# Patient Record
Sex: Female | Born: 1955
Health system: Southern US, Community
[De-identification: ages and names within clinical notes are randomized; demographics above are authoritative.]

## PROBLEM LIST (undated history)

## (undated) DIAGNOSIS — I10 Essential (primary) hypertension: Secondary | ICD-10-CM

## (undated) DIAGNOSIS — H539 Unspecified visual disturbance: Secondary | ICD-10-CM

## (undated) DIAGNOSIS — K529 Noninfective gastroenteritis and colitis, unspecified: Secondary | ICD-10-CM

## (undated) DIAGNOSIS — D6851 Activated protein C resistance: Secondary | ICD-10-CM

## (undated) DIAGNOSIS — G629 Polyneuropathy, unspecified: Secondary | ICD-10-CM

## (undated) DIAGNOSIS — I890 Lymphedema, not elsewhere classified: Secondary | ICD-10-CM

## (undated) DIAGNOSIS — J45909 Unspecified asthma, uncomplicated: Secondary | ICD-10-CM

## (undated) DIAGNOSIS — N189 Chronic kidney disease, unspecified: Secondary | ICD-10-CM

## (undated) DIAGNOSIS — G4733 Obstructive sleep apnea (adult) (pediatric): Secondary | ICD-10-CM

## (undated) DIAGNOSIS — J449 Chronic obstructive pulmonary disease, unspecified: Secondary | ICD-10-CM

## (undated) DIAGNOSIS — I82A19 Acute embolism and thrombosis of unspecified axillary vein: Secondary | ICD-10-CM

## (undated) DIAGNOSIS — Z22322 Carrier or suspected carrier of Methicillin resistant Staphylococcus aureus: Secondary | ICD-10-CM

## (undated) DIAGNOSIS — I350 Nonrheumatic aortic (valve) stenosis: Secondary | ICD-10-CM

## (undated) DIAGNOSIS — Z9989 Dependence on other enabling machines and devices: Secondary | ICD-10-CM

## (undated) HISTORY — DX: Essential (primary) hypertension: I10

## (undated) HISTORY — DX: Carrier or suspected carrier of methicillin resistant Staphylococcus aureus: Z22.322

## (undated) HISTORY — DX: Noninfective gastroenteritis and colitis, unspecified: K52.9

## (undated) HISTORY — DX: Other disorders of calcium metabolism: E83.59

## (undated) HISTORY — DX: Unspecified asthma, uncomplicated: J45.909

## (undated) HISTORY — DX: Chronic obstructive pulmonary disease, unspecified: J44.9

## (undated) HISTORY — DX: Lymphedema, not elsewhere classified: I89.0

## (undated) HISTORY — DX: Obstructive sleep apnea (adult) (pediatric): G47.33

## (undated) HISTORY — DX: Acute embolism and thrombosis of unspecified axillary vein: I82.A19

## (undated) HISTORY — DX: Polyneuropathy, unspecified: G62.9

## (undated) HISTORY — DX: Unspecified visual disturbance: H53.9

## (undated) HISTORY — DX: Activated protein C resistance: D68.51

## (undated) HISTORY — DX: Chronic kidney disease, unspecified: N18.9

## (undated) HISTORY — PX: AV FISTULA PLACEMENT: SHX1204

## (undated) HISTORY — PX: TONSILLECTOMY: SUR1361

## (undated) HISTORY — DX: Dependence on other enabling machines and devices: Z99.89

## (undated) HISTORY — PX: APPLICATION OF WOUND VAC: SHX5189

## (undated) HISTORY — PX: OTHER SURGICAL HISTORY: SHX169

---

## 2010-01-04 DIAGNOSIS — N008 Acute nephritic syndrome with other morphologic changes: Secondary | ICD-10-CM | POA: Insufficient documentation

## 2010-01-04 DIAGNOSIS — N186 End stage renal disease: Secondary | ICD-10-CM | POA: Insufficient documentation

## 2013-08-18 DIAGNOSIS — Z992 Dependence on renal dialysis: Secondary | ICD-10-CM | POA: Insufficient documentation

## 2013-08-18 DIAGNOSIS — N186 End stage renal disease: Secondary | ICD-10-CM | POA: Insufficient documentation

## 2013-08-19 DIAGNOSIS — J449 Chronic obstructive pulmonary disease, unspecified: Secondary | ICD-10-CM | POA: Insufficient documentation

## 2013-08-19 DIAGNOSIS — J45909 Unspecified asthma, uncomplicated: Secondary | ICD-10-CM | POA: Insufficient documentation

## 2013-08-19 DIAGNOSIS — G4730 Sleep apnea, unspecified: Secondary | ICD-10-CM | POA: Insufficient documentation

## 2013-08-19 DIAGNOSIS — J4489 Other specified chronic obstructive pulmonary disease: Secondary | ICD-10-CM | POA: Insufficient documentation

## 2015-03-01 DIAGNOSIS — N186 End stage renal disease: Secondary | ICD-10-CM | POA: Insufficient documentation

## 2015-03-01 DIAGNOSIS — I120 Hypertensive chronic kidney disease with stage 5 chronic kidney disease or end stage renal disease: Secondary | ICD-10-CM | POA: Insufficient documentation

## 2015-03-27 ENCOUNTER — Other Ambulatory Visit: Payer: Self-pay | Admitting: Family Medicine

## 2015-03-27 ENCOUNTER — Other Ambulatory Visit (HOSPITAL_COMMUNITY)
Admission: RE | Admit: 2015-03-27 | Discharge: 2015-03-27 | Disposition: A | Discharge status: Home / Self Care | Payer: Medicare Other | Source: Ambulatory Visit | Attending: Family Medicine | Admitting: Family Medicine

## 2015-03-27 ENCOUNTER — Ambulatory Visit: Payer: Self-pay | Admitting: Neurology

## 2015-03-27 DIAGNOSIS — Z1151 Encounter for screening for human papillomavirus (HPV): Secondary | ICD-10-CM | POA: Insufficient documentation

## 2015-03-27 DIAGNOSIS — Z124 Encounter for screening for malignant neoplasm of cervix: Secondary | ICD-10-CM | POA: Diagnosis present

## 2015-03-29 ENCOUNTER — Ambulatory Visit (INDEPENDENT_AMBULATORY_CARE_PROVIDER_SITE_OTHER): Payer: Medicare Other | Admitting: Neurology

## 2015-03-29 ENCOUNTER — Encounter: Payer: Self-pay | Admitting: Neurology

## 2015-03-29 VITALS — BP 134/88 | HR 64 | Resp 16 | Ht 60.0 in | Wt 253.0 lb

## 2015-03-29 DIAGNOSIS — G2581 Restless legs syndrome: Secondary | ICD-10-CM | POA: Insufficient documentation

## 2015-03-29 DIAGNOSIS — G4700 Insomnia, unspecified: Secondary | ICD-10-CM | POA: Diagnosis not present

## 2015-03-29 DIAGNOSIS — G629 Polyneuropathy, unspecified: Secondary | ICD-10-CM | POA: Diagnosis not present

## 2015-03-29 DIAGNOSIS — G4733 Obstructive sleep apnea (adult) (pediatric): Secondary | ICD-10-CM | POA: Diagnosis not present

## 2015-03-29 DIAGNOSIS — R269 Unspecified abnormalities of gait and mobility: Secondary | ICD-10-CM | POA: Insufficient documentation

## 2015-03-29 DIAGNOSIS — D6851 Activated protein C resistance: Secondary | ICD-10-CM | POA: Insufficient documentation

## 2015-03-29 HISTORY — DX: Obstructive sleep apnea (adult) (pediatric): G47.33

## 2015-03-29 MED ORDER — GABAPENTIN 100 MG PO CAPS: 100.0000 mg | ORAL_CAPSULE | Freq: Four times a day (QID) | ORAL | Status: DC

## 2015-03-29 MED ORDER — ROPINIROLE HCL 2 MG PO TABS: 2.0000 mg | ORAL_TABLET | Freq: Every day | ORAL | Status: DC

## 2015-03-29 NOTE — Progress Notes (Signed)
[facility]  PATIENT: Whitney Chavez DOB: [birth year]  REFERRING DOCTOR OR PCP:  none SOURCE: patient and records from [facility]  _________________________________   HISTORICAL  CHIEF COMPLAINT:  Chief Complaint  Patient presents with  . Sleep Apnea    Sts. she is compliant with cpap.  She repeated her sleep study last yr. and got a new machine at that time.  She gets  her supplies thru [facility].  Sts. RLS and neuropathy are fairly well controlled with Requip and Gabapentin.  She remains on dialysis for renal failure, she sts. secondary to vit. d use./fim  . Restless Leg Syndrome  . Peripheral Neuropathy    HISTORY OF PRESENT ILLNESS:  Whitney Chavez is a 59 year old woman who I have previously seen at [facility] for  Obstructive sleep apnea , insomnia, restless leg syndrome and polyneuropathy.  OSA:  She is sleeping well with CPAP  +10 cm most nights. A download in 2015 showed AHI equals 0.8 with a compliance of 73%. A PSG study February 2015 showed an AHI equals 44.7 with very poor sleep efficiency. Due to the poor sleep efficiency she had AutoPap as an outpatient to avoid another night in the laboratory.   She does not note much sleepiness but may lay nod off for a few minutes around 3 pm and then does well the rest of the day.     Insomnia:  She has sleep maintenance more than sleep onset insomnia. She takes 100 mg gabapentin x 2-3 (occ 4) pills/day. Insomnia is worse night after hemodialysis.  Restless leg syndrome/polyneuropathy:     Restless leg syndrome is usually worse during dialysis days.  Restless leg syndrome has improved on ropinirole and gabapentin.  Ropinirole makes her sleepy so she takes right before bedtime.  She reports numbness up to her ankles and has been stable the past year. . A nerve conduction study/EMG February 2015 showed a length dependent sensory motor axonal polyneuropathy with active features in the lower  leg history she had a superimposed mild-to-moderate right carpal tunnel syndrome.   She has special orthotics that help her foot pain some.    Other: She is on hemodialysis for CRF due to vitamin D toxicity. She has had anemia associated with ESRD.  REVIEW OF SYSTEMS: Constitutional: No fevers, chills, sweats, or change in appetite Eyes: No visual changes, double vision, eye pain Ear, nose and throat: No hearing loss, ear pain, nasal congestion, sore throat Cardiovascular: No chest pain, palpitations Respiratory: No shortness of breath at rest or with exertion.   No wheezes GastrointestinaI: No nausea, vomiting, diarrhea, abdominal pain, fecal incontinence Genitourinary: No dysuria, urinary retention or frequency.  No nocturia. Musculoskeletal: No neck pain, back pain Integumentary: No rash, pruritus, skin lesions Neurological: as above Psychiatric: No depression at this time.  No anxiety Endocrine: No palpitations, diaphoresis, change in appetite, change in weigh or increased thirst Hematologic/Lymphatic: No anemia, purpura, petechiae. Allergic/Immunologic: No itchy/runny eyes, nasal congestion, recent allergic reactions, rashes  ALLERGIES: Allergies  Allergen Reactions  . Other     Unable to take birth control pills due to clotting disorder/fim    HOME MEDICATIONS:  Current outpatient prescriptions:  .  fluticasone (FLONASE) 50 MCG/ACT nasal spray, Place into both nostrils daily., Disp: , Rfl:  .  gabapentin (NEURONTIN) 100 MG capsule, , Disp: , Rfl:  .  metoprolol (LOPRESSOR) 50 MG tablet, , Disp: , Rfl:  .  RENVELA 800 MG tablet, , Disp: , Rfl:  .  rOPINIRole (REQUIP) 2 MG tablet, , Disp: , Rfl:  .  sulfaSALAzine (AZULFIDINE) 500 MG tablet, , Disp: , Rfl:  .  warfarin (COUMADIN) 2 MG tablet, , Disp: , Rfl:  .  warfarin (COUMADIN) 5 MG tablet, , Disp: , Rfl:   PAST MEDICAL HISTORY: Past Medical History  Diagnosis Date  . Obstructive sleep apnea 03/29/2015  . OSA on  CPAP   . Hypertension   . Chronic kidney disease   . Neuropathy   . Vision abnormalities   . MRSA (methicillin resistant staph aureus) culture positive     PAST SURGICAL HISTORY: Past Surgical History  Procedure Laterality Date  . Lumbar decompression fusion    . Av fistula placement Left   . Tonsillectomy      FAMILY HISTORY: Family History  Problem Relation Age of Onset  . Diabetes Mother   . Congestive Heart Failure Mother   . COPD Mother   . Stroke Mother   . Neuropathy Mother   . Heart disease Father   . Stroke Father   . Cancer Brother   . Diabetes Brother   . Diabetes Brother   . Alcohol abuse Brother     SOCIAL HISTORY:  History   Social History  . Marital Status: Single    Spouse Name: N/A  . Number of Children: N/A  . Years of Education: N/A   Occupational History  . Not on file.   Social History Main Topics  . Smoking status: Former Research scientist (life sciences)  . Smokeless tobacco: Not on file  . Alcohol Use: No  . Drug Use: No  . Sexual Activity: Not on file   Other Topics Concern  . Not on file   Social History Narrative  . No narrative on file     PHYSICAL EXAM  Filed Vitals:   03/29/15 1310  BP: 134/88  Pulse: 64  Resp: 16  Height: 5' (1.524 m)  Weight: 253 lb (114.76 kg)    Body mass index is 49.41 kg/(m^2).   General: The patient is well-developed and well-nourished and in no acute distress  Skin: Extremities are with mild edema.  Musculoskeletal:  Back is nontender  Neurologic Exam  Mental status: The patient is alert and oriented x 3 at the time of the examination. The patient has apparent normal recent and remote memory, with an apparently normal attention span and concentration ability.   Speech is normal.  Cranial nerves: Extraocular movements are full. Pupils are equal, round, and reactive to light and accomodation.  Facial symmetry is present. There is good facial sensation to soft touch bilaterally.Facial strength is normal.   Trapezius and sternocleidomastoid strength is normal. No dysarthria is noted.  The tongue is midline, and the patient has symmetric elevation of the soft palate. No obvious hearing deficits are noted.  Motor:  Muscle bulk is normal.   Tone is normal. Strength is  5 / 5 in all 4 extremities except 4/5 EHL and 4+/5 tib ant. .   Sensory: Sensory testing is intact to pinprick, soft touch and vibration sensation in the arms but she has reduced vibration sensation in her knees (absent in feet) and reduced touch/temp in hte lower legs and feet.     Coordination: Cerebellar testing reveals good finger-nose-finger   bilaterally.  Gait and station: Station is normal.   Gait is mildly wide and better if she looks down. Tandem gait is poor. Romberg is positive.   Reflexes: Deep tendon reflexes are 1+ in arms and absent  in knees.        DIAGNOSTIC DATA (LABS, IMAGING, TESTING) - I reviewed patient records, labs, notes, testing and imaging myself where available.     ASSESSMENT AND PLAN  Obstructive sleep apnea  Insomnia  Polyneuropathy  Restless leg  Gait disturbance   1.    Gabapentin 2 400 mg daily  for restless leg, polyneuropathy and insomnia.   Ropinirole for restless legs. 2.   Continue CPAP +10 cm. 3.    Orthotic for both feet for neuropathy..   Prescription provided.  return in 1 year or sooner if there are new or worsening neurologic symptoms.   Shyquan Stallbaumer A. Felecia Shelling, MD, PhD [id number], [id number] PM Certified in Neurology, Clinical Neurophysiology, Sleep Medicine, Pain Medicine and Neuroimaging  [facility] [address], [place], [place] [address] [phone]

## 2015-03-30 LAB — CYTOLOGY - PAP

## 2015-08-16 ENCOUNTER — Ambulatory Visit: Payer: Medicare Other

## 2015-08-16 ENCOUNTER — Ambulatory Visit (HOSPITAL_BASED_OUTPATIENT_CLINIC_OR_DEPARTMENT_OTHER): Payer: Medicare Other | Admitting: Hematology & Oncology

## 2015-08-16 ENCOUNTER — Encounter: Payer: Self-pay | Admitting: Hematology & Oncology

## 2015-08-16 ENCOUNTER — Other Ambulatory Visit: Payer: Medicare Other

## 2015-08-16 VITALS — BP 131/63 | HR 64 | Temp 97.9°F | Resp 16 | Ht 60.0 in | Wt 254.0 lb

## 2015-08-16 DIAGNOSIS — D6851 Activated protein C resistance: Secondary | ICD-10-CM

## 2015-08-16 DIAGNOSIS — Z7901 Long term (current) use of anticoagulants: Secondary | ICD-10-CM | POA: Diagnosis not present

## 2015-08-16 DIAGNOSIS — N186 End stage renal disease: Secondary | ICD-10-CM | POA: Diagnosis not present

## 2015-08-16 DIAGNOSIS — I82A19 Acute embolism and thrombosis of unspecified axillary vein: Secondary | ICD-10-CM | POA: Insufficient documentation

## 2015-08-16 DIAGNOSIS — Z86718 Personal history of other venous thrombosis and embolism: Secondary | ICD-10-CM | POA: Diagnosis not present

## 2015-08-16 DIAGNOSIS — Z992 Dependence on renal dialysis: Secondary | ICD-10-CM

## 2015-08-16 DIAGNOSIS — D6852 Prothrombin gene mutation: Secondary | ICD-10-CM

## 2015-08-16 HISTORY — DX: Acute embolism and thrombosis of unspecified axillary vein: I82.A19

## 2015-08-16 NOTE — Progress Notes (Signed)
Referral MD  Reason for Referral: Recurrent thromboembolic disease secondary to factor V Leiden mutation (heterozygous) and prothrombin II gene mutation.  Chief Complaint  Patient presents with  . OTHER    New Patient  : I need a colonoscopy and need to have my Coumadin adjusted.  HPI: Ms. Sherouse is a very charming 59 year old white female. She is originally from [place]. She is from [place], [place].   She moved down here about a month or so ago. She has end-stage renal disease. She is on hemodialysis. She says this was from taking vitamin D.   She has a history of ulcerative colitis. She is followed by Dr. Paulita Fujita. He wants to plan a surveillance colonoscopy for her. Back she is on lifelong anticoagulation because of her multiple thrombophilic abnormalities.  She was followed up in [place], very closely by Dr. Karlyne Greenspan at the [facility].  Her history dates back to age 25 years old when she was placed onto oral contraceptives. Interestingly enough, at age 37, she got pregnant and gave birth to a healthy baby girl. The birth was full-term area and she's never been pregnant since. She's never had any miscarriages.  When she is placed on oral contraceptives a year later, she developed pulmonary emboli. She was given Coumadin for 6 months.  Her second episode occurred in 2007 when she developed a spontaneous thrombus in the left lower leg.  Then I think in 2014, she developed a thrombus in the right axillary vein. She was then placed on lifelong anticoagulation.  She goes onto heparin whenever she needs a procedure performed. She goes on heparin for about 5 days and then after the procedure, continues the heparin is restarts her Coumadin.  She is doing well with dialysis. She gets hemodialysis. She has an AV fistula in place.  She's had no proximal with leg pain or swelling. She's had no nausea vomiting. She's had no bleeding. Her ulcerative colitis has not flared up. Per she's had no  chest wall pain. She's had no cough. There is no hemoptysis.  She has her Coumadin managed at [facility]. Because she lives close to our office, it was felt that she could have the peri-procedure Coumadin/heparin managed by [place].  She has had no problems with weight loss or weight gain. She has had no rashes. She has had no fever.  Overall, her performance status is ECOG 0.                 Past Medical History  Diagnosis Date  . Obstructive sleep apnea 03/29/2015  . OSA on CPAP   . Hypertension   . Chronic kidney disease   . Neuropathy ([place])   . Vision abnormalities   . MRSA (methicillin resistant staph aureus) culture positive   :  Past Surgical History  Procedure Laterality Date  . Lumbar decompression fusion    . Av fistula placement Left   . Tonsillectomy    :   Current outpatient prescriptions:  .  acetaminophen (TYLENOL) 325 MG tablet, Take 650 mg by mouth., Disp: , Rfl:  .  fluticasone (FLONASE) 50 MCG/ACT nasal spray, Place into both nostrils daily., Disp: , Rfl:  .  gabapentin (NEURONTIN) 100 MG capsule, Take 1 capsule (100 mg total) by mouth 4 (four) times daily., Disp: 360 capsule, Rfl: 3 .  metoprolol (LOPRESSOR) 50 MG tablet, , Disp: , Rfl:  .  omeprazole (PRILOSEC) 20 MG capsule, Take 20 mg by mouth., Disp: , Rfl:  .  RENVELA 800 MG tablet, , Disp: , Rfl:  .  rOPINIRole (REQUIP) 2 MG tablet, Take 1 tablet (2 mg total) by mouth at bedtime., Disp: 90 tablet, Rfl: 3 .  sulfaSALAzine (AZULFIDINE) 500 MG tablet, , Disp: , Rfl:  .  warfarin (COUMADIN) 2 MG tablet, , Disp: , Rfl:  .  warfarin (COUMADIN) 5 MG tablet, , Disp: , Rfl: :  :  Allergies  Allergen Reactions  . Other     Unable to take birth control pills due to clotting disorder/fim  :  Family History  Problem Relation Age of Onset  . Diabetes Mother   . Congestive Heart Failure Mother   . COPD Mother   . Stroke Mother   . Neuropathy Mother   . Heart disease Father   . Stroke  Father   . Cancer Brother   . Diabetes Brother   . Diabetes Brother   . Alcohol abuse Brother   :  Social History   Social History  . Marital Status: Single    Spouse Name: N/A  . Number of Children: N/A  . Years of Education: N/A   Occupational History  . Not on file.   Social History Main Topics  . Smoking status: Former Research scientist (life sciences)  . Smokeless tobacco: Not on file  . Alcohol Use: No  . Drug Use: No  . Sexual Activity: Not on file   Other Topics Concern  . Not on file   Social History Narrative  :  Pertinent items are noted in HPI.  Exam: @IPVITALS @  obese white female in no obvious distress. Her vital signs show a temperature of 97.9. Pulse 64. Blood pressure 131/63. Weight is 254 pounds. Head and neck exam shows no ocular or oral lesions. There are no palpable cervical or supraclavicular lymph nodes. Lungs are clear. Cardiac exam regular rate and rhythm with no murmurs, rubs or bruits. Abdomen is soft. She is obese. She has good bowel sounds. There is no fluid wave. There is no palpable liver or spleen tip. Back exam shows no tenderness over the spine, ribs or hips. Extremities shows no clubbing, cyanosis or edema. She has a AV fistula in the left arm. No venous cord is noted in the legs. She has good range motion of her joints. Skin exam shows no rashes, ecchymoses or petechia. Neurological exam shows no focal neurological deficits.   No results for input(s): WBC, HGB, HCT, PLT in the last 72 hours. No results for input(s): NA, K, CL, CO2, GLUCOSE, BUN, CREATININE, CALCIUM in the last 72 hours.  Blood smear review:  None  Pathology: None     Assessment and Plan:  Ms. Towsend is a very charming 59 year old white female. She has multiple thrombophilic conditions. She's had recurrent thromboembolisms. She is on lifelong anticoagulation.  She has done well with heparin in the past. She is limited to what she can take because of the renal failure and hemodialysis.  We will  go ahead and give her a prescription for heparin at 5000 units subcutaneous 3 times a day for 5 days before the procedure and then start the day after the procedure for 5 days. I told her to restart the Coumadin the day after her procedure.  This is what she has done in the past and it seems to have worked pretty well for her.  Otherwise, I do still think we have to see her back. Her Coumadin is being managed by a physician at [facility]. We will be  1 having to help out in any other way if she has problems in the future.  I spent about 45 minutes with her.  It was a lot of fun talking with her about [place].

## 2016-01-17 DIAGNOSIS — Z7901 Long term (current) use of anticoagulants: Secondary | ICD-10-CM | POA: Insufficient documentation

## 2016-01-17 DIAGNOSIS — D6851 Activated protein C resistance: Secondary | ICD-10-CM | POA: Insufficient documentation

## 2016-02-19 ENCOUNTER — Encounter: Payer: Self-pay | Admitting: *Deleted

## 2016-03-27 ENCOUNTER — Ambulatory Visit (INDEPENDENT_AMBULATORY_CARE_PROVIDER_SITE_OTHER): Payer: Medicare Other | Admitting: Neurology

## 2016-03-27 ENCOUNTER — Encounter: Payer: Self-pay | Admitting: Neurology

## 2016-03-27 VITALS — BP 118/60 | HR 78 | Resp 20 | Ht 61.0 in | Wt 256.0 lb

## 2016-03-27 DIAGNOSIS — G4733 Obstructive sleep apnea (adult) (pediatric): Secondary | ICD-10-CM | POA: Diagnosis not present

## 2016-03-27 DIAGNOSIS — G629 Polyneuropathy, unspecified: Secondary | ICD-10-CM | POA: Diagnosis not present

## 2016-03-27 DIAGNOSIS — G4700 Insomnia, unspecified: Secondary | ICD-10-CM | POA: Diagnosis not present

## 2016-03-27 DIAGNOSIS — G2581 Restless legs syndrome: Secondary | ICD-10-CM | POA: Diagnosis not present

## 2016-03-27 DIAGNOSIS — R269 Unspecified abnormalities of gait and mobility: Secondary | ICD-10-CM

## 2016-03-27 MED ORDER — ROPINIROLE HCL 2 MG PO TABS: 2.0000 mg | ORAL_TABLET | Freq: Every day | ORAL | Status: DC

## 2016-03-27 MED ORDER — GABAPENTIN 100 MG PO CAPS: 100.0000 mg | ORAL_CAPSULE | Freq: Four times a day (QID) | ORAL | Status: DC

## 2016-03-27 MED ORDER — ROPINIROLE HCL 0.5 MG PO TABS: ORAL_TABLET | ORAL | Status: DC

## 2016-03-27 NOTE — Progress Notes (Signed)
[facility]  PATIENT: Whitney Chavez DOB: 1956/08/31  REFERRING DOCTOR OR PCP:  none SOURCE: patient and records from [facility]  _________________________________   HISTORICAL  CHIEF COMPLAINT:  Chief Complaint  Patient presents with  . Sleep Apnea  . RLS  . Peripheral Neuropathy    HISTORY OF PRESENT ILLNESS:  Whitney Chavez is a 60 year old woman with Obstructive sleep apnea , insomnia, restless leg syndrome and polyneuropathy.    She is having more difficulty getting around her house to perform activities of daily living and would benefit from a power vehicle such as a scooter.  Mobility:   She has gait disturbance due to polyneuropathy and has occasional falls.  She is currently using a cane but is still having significant issues and occasional falls.    Due to foot weakness and fatigue, a cane or walker does not meet mobility needs to do ADL's in her home.    Due to obesity and fatigue, she can't use a self propelled wheelchair.   Therefore a power vehicle is necessary.    A scooter will be able to help her move inside of her home from room to room to do her ADLs such as laundry, meal preparation toileting and other self care which are now difficult to perform.    She will be able to operate a scooter within her home.  OSA:  She is sleeping well with CPAP  +10 cm every night.   A PSG study February 2015 showed an AHI equals 44.7 with very poor sleep efficiency. Due to the poor sleep efficiency she had AutoPap as an outpatient to avoid another night in the laboratory.   She does not note much sleepiness but may lay nod off for a few minutes around 3 pm and then does well the rest of the day.     Insomnia:  She has sleep maintenance more than sleep onset insomnia. She takes 400 mg gabapentin with benefit. Insomnia is worse night after hemodialysis.  Restless leg syndrome/polyneuropathy:     Restless leg syndrome is a little worse, especially on dialysis  days (has CRF).  In the past RLS was only after HD or in the evening and now it occurs during the day.   Restless leg syndrome has improved on ropinirole and gabapentin (400 mg at night).  Ropinirole makes her sleepy so she takes right before bedtime.  She has polyneuropathy with numbness up to her ankles and has been stable the past year. . A nerve conduction study/EMG February 2015 showed a length dependent sensory motor axonal polyneuropathy with active features in the lower leg history she had a superimposed mild-to-moderate right carpal tunnel syndrome.   She has special orthotics that help her foot pain some.    Other: She is on hemodialysis for CRF due to vitamin D toxicity. She has had anemia associated with ESRD.  REVIEW OF SYSTEMS: Constitutional: No fevers, chills, sweats, or change in appetite Eyes: No visual changes, double vision, eye pain Ear, nose and throat: No hearing loss, ear pain, nasal congestion, sore throat Cardiovascular: No chest pain, palpitations Respiratory: No shortness of breath at rest or with exertion.   No wheezes GastrointestinaI: No nausea, vomiting, diarrhea, abdominal pain, fecal incontinence Genitourinary: No dysuria, urinary retention or frequency.  No nocturia. Musculoskeletal: No neck pain, back pain Integumentary: No rash, pruritus, skin lesions Neurological: as above Psychiatric: No depression at this time.  No anxiety Endocrine: No palpitations, diaphoresis, change in appetite, change in weigh  or increased thirst Hematologic/Lymphatic: No anemia, purpura, petechiae. Allergic/Immunologic: No itchy/runny eyes, nasal congestion, recent allergic reactions, rashes  ALLERGIES: Allergies  Allergen Reactions  . Estrogens Other (See Comments)    Blood clots  . Other     Unable to take birth control pills due to clotting disorder/fim  . Prednisone Other (See Comments)    Facial edema     HOME MEDICATIONS:  Current outpatient prescriptions:  .   acetaminophen (TYLENOL) 325 MG tablet, Take 650 mg by mouth., Disp: , Rfl:  .  gabapentin (NEURONTIN) 100 MG capsule, Take 1 capsule (100 mg total) by mouth 4 (four) times daily., Disp: 360 capsule, Rfl: 3 .  omeprazole (PRILOSEC) 20 MG capsule, Take 20 mg by mouth., Disp: , Rfl:  .  OVER THE COUNTER MEDICATION, , Disp: , Rfl:  .  RENVELA 800 MG tablet, , Disp: , Rfl:  .  rOPINIRole (REQUIP) 2 MG tablet, Take 1 tablet (2 mg total) by mouth at bedtime., Disp: 90 tablet, Rfl: 3 .  sulfaSALAzine (AZULFIDINE) 500 MG tablet, , Disp: , Rfl:  .  warfarin (COUMADIN) 2 MG tablet, , Disp: , Rfl:  .  warfarin (COUMADIN) 5 MG tablet, , Disp: , Rfl:  .  metoprolol (LOPRESSOR) 50 MG tablet, Reported on 03/27/2016, Disp: , Rfl:   PAST MEDICAL HISTORY: Past Medical History  Diagnosis Date  . Obstructive sleep apnea 03/29/2015  . OSA on CPAP   . Hypertension   . Chronic kidney disease   . Neuropathy ([place])   . Vision abnormalities   . MRSA (methicillin resistant staph aureus) culture positive   . Axillary vein thrombosis 08/16/2015    PAST SURGICAL HISTORY: Past Surgical History  Procedure Laterality Date  . Lumbar decompression fusion    . Av fistula placement Left   . Tonsillectomy      FAMILY HISTORY: Family History  Problem Relation Age of Onset  . Diabetes Mother   . Congestive Heart Failure Mother   . COPD Mother   . Stroke Mother   . Neuropathy Mother   . Heart disease Father   . Stroke Father   . Cancer Brother   . Diabetes Brother   . Diabetes Brother   . Alcohol abuse Brother     SOCIAL HISTORY:  Social History   Social History  . Marital Status: Single    Spouse Name: N/A  . Number of Children: N/A  . Years of Education: N/A   Occupational History  . Not on file.   Social History Main Topics  . Smoking status: Former Research scientist (life sciences)  . Smokeless tobacco: Not on file  . Alcohol Use: No  . Drug Use: No  . Sexual Activity: Not on file   Other Topics Concern  . Not on  file   Social History Narrative     PHYSICAL EXAM  Filed Vitals:   03/27/16 1106  BP: 118/60  Pulse: 78  Resp: 20  Height: 5\' 1"  (1.549 m)  Weight: 256 lb (116.121 kg)    Body mass index is 48.4 kg/(m^2).   General: The patient is well-developed and well-nourished and in no acute distress  Skin: Extremities are with mild edema.  Musculoskeletal:  Back is nontender  Neurologic Exam  Mental status: The patient is alert and oriented x 3 at the time of the examination. The patient has apparent normal recent and remote memory, with an apparently normal attention span and concentration ability.   Speech is normal.  Cranial nerves: Extraocular  movements are full. Pupils are equal, round, and reactive to light and accomodation.  Facial symmetry is present. There is good facial sensation to soft touch bilaterally.Facial strength is normal.  Trapezius and sternocleidomastoid strength is normal. No dysarthria is noted.  The tongue is midline, and the patient has symmetric elevation of the soft palate. No obvious hearing deficits are noted.  Motor:  Muscle bulk is normal.   Tone is normal. Strength is  5 / 5 in all 4 extremities except 4/5 EHL and 4+/5 tib ant. .   Sensory: Sensory testing is intact to pinprick, soft touch and vibration sensation in the arms but she has reduced vibration sensation in her knees (absent in feet) and reduced touch/temp in hte lower legs and feet.     Coordination: Cerebellar testing reveals good finger-nose-finger   bilaterally.  Gait and station: Station is normal.   Gait is mildly wide and better if she looks down. Tandem gait is poor. Romberg is positive.   Reflexes: Deep tendon reflexes are 1+ in arms and absent in knees.        DIAGNOSTIC DATA (LABS, IMAGING, TESTING) - I reviewed patient records, labs, notes, testing and imaging myself where available.     ASSESSMENT AND PLAN  Polyneuropathy ([place])  Obstructive sleep apnea  Gait  disturbance  Insomnia  Restless leg   1.    Prescription for a power scooter was provided. A power scooter for allowing her to safely perform activities of daily living inside her home. 2.   Gabapentin  400 mg daily  for restless leg, polyneuropathy and insomnia.   Increase Ropinirole for restless legs by adding daytime dose (esp on HD days) 3.   Continue CPAP +10 cm. 4.   Orthotic for both feet for neuropathy.    Prescription provided. 5.   return in 1 year or sooner if there are new or worsening neurologic symptoms.  45 minute face to face with greater than 1/2 the time counseling and coordinating care for her mobility and other issues   Ameenah Prosser A. Felecia Shelling, MD, PhD [id number], [id number] AM Certified in Neurology, Clinical Neurophysiology, Sleep Medicine, Pain Medicine and Neuroimaging  [facility] [address], [place], [place] [address] [phone]

## 2016-04-16 ENCOUNTER — Ambulatory Visit (HOSPITAL_BASED_OUTPATIENT_CLINIC_OR_DEPARTMENT_OTHER): Payer: Medicare Other | Admitting: Hematology & Oncology

## 2016-04-16 ENCOUNTER — Encounter: Payer: Self-pay | Admitting: Hematology & Oncology

## 2016-04-16 ENCOUNTER — Other Ambulatory Visit: Payer: Medicare Other

## 2016-04-16 VITALS — BP 123/60 | HR 86 | Temp 97.9°F | Resp 18 | Ht 61.0 in | Wt 254.1 lb

## 2016-04-16 DIAGNOSIS — Z992 Dependence on renal dialysis: Secondary | ICD-10-CM

## 2016-04-16 DIAGNOSIS — D6851 Activated protein C resistance: Secondary | ICD-10-CM | POA: Diagnosis present

## 2016-04-16 DIAGNOSIS — I749 Embolism and thrombosis of unspecified artery: Secondary | ICD-10-CM

## 2016-04-16 NOTE — Progress Notes (Signed)
Hematology and Oncology Follow Up Visit  Aquita Quinnell [id number] 1956/02/21 60 y.o. 04/16/2016   Principle Diagnosis:   Recurrent thromboembolic disease  Heterozygous factor V Leiden mutation  Prothrombin II gene mutation  Current Therapy:    Lifelong Coumadin.     Interim History:  Ms. Krimmel is back for a quick visit. I first saw her back in December 2016. At that point time, she was have a colonoscopy and the causes, needed to get off Coumadin and onto heparin.  She never had this done. She now is going to have it done.  Since we last saw her, she's been doing okay. She gets her dialysis 3 times a week. She does have ulcerative colitis. This is why the colonoscopy is being done for surveillance purposes.  She's had no problems with the Coumadin. This is being managed by her local doctor.  She's had no issues with bleeding or bruising. She has no leg swelling. She has no increased cough.  As always, she denied talking about [place]. He was found talking to her about [place].  Her appetite is doing okay. She's had no nausea or vomiting. She's had no diarrhea. She's had no fever. There's been no issues with infections.  Overall, her performance status is ECOG 1.  Medications:  Current Outpatient Prescriptions:  .  acetaminophen (TYLENOL) 325 MG tablet, Take 650 mg by mouth., Disp: , Rfl:  .  ADVAIR DISKUS 100-50 MCG/DOSE AEPB, , Disp: , Rfl:  .  gabapentin (NEURONTIN) 100 MG capsule, Take 1 capsule (100 mg total) by mouth 4 (four) times daily., Disp: 360 capsule, Rfl: 3 .  omeprazole (PRILOSEC) 20 MG capsule, Take 20 mg by mouth., Disp: , Rfl:  .  RENVELA 800 MG tablet, , Disp: , Rfl:  .  rOPINIRole (REQUIP) 0.5 MG tablet, Take 1-2 tablets in the morning po., Disp: 180 tablet, Rfl: 3 .  rOPINIRole (REQUIP) 2 MG tablet, Take 1 tablet (2 mg total) by mouth at bedtime., Disp: 90 tablet, Rfl: 3 .  sulfaSALAzine (AZULFIDINE) 500 MG tablet, , Disp: , Rfl:  .  warfarin (COUMADIN) 1  MG tablet, , Disp: , Rfl:  .  warfarin (COUMADIN) 5 MG tablet, , Disp: , Rfl:   Allergies:  Allergies  Allergen Reactions  . Estrogens Other (See Comments)    Blood clots  . Other     Unable to take birth control pills due to clotting disorder/fim  . Prednisone Other (See Comments)    Facial edema     Past Medical History, Surgical history, Social history, and Family History were reviewed and updated.  Review of Systems:  As above  Physical Exam:  height is 5\' 1"  (1.549 m) and weight is 254 lb 1.3 oz (115.2 kg). Her oral temperature is 97.9 F (36.6 C). Her blood pressure is 123/60 and her pulse is 86. Her respiration is 18.   Wt Readings from Last 3 Encounters:  04/16/16 254 lb 1.3 oz (115.2 kg)  03/27/16 256 lb (116.1 kg)  08/16/15 254 lb (115.2 kg)     Head and neck exam shows no ocular or oral lesions. There are no palpable cervical or supraclavicular lymph nodes. Lungs are clear. Cardiac exam regular rate and rhythm with no murmurs, rubs or bruits. Abdomen is soft. She is obese. She has good bowel sounds. There is no fluid wave. There is no palpable liver or spleen tip. Back exam shows no tenderness over the spine, ribs or hips. Extremities shows no clubbing, cyanosis  or edema. She has a AV fistula in the left arm. No venous cord is noted in the legs. She has good range motion of her joints. Skin exam shows no rashes, ecchymoses or petechia. Neurological exam shows no focal neurological deficits.   No results found for: WBC, HGB, HCT, MCV, PLT   Chemistry   No results found for: NA, K, CL, CO2, BUN, CREATININE, GLU No results found for: CALCIUM, ALKPHOS, AST, ALT, BILITOT       Impression and Plan: Ms. Spruill is  A 60 year old white female. She has history of recurrent thrombosis. She has both factor V Leiden and prothrombin II G mutation. She is on lifelong Coumadin.  I gave her another prescription for the heparin that she'll take pre-procedure and post procedure. His  heparin for 5 days both before and after the colonoscopy. It's 3 times a day heparin.  For now, I do still think we have to get her back to see [place]. She can always come back if she has any issues with bleeding or any procedures.   Volanda Napoleon, MD 8/2/20175:35 PM

## 2016-04-28 ENCOUNTER — Other Ambulatory Visit: Payer: Self-pay | Admitting: Nurse Practitioner

## 2016-04-30 ENCOUNTER — Encounter: Payer: Self-pay | Admitting: *Deleted

## 2016-04-30 ENCOUNTER — Telehealth: Payer: Self-pay | Admitting: *Deleted

## 2016-04-30 NOTE — Telephone Encounter (Signed)
Clarification from Dr. Marin Olp about patient Whitney Chavez syringes.  Patient to get 5000u per cc 3x/day x 5 day pre procedure and then 3x /day post procedure.  Patien tto stop coumadin prior to procedure and then to get Coumadin check 7 days from when she started.

## 2016-05-08 ENCOUNTER — Other Ambulatory Visit: Payer: Self-pay | Admitting: Gastroenterology

## 2017-03-31 ENCOUNTER — Encounter: Payer: Self-pay | Admitting: Neurology

## 2017-03-31 ENCOUNTER — Ambulatory Visit (INDEPENDENT_AMBULATORY_CARE_PROVIDER_SITE_OTHER): Payer: Medicare Other | Admitting: Neurology

## 2017-03-31 VITALS — BP 104/53 | HR 80 | Resp 20 | Ht 61.0 in | Wt 260.0 lb

## 2017-03-31 DIAGNOSIS — G4733 Obstructive sleep apnea (adult) (pediatric): Secondary | ICD-10-CM

## 2017-03-31 DIAGNOSIS — G629 Polyneuropathy, unspecified: Secondary | ICD-10-CM

## 2017-03-31 DIAGNOSIS — R269 Unspecified abnormalities of gait and mobility: Secondary | ICD-10-CM | POA: Diagnosis not present

## 2017-03-31 DIAGNOSIS — G2581 Restless legs syndrome: Secondary | ICD-10-CM | POA: Diagnosis not present

## 2017-03-31 DIAGNOSIS — G4700 Insomnia, unspecified: Secondary | ICD-10-CM

## 2017-03-31 MED ORDER — ROPINIROLE HCL 2 MG PO TABS: 2.0000 mg | ORAL_TABLET | Freq: Every day | ORAL | 3 refills | Status: DC

## 2017-03-31 MED ORDER — GABAPENTIN 100 MG PO CAPS: 100.0000 mg | ORAL_CAPSULE | Freq: Four times a day (QID) | ORAL | 3 refills | Status: DC

## 2017-03-31 MED ORDER — ROPINIROLE HCL 0.5 MG PO TABS: ORAL_TABLET | ORAL | 3 refills | Status: DC

## 2017-03-31 NOTE — Progress Notes (Signed)
[facility]  PATIENT: Whitney Chavez DOB: Jun 19, 1956  REFERRING DOCTOR OR PCP:  none SOURCE: patient and records from [facility]  _________________________________   HISTORICAL  CHIEF COMPLAINT:  Chief Complaint  Patient presents with  . Polyneuropathy    Sts. numbness in her feet is some worse.  Sts. she is compliant with CPAP.  Sts. added daily dose of Requip helps RLS "immensely."  Believes sleep is about the same.  Good nights and bad/fim  . Sleep Apnea  . RLS  . Insomnia    HISTORY OF PRESENT ILLNESS:  Whitney Chavez is a 61 year old woman with Obstructive sleep apnea , insomnia, restless leg syndrome and polyneuropathy.    She is having more difficulty getting around her house to perform activities of daily living and would benefit from a power vehicle such as a scooter.  Restless leg syndrome/polyneuropathy:  She gets RLS, worse with HD.   A combination of gabapentin with ropinirole have greatly helped.   Her RLS was severe and occurred day and night, worse after each HD session.   She feels she sleeps better with the RLS med's.     She has polyneuropathy with numbness up to her ankles and has been stable the past year. . A nerve conduction study/EMG February 2015 showed a length dependent sensory motor axonal polyneuropathy with active features in the lower leg history she had a superimposed mild-to-moderate right carpal tunnel syndrome.   She has special orthotics that help her foot pain some.    Gait:   Due to the polyneuropathy, she has a gait disturbance and has occasional falls. She uses a cane but has difficulty walking longer distances   OSA:  She is sleeping well with CPAP  +10 cm every night.   A PSG study February 2015 showed an AHI equals 44.7 with very poor sleep efficiency. Due to the poor sleep efficiency she had AutoPap as an outpatient to avoid another night in the laboratory.   She does not note much sleepiness but may lay nod off for  a few minutes around 3 pm and then does well the rest of the day.     Insomnia:  She is sleeping much better on the combination of CPAP, gabapentin and ropinirole.  She has sleep maintenance more than sleep onset insomnia. She takes 400 mg gabapentin with benefit. Insomnia is worse night after hemodialysis.  Other: She is on hemodialysis for CRF due to vitamin D toxicity. She has had anemia associated with ESRD.    She has painful lumps in her skin and subcutaneous tissue felt due to the Aranesp/Epogen.     REVIEW OF SYSTEMS: Constitutional: No fevers, chills, sweats, or change in appetite Eyes: No visual changes, double vision, eye pain Ear, nose and throat: No hearing loss, ear pain, nasal congestion, sore throat Cardiovascular: No chest pain, palpitations Respiratory: No shortness of breath at rest or with exertion.   No wheezes GastrointestinaI: No nausea, vomiting, diarrhea, abdominal pain, fecal incontinence Genitourinary: She is on hemodialysis. Musculoskeletal: No neck pain, back pain Integumentary: No rash, pruritus, skin lesions Neurological: as above Psychiatric: No depression at this time.  No anxiety Endocrine: No palpitations, diaphoresis, change in appetite, change in weigh or increased thirst Hematologic/Lymphatic: No anemia, purpura, petechiae. Allergic/Immunologic: No itchy/runny eyes, nasal congestion, recent allergic reactions, rashes  ALLERGIES: Allergies  Allergen Reactions  . Estrogens Other (See Comments)    Blood clots  . Other     Unable to take birth control  pills due to clotting disorder/fim  . Prednisone Other (See Comments)    Facial edema     HOME MEDICATIONS:  Current Outpatient Prescriptions:  .  acetaminophen (TYLENOL) 325 MG tablet, Take 650 mg by mouth., Disp: , Rfl:  .  ADVAIR DISKUS 100-50 MCG/DOSE AEPB, , Disp: , Rfl:  .  gabapentin (NEURONTIN) 100 MG capsule, Take 1 capsule (100 mg total) by mouth 4 (four) times daily., Disp: 360  capsule, Rfl: 3 .  lanthanum (FOSRENOL) 1000 MG chewable tablet, Chew 2,000 mg by mouth., Disp: , Rfl:  .  omeprazole (PRILOSEC) 20 MG capsule, Take 20 mg by mouth daily., Disp: , Rfl:  .  rOPINIRole (REQUIP) 0.5 MG tablet, Take 1-2 tablets in the morning po., Disp: 180 tablet, Rfl: 3 .  rOPINIRole (REQUIP) 2 MG tablet, Take 1 tablet (2 mg total) by mouth at bedtime., Disp: 90 tablet, Rfl: 3 .  sulfaSALAzine (AZULFIDINE) 500 MG tablet, , Disp: , Rfl:  .  warfarin (COUMADIN) 5 MG tablet, , Disp: , Rfl:   PAST MEDICAL HISTORY: Past Medical History:  Diagnosis Date  . 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 Chronic kidney disease   . Hypertension   . MRSA (methicillin resistant staph aureus) culture positive   . Neuropathy   . Obstructive sleep apnea 03/29/2015  . OSA on CPAP   . Vision abnormalities     PAST SURGICAL HISTORY: Past Surgical History:  Procedure Laterality Date  . AV FISTULA PLACEMENT Left   . lumbar decompression fusion    . TONSILLECTOMY      FAMILY HISTORY: Family History  Problem Relation Age of Onset  . Diabetes Mother   . Congestive Heart Failure Mother   . COPD Mother   . Stroke Mother   . Neuropathy Mother   . Heart disease Father   . Stroke Father   . Cancer Brother   . Diabetes Brother   . Diabetes Brother   . Alcohol abuse Brother     SOCIAL HISTORY:  Social History   Social History  . Marital status: Single    Spouse name: N/A  . Number of children: N/A  . Years of education: N/A   Occupational History  . Not on file.   Social History Main Topics  . Smoking status: Former Research scientist (life sciences)  . Smokeless tobacco: Never Used  . Alcohol use No  . Drug use: No  . Sexual activity: Not on file   Other Topics Concern  . Not on file   Social History Narrative  . No narrative on file     PHYSICAL EXAM  Vitals:   03/31/17 1102  BP: (!) 104/53  Pulse: 80  Resp: 20  Weight: 260 lb (117.9 kg)  Height: 5\' 1"  (1.549 m)    Body mass  index is 49.13 kg/m.   General: The patient is well-developed and well-nourished and in no acute distress   Neurologic Exam  Mental status: The patient is alert and oriented x 3 at the time of the examination. The patient has apparent normal recent and remote memory, with an apparently normal attention span and concentration ability.   Speech is normal.  Cranial nerves: Extraocular muscles are intact. Facial strength and sensation is normal.  The tongue is midline, and the patient has symmetric elevation of the soft palate. No obvious hearing deficits are noted.  Motor:  Muscle bulk is normal.   Tone is normal. Strength is normal in the arms. Strength is 4/5 in the  extensor hallucis longus muscles and 4+/5 in the tibialis anterior bilaterally. .   Sensory: On sensory exam she has intact sensation to touch and vibration in the arms. She has reduced sensation to vibration at the knees and absent sensation at the ankles. She has reduced sensation to touch in the lower legs and worse sensation in the feet.   Coordination: Cerebellar testing reveals good finger-nose-finger bilaterally.  Heel-to-shin is poor.  Gait and station: Station is normal.   Gait is mildly wide and better if she looks down. She cannot tandem walk. Romberg is positive.   Reflexes: Deep tendon reflexes are 1+ in arms and absent in knees and ankles.        DIAGNOSTIC DATA (LABS, IMAGING, TESTING) - I reviewed patient records, labs, notes, testing and imaging myself where available.     ASSESSMENT AND PLAN  Polyneuropathy  Obstructive sleep apnea  Restless leg  Insomnia, unspecified type  Gait disturbance   1.    For her polyneuropathy, restless leg insomnia we will continue gabapentin 400 mg daily. The dose is intentionally low as she has end-stage renal disease. We will also continue ropinirole for restless leg syndrome and she will add an additional dose on hemodialysis days. 2.   She will continue CPAP  +10 cm 3.   return in 1 year or sooner if there are new or worsening neurologic symptoms.     Briarrose Shor A. Felecia Shelling, MD, PhD, Charlynn Grimes  5/88/3254, 9:82 PM Certified in Neurology, Clinical Neurophysiology, Sleep Medicine, Pain Medicine and Neuroimaging  [facility] [address] [place], [place] [address] [phone]

## 2017-04-28 DIAGNOSIS — M793 Panniculitis, unspecified: Secondary | ICD-10-CM | POA: Insufficient documentation

## 2017-05-14 ENCOUNTER — Telehealth: Payer: Self-pay | Admitting: Neurology

## 2017-05-14 NOTE — Telephone Encounter (Signed)
LMOM (identified vm) that it is ok to take Amitriptyline.  She can call back if she has any other questions/fim

## 2017-05-14 NOTE — Telephone Encounter (Signed)
Pt called she has been dx with dercums disease and has been prescribed amitriptylene. She wants to know if there will be any problems with that medication and the ones she is already taking. Please call

## 2017-06-18 DIAGNOSIS — Z4902 Encounter for fitting and adjustment of peritoneal dialysis catheter: Secondary | ICD-10-CM | POA: Insufficient documentation

## 2017-06-25 ENCOUNTER — Ambulatory Visit (HOSPITAL_BASED_OUTPATIENT_CLINIC_OR_DEPARTMENT_OTHER): Payer: Medicare Other | Admitting: Hematology & Oncology

## 2017-06-25 ENCOUNTER — Ambulatory Visit (HOSPITAL_BASED_OUTPATIENT_CLINIC_OR_DEPARTMENT_OTHER): Payer: Medicare Other

## 2017-06-25 ENCOUNTER — Telehealth: Payer: Self-pay | Admitting: *Deleted

## 2017-06-25 VITALS — BP 137/62 | HR 80 | Temp 98.2°F | Resp 20 | Wt 254.5 lb

## 2017-06-25 DIAGNOSIS — E882 Lipomatosis, not elsewhere classified: Secondary | ICD-10-CM

## 2017-06-25 DIAGNOSIS — I82A19 Acute embolism and thrombosis of unspecified axillary vein: Secondary | ICD-10-CM

## 2017-06-25 DIAGNOSIS — Z7901 Long term (current) use of anticoagulants: Secondary | ICD-10-CM | POA: Diagnosis not present

## 2017-06-25 DIAGNOSIS — D6851 Activated protein C resistance: Secondary | ICD-10-CM

## 2017-06-25 LAB — CBC WITH DIFFERENTIAL (CANCER CENTER ONLY)
BASO#: 0 10*3/uL (ref 0.0–0.2)
BASO%: 0.9 % (ref 0.0–2.0)
EOS%: 5.4 % (ref 0.0–7.0)
Eosinophils Absolute: 0.2 10*3/uL (ref 0.0–0.5)
HCT: 32.3 % — ABNORMAL LOW (ref 34.8–46.6)
HGB: 9.9 g/dL — ABNORMAL LOW (ref 11.6–15.9)
LYMPH#: 1.2 10*3/uL (ref 0.9–3.3)
LYMPH%: 26.8 % (ref 14.0–48.0)
MCH: 28.3 pg (ref 26.0–34.0)
MCHC: 30.7 g/dL — ABNORMAL LOW (ref 32.0–36.0)
MCV: 92 fL (ref 81–101)
MONO#: 0.4 10*3/uL (ref 0.1–0.9)
MONO%: 8.7 % (ref 0.0–13.0)
NEUT#: 2.6 10*3/uL (ref 1.5–6.5)
NEUT%: 58.2 % (ref 39.6–80.0)
Platelets: 202 10*3/uL (ref 145–400)
RBC: 3.5 10*6/uL — ABNORMAL LOW (ref 3.70–5.32)
RDW: 16.4 % — ABNORMAL HIGH (ref 11.1–15.7)
WBC: 4.5 10*3/uL (ref 3.9–10.0)

## 2017-06-25 LAB — CMP (CANCER CENTER ONLY)
ALT(SGPT): 14 U/L (ref 10–47)
AST: 28 U/L (ref 11–38)
Albumin: 3.6 g/dL (ref 3.3–5.5)
Alkaline Phosphatase: 60 U/L (ref 26–84)
BUN, Bld: 27 mg/dL — ABNORMAL HIGH (ref 7–22)
CO2: 31 mEq/L (ref 18–33)
Calcium: 9.7 mg/dL (ref 8.0–10.3)
Chloride: 103 mEq/L (ref 98–108)
Creat: 4.7 mg/dl (ref 0.6–1.2)
Glucose, Bld: 119 mg/dL — ABNORMAL HIGH (ref 73–118)
Potassium: 4.1 mEq/L (ref 3.3–4.7)
Sodium: 144 mEq/L (ref 128–145)
Total Bilirubin: 0.6 mg/dl (ref 0.20–1.60)
Total Protein: 7 g/dL (ref 6.4–8.1)

## 2017-06-25 NOTE — Telephone Encounter (Signed)
Critical Value Creatinine 4.7 Dr Ennever notified. No orders at this time 

## 2017-06-25 NOTE — Progress Notes (Signed)
Hematology and Oncology Follow Up Visit  Whitney Chavez [id number] [birth year] 61 y.o. 06/25/2017   Principle Diagnosis:   Recurrent thromboembolic disease  Heterozygous factor V Leiden mutation  Prothrombin II gene mutation  Current Therapy:    Lifelong Coumadin.     Interim History:  Whitney Chavez is back for follow-up. She was supposed to have a peritoneal dialysis catheter placed at [facility]. I got called by a internist at [facility] who does preop testing. He wanted to talk to me about her anticoagulation.  Whitney Chavez says that she is not going back to [place] because it is filthy and non-sanitary. She has had MRSA in the past. She does not want MRSA again.  She has been off the transplant list for her kidneys. Hopefully she will be able to get back onto the transplant list. She is to have peritoneal dialysis. As such, she was to have placement of a Tenckhoff catheter.  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 This is a condition and which there is the development of painful subcutaneous growths of adipose tissue. This adipose tissue is incredibly hard. She has had this biopsied.  She otherwise, seems to be doing okay.  Her appetite is good. She is trying to lose a little bit of weight. Per graphic she's had no bleeding. She is on Coumadin. Her last INR was 2.38.  It was really not to see her again. Overall her performance status is ECOG 1.  Medications:  Current Outpatient Prescriptions:  .  acetaminophen (TYLENOL) 325 MG tablet, Take 650 mg by mouth., Disp: , Rfl:  .  ADVAIR DISKUS 100-50 MCG/DOSE AEPB, , Disp: , Rfl:  .  gabapentin (NEURONTIN) 100 MG capsule, Take 1 capsule (100 mg total) by mouth 4 (four) times daily. (Patient taking differently: Take 300 mg by mouth daily. ), Disp: 360 capsule, Rfl: 3 .  lanthanum (FOSRENOL) 1000 MG chewable tablet, Chew 2,000 mg by mouth., Disp: , Rfl:  .  nortriptyline (PAMELOR) 10 MG capsule, Take  10 mg by mouth at bedtime., Disp: , Rfl:  .  omeprazole (PRILOSEC) 20 MG capsule, Take 20 mg by mouth daily., Disp: , Rfl:  .  rOPINIRole (REQUIP) 0.5 MG tablet, Take 1-2 tablets in the morning po., Disp: 180 tablet, Rfl: 3 .  rOPINIRole (REQUIP) 2 MG tablet, Take 1 tablet (2 mg total) by mouth at bedtime., Disp: 90 tablet, Rfl: 3 .  sulfaSALAzine (AZULFIDINE) 500 MG tablet, 4 (four) times daily. , Disp: , Rfl:  .  traMADol (ULTRAM) 50 MG tablet, Take 50 mg by mouth every 6 (six) hours as needed., Disp: , Rfl:  .  warfarin (COUMADIN) 5 MG tablet, Take by mouth one time only at 6 PM. , Disp: , Rfl:   Allergies:  Allergies  Allergen Reactions  . Estrogens Other (See Comments)    Blood clots  . Other     Unable to take birth control pills due to clotting disorder/fim  . Prednisone Other (See Comments)    Facial edema     Past Medical History, Surgical history, Social history, and Family History were reviewed and updated.  Review of Systems: As in the interim history  Physical Exam:  weight is 254 lb 8 oz (115.4 kg). Her oral temperature is 98.2 F (36.8 C). Her blood pressure is 137/62 and her pulse is 80. Her respiration is 20 and oxygen saturation is 100%.   Wt Readings from Last 3 Encounters:  06/25/17 254 lb 8 oz (115.4 kg)  03/31/17 260 lb (117.9 kg)  04/16/16 254 lb 1.3 oz (115.2 kg)     Well-developed and well-nourished white female in no obvious distress. Head and neck exam shows no ocular or oral lesions. There are no palpable cervical or supraclavicular lymph nodes. Lungs are clear bilaterally. Cardiac exam regular rate and rhythm with a normal S1 and S2. She does have a 3/6 systolic murmur which probably is her fistula. Abdomen is soft. She is somewhat obese. He can feel these firm masses subcutaneously. This is part of her Dercum's disease. Extremities shows no clubbing, cyanosis or edema. Neurological exam is nonfocal. Lab Results  Component Value Date   WBC 4.5  06/25/2017   HGB 9.9 (L) 06/25/2017   HCT 32.3 (L) 06/25/2017   MCV 92 06/25/2017   PLT 202 06/25/2017     Chemistry      Component Value Date/Time   NA 144 06/25/2017 1146   K 4.1 06/25/2017 1146   CL 103 06/25/2017 1146   CO2 31 06/25/2017 1146   BUN 27 (H) 06/25/2017 1146   CREATININE 4.7 (HH) 06/25/2017 1146      Component Value Date/Time   CALCIUM 9.7 06/25/2017 1146   ALKPHOS 60 06/25/2017 1146   AST 28 06/25/2017 1146   ALT 14 06/25/2017 1146   BILITOT 0.60 06/25/2017 1146         Impression and Plan: Ms. Chavez is  a 61 year old white female. She has history of recurrent thrombosis. She has both factor V Leiden and prothrombin II G mutation. She is on lifelong Coumadin.  Since she will not have the procedure done at [facility], she does not have to worry about bridging with heparin.  She is going to look into having the catheter placed at [facility]. She is not sure when this would be.  Sure he has the heparin at home. Again I think that since this is a minor surgical procedure, that she should be okay to do heparin 5 days before and then 5 days after and get onto Coumadin the day after her procedure.  She will let me know when she wants to have the procedure done and then come back to see [place].  Volanda Napoleon, MD 10/11/20181:27 PM

## 2017-06-26 LAB — D-DIMER, QUANTITATIVE: D-DIMER: 0.55 mg/L FEU — ABNORMAL HIGH (ref 0.00–0.49)

## 2017-07-16 DIAGNOSIS — F321 Major depressive disorder, single episode, moderate: Secondary | ICD-10-CM | POA: Insufficient documentation

## 2017-10-23 ENCOUNTER — Emergency Department (HOSPITAL_BASED_OUTPATIENT_CLINIC_OR_DEPARTMENT_OTHER): Payer: Medicare Other

## 2017-10-23 ENCOUNTER — Encounter (HOSPITAL_BASED_OUTPATIENT_CLINIC_OR_DEPARTMENT_OTHER): Payer: Self-pay | Admitting: *Deleted

## 2017-10-23 ENCOUNTER — Inpatient Hospital Stay (HOSPITAL_BASED_OUTPATIENT_CLINIC_OR_DEPARTMENT_OTHER)
Admission: EM | Admit: 2017-10-23 | Discharge: 2017-10-26 | DRG: 193 | Disposition: A | Discharge status: Home Health | Payer: Medicare Other | Attending: Family Medicine | Admitting: Family Medicine

## 2017-10-23 ENCOUNTER — Other Ambulatory Visit: Payer: Self-pay

## 2017-10-23 DIAGNOSIS — D631 Anemia in chronic kidney disease: Secondary | ICD-10-CM | POA: Diagnosis present

## 2017-10-23 DIAGNOSIS — G2581 Restless legs syndrome: Secondary | ICD-10-CM | POA: Diagnosis present

## 2017-10-23 DIAGNOSIS — R0902 Hypoxemia: Secondary | ICD-10-CM | POA: Diagnosis present

## 2017-10-23 DIAGNOSIS — I120 Hypertensive chronic kidney disease with stage 5 chronic kidney disease or end stage renal disease: Secondary | ICD-10-CM | POA: Diagnosis present

## 2017-10-23 DIAGNOSIS — G629 Polyneuropathy, unspecified: Secondary | ICD-10-CM

## 2017-10-23 DIAGNOSIS — Z7901 Long term (current) use of anticoagulants: Secondary | ICD-10-CM | POA: Diagnosis not present

## 2017-10-23 DIAGNOSIS — Z823 Family history of stroke: Secondary | ICD-10-CM

## 2017-10-23 DIAGNOSIS — E882 Lipomatosis, not elsewhere classified: Secondary | ICD-10-CM

## 2017-10-23 DIAGNOSIS — Z992 Dependence on renal dialysis: Secondary | ICD-10-CM

## 2017-10-23 DIAGNOSIS — K529 Noninfective gastroenteritis and colitis, unspecified: Secondary | ICD-10-CM

## 2017-10-23 DIAGNOSIS — Z87891 Personal history of nicotine dependence: Secondary | ICD-10-CM | POA: Diagnosis not present

## 2017-10-23 DIAGNOSIS — Z86711 Personal history of pulmonary embolism: Secondary | ICD-10-CM

## 2017-10-23 DIAGNOSIS — Z809 Family history of malignant neoplasm, unspecified: Secondary | ICD-10-CM

## 2017-10-23 DIAGNOSIS — D649 Anemia, unspecified: Secondary | ICD-10-CM

## 2017-10-23 DIAGNOSIS — Z833 Family history of diabetes mellitus: Secondary | ICD-10-CM

## 2017-10-23 DIAGNOSIS — G4733 Obstructive sleep apnea (adult) (pediatric): Secondary | ICD-10-CM | POA: Diagnosis present

## 2017-10-23 DIAGNOSIS — Z9989 Dependence on other enabling machines and devices: Secondary | ICD-10-CM | POA: Diagnosis present

## 2017-10-23 DIAGNOSIS — Z825 Family history of asthma and other chronic lower respiratory diseases: Secondary | ICD-10-CM | POA: Diagnosis not present

## 2017-10-23 DIAGNOSIS — G8929 Other chronic pain: Secondary | ICD-10-CM | POA: Diagnosis present

## 2017-10-23 DIAGNOSIS — J209 Acute bronchitis, unspecified: Secondary | ICD-10-CM

## 2017-10-23 DIAGNOSIS — Z981 Arthrodesis status: Secondary | ICD-10-CM | POA: Diagnosis not present

## 2017-10-23 DIAGNOSIS — T8189XA Other complications of procedures, not elsewhere classified, initial encounter: Secondary | ICD-10-CM | POA: Diagnosis present

## 2017-10-23 DIAGNOSIS — G4730 Sleep apnea, unspecified: Secondary | ICD-10-CM | POA: Diagnosis present

## 2017-10-23 DIAGNOSIS — N186 End stage renal disease: Secondary | ICD-10-CM | POA: Diagnosis present

## 2017-10-23 DIAGNOSIS — Z888 Allergy status to other drugs, medicaments and biological substances status: Secondary | ICD-10-CM

## 2017-10-23 DIAGNOSIS — J441 Chronic obstructive pulmonary disease with (acute) exacerbation: Secondary | ICD-10-CM | POA: Diagnosis present

## 2017-10-23 DIAGNOSIS — D6851 Activated protein C resistance: Secondary | ICD-10-CM | POA: Diagnosis present

## 2017-10-23 DIAGNOSIS — Z811 Family history of alcohol abuse and dependence: Secondary | ICD-10-CM | POA: Diagnosis not present

## 2017-10-23 DIAGNOSIS — Z8249 Family history of ischemic heart disease and other diseases of the circulatory system: Secondary | ICD-10-CM | POA: Diagnosis not present

## 2017-10-23 DIAGNOSIS — G4700 Insomnia, unspecified: Secondary | ICD-10-CM | POA: Diagnosis present

## 2017-10-23 DIAGNOSIS — Z86718 Personal history of other venous thrombosis and embolism: Secondary | ICD-10-CM | POA: Diagnosis not present

## 2017-10-23 DIAGNOSIS — Z6841 Body Mass Index (BMI) 40.0 and over, adult: Secondary | ICD-10-CM

## 2017-10-23 DIAGNOSIS — T148XXA Other injury of unspecified body region, initial encounter: Secondary | ICD-10-CM

## 2017-10-23 DIAGNOSIS — S31109A Unspecified open wound of abdominal wall, unspecified quadrant without penetration into peritoneal cavity, initial encounter: Secondary | ICD-10-CM

## 2017-10-23 DIAGNOSIS — J449 Chronic obstructive pulmonary disease, unspecified: Secondary | ICD-10-CM | POA: Diagnosis present

## 2017-10-23 DIAGNOSIS — J101 Influenza due to other identified influenza virus with other respiratory manifestations: Principal | ICD-10-CM | POA: Diagnosis present

## 2017-10-23 LAB — COMPREHENSIVE METABOLIC PANEL
ALT: 12 U/L — ABNORMAL LOW (ref 14–54)
AST: 29 U/L (ref 15–41)
Albumin: 4 g/dL (ref 3.5–5.0)
Alkaline Phosphatase: 59 U/L (ref 38–126)
Anion gap: 13 (ref 5–15)
BUN: 43 mg/dL — ABNORMAL HIGH (ref 6–20)
CO2: 25 mmol/L (ref 22–32)
Calcium: 9.1 mg/dL (ref 8.9–10.3)
Chloride: 97 mmol/L — ABNORMAL LOW (ref 101–111)
Creatinine, Ser: 6.43 mg/dL — ABNORMAL HIGH (ref 0.44–1.00)
GFR calc Af Amer: 7 mL/min — ABNORMAL LOW (ref >60)
GFR calc non Af Amer: 6 mL/min — ABNORMAL LOW (ref >60)
Glucose, Bld: 102 mg/dL — ABNORMAL HIGH (ref 65–99)
Potassium: 4.7 mmol/L (ref 3.5–5.1)
Sodium: 135 mmol/L (ref 135–145)
Total Bilirubin: 0.8 mg/dL (ref 0.3–1.2)
Total Protein: 7.2 g/dL (ref 6.5–8.1)

## 2017-10-23 LAB — CBC WITH DIFFERENTIAL/PLATELET
Basophils Absolute: 0 10*3/uL (ref 0.0–0.1)
Basophils Relative: 0 %
Eosinophils Absolute: 0 10*3/uL (ref 0.0–0.7)
Eosinophils Relative: 1 %
HCT: 28.3 % — ABNORMAL LOW (ref 36.0–46.0)
Hemoglobin: 9 g/dL — ABNORMAL LOW (ref 12.0–15.0)
Lymphocytes Relative: 10 %
Lymphs Abs: 0.5 10*3/uL — ABNORMAL LOW (ref 0.7–4.0)
MCH: 29.8 pg (ref 26.0–34.0)
MCHC: 31.8 g/dL (ref 30.0–36.0)
MCV: 93.7 fL (ref 78.0–100.0)
Monocytes Absolute: 0.4 10*3/uL (ref 0.1–1.0)
Monocytes Relative: 9 %
Neutro Abs: 3.9 10*3/uL (ref 1.7–7.7)
Neutrophils Relative %: 80 %
Platelets: 178 10*3/uL (ref 150–400)
RBC: 3.02 MIL/uL — ABNORMAL LOW (ref 3.87–5.11)
RDW: 16.2 % — ABNORMAL HIGH (ref 11.5–15.5)
WBC: 4.9 10*3/uL (ref 4.0–10.5)

## 2017-10-23 LAB — PROTIME-INR
INR: 2.21
Prothrombin Time: 24.3 seconds — ABNORMAL HIGH (ref 11.4–15.2)

## 2017-10-23 LAB — TROPONIN I: Troponin I: <0.03 ng/mL (ref <0.03)

## 2017-10-23 LAB — BRAIN NATRIURETIC PEPTIDE: B Natriuretic Peptide: 434.6 pg/mL — ABNORMAL HIGH (ref 0.0–100.0)

## 2017-10-23 MED ORDER — SULFASALAZINE 500 MG PO TABS
2000.0000 mg | ORAL_TABLET | Freq: Two times a day (BID) | ORAL | Status: DC
  Administered 2017-10-23 – 2017-10-24 (×2): 2000 mg via ORAL
  Administered 2017-10-24: 1000 mg via ORAL
  Administered 2017-10-25 – 2017-10-26 (×4): 2000 mg via ORAL
  Filled 2017-10-23 (×7): qty 4

## 2017-10-23 MED ORDER — LEVOFLOXACIN IN D5W 500 MG/100ML IV SOLN
500.0000 mg | Freq: Once | INTRAVENOUS | Status: AC
  Administered 2017-10-23: 500 mg via INTRAVENOUS
  Filled 2017-10-23: qty 100

## 2017-10-23 MED ORDER — METHYLPREDNISOLONE SODIUM SUCC 125 MG IJ SOLR
125.0000 mg | Freq: Once | INTRAMUSCULAR | Status: AC
  Administered 2017-10-23: 125 mg via INTRAVENOUS
  Filled 2017-10-23: qty 2

## 2017-10-23 MED ORDER — LEVOFLOXACIN 500 MG PO TABS
500.0000 mg | ORAL_TABLET | ORAL | Status: DC
  Filled 2017-10-23: qty 1

## 2017-10-23 MED ORDER — NORTRIPTYLINE HCL 10 MG PO CAPS
10.0000 mg | ORAL_CAPSULE | Freq: Every day | ORAL | Status: DC
  Administered 2017-10-23: 10 mg via ORAL
  Filled 2017-10-23: qty 1

## 2017-10-23 MED ORDER — WARFARIN SODIUM 4 MG PO TABS
4.0000 mg | ORAL_TABLET | Freq: Once | ORAL | Status: AC
  Administered 2017-10-23: 4 mg via ORAL
  Filled 2017-10-23: qty 1

## 2017-10-23 MED ORDER — TRAMADOL HCL 50 MG PO TABS
50.0000 mg | ORAL_TABLET | Freq: Every day | ORAL | Status: DC | PRN
  Administered 2017-10-24 – 2017-10-25 (×2): 50 mg via ORAL
  Filled 2017-10-23 (×2): qty 1

## 2017-10-23 MED ORDER — ALBUTEROL SULFATE (2.5 MG/3ML) 0.083% IN NEBU
5.0000 mg | INHALATION_SOLUTION | Freq: Once | RESPIRATORY_TRACT | Status: AC
  Administered 2017-10-23: 5 mg via RESPIRATORY_TRACT
  Filled 2017-10-23: qty 6

## 2017-10-23 MED ORDER — METHYLPREDNISOLONE 32 MG PO TABS
32.0000 mg | ORAL_TABLET | Freq: Every day | ORAL | Status: DC
  Administered 2017-10-24 – 2017-10-26 (×4): 32 mg via ORAL
  Filled 2017-10-23 (×3): qty 1

## 2017-10-23 MED ORDER — ROPINIROLE HCL 1 MG PO TABS
2.5000 mg | ORAL_TABLET | Freq: Every day | ORAL | Status: DC
  Administered 2017-10-23: 2.5 mg via ORAL
  Filled 2017-10-23: qty 1

## 2017-10-23 MED ORDER — LANTHANUM CARBONATE 500 MG PO CHEW
2000.0000 mg | CHEWABLE_TABLET | Freq: Two times a day (BID) | ORAL | Status: DC
  Administered 2017-10-24: 2000 mg via ORAL
  Administered 2017-10-24: 1000 mg via ORAL
  Administered 2017-10-25 – 2017-10-26 (×2): 2000 mg via ORAL
  Filled 2017-10-23 (×4): qty 4

## 2017-10-23 MED ORDER — ALBUTEROL SULFATE (2.5 MG/3ML) 0.083% IN NEBU
2.5000 mg | INHALATION_SOLUTION | Freq: Four times a day (QID) | RESPIRATORY_TRACT | Status: DC
  Administered 2017-10-24 – 2017-10-26 (×9): 2.5 mg via RESPIRATORY_TRACT
  Filled 2017-10-23 (×10): qty 3

## 2017-10-23 MED ORDER — GABAPENTIN 300 MG PO CAPS
300.0000 mg | ORAL_CAPSULE | Freq: Every day | ORAL | Status: DC
  Administered 2017-10-23 – 2017-10-25 (×3): 300 mg via ORAL
  Filled 2017-10-23 (×3): qty 1

## 2017-10-23 MED ORDER — ALBUTEROL SULFATE (2.5 MG/3ML) 0.083% IN NEBU
2.5000 mg | INHALATION_SOLUTION | RESPIRATORY_TRACT | Status: DC
  Administered 2017-10-23: 2.5 mg via RESPIRATORY_TRACT
  Filled 2017-10-23: qty 3

## 2017-10-23 MED ORDER — MOMETASONE FURO-FORMOTEROL FUM 100-5 MCG/ACT IN AERO
2.0000 | INHALATION_SPRAY | Freq: Two times a day (BID) | RESPIRATORY_TRACT | Status: DC
  Administered 2017-10-24 – 2017-10-26 (×6): 2 via RESPIRATORY_TRACT
  Filled 2017-10-23: qty 8.8

## 2017-10-23 MED ORDER — ALBUTEROL (5 MG/ML) CONTINUOUS INHALATION SOLN
10.0000 mg/h | INHALATION_SOLUTION | RESPIRATORY_TRACT | Status: DC
  Administered 2017-10-23: 10 mg/h via RESPIRATORY_TRACT
  Filled 2017-10-23: qty 20

## 2017-10-23 MED ORDER — TRAMADOL HCL 50 MG PO TABS
50.0000 mg | ORAL_TABLET | Freq: Once | ORAL | Status: AC
  Administered 2017-10-23: 50 mg via ORAL
  Filled 2017-10-23: qty 1

## 2017-10-23 MED ORDER — WARFARIN - PHARMACIST DOSING INPATIENT: Freq: Every day | Status: DC

## 2017-10-23 NOTE — H&P (Signed)
History and Physical    Whitney Chavez DOB: 09-Apr-1956 DOA: 10/23/2017  PCP: Kathyrn Lass, MD  Patient coming from: home   Chief Complaint: cough and shortness of breath  HPI: Whitney Chavez is a 62 y.o. female with medical history significant for esrd on dialysis, hx of pe and f5 leiden mutation on coumadin, morbid obesity, osa on cpap, former smoker, reactive airway disease, who presents with shortness of breath and cough.  In usual state of health until about 2 days ago when developed significant nasal congestion (now improved), and later cough that became productive, as well as shortness of breath with exertion. No chest pain or palpitations. No leg swelling but normal 2 pillow orthopnea now is 3-4 last few nights. Dry weight is 256 pounds. Denies history of heart failure or MI. Does have history of reactive airway disesase, says has not had a formal diagnosis but does take a daily inhaler. No history hospitalization or ed visit for breathing problems. No recent sick contacts. Vomited once after fit of coughing, otherwise no vomiting or diarrhea. No fevers. No body aches. No blood in cough. No leg swelling.   ESRD on MWF dialysis, last dialized on Wednesday.  Notes had recent derm diagnosis of dercum's disease, has had constant drainage rlq of abdomen from biopsy site, no pain or surrounding redness.  ED Course: [facility], given solu-medrol, albuterol, levaquin. Also cxr, labs.  Review of Systems: As per HPI otherwise 10 point review of systems negative.    Past Medical History:  Diagnosis Date  . Axillary vein thrombosis ([place]) 08/16/2015  . Chronic kidney disease   . Hypertension   . MRSA (methicillin resistant staph aureus) culture positive   . Neuropathy   . Obstructive sleep apnea 03/29/2015  . OSA on CPAP   . Vision abnormalities     Past Surgical History:  Procedure Laterality Date  . AV FISTULA PLACEMENT Left   . lumbar decompression fusion    .  TONSILLECTOMY       reports that she has quit smoking. she has never used smokeless tobacco. She reports that she does not drink alcohol or use drugs.  Allergies  Allergen Reactions  . Estrogens Other (See Comments)    Blood clots  . Other     Unable to take birth control pills due to clotting disorder/fim  . Prednisone Other (See Comments)    Facial edema     Family History  Problem Relation Age of Onset  . Diabetes Mother   . Congestive Heart Failure Mother   . COPD Mother   . Stroke Mother   . Neuropathy Mother   . Heart disease Father   . Stroke Father   . Cancer Brother   . Diabetes Brother   . Diabetes Brother   . Alcohol abuse Brother     Prior to Admission medications   Medication Sig Start Date End Date Taking? Authorizing Provider  acetaminophen (TYLENOL) 325 MG tablet Take 650 mg by mouth.   Yes [provider]  gabapentin (NEURONTIN) 100 MG capsule Take 1 capsule (100 mg total) by mouth 4 (four) times daily. Patient taking differently: Take 300 mg by mouth daily.  03/31/17  Yes Sater, Nanine Means, MD  lanthanum (FOSRENOL) 1000 MG chewable tablet Chew 2,000 mg by mouth. 12/04/16  Yes [provider]  omeprazole (PRILOSEC) 20 MG capsule Take 20 mg by mouth daily.   Yes [provider]  rOPINIRole (REQUIP) 0.5 MG tablet Take 1-2  tablets in the morning po. 03/31/17  Yes Sater, Nanine Means, MD  rOPINIRole (REQUIP) 2 MG tablet Take 1 tablet (2 mg total) by mouth at bedtime. 03/31/17  Yes Sater, Nanine Means, MD  sulfaSALAzine (AZULFIDINE) 500 MG tablet 4 (four) times daily.  02/15/15  Yes [provider]  traMADol (ULTRAM) 50 MG tablet Take 50 mg by mouth every 6 (six) hours as needed.   Yes [provider]  warfarin (COUMADIN) 5 MG tablet Take by mouth one time only at 6 PM.  02/15/15  Yes [provider]  ADVAIR DISKUS 100-50 MCG/DOSE AEPB  04/04/16   [provider]  nortriptyline (PAMELOR) 10 MG capsule Take 10 mg by  mouth at bedtime.    [provider]    Physical Exam: Vitals:   10/23/17 1439 10/23/17 1543 10/23/17 1730 10/23/17 1849  BP:  (!) 152/74 (!) 130/58 (!) 144/69  Pulse:  94 93 90  Resp:  18 20 18   Temp:    99.7 F (37.6 C)  TempSrc:    Oral  SpO2: 95% 96% 95% 92%    Constitutional: No acute distress. Obese Head: Atraumatic Eyes: Conjunctiva clear ENM: Moist mucous membranes. Normal dentition.  Neck: Supple Respiratory: Clear to auscultation bilaterally, no wheezing/rales/rhonchi. Normal respiratory effort. No accessory muscle use. . Cardiovascular: Regular rate and rhythm. No murmurs/rubs/gallops. Abdomen: obese. Multiple subcutaneous firm nodules. rlq there is a small open fistula draining small amount of white fluid no surrounding redness, no induration. Musculoskeletal: No joint deformity upper and lower extremities. Normal ROM, no contractures. Normal muscle tone.  Skin: see abdomen Extremities: No peripheral edema. Palpable peripheral pulses. Neurologic: Alert, moving all 4 extremities. Psychiatric: Normal insight and judgement.   Labs on Admission: I have personally reviewed following labs and imaging studies  CBC: Recent Labs  Lab 10/23/17 1058  WBC 4.9  NEUTROABS 3.9  HGB 9.0*  HCT 28.3*  MCV 93.7  PLT 619   Basic Metabolic Panel: Recent Labs  Lab 10/23/17 1058  NA 135  K 4.7  CL 97*  CO2 25  GLUCOSE 102*  BUN 43*  CREATININE 6.43*  CALCIUM 9.1   GFR: CrCl cannot be calculated (Unknown ideal weight.). Liver Function Tests: Recent Labs  Lab 10/23/17 1058  AST 29  ALT 12*  ALKPHOS 59  BILITOT 0.8  PROT 7.2  ALBUMIN 4.0   No results for input(s): LIPASE, AMYLASE in the last 168 hours. No results for input(s): AMMONIA in the last 168 hours. Coagulation Profile: Recent Labs  Lab 10/23/17 1058  INR 2.21   Cardiac Enzymes: Recent Labs  Lab 10/23/17 1058  TROPONINI <0.03   BNP (last 3 results) No results for input(s): PROBNP  in the last 8760 hours. HbA1C: No results for input(s): HGBA1C in the last 72 hours. CBG: No results for input(s): GLUCAP in the last 168 hours. Lipid Profile: No results for input(s): CHOL, HDL, LDLCALC, TRIG, CHOLHDL, LDLDIRECT in the last 72 hours. Thyroid Function Tests: No results for input(s): TSH, T4TOTAL, FREET4, T3FREE, THYROIDAB in the last 72 hours. Anemia Panel: No results for input(s): VITAMINB12, FOLATE, FERRITIN, TIBC, IRON, RETICCTPCT in the last 72 hours. Urine analysis: No results found for: COLORURINE, APPEARANCEUR, LABSPEC, PHURINE, GLUCOSEU, HGBUR, BILIRUBINUR, KETONESUR, PROTEINUR, UROBILINOGEN, NITRITE, LEUKOCYTESUR  Radiological Exams on Admission: Dg Chest 2 View  Result Date: 10/23/2017 CLINICAL DATA:  Cough, congestion and shortness of breath since yesterday. EXAM: CHEST  2 VIEW COMPARISON:  None. FINDINGS: Cardiac silhouette is mildly enlarged. No mediastinal  or hilar masses. No convincing adenopathy. Lungs are hyperexpanded. Prominent cardiophrenic angle fat pad at the medial right lung base. No evidence of pneumonia. No pulmonary edema. No convincing pleural effusion and no pneumothorax. Skeletal structures are intact. IMPRESSION: 1. No acute cardiopulmonary disease. 2. Mild cardiomegaly. Electronically Signed   By: Lajean Manes M.D.   On: 10/23/2017 11:21    EKG: Independently reviewed. No ischemic changes, normal axis, qtc 451  Assessment/Plan Active Problems:   Obstructive sleep apnea   Insomnia   Polyneuropathy   Restless leg   Benign hypertension   Long term current use of anticoagulant   End-stage renal disease on hemodialysis (HCC)   Factor V Leiden mutation (HCC)   Morbid (severe) obesity due to excess calories (HCC)   Apnea, sleep   COPD with acute exacerbation (HCC)   Hypoxia   Dercum disease   Open abdominal wall wound   Colitis   Normocytic anemia   COPD exacerbation (HCC)   # COPD exacerbation - has not been formally diagnosed, but  given extensive smoking history dx is probable. Asthma may also be component but pt says does symptoms started later in life, which favors copd. bnp is elevated but pt is esrd; trop is negative and no pulmonary edema on CXR and no LE edema, and pt denies hx chf, so think chf lower on diagnosis. PE also possible but therapeutic on coumadin, no abnormalities seen on CXR, no signs dvt on exam. Was reportedly hypoxic to mid-80s with ambulation at outside ED. Feels much better after treatment there, here sitting comfortably, lungs clear, no hypoxic on resting pulse ox. - continue methylprednisone 32 mg daily (reported allergy to prednisone), levqauin, and scheduled albuterol - substitute for home fluticasone/salmeterol - f/u flu pcr - monitor o2, supplement as needed  # ESRD on dialysis - no signs volume overload today, bp mildly elevated, no sig edema, K wnl. Dry weight 256 pounds, current weight pending. Missed dialysis today. - nephro consult in am for dialysis - continue home lanthanum - renally dose medications - daily weights  # Right lower abdominal wound - has had persistent drainage after biopsy 2 months ago after which received diagnosis of dercum's disease. Appears to have formed a fistula, is draining small amount of white fluid but no surrounding induration, no fluctuance. Has outpt derm f/u to address - daily dressing changes, monitor for infection  # OSA  - continue nightly cpap  # HTN - controlled with hemodialysis, is off oral antihypertensives - mild elevation here, continue to monitor  # restless leg syndrome - continue home requip  # insomnia - continue home nortryptyline  # Colitis - pt unable to give more information about specific diagnosis but says has been well controlled with bid sufasalazine - continue home sulfasalazine  # history PE, factory 5 leiden mutation - inr 2.21 - warfarin dosing per pharmacy given new medications and esrd  # chronic pain - continue  home tramadol prn    DVT prophylaxis: therapeutic on coumadin; scds Code Status: full  Family Communication: friend gail yoder [phone]  Disposition Plan: tbd  Consults called: none  Admission status: med/surg    Desma Maxim MD [facility] Pager [phone]  If 7PM-7AM, please contact night-coverage [email or web address] Password [facility]  10/23/2017, 7:34 PM

## 2017-10-23 NOTE — Progress Notes (Signed)
Patient admitted to floor at 6:30pm, received report from [facility] at 6:20pm. Will continue to monitor patient.

## 2017-10-23 NOTE — ED Notes (Signed)
Pt given water and oatmeal per RN

## 2017-10-23 NOTE — ED Notes (Signed)
Patient transported to X-ray 

## 2017-10-23 NOTE — ED Provider Notes (Signed)
[facility] EMERGENCY DEPARTMENT Provider Note   CSN: [id number] Arrival date & time: 10/23/17  0941     History   Chief Complaint Chief Complaint  Patient presents with  . Shortness of Breath    HPI Whitney Chavez is a 62 y.o. female.  HPI Patient presents with 2 days of cough productive of yellow sputum, wheezing and shortness of breath.  States her shortness of breath is worse with any exertion.  She denies any fever or chills.  No new lower extremity swelling or pain.  She does have some chest tightness but denies chest pain.  Was last dialyzed on Wednesday. Past Medical History:  Diagnosis Date  . Axillary vein thrombosis ([place]) 08/16/2015  . Chronic kidney disease   . Hypertension   . MRSA (methicillin resistant staph aureus) culture positive   . Neuropathy   . Obstructive sleep apnea 03/29/2015  . OSA on CPAP   . Vision abnormalities     Patient Active Problem List   Diagnosis Date Noted  . Long term current use of anticoagulant 01/17/2016  . Factor V Leiden mutation ([place]) 01/17/2016  . Axillary vein thrombosis ([place]) 08/16/2015  . Obstructive sleep apnea 03/29/2015  . Factor 5 Leiden mutation, heterozygous ([place]) 03/29/2015  . Insomnia 03/29/2015  . Polyneuropathy 03/29/2015  . Restless leg 03/29/2015  . Gait disturbance 03/29/2015  . Benign hypertension 03/01/2015  . Airway hyperreactivity 08/19/2013  . Morbid (severe) obesity due to excess calories ([place]) 08/19/2013  . Apnea, sleep 08/19/2013  . End-stage renal disease on hemodialysis ([place]) 08/18/2013    Past Surgical History:  Procedure Laterality Date  . AV FISTULA PLACEMENT Left   . lumbar decompression fusion    . TONSILLECTOMY      OB History    No data available       Home Medications    Prior to Admission medications   Medication Sig Start Date End Date Taking? Authorizing Provider  sulfaSALAzine (AZULFIDINE) 500 MG tablet 4 (four) times daily.  02/15/15  Yes [provider]    acetaminophen (TYLENOL) 325 MG tablet Take 650 mg by mouth.    [provider]  ADVAIR DISKUS 100-50 MCG/DOSE AEPB  04/04/16   [provider]  gabapentin (NEURONTIN) 100 MG capsule Take 1 capsule (100 mg total) by mouth 4 (four) times daily. Patient taking differently: Take 300 mg by mouth daily.  03/31/17   Sater, Nanine Means, MD  lanthanum (FOSRENOL) 1000 MG chewable tablet Chew 2,000 mg by mouth. 12/04/16   [provider]  nortriptyline (PAMELOR) 10 MG capsule Take 10 mg by mouth at bedtime.    [provider]  omeprazole (PRILOSEC) 20 MG capsule Take 20 mg by mouth daily.    [provider]  rOPINIRole (REQUIP) 0.5 MG tablet Take 1-2 tablets in the morning po. 03/31/17   Sater, Nanine Means, MD  rOPINIRole (REQUIP) 2 MG tablet Take 1 tablet (2 mg total) by mouth at bedtime. 03/31/17   Sater, Nanine Means, MD  traMADol (ULTRAM) 50 MG tablet Take 50 mg by mouth every 6 (six) hours as needed.    [provider]  warfarin (COUMADIN) 5 MG tablet Take by mouth one time only at 6 PM.  02/15/15   [provider]    Family History Family History  Problem Relation Age of Onset  . Diabetes Mother   . Congestive Heart Failure Mother   . COPD Mother   . Stroke Mother   . Neuropathy Mother   .  Heart disease Father   . Stroke Father   . Cancer Brother   . Diabetes Brother   . Diabetes Brother   . Alcohol abuse Brother     Social History Social History   Tobacco Use  . Smoking status: Former Research scientist (life sciences)  . Smokeless tobacco: Never Used  Substance Use Topics  . Alcohol use: No    Alcohol/week: 0.0 oz  . Drug use: No     Allergies   Estrogens; Other; and Prednisone   Review of Systems Review of Systems  Constitutional: Negative for chills and fever.  HENT: Positive for congestion, postnasal drip, sinus pressure and sinus pain. Negative for trouble swallowing.   Eyes: Negative for visual disturbance.  Respiratory: Positive for cough,  chest tightness, shortness of breath and wheezing.   Cardiovascular: Negative for chest pain, palpitations and leg swelling.  Gastrointestinal: Negative for abdominal pain, diarrhea, nausea and vomiting.  Musculoskeletal: Negative for back pain, myalgias, neck pain and neck stiffness.  Skin: Negative for rash and wound.  Neurological: Negative for dizziness, weakness, light-headedness, numbness and headaches.  All other systems reviewed and are negative.    Physical Exam Updated Vital Signs BP (!) 146/79 (BP Location: Right Wrist)   Pulse (!) 102   Temp 99.2 F (37.3 C) (Oral)   Resp 15   SpO2 95%   Physical Exam  Constitutional: She is oriented to person, place, and time. She appears well-developed and well-nourished. No distress.  HENT:  Head: Normocephalic and atraumatic.  Mouth/Throat: Oropharynx is clear and moist. No oropharyngeal exudate.  Eyes: EOM are normal. Pupils are equal, round, and reactive to light.  Neck: Normal range of motion. Neck supple. No JVD present.  Cardiovascular: Normal rate and regular rhythm. Exam reveals no gallop and no friction rub.  No murmur heard. Pulmonary/Chest: Effort normal. She has wheezes.  Increased work of breathing.  Wheezing throughout.  Abdominal: Soft. Bowel sounds are normal. She exhibits mass. There is no tenderness. There is no rebound and no guarding.  Multiple soft tissue masses on the patient's abdominal wall.  No tenderness.  Musculoskeletal: Normal range of motion. She exhibits no edema or tenderness.  AV fistula in left upper extremity with palpable thrill.  Lower extremities without tenderness, asymmetry or swelling.  Distal pulses intact.  Neurological: She is alert and oriented to person, place, and time.  Moves all extremities without deficit.  Sensation intact.  Skin: Skin is warm and dry. Capillary refill takes less than 2 seconds. No rash noted. She is not diaphoretic. No erythema.  Psychiatric: She has a normal mood  and affect. Her behavior is normal.  Nursing note and vitals reviewed.    ED Treatments / Results  Labs (all labs ordered are listed, but only abnormal results are displayed) Labs Reviewed  CBC WITH DIFFERENTIAL/PLATELET - Abnormal; Notable for the following components:      Result Value   RBC 3.02 (*)    Hemoglobin 9.0 (*)    HCT 28.3 (*)    RDW 16.2 (*)    Lymphs Abs 0.5 (*)    All other components within normal limits  COMPREHENSIVE METABOLIC PANEL - Abnormal; Notable for the following components:   Chloride 97 (*)    Glucose, Bld 102 (*)    BUN 43 (*)    Creatinine, Ser 6.43 (*)    ALT 12 (*)    GFR calc non Af Amer 6 (*)    GFR calc Af Amer 7 (*)    All  other components within normal limits  BRAIN NATRIURETIC PEPTIDE - Abnormal; Notable for the following components:   B Natriuretic Peptide 434.6 (*)    All other components within normal limits  PROTIME-INR - Abnormal; Notable for the following components:   Prothrombin Time 24.3 (*)    All other components within normal limits  TROPONIN I    EKG  EKG Interpretation None       Radiology Dg Chest 2 View  Result Date: 10/23/2017 CLINICAL DATA:  Cough, congestion and shortness of breath since yesterday. EXAM: CHEST  2 VIEW COMPARISON:  None. FINDINGS: Cardiac silhouette is mildly enlarged. No mediastinal or hilar masses. No convincing adenopathy. Lungs are hyperexpanded. Prominent cardiophrenic angle fat pad at the medial right lung base. No evidence of pneumonia. No pulmonary edema. No convincing pleural effusion and no pneumothorax. Skeletal structures are intact. IMPRESSION: 1. No acute cardiopulmonary disease. 2. Mild cardiomegaly. Electronically Signed   By: Lajean Manes M.D.   On: 10/23/2017 11:21    Procedures Procedures (including critical care time)  Medications Ordered in ED Medications  albuterol (PROVENTIL,VENTOLIN) solution continuous neb (0 mg/hr Nebulization Stopped 10/23/17 1310)    methylPREDNISolone sodium succinate (SOLU-MEDROL) 125 mg/2 mL injection 125 mg (125 mg Intravenous Given 10/23/17 1403)  albuterol (PROVENTIL) (2.5 MG/3ML) 0.083% nebulizer solution 5 mg (5 mg Nebulization Given 10/23/17 1439)  traMADol (ULTRAM) tablet 50 mg (50 mg Oral Given 10/23/17 1437)     Initial Impression / Assessment and Plan / ED Course  I have reviewed the triage vital signs and the nursing notes.  Pertinent labs & imaging results that were available during my care of the patient were reviewed by me and considered in my medical decision making (see chart for details).    Despite Solu-Medrol and an hour-long breathing treatment patient still has desaturations into the mid 80s with limited ambulation.  Does not appear to be fluid overloaded.  Discussed with hospitalist, Dr. Herbert Moors.  Has accepted patient to transfer to [facility] for admission.   Final Clinical Impressions(s) / ED Diagnoses   Final diagnoses:  Bronchitis with bronchospasm  Hypoxia    ED Discharge Orders    None       Julianne Rice, MD 10/23/17 [phone]

## 2017-10-23 NOTE — Progress Notes (Signed)
ANTICOAGULATION CONSULT NOTE - Initial Consult  Pharmacy Consult for warfarin Indication: DVT  Allergies  Allergen Reactions  . Estrogens Other (See Comments)    Blood clots  . Prednisone Other (See Comments)    Facial edema   . Other Other (See Comments)    Unable to take birth control pills due to clotting disorder/fim Fungal powder caused rash when on wound vac  . Tape Rash    Please use paper tape    Patient Measurements:    Vital Signs: Temp: 99.7 F (37.6 C) (02/08 1849) Temp Source: Oral (02/08 1849) BP: 144/69 (02/08 1849) Pulse Rate: 85 (02/08 2054)  Labs: Recent Labs    10/23/17 1058  HGB 9.0*  HCT 28.3*  PLT 178  LABPROT 24.3*  INR 2.21  CREATININE 6.43*  TROPONINI <0.03    CrCl cannot be calculated (Unknown ideal weight.).  Assessment: 62 yo with hx of dvt on warfarin PTA - 4 mg daily - last dose 2/7 (was told to take 6 mg per MD that day)  Admit INR 2.21  Goal of Therapy:  INR 2-3 Monitor platelets by anticoagulation protocol: Yes   Plan:  Warfarin 4 mg x 1  Daily INR  Levester Fresh, PharmD, BCPS, BCCCP Clinical Pharmacist Clinical phone for 10/23/2017 from 1430 - 2300: [phone] If after 2300, please call main pharmacy at: [phone] 10/23/2017 9:45 PM

## 2017-10-23 NOTE — ED Triage Notes (Signed)
Pt reports cough and congestion x yesterday, pt states she is due for dialysis today and called off due to her not feeling well. Dyspnea with exertion noted at triage, rt at chairside for resp assessment, pt taken directly to room 7 for ekg and aerosol treatment.

## 2017-10-23 NOTE — Progress Notes (Addendum)
Upon skin assessment, pt has an open wound on abdomen, RLQ due to an old biopsy that was taken in August that has never healed. MD aware. Cleansed and covered with foam dressing with very little drainage. Left hip/buttock area has a scabbed area due to patients skin disorder. Assessed. No need to cover, open to air.  Patient states she has a skin disorder, Dercum's. Trying to find a dermatologist to help with condition.

## 2017-10-23 NOTE — ED Notes (Signed)
Ambulated in the ED only about 40 steps. Increase WOB noted. SpO2 dropped to 87% from 93% resting. Pt states "I feel like can take a deeper breath but im just so tired." Also a increase in heart. 88 to 112.

## 2017-10-23 NOTE — ED Notes (Signed)
Pt. Is in no distress at this time.  She is speaking with no difficulty and breathing unlabored.

## 2017-10-24 ENCOUNTER — Encounter (HOSPITAL_COMMUNITY): Payer: Self-pay | Admitting: Nephrology

## 2017-10-24 DIAGNOSIS — K529 Noninfective gastroenteritis and colitis, unspecified: Secondary | ICD-10-CM

## 2017-10-24 DIAGNOSIS — I10 Essential (primary) hypertension: Secondary | ICD-10-CM

## 2017-10-24 DIAGNOSIS — J101 Influenza due to other identified influenza virus with other respiratory manifestations: Secondary | ICD-10-CM | POA: Diagnosis present

## 2017-10-24 DIAGNOSIS — J441 Chronic obstructive pulmonary disease with (acute) exacerbation: Secondary | ICD-10-CM | POA: Diagnosis not present

## 2017-10-24 LAB — BASIC METABOLIC PANEL
Anion gap: 17 — ABNORMAL HIGH (ref 5–15)
BUN: 49 mg/dL — ABNORMAL HIGH (ref 6–20)
CO2: 20 mmol/L — ABNORMAL LOW (ref 22–32)
Calcium: 9 mg/dL (ref 8.9–10.3)
Chloride: 95 mmol/L — ABNORMAL LOW (ref 101–111)
Creatinine, Ser: 7.32 mg/dL — ABNORMAL HIGH (ref 0.44–1.00)
GFR calc Af Amer: 6 mL/min — ABNORMAL LOW (ref >60)
GFR calc non Af Amer: 5 mL/min — ABNORMAL LOW (ref >60)
Glucose, Bld: 114 mg/dL — ABNORMAL HIGH (ref 65–99)
Potassium: 4.1 mmol/L (ref 3.5–5.1)
Sodium: 132 mmol/L — ABNORMAL LOW (ref 135–145)

## 2017-10-24 LAB — PROTIME-INR
INR: 3.33
Prothrombin Time: 33.5 seconds — ABNORMAL HIGH (ref 11.4–15.2)

## 2017-10-24 LAB — MRSA PCR SCREENING: MRSA by PCR: NEGATIVE

## 2017-10-24 LAB — INFLUENZA PANEL BY PCR (TYPE A & B)
Influenza A By PCR: POSITIVE — AB
Influenza B By PCR: NEGATIVE

## 2017-10-24 LAB — HIV ANTIBODY (ROUTINE TESTING W REFLEX): HIV Screen 4th Generation wRfx: NONREACTIVE

## 2017-10-24 MED ORDER — ROPINIROLE HCL 1 MG PO TABS
2.0000 mg | ORAL_TABLET | Freq: Every day | ORAL | Status: DC
  Administered 2017-10-25 (×2): 2 mg via ORAL
  Filled 2017-10-24 (×2): qty 2

## 2017-10-24 MED ORDER — HYDRALAZINE HCL 20 MG/ML IJ SOLN: 10.0000 mg | Freq: Three times a day (TID) | INTRAMUSCULAR | Status: DC | PRN

## 2017-10-24 MED ORDER — OSELTAMIVIR PHOSPHATE 30 MG PO CAPS: 30.0000 mg | ORAL_CAPSULE | ORAL | Status: DC

## 2017-10-24 MED ORDER — OSELTAMIVIR PHOSPHATE 30 MG PO CAPS
30.0000 mg | ORAL_CAPSULE | Freq: Once | ORAL | Status: AC
  Administered 2017-10-24: 30 mg via ORAL
  Filled 2017-10-24: qty 1

## 2017-10-24 MED ORDER — NORTRIPTYLINE HCL 10 MG PO CAPS
20.0000 mg | ORAL_CAPSULE | Freq: Every day | ORAL | Status: DC
  Administered 2017-10-25 (×2): 20 mg via ORAL
  Filled 2017-10-24 (×3): qty 2

## 2017-10-24 MED ORDER — ALBUTEROL SULFATE (2.5 MG/3ML) 0.083% IN NEBU
2.5000 mg | INHALATION_SOLUTION | RESPIRATORY_TRACT | Status: DC | PRN
  Administered 2017-10-24: 2.5 mg via RESPIRATORY_TRACT

## 2017-10-24 MED ORDER — ROPINIROLE HCL 0.5 MG PO TABS
0.5000 mg | ORAL_TABLET | Freq: Every day | ORAL | Status: DC
  Administered 2017-10-25: 0.5 mg via ORAL
  Filled 2017-10-24 (×2): qty 1

## 2017-10-24 NOTE — Consult Note (Signed)
Renal Service Consult Note [facility]  Whitney Chavez 10/24/2017 Sol Blazing Requesting Physician:  Dr Aggie Moats   Reason for Consult:  ESRD pt with COPD exac/ flu A HPI: The patient is a 62 y.o. year-old with hx of ESRD on HD in [place] on TTS schedule, admitted yesterday with SOB, cough, hypoxemia, wheezing.  In ED pt wheezing, CXR negative, flu A +, felt to have ashtma vs COPD flare.  Admitted and started on IV steroids, nebs, O2.  Asked to see for HD.    Pt on HD x 8 yrs, drives to dialysis.  Wants to switch to [facility] dialysis if possible.  Doesn't miss Rx's.  Presenting with head cold for a few days then SOB and unable to catch her breath.  Has LUA AVF.  No orthopnea.  No ankle swelling, has not missed any sig HD.     ROS  denies CP  no joint pain   no HA  no blurry vision  no rash  no diarrhea  no nausea/ vomiting   Past Medical History  Past Medical History:  Diagnosis Date  . 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 Chronic kidney disease   . Hypertension   . MRSA (methicillin resistant staph aureus) culture positive   . Neuropathy   . Obstructive sleep apnea 03/29/2015  . OSA on CPAP   . Vision abnormalities    Past Surgical History  Past Surgical History:  Procedure Laterality Date  . AV FISTULA PLACEMENT Left   . lumbar decompression fusion    . TONSILLECTOMY     Family History  Family History  Problem Relation Age of Onset  . Diabetes Mother   . Congestive Heart Failure Mother   . COPD Mother   . Stroke Mother   . Neuropathy Mother   . Heart disease Father   . Stroke Father   . Cancer Brother   . Diabetes Brother   . Diabetes Brother   . Alcohol abuse Brother    Social History  reports that she has quit smoking. she has never used smokeless tobacco. She reports that she does not drink alcohol or use drugs. Allergies  Allergies  Allergen Reactions  . Estrogens Other (See Comments)    Blood clots  . Prednisone Other (See  Comments)    Facial edema   . Other Other (See Comments)    Unable to take birth control pills due to clotting disorder/fim Fungal powder caused rash when on wound vac  . Tape Rash    Please use paper tape   Home medications Prior to Admission medications   Medication Sig Start Date End Date Taking? Authorizing Provider  acetaminophen (TYLENOL) 325 MG tablet Take 325 mg by mouth daily as needed for headache (pain).    Yes [provider]  B Complex-C-Folic Acid (RENAL VITAMIN PO) Take 1 tablet by mouth every evening.   Yes [provider]  cetirizine (ZYRTEC) 10 MG tablet Take 10 mg by mouth daily.   Yes [provider]  Fluticasone-Salmeterol 113-14 MCG/ACT AEPB Inhale 1 puff into the lungs 2 (two) times daily. 10/05/17  Yes [provider]  gabapentin (NEURONTIN) 100 MG capsule Take 1 capsule (100 mg total) by mouth 4 (four) times daily. Patient taking differently: Take 100-300 mg by mouth See admin instructions. Take one capsule (100 mg) by mouth mid afternoon and two capsules (200 mg) at bedtime OR take three capsules (300 mg) at bedtime 03/31/17  Yes Sater, Richard  A, MD  lanthanum (FOSRENOL) 1000 MG chewable tablet Chew 250-500 mg by mouth See admin instructions. Chew 1/2 tablet (500 mg) by mouth three times daily with meals, chew 1/4 tablet (250 mg) with snacks 12/04/16  Yes [provider]  Melatonin 10 MG TABS Take 10 mg by mouth at bedtime as needed (sleep).   Yes [provider]  nortriptyline (PAMELOR) 10 MG capsule Take 20 mg by mouth at bedtime.    Yes [provider]  omeprazole (PRILOSEC) 20 MG capsule Take 20 mg by mouth daily as needed (acid reflux).    Yes [provider]  PRESCRIPTION MEDICATION Inhale into the lungs at bedtime. CPAP with nasal pillow   Yes [provider]  rOPINIRole (REQUIP) 0.5 MG tablet Take 1-2 tablets in the morning po. Patient taking differently: Take 0.5 mg by mouth daily  at 3 pm.  03/31/17  Yes Sater, Nanine Means, MD  rOPINIRole (REQUIP) 2 MG tablet Take 1 tablet (2 mg total) by mouth at bedtime. 03/31/17  Yes Sater, Nanine Means, MD  sulfaSALAzine (AZULFIDINE) 500 MG tablet Take 1,000 mg by mouth 2 (two) times daily.  02/15/15  Yes [provider]  traMADol (ULTRAM) 50 MG tablet Take 50 mg by mouth 2 (two) times daily as needed (pain).    Yes [provider]  warfarin (COUMADIN) 4 MG tablet Take 4 mg by mouth daily at 3 pm.    Yes [provider]   Liver Function Tests Recent Labs  Lab 10/23/17 1058  AST 29  ALT 12*  ALKPHOS 59  BILITOT 0.8  PROT 7.2  ALBUMIN 4.0   No results for input(s): LIPASE, AMYLASE in the last 168 hours. CBC Recent Labs  Lab 10/23/17 1058  WBC 4.9  NEUTROABS 3.9  HGB 9.0*  HCT 28.3*  MCV 93.7  PLT 829   Basic Metabolic Panel Recent Labs  Lab 10/23/17 1058 10/24/17 0544  NA 135 132*  K 4.7 4.1  CL 97* 95*  CO2 25 20*  GLUCOSE 102* 114*  BUN 43* 49*  CREATININE 6.43* 7.32*  CALCIUM 9.1 9.0   Iron/TIBC/Ferritin/ %Sat No results found for: IRON, TIBC, FERRITIN, IRONPCTSAT  Vitals:   10/23/17 1849 10/23/17 2054 10/24/17 0630 10/24/17 1309  BP: (!) 144/69  (!) 143/76   Pulse: 90 85 84   Resp: 18 18 18    Temp: 99.7 F (37.6 C)  98.3 F (36.8 C)   TempSrc: Oral  Oral   SpO2: 92% 91% 94% 93%  Weight:  115.4 kg (254 lb 6.6 oz)    Height:  5\' 1"  (1.549 m)     Exam Gen obese WF , no distress Runny nose, congested, coughing, raspy cough Sclera anicteric, throat clear No jvd or bruits Chest diffusely poor air movement, +bilat wheezing, occ rhonchi RRR no MRG Abd soft ntnd no mass or ascites +bs obese GU defer MS no joint effusions or deformity Ext no LE edema / no wounds or ulcers Neuro is alert, Ox 3 , nf  CXR - clear  Home meds: -fluticasone/ salmetrol hfa/ coumadin 4 hs/ neurontin/ fosrenol ac/ pamelor/ prilosec/ requip/ sulfasalazine/ ultram prn  Dialysis: [facility] on  [facility] 3h 80min  Hep 2000   LUA AVF   256 lb edw    Impression: 1  SOB/ cough/ COPD exacerbation - in setting of influenza A+ 2  ESRD HD TTS 3  Past smoker 4  OSA 5  MBD cont fosrenol 6  Hx PE/  f5 leiden mutation, on coumadin 7  R lower abd wound 8  HTN off of all BP meds  9  Chronic pain   Plan - HD today, min UF  Kelly Splinter MD [employer] pager [phone]   10/24/2017, 1:33 PM

## 2017-10-24 NOTE — Progress Notes (Signed)
[facility] PROGRESS NOTE  Whitney Chavez OBS:[id number] DOB: Apr 19, 1956 DOA: 10/23/2017 PCP: Kathyrn Lass, MD  Assessment/Plan:  # COPD exacerbation - has not been formally diagnosed, but given extensive smoking history dx is probable. Asthma may also be component but pt says does symptoms started later in life, which favors copd. bnp is elevated but pt is esrd; trop is negative and no pulmonary edema on CXR and no LE edema, and pt denies hx chf, so think chf lower on diagnosis. PE also possible but therapeutic on coumadin, no abnormalities seen on CXR, no signs dvt on exam. Was reportedly hypoxic to mid-80s with ambulation at outside ED. Feels much better after treatment there, here sitting comfortably, lungs clear, no hypoxic on resting pulse ox. - continue methylprednisone 32 mg daily (reported allergy to prednisone), levqauin, and scheduled albuterol - substitute for home fluticasone/salmeterol - f/u flu pcr - monitor o2, supplement as needed  # ESRD on dialysis - no signs volume overload today, bp mildly elevated, no sig edema, K wnl. Dry weight 256 pounds, current weight pending. Missed dialysis today. - nephro consulted - continue home lanthanum - renally dose medications - daily weights  # Right lower abdominal wound - has had persistent drainage after biopsy 2 months ago after which received diagnosis of dercum's disease. Appears to have formed a fistula, is draining small amount of white fluid but no surrounding induration, no fluctuance. Has outpt derm f/u to address - daily dressing changes, monitor for infection  # OSA  - continue nightly cpap  # HTN - controlled with hemodialysis, is off oral antihypertensives - mild elevation here, continue to monitor  # restless leg syndrome - continue home requip  # insomnia - continue home nortryptyline  # U Colitis - pt unable to give more information about specific diagnosis but says has been well controlled with bid  sufasalazine - continue home sulfasalazine  # history PE, factory 5 leiden mutation - inr 2.21 - warfarin dosing per pharmacy given new medications and esrd  # chronic pain - continue home tramadol prn    DVT prophylaxis: therapeutic on coumadin; scds Code Status: full  Family Communication: friend gail yoder [phone]  Disposition Plan: tbd  Consults called: none  Admission status: med/surg     HPI/Subjective: No complaints.  Objective: Vitals:   10/24/17 0630 10/24/17 1309  BP: (!) 143/76   Pulse: 84   Resp: 18   Temp: 98.3 F (36.8 C)   SpO2: 94% 93%    Intake/Output Summary (Last 24 hours) at 10/24/2017 1341 Last data filed at 10/24/2017 0810 Gross per 24 hour  Intake 360 ml  Output 1 ml  Net 359 ml   Filed Weights   10/23/17 2054  Weight: 115.4 kg (254 lb 6.6 oz)    Exam:   General:  NAD, NCAT  Cardiovascular: RRR, no MRG  Respiratory: diffuse wheeze, nl wob  Abdomen: BS+, NTTP  Musculoskeletal: moving all extr   Data Reviewed: Basic Metabolic Panel: Recent Labs  Lab 10/23/17 1058 10/24/17 0544  NA 135 132*  K 4.7 4.1  CL 97* 95*  CO2 25 20*  GLUCOSE 102* 114*  BUN 43* 49*  CREATININE 6.43* 7.32*  CALCIUM 9.1 9.0   Liver Function Tests: Recent Labs  Lab 10/23/17 1058  AST 29  ALT 12*  ALKPHOS 59  BILITOT 0.8  PROT 7.2  ALBUMIN 4.0   No results for input(s): LIPASE, AMYLASE in the last 168 hours. No results for input(s): AMMONIA in  the last 168 hours. CBC: Recent Labs  Lab 10/23/17 1058  WBC 4.9  NEUTROABS 3.9  HGB 9.0*  HCT 28.3*  MCV 93.7  PLT 178   Cardiac Enzymes: Recent Labs  Lab 10/23/17 1058  TROPONINI <0.03   BNP (last 3 results) Recent Labs    10/23/17 1058  BNP 434.6*    ProBNP (last 3 results) No results for input(s): PROBNP in the last 8760 hours.  CBG: No results for input(s): GLUCAP in the last 168 hours.  No results found for this or any previous visit (from the past 240  hour(s)).   Studies: Dg Chest 2 View  Result Date: 10/23/2017 CLINICAL DATA:  Cough, congestion and shortness of breath since yesterday. EXAM: CHEST  2 VIEW COMPARISON:  None. FINDINGS: Cardiac silhouette is mildly enlarged. No mediastinal or hilar masses. No convincing adenopathy. Lungs are hyperexpanded. Prominent cardiophrenic angle fat pad at the medial right lung base. No evidence of pneumonia. No pulmonary edema. No convincing pleural effusion and no pneumothorax. Skeletal structures are intact. IMPRESSION: 1. No acute cardiopulmonary disease. 2. Mild cardiomegaly. Electronically Signed   By: Lajean Manes M.D.   On: 10/23/2017 11:21    Scheduled Meds: . albuterol  2.5 mg Nebulization QID  . gabapentin  300 mg Oral QHS  . lanthanum  2,000 mg Oral BID PC  . [START ON 10/25/2017] levofloxacin  500 mg Oral Q48H  . methylPREDNISolone  32 mg Oral Daily  . mometasone-formoterol  2 puff Inhalation BID  . nortriptyline  20 mg Oral QHS  . [START ON 10/26/2017] oseltamivir  30 mg Oral Q M,W,F-2000  . rOPINIRole  0.5 mg Oral Daily  . rOPINIRole  2 mg Oral QHS  . sulfaSALAzine  2,000 mg Oral BID  . Warfarin - Pharmacist Dosing Inpatient   Does not apply q1800   Continuous Infusions:  Active Problems:   Obstructive sleep apnea   Insomnia   Polyneuropathy   Restless leg   Benign hypertension   Long term current use of anticoagulant   End-stage renal disease on hemodialysis (HCC)   Factor V Leiden mutation (HCC)   Morbid (severe) obesity due to excess calories ([place])   Apnea, sleep   COPD with acute exacerbation (HCC)   Hypoxia   Dercum disease   Open abdominal wall wound   Colitis   Normocytic anemia   COPD exacerbation ([place])   Influenza A    Time spent: [place] [place] [facility] Pager AMION. If 7PM-7AM, please contact night-coverage at [email or web address], password [facility] 10/24/2017, 1:41 PM  LOS: 1 day

## 2017-10-24 NOTE — Progress Notes (Signed)
ANTICOAGULATION CONSULT NOTE - Follow Up Consult  Pharmacy Consult for Coumadin Indication: hx of PEand f5 leiden mutation   Allergies  Allergen Reactions  . Estrogens Other (See Comments)    Blood clots  . Prednisone Other (See Comments)    Facial edema   . Other Other (See Comments)    Unable to take birth control pills due to clotting disorder/fim Fungal powder caused rash when on wound vac  . Tape Rash    Please use paper tape    Patient Measurements: Height: 5\' 1"  (154.9 cm) Weight: 254 lb 6.6 oz (115.4 kg) IBW/kg (Calculated) : 47.8  Vital Signs: Temp: 98.3 F (36.8 C) (02/09 0630) Temp Source: Oral (02/09 0630) BP: 143/76 (02/09 0630) Pulse Rate: 84 (02/09 0630)  Labs: Recent Labs    10/23/17 1058 10/24/17 0544  HGB 9.0*  --   HCT 28.3*  --   PLT 178  --   LABPROT 24.3* 33.5*  INR 2.21 3.33  CREATININE 6.43* 7.32*  TROPONINI <0.03  --     Estimated Creatinine Clearance: 9.5 mL/min (A) (by C-G formula based on SCr of 7.32 mg/dL (H)).  Assessment:  Anticoag: hx of PEand f5 leiden mutation on warfarin PTA. Admit INR 2.21 now up to 3.33 today. Hgb 9. Plts 178 - PTA dose 4mg  daily (except 6mg  x 1 on 2/7). Admit INR 2.21  Goal of Therapy:  INR 2-3 Monitor platelets by anticoagulation protocol: Yes   Plan:  Coumadin hold today Daily INR  Alaycia Eardley S. Alford Highland, PharmD, [place] Clinical Staff Pharmacist Pager [phone]  Eilene Ghazi Stillinger 10/24/2017,9:11 AM

## 2017-10-25 DIAGNOSIS — G4730 Sleep apnea, unspecified: Secondary | ICD-10-CM | POA: Diagnosis not present

## 2017-10-25 DIAGNOSIS — J101 Influenza due to other identified influenza virus with other respiratory manifestations: Secondary | ICD-10-CM | POA: Diagnosis not present

## 2017-10-25 DIAGNOSIS — J441 Chronic obstructive pulmonary disease with (acute) exacerbation: Secondary | ICD-10-CM | POA: Diagnosis not present

## 2017-10-25 DIAGNOSIS — R0902 Hypoxemia: Secondary | ICD-10-CM | POA: Diagnosis not present

## 2017-10-25 LAB — RENAL FUNCTION PANEL
Albumin: 3.8 g/dL (ref 3.5–5.0)
Anion gap: 19 — ABNORMAL HIGH (ref 5–15)
BUN: 68 mg/dL — ABNORMAL HIGH (ref 6–20)
CO2: 18 mmol/L — ABNORMAL LOW (ref 22–32)
Calcium: 9.3 mg/dL (ref 8.9–10.3)
Chloride: 97 mmol/L — ABNORMAL LOW (ref 101–111)
Creatinine, Ser: 8.43 mg/dL — ABNORMAL HIGH (ref 0.44–1.00)
GFR calc Af Amer: 5 mL/min — ABNORMAL LOW (ref >60)
GFR calc non Af Amer: 5 mL/min — ABNORMAL LOW (ref >60)
Glucose, Bld: 95 mg/dL (ref 65–99)
Phosphorus: 6.1 mg/dL — ABNORMAL HIGH (ref 2.5–4.6)
Potassium: 4.2 mmol/L (ref 3.5–5.1)
Sodium: 134 mmol/L — ABNORMAL LOW (ref 135–145)

## 2017-10-25 LAB — CBC WITH DIFFERENTIAL/PLATELET
Basophils Absolute: 0 10*3/uL (ref 0.0–0.1)
Basophils Relative: 0 %
Eosinophils Absolute: 0 10*3/uL (ref 0.0–0.7)
Eosinophils Relative: 0 %
HCT: 29.9 % — ABNORMAL LOW (ref 36.0–46.0)
Hemoglobin: 9.5 g/dL — ABNORMAL LOW (ref 12.0–15.0)
Lymphocytes Relative: 18 %
Lymphs Abs: 1 10*3/uL (ref 0.7–4.0)
MCH: 28.9 pg (ref 26.0–34.0)
MCHC: 31.8 g/dL (ref 30.0–36.0)
MCV: 90.9 fL (ref 78.0–100.0)
Monocytes Absolute: 0.5 10*3/uL (ref 0.1–1.0)
Monocytes Relative: 10 %
Neutro Abs: 3.8 10*3/uL (ref 1.7–7.7)
Neutrophils Relative %: 72 %
Platelets: 203 10*3/uL (ref 150–400)
RBC: 3.29 MIL/uL — ABNORMAL LOW (ref 3.87–5.11)
RDW: 16.3 % — ABNORMAL HIGH (ref 11.5–15.5)
WBC: 5.2 10*3/uL (ref 4.0–10.5)

## 2017-10-25 LAB — PROTIME-INR
INR: 3.59
Prothrombin Time: 35.6 seconds — ABNORMAL HIGH (ref 11.4–15.2)

## 2017-10-25 MED ORDER — OSELTAMIVIR PHOSPHATE 30 MG PO CAPS
30.0000 mg | ORAL_CAPSULE | ORAL | Status: AC
  Administered 2017-10-25: 30 mg via ORAL
  Filled 2017-10-25: qty 1

## 2017-10-25 MED ORDER — OSELTAMIVIR PHOSPHATE 30 MG PO CAPS: 30.0000 mg | ORAL_CAPSULE | ORAL | Status: DC

## 2017-10-25 MED ORDER — OSELTAMIVIR PHOSPHATE 30 MG PO CAPS
30.0000 mg | ORAL_CAPSULE | ORAL | Status: DC
  Filled 2017-10-25: qty 1

## 2017-10-25 MED ORDER — ALTEPLASE 2 MG IJ SOLR: 2.0000 mg | Freq: Once | INTRAMUSCULAR | Status: DC | PRN

## 2017-10-25 MED ORDER — LIDOCAINE HCL (PF) 1 % IJ SOLN: 5.0000 mL | INTRAMUSCULAR | Status: DC | PRN

## 2017-10-25 MED ORDER — HEPARIN SODIUM (PORCINE) 1000 UNIT/ML DIALYSIS: 1000.0000 [IU] | INTRAMUSCULAR | Status: DC | PRN

## 2017-10-25 MED ORDER — LIDOCAINE-PRILOCAINE 2.5-2.5 % EX CREA: 1.0000 "application " | TOPICAL_CREAM | CUTANEOUS | Status: DC | PRN

## 2017-10-25 MED ORDER — OSELTAMIVIR PHOSPHATE 30 MG PO CAPS: 30.0000 mg | ORAL_CAPSULE | ORAL | 0 refills | Status: AC

## 2017-10-25 MED ORDER — SODIUM CHLORIDE 0.9 % IV SOLN: 100.0000 mL | INTRAVENOUS | Status: DC | PRN

## 2017-10-25 MED ORDER — PENTAFLUOROPROP-TETRAFLUOROETH EX AERO: 1.0000 "application " | INHALATION_SPRAY | CUTANEOUS | Status: DC | PRN

## 2017-10-25 MED ORDER — HEPARIN SODIUM (PORCINE) 1000 UNIT/ML DIALYSIS
2000.0000 [IU] | Freq: Once | INTRAMUSCULAR | Status: AC
  Administered 2017-10-25: 2000 [IU] via INTRAVENOUS_CENTRAL

## 2017-10-25 MED ORDER — METHYLPREDNISOLONE 32 MG PO TABS: 32.0000 mg | ORAL_TABLET | Freq: Every day | ORAL | 0 refills | Status: AC

## 2017-10-25 NOTE — Progress Notes (Signed)
[facility] Progress Note  Subjective: feeling better, under dry wt, breathing better  Vitals:   10/25/17 0530 10/25/17 0600 10/25/17 0627 10/25/17 0706  BP: (!) 172/82 (!) 154/81 (!) 170/61 (!) 160/82  Pulse: 85 75 77 86  Resp: (!) 23 (!) 25 (!) 27 19  Temp:   98 F (36.7 C) 98.6 F (37 C)  TempSrc:   Oral Oral  SpO2: 95% 98% 95% 92%  Weight:   111.7 kg (246 lb 4.1 oz)   Height:        Inpatient medications: . albuterol  2.5 mg Nebulization QID  . gabapentin  300 mg Oral QHS  . lanthanum  2,000 mg Oral BID PC  . levofloxacin  500 mg Oral Q48H  . methylPREDNISolone  32 mg Oral Daily  . mometasone-formoterol  2 puff Inhalation BID  . nortriptyline  20 mg Oral QHS  . [START ON 10/26/2017] oseltamivir  30 mg Oral Q M,W,F-2000  . rOPINIRole  0.5 mg Oral Daily  . rOPINIRole  2 mg Oral QHS  . sulfaSALAzine  2,000 mg Oral BID  . Warfarin - Pharmacist Dosing Inpatient   Does not apply q1800   . sodium chloride    . sodium chloride     sodium chloride, sodium chloride, albuterol, alteplase, heparin, hydrALAZINE, lidocaine (PF), lidocaine-prilocaine, pentafluoroprop-tetrafluoroeth, traMADol  Exam: Gen obese WF , no distress Runny nose, congested, coughing, raspy cough Sclera anicteric, throat clear No jvd or bruits Chest decreased wheezing, better air movement, occ cough RRR no MRG Abd soft ntnd no mass or ascites +bs obese GU defer MS no joint effusions or deformity Ext no LE edema  Neuro is alert, Ox 3 , nf  CXR - clear  Home meds: -fluticasone/ salmetrol hfa/ coumadin 4 hs/ neurontin/ fosrenol ac/ pamelor/ prilosec/ requip/ sulfasalazine/ ultram prn  Dialysis: [facility] on [address] 3h 62min  Hep 2000   LUA AVF   256 lb edw    Impression: 1  SOB/ cough/ COPD exac/ flu A+ - improving 2  ESRD HD TTS, had HD overnight. Under dry wt, lower at dc 3  Past smoker 4  OSA 5  MBD cont fosrenol 6  Hx PE/ f5 leiden mutation, on coumadin 7  R lower  abd wound 8  HTN - not taking any BP meds at home 9  Chronic pain   Plan - next HD 2/12   Kelly Splinter MD [facility] pager [phone]   10/25/2017, 8:07 AM   Recent Labs  Lab 10/23/17 1058 10/24/17 0544 10/25/17 0134  NA 135 132* 134*  K 4.7 4.1 4.2  CL 97* 95* 97*  CO2 25 20* 18*  GLUCOSE 102* 114* 95  BUN 43* 49* 68*  CREATININE 6.43* 7.32* 8.43*  CALCIUM 9.1 9.0 9.3  PHOS  --   --  6.1*   Recent Labs  Lab 10/23/17 1058 10/25/17 0134  AST 29  --   ALT 12*  --   ALKPHOS 59  --   BILITOT 0.8  --   PROT 7.2  --   ALBUMIN 4.0 3.8   Recent Labs  Lab 10/23/17 1058 10/25/17 0134  WBC 4.9 5.2  NEUTROABS 3.9 3.8  HGB 9.0* 9.5*  HCT 28.3* 29.9*  MCV 93.7 90.9  PLT 178 203   Iron/TIBC/Ferritin/ %Sat No results found for: IRON, TIBC, FERRITIN, IRONPCTSAT

## 2017-10-25 NOTE — Progress Notes (Signed)
Patient has home CPAP at bedside. Filled chamber with distilled H20.  Patient states she is able to place herself on/off as needed.

## 2017-10-25 NOTE — Progress Notes (Signed)
ANTICOAGULATION CONSULT NOTE - Follow Up Consult  Pharmacy Consult for Coumadin Indication: hx of PEand f5 leiden mutation   Allergies  Allergen Reactions  . Estrogens Other (See Comments)    Blood clots  . Prednisone Other (See Comments)    Facial edema   . Other Other (See Comments)    Unable to take birth control pills due to clotting disorder/fim Fungal powder caused rash when on wound vac  . Tape Rash    Please use paper tape    Patient Measurements: Height: 5\' 1"  (154.9 cm) Weight: 246 lb 4.1 oz (111.7 kg) IBW/kg (Calculated) : 47.8  Vital Signs: Temp: 98.6 F (37 C) (02/10 0706) Temp Source: Oral (02/10 0706) BP: 160/82 (02/10 0706) Pulse Rate: 86 (02/10 0706)  Labs: Recent Labs    10/23/17 1058 10/24/17 0544 10/25/17 0134  HGB 9.0*  --  9.5*  HCT 28.3*  --  29.9*  PLT 178  --  203  LABPROT 24.3* 33.5* 35.6*  INR 2.21 3.33 3.59  CREATININE 6.43* 7.32* 8.43*  TROPONINI <0.03  --   --     Estimated Creatinine Clearance: 8.1 mL/min (A) (by C-G formula based on SCr of 8.43 mg/dL (H)).  Assessment:  Anticoag: hx of PEand f5 leiden mutation on warfarin PTA. Admit INR 2.21 now up to 3.33>3.59. Hgb 9.5. Plts 203 stable - PTA dose 4mg  daily (except 6mg  x 1 on 2/7). Admit INR 2.21  Goal of Therapy:  INR 2-3 Monitor platelets by anticoagulation protocol: Yes   Plan:  Coumadin hold today Daily INR Dose of Tamiflu today 2/10 (had HD overnight). Needs 1 more dose 2/12  Tim Corriher S. Alford Highland, PharmD, [facility] Clinical Staff Pharmacist Pager [phone]  Eilene Ghazi Stillinger 10/25/2017,9:19 AM

## 2017-10-25 NOTE — Progress Notes (Signed)
Spoke with Mariann Laster in Care Management at approx 18:21 with regards to pt needing O2 to go home. Was told that [place] is closed for the night so the O2 for home will happen tomorrow. Will notify MD

## 2017-10-25 NOTE — Discharge Summary (Addendum)
Physician Discharge Summary  Whitney Chavez WPY:[id number] DOB: [birth year] DOA: 10/23/2017  PCP: Kathyrn Lass, MD  Admit date: 10/23/2017 Discharge date: 10/25/2017  Time spent: 30 minutes  Recommendations for Outpatient Follow-up:  1. F/u with PCP in next 10 days   Discharge Diagnoses:  Active Problems:   Obstructive sleep apnea   Insomnia   Polyneuropathy   Restless leg   Benign hypertension   Long term current use of anticoagulant   End-stage renal disease on hemodialysis (HCC)   Factor V Leiden mutation (HCC)   Morbid (severe) obesity due to excess calories (HCC)   Apnea, sleep   COPD with acute exacerbation (HCC)   Hypoxia   Dercum disease   Open abdominal wall wound   Colitis   Normocytic anemia   COPD exacerbation ([place])   Influenza A   Discharge Condition: stable  Diet recommendation: Heart Healthy  [facility] Weights   10/23/17 2054 10/25/17 0208 10/25/17 0627  Weight: 115.4 kg (254 lb 6.6 oz) 113.2 kg (249 lb 9 oz) 111.7 kg (246 lb 4.1 oz)    History of present illness:  Whitney Chavez is a 62 y.o. female with medical history significant for esrd on dialysis, hx of pe and f5 leiden mutation on coumadin, morbid obesity, osa on cpap, former smoker, reactive airway disease, who presents with shortness of breath and cough.  In usual state of health until about 2 days ago when developed significant nasal congestion (now improved), and later cough that became productive, as well as shortness of breath with exertion. No chest pain or palpitations. No leg swelling but normal 2 pillow orthopnea now is 3-4 last few nights. Dry weight is 256 pounds. Denies history of heart failure or MI. Does have history of reactive airway disesase, says has not had a formal diagnosis but does take a daily inhaler. No history hospitalization or ed visit for breathing problems. No recent sick contacts. Vomited once after fit of coughing, otherwise no vomiting or diarrhea. No fevers. No body aches. No  blood in cough. No leg swelling.   ESRD on MWF dialysis, last dialized on Wednesday.  Notes had recent derm diagnosis of dercum's disease, has had constant drainage rlq of abdomen from biopsy site, no pain or surrounding redness.    Hospital Course:  Treated for COPD exacerbation with nebulized treatments and steroids.  Was checked which was negative.  Patient had scheduled regular dialysis.  Patient was qualified for home oxygen.  Discharged home.  Procedures: n/a Consultations:  Nephro  Discharge Exam: Vitals:   10/25/17 1250 10/25/17 1300  BP:  (!) 165/78  Pulse:  77  Resp:  20  Temp:  98.5 F (36.9 C)  SpO2: 91% 91%    General: NAD, NCAT Cardiovascular: RRR, no MRG, fistual bruit heard Respiratory: Nl wob, speaking in full sentences, [place]  Discharge Instructions   Discharge Instructions    Call MD for:  difficulty breathing, headache or visual disturbances   Complete by:  As directed    Call MD for:  persistant dizziness or light-headedness   Complete by:  As directed    Call MD for:  persistant nausea and vomiting   Complete by:  As directed    Call MD for:  temperature >100.4   Complete by:  As directed    Diet - low sodium heart healthy   Complete by:  As directed    Discharge instructions   Complete by:  As directed    Follow-up with your physician on Friday  Driving Restrictions   Complete by:  As directed    Must be cleared by your doctor   Increase activity slowly   Complete by:  As directed      Allergies as of 10/26/2017      Reactions   Estrogens Other (See Comments)   Blood clots   Prednisone Other (See Comments)   Facial edema    Other Other (See Comments)   Unable to take birth control pills due to clotting disorder/fim Fungal powder caused rash when on wound vac   Tape Rash   Please use paper tape      Medication List    STOP taking these medications   Fluticasone-Salmeterol 113-14 MCG/ACT Aepb     TAKE these medications    acetaminophen 325 MG tablet Commonly known as:  TYLENOL Take 325 mg by mouth daily as needed for headache (pain).   albuterol 108 (90 Base) MCG/ACT inhaler Commonly known as:  PROVENTIL HFA;VENTOLIN HFA Inhale 2 puffs into the lungs every 6 (six) hours as needed for wheezing or shortness of breath.   budesonide-formoterol 80-4.5 MCG/ACT inhaler Commonly known as:  SYMBICORT Inhale 2 puffs into the lungs 2 (two) times daily.   cetirizine 10 MG tablet Commonly known as:  ZYRTEC Take 10 mg by mouth daily.   gabapentin 100 MG capsule Commonly known as:  NEURONTIN Take 1 capsule (100 mg total) by mouth 4 (four) times daily. What changed:    how much to take  when to take this  additional instructions   lanthanum 1000 MG chewable tablet Commonly known as:  FOSRENOL Chew 250-500 mg by mouth See admin instructions. Chew 1/2 tablet (500 mg) by mouth three times daily with meals, chew 1/4 tablet (250 mg) with snacks   Melatonin 10 MG Tabs Take 10 mg by mouth at bedtime as needed (sleep).   nortriptyline 10 MG capsule Commonly known as:  PAMELOR Take 20 mg by mouth at bedtime.   omeprazole 20 MG capsule Commonly known as:  PRILOSEC Take 20 mg by mouth daily as needed (acid reflux).   PRESCRIPTION MEDICATION Inhale into the lungs at bedtime. CPAP with nasal pillow   RENAL VITAMIN PO Take 1 tablet by mouth every evening.   rOPINIRole 0.5 MG tablet Commonly known as:  REQUIP Take 1-2 tablets in the morning po. What changed:    how much to take  how to take this  when to take this  additional instructions   rOPINIRole 2 MG tablet Commonly known as:  REQUIP Take 1 tablet (2 mg total) by mouth at bedtime. What changed:  Another medication with the same name was changed. Make sure you understand how and when to take each.   sulfaSALAzine 500 MG tablet Commonly known as:  AZULFIDINE Take 1,000 mg by mouth 2 (two) times daily.   traMADol 50 MG tablet Commonly  known as:  ULTRAM Take 50 mg by mouth 2 (two) times daily as needed (pain).   warfarin 4 MG tablet Commonly known as:  COUMADIN Take 4 mg by mouth daily at 3 pm.     ASK your doctor about these medications   methylPREDNISolone 32 MG tablet Commonly known as:  MEDROL Take 1 tablet (32 mg total) by mouth daily for 3 days. Ask about: Should I take this medication?   oseltamivir 30 MG capsule Commonly known as:  TAMIFLU Take 1 capsule (30 mg total) by mouth every Tuesday, Thursday, Saturday, and Sunday for 3 doses. Ask about: Should  I take this medication?      Allergies  Allergen Reactions  . Estrogens Other (See Comments)    Blood clots  . Prednisone Other (See Comments)    Facial edema   . Other Other (See Comments)    Unable to take birth control pills due to clotting disorder/fim Fungal powder caused rash when on wound vac  . Tape Rash    Please use paper tape      The results of significant diagnostics from this hospitalization (including imaging, microbiology, ancillary and laboratory) are listed below for reference.    Significant Diagnostic Studies: Dg Chest 2 View  Result Date: 10/23/2017 CLINICAL DATA:  Cough, congestion and shortness of breath since yesterday. EXAM: CHEST  2 VIEW COMPARISON:  None. FINDINGS: Cardiac silhouette is mildly enlarged. No mediastinal or hilar masses. No convincing adenopathy. Lungs are hyperexpanded. Prominent cardiophrenic angle fat pad at the medial right lung base. No evidence of pneumonia. No pulmonary edema. No convincing pleural effusion and no pneumothorax. Skeletal structures are intact. IMPRESSION: 1. No acute cardiopulmonary disease. 2. Mild cardiomegaly. Electronically Signed   By: Lajean Manes M.D.   On: 10/23/2017 11:21    Microbiology: Recent Results (from the past 240 hour(s))  MRSA PCR Screening     Status: None   Collection Time: 10/24/17 12:20 PM  Result Value Ref Range Status   MRSA by PCR NEGATIVE NEGATIVE Final     Comment:        The GeneXpert MRSA Assay (FDA approved for NASAL specimens only), is one component of a comprehensive MRSA colonization surveillance program. It is not intended to diagnose MRSA infection nor to guide or monitor treatment for MRSA infections. Performed at [facility], [place] [address] [place], [place] [address]      Labs: Basic Metabolic Panel: Recent Labs  Lab 10/23/17 1058 10/24/17 0544 10/25/17 0134  NA 135 132* 134*  K 4.7 4.1 4.2  CL 97* 95* 97*  CO2 25 20* 18*  GLUCOSE 102* 114* 95  BUN 43* 49* 68*  CREATININE 6.43* 7.32* 8.43*  CALCIUM 9.1 9.0 9.3  PHOS  --   --  6.1*   Liver Function Tests: Recent Labs  Lab 10/23/17 1058 10/25/17 0134  AST 29  --   ALT 12*  --   ALKPHOS 59  --   BILITOT 0.8  --   PROT 7.2  --   ALBUMIN 4.0 3.8   No results for input(s): LIPASE, AMYLASE in the last 168 hours. No results for input(s): AMMONIA in the last 168 hours. CBC: Recent Labs  Lab 10/23/17 1058 10/25/17 0134  WBC 4.9 5.2  NEUTROABS 3.9 3.8  HGB 9.0* 9.5*  HCT 28.3* 29.9*  MCV 93.7 90.9  PLT 178 203   Cardiac Enzymes: Recent Labs  Lab 10/23/17 1058  TROPONINI <0.03   BNP: BNP (last 3 results) Recent Labs    10/23/17 1058  BNP 434.6*    ProBNP (last 3 results) No results for input(s): PROBNP in the last 8760 hours.  CBG: No results for input(s): GLUCAP in the last 168 hours.     Signed:  Elwin Mocha MD  FACP  [facility] 10/25/2017, 3:24 PM

## 2017-10-25 NOTE — Progress Notes (Signed)
HD tx completed @ 0600 w/o problem, UF goal met, blood rinsed back, VSS, report called to Eber Hong, RN

## 2017-10-25 NOTE — Progress Notes (Signed)
Patient refused her 2000 and 2200 medicine during my shift ,pt said she will take the medicines after hemodialysis, was upset and irritated because she has not had her HD done as at 2100, Pt. Daughter called earlier, requested for charge nurse, charge nurse intervene, nurse gave the new assigned nurse report and update. Will continue to monitor.

## 2017-10-25 NOTE — Progress Notes (Signed)
SATURATION QUALIFICATIONS: (This note is used to comply with regulatory documentation for home oxygen)  Patient Saturations on Room Air at Rest = 93%  Patient Saturations on Room Air while Ambulating = 87%   Please briefly explain why patient needs home oxygen:  Pt ambulated on RA for approximately 6 minutes and was dyspneic, pt stopped once during the walk d/t her breathing. Pt denied lightheadedness, dizziness or chest pain during the walk. Will notify MD

## 2017-10-25 NOTE — Progress Notes (Signed)
HD tx initiated via 15Gx2 w/o problem, pull/push/flush equally w/o problem, VSS w/ increased bp, will cont to monitor while on HD tx

## 2017-10-25 NOTE — Progress Notes (Signed)
RN received a call from HD. They will try to see the pt early am. Pt was very frustrated and tearful to hear it. Pt agreed to have her bedtime meds. Emotional support was given to the pt. RN will call HD early in the am.

## 2017-10-25 NOTE — Progress Notes (Signed)
RN received a call from HD. Report was given. The pt will be transferred there shortly.

## 2017-10-25 NOTE — Progress Notes (Signed)
[facility] PROGRESS NOTE  Whitney Chavez [id number]:[id number] DOB: [birth year] DOA: 10/23/2017 PCP: Kathyrn Lass, MD  Assessment/Plan:  # COPD exacerbation  - flu A pos - continue methylprednisone 32 mg daily (reported allergy to prednisone), levqauin, and scheduled albuterol - substitute for home fluticasone/salmeterol - f/u flu pcr - monitor o2, supplement as needed  - pt's home health company unable to deliver oxygen tonight so will be discharged in the morning  # ESRD on dialysis - no signs volume overload today, bp mildly elevated, no sig edema, K wnl. Dry weight 256 pounds, current weight pending. Missed dialysis today. - nephro consulted - continue home lanthanum - renally dose medications - daily weights  # Right lower abdominal wound - has had persistent drainage after biopsy 2 months ago after which received diagnosis of dercum's disease. Appears to have formed a fistula, is draining small amount of white fluid but no surrounding induration, no fluctuance. Has outpt derm f/u to address - daily dressing changes, monitor for infection  # OSA  - continue nightly cpap  # HTN - controlled with hemodialysis, is off oral antihypertensives - mild elevation here, continue to monitor  # restless leg syndrome - continue home requip  # insomnia - continue home nortryptyline  # U Colitis - pt unable to give more information about specific diagnosis but says has been well controlled with bid sufasalazine - continue home sulfasalazine  # history PE, factory 5 leiden mutation - inr 2.21 - warfarin dosing per pharmacy given new medications and esrd  # chronic pain - continue home tramadol prn    DVT prophylaxis: therapeutic on coumadin; scds Code Status: full  Family Communication: friend gail yoder [phone]  Disposition Plan: tbd  Consults called: none  Admission status: med/surg     HPI/Subjective: No complaints. Breathing  better.  Objective: Vitals:   10/25/17 1300 10/25/17 1613  BP: (!) 165/78   Pulse: 77   Resp: 20   Temp: 98.5 F (36.9 C)   SpO2: 91% 94%    Intake/Output Summary (Last 24 hours) at 10/25/2017 1826 Last data filed at 10/25/2017 1400 Gross per 24 hour  Intake 480 ml  Output 1509 ml  Net -1029 ml   Filed Weights   10/23/17 2054 10/25/17 0208 10/25/17 0627  Weight: 115.4 kg (254 lb 6.6 oz) 113.2 kg (249 lb 9 oz) 111.7 kg (246 lb 4.1 oz)    Exam:   General:  NAD, NCAT  Cardiovascular: RRR, no MRG  Respiratory: diffuse wheeze, nl wob  Abdomen: BS+, NTTP  Musculoskeletal: moving all extr   Data Reviewed: Basic Metabolic Panel: Recent Labs  Lab 10/23/17 1058 10/24/17 0544 10/25/17 0134  NA 135 132* 134*  K 4.7 4.1 4.2  CL 97* 95* 97*  CO2 25 20* 18*  GLUCOSE 102* 114* 95  BUN 43* 49* 68*  CREATININE 6.43* 7.32* 8.43*  CALCIUM 9.1 9.0 9.3  PHOS  --   --  6.1*   Liver Function Tests: Recent Labs  Lab 10/23/17 1058 10/25/17 0134  AST 29  --   ALT 12*  --   ALKPHOS 59  --   BILITOT 0.8  --   PROT 7.2  --   ALBUMIN 4.0 3.8   No results for input(s): LIPASE, AMYLASE in the last 168 hours. No results for input(s): AMMONIA in the last 168 hours. CBC: Recent Labs  Lab 10/23/17 1058 10/25/17 0134  WBC 4.9 5.2  NEUTROABS 3.9 3.8  HGB 9.0* 9.5*  HCT 28.3*  29.9*  MCV 93.7 90.9  PLT 178 203   Cardiac Enzymes: Recent Labs  Lab 10/23/17 1058  TROPONINI <0.03   BNP (last 3 results) Recent Labs    10/23/17 1058  BNP 434.6*    ProBNP (last 3 results) No results for input(s): PROBNP in the last 8760 hours.  CBG: No results for input(s): GLUCAP in the last 168 hours.  Recent Results (from the past 240 hour(s))  MRSA PCR Screening     Status: None   Collection Time: 10/24/17 12:20 PM  Result Value Ref Range Status   MRSA by PCR NEGATIVE NEGATIVE Final    Comment:        The GeneXpert MRSA Assay (FDA approved for NASAL specimens only), is  one component of a comprehensive MRSA colonization surveillance program. It is not intended to diagnose MRSA infection nor to guide or monitor treatment for MRSA infections. Performed at [facility], [place] [address], [place], [place] [address]      Studies: No results found.  Scheduled Meds: . albuterol  2.5 mg Nebulization QID  . gabapentin  300 mg Oral QHS  . lanthanum  2,000 mg Oral BID PC  . levofloxacin  500 mg Oral Q48H  . methylPREDNISolone  32 mg Oral Daily  . mometasone-formoterol  2 puff Inhalation BID  . nortriptyline  20 mg Oral QHS  . [START ON 10/27/2017] oseltamivir  30 mg Oral Q T,Th,S,Su  . rOPINIRole  0.5 mg Oral Daily  . rOPINIRole  2 mg Oral QHS  . sulfaSALAzine  2,000 mg Oral BID  . Warfarin - Pharmacist Dosing Inpatient   Does not apply q1800   Continuous Infusions: . sodium chloride    . sodium chloride      Active Problems:   Obstructive sleep apnea   Insomnia   Polyneuropathy   Restless leg   Benign hypertension   Long term current use of anticoagulant   End-stage renal disease on hemodialysis (HCC)   Factor V Leiden mutation (HCC)   Morbid (severe) obesity due to excess calories ([place])   Apnea, sleep   COPD with acute exacerbation (HCC)   Hypoxia   Dercum disease   Open abdominal wall wound   Colitis   Normocytic anemia   COPD exacerbation ([place])   Influenza A    Time spent: [facility] Pager AMION. If 7PM-7AM, please contact night-coverage at [email or web address], password [facility] 10/25/2017, 6:26 PM  LOS: 2 days

## 2017-10-26 LAB — RENAL FUNCTION PANEL
Albumin: 3.8 g/dL (ref 3.5–5.0)
Anion gap: 17 — ABNORMAL HIGH (ref 5–15)
BUN: 42 mg/dL — ABNORMAL HIGH (ref 6–20)
CO2: 24 mmol/L (ref 22–32)
Calcium: 9.3 mg/dL (ref 8.9–10.3)
Chloride: 95 mmol/L — ABNORMAL LOW (ref 101–111)
Creatinine, Ser: 6.05 mg/dL — ABNORMAL HIGH (ref 0.44–1.00)
GFR calc Af Amer: 8 mL/min — ABNORMAL LOW (ref >60)
GFR calc non Af Amer: 7 mL/min — ABNORMAL LOW (ref >60)
Glucose, Bld: 78 mg/dL (ref 65–99)
Phosphorus: 4.4 mg/dL (ref 2.5–4.6)
Potassium: 3.6 mmol/L (ref 3.5–5.1)
Sodium: 136 mmol/L (ref 135–145)

## 2017-10-26 LAB — CBC WITH DIFFERENTIAL/PLATELET
Basophils Absolute: 0 10*3/uL (ref 0.0–0.1)
Basophils Relative: 0 %
Eosinophils Absolute: 0 10*3/uL (ref 0.0–0.7)
Eosinophils Relative: 0 %
HCT: 31.5 % — ABNORMAL LOW (ref 36.0–46.0)
Hemoglobin: 9.8 g/dL — ABNORMAL LOW (ref 12.0–15.0)
Lymphocytes Relative: 32 %
Lymphs Abs: 1.5 10*3/uL (ref 0.7–4.0)
MCH: 28.8 pg (ref 26.0–34.0)
MCHC: 31.1 g/dL (ref 30.0–36.0)
MCV: 92.6 fL (ref 78.0–100.0)
Monocytes Absolute: 0.5 10*3/uL (ref 0.1–1.0)
Monocytes Relative: 12 %
Neutro Abs: 2.6 10*3/uL (ref 1.7–7.7)
Neutrophils Relative %: 56 %
Platelets: 215 10*3/uL (ref 150–400)
RBC: 3.4 MIL/uL — ABNORMAL LOW (ref 3.87–5.11)
RDW: 16.4 % — ABNORMAL HIGH (ref 11.5–15.5)
WBC: 4.6 10*3/uL (ref 4.0–10.5)

## 2017-10-26 LAB — PROTIME-INR
INR: 2.78
Prothrombin Time: 29.1 seconds — ABNORMAL HIGH (ref 11.4–15.2)

## 2017-10-26 MED ORDER — BUDESONIDE-FORMOTEROL FUMARATE 80-4.5 MCG/ACT IN AERO: 2.0000 | INHALATION_SPRAY | Freq: Two times a day (BID) | RESPIRATORY_TRACT | 12 refills | Status: DC

## 2017-10-26 MED ORDER — ALBUTEROL SULFATE HFA 108 (90 BASE) MCG/ACT IN AERS: 2.0000 | INHALATION_SPRAY | Freq: Four times a day (QID) | RESPIRATORY_TRACT | 2 refills | Status: DC | PRN

## 2017-10-26 NOTE — Care Management Note (Signed)
Case Management Note  Patient Details  Name: Whitney Chavez MRN: [id number] Date of Birth: 02/07/1956  Subjective/Objective:  62 yr old female admitted with COPD Exacerbation.              Action/Plan: Patient will need oxygen for Home use. Case manager has contacted [place] to provide.    Expected Discharge Date:  10/26/17               Expected Discharge Plan:  Home/Self Care  In-House Referral:  NA  Discharge planning Services  CM Consult  Post Acute Care Choice:  Durable Medical Equipment Choice offered to:  Patient  DME Arranged:  Oxygen DME Agency:  [place]:  NA [place] Agency:  NA  Status of Service:  Completed, signed off  If discussed at [place] of Stay Meetings, dates discussed:    Additional Comments:  Ninfa Meeker, RN 10/26/2017, 2:21 PM

## 2017-10-26 NOTE — Progress Notes (Signed)
SATURATION QUALIFICATIONS: (This note is used to comply with regulatory documentation for home oxygen)  Patient Saturations on Room Air at Rest = 89%  Patient Saturations on Room Air while Ambulating = 85%  Patient Saturations on 2 Liters of oxygen while Ambulating = 99%  Please briefly explain why patient needs home oxygen:

## 2017-10-26 NOTE — Consult Note (Addendum)
[place] Nurse wound consult note Reason for Consult: Consult requested for abd wound.  Pt states it is a chronic, nonhealing wound which has been present since a biopsy was performed several months ago. Wound type: Chronic full thickness wound to right lower abd; .2X.2 cm  Skin is hard to palpation surrounding the wound, below skin level Wound bed: Narrow tunnel; unable to visualize Drainage (amount, consistency, odor) Mod amt yellow drainage, no odor Periwound: intact skin surrounding Dressing procedure/placement/frequency: Continue present plan of care as previously ordered with dry gauze and tape. Pt has been followed by outpatient dermatology at [facility] prior to admission for Dercums disease, but would like to begin being followed by the outpatient wound care center at [facility].  This must be by physician referral; please order if desired prior to discharge.  Attempted to contact primary team by phone. Please re-consult if further assistance is needed.  Thank-you,  Julien Girt MSN, [place], [place], [place], [place]

## 2017-11-03 ENCOUNTER — Other Ambulatory Visit (HOSPITAL_COMMUNITY): Payer: Self-pay | Admitting: Respiratory Therapy

## 2017-11-03 DIAGNOSIS — Z87891 Personal history of nicotine dependence: Secondary | ICD-10-CM

## 2017-11-03 DIAGNOSIS — Z8709 Personal history of other diseases of the respiratory system: Secondary | ICD-10-CM

## 2017-11-03 DIAGNOSIS — J40 Bronchitis, not specified as acute or chronic: Secondary | ICD-10-CM

## 2017-11-12 ENCOUNTER — Other Ambulatory Visit (HOSPITAL_COMMUNITY)
Admission: RE | Admit: 2017-11-12 | Discharge: 2017-11-12 | Disposition: A | Discharge status: Home / Self Care | Payer: Medicare Other | Source: Other Acute Inpatient Hospital | Attending: Internal Medicine | Admitting: Internal Medicine

## 2017-11-12 ENCOUNTER — Encounter (HOSPITAL_BASED_OUTPATIENT_CLINIC_OR_DEPARTMENT_OTHER): Payer: Self-pay

## 2017-11-12 ENCOUNTER — Ambulatory Visit (HOSPITAL_COMMUNITY)
Admission: RE | Admit: 2017-11-12 | Discharge: 2017-11-12 | Disposition: A | Discharge status: Home / Self Care | Payer: Medicare Other | Source: Ambulatory Visit | Attending: Family Medicine | Admitting: Family Medicine

## 2017-11-12 ENCOUNTER — Encounter (HOSPITAL_BASED_OUTPATIENT_CLINIC_OR_DEPARTMENT_OTHER): Payer: Medicare Other | Attending: Internal Medicine

## 2017-11-12 DIAGNOSIS — J984 Other disorders of lung: Secondary | ICD-10-CM | POA: Diagnosis not present

## 2017-11-12 DIAGNOSIS — D6851 Activated protein C resistance: Secondary | ICD-10-CM | POA: Diagnosis not present

## 2017-11-12 DIAGNOSIS — Z8614 Personal history of Methicillin resistant Staphylococcus aureus infection: Secondary | ICD-10-CM | POA: Diagnosis not present

## 2017-11-12 DIAGNOSIS — Z8709 Personal history of other diseases of the respiratory system: Secondary | ICD-10-CM | POA: Insufficient documentation

## 2017-11-12 DIAGNOSIS — M792 Neuralgia and neuritis, unspecified: Secondary | ICD-10-CM | POA: Insufficient documentation

## 2017-11-12 DIAGNOSIS — J40 Bronchitis, not specified as acute or chronic: Secondary | ICD-10-CM | POA: Diagnosis present

## 2017-11-12 DIAGNOSIS — Z992 Dependence on renal dialysis: Secondary | ICD-10-CM | POA: Insufficient documentation

## 2017-11-12 DIAGNOSIS — D6859 Other primary thrombophilia: Secondary | ICD-10-CM | POA: Insufficient documentation

## 2017-11-12 DIAGNOSIS — Z86718 Personal history of other venous thrombosis and embolism: Secondary | ICD-10-CM | POA: Insufficient documentation

## 2017-11-12 DIAGNOSIS — Z87891 Personal history of nicotine dependence: Secondary | ICD-10-CM | POA: Diagnosis present

## 2017-11-12 DIAGNOSIS — N186 End stage renal disease: Secondary | ICD-10-CM | POA: Insufficient documentation

## 2017-11-12 DIAGNOSIS — Y838 Other surgical procedures as the cause of abnormal reaction of the patient, or of later complication, without mention of misadventure at the time of the procedure: Secondary | ICD-10-CM | POA: Diagnosis not present

## 2017-11-12 DIAGNOSIS — D631 Anemia in chronic kidney disease: Secondary | ICD-10-CM | POA: Insufficient documentation

## 2017-11-12 DIAGNOSIS — G4733 Obstructive sleep apnea (adult) (pediatric): Secondary | ICD-10-CM | POA: Diagnosis not present

## 2017-11-12 DIAGNOSIS — G894 Chronic pain syndrome: Secondary | ICD-10-CM | POA: Insufficient documentation

## 2017-11-12 DIAGNOSIS — T8189XA Other complications of procedures, not elsewhere classified, initial encounter: Secondary | ICD-10-CM | POA: Diagnosis present

## 2017-11-12 DIAGNOSIS — J449 Chronic obstructive pulmonary disease, unspecified: Secondary | ICD-10-CM | POA: Insufficient documentation

## 2017-11-12 DIAGNOSIS — I120 Hypertensive chronic kidney disease with stage 5 chronic kidney disease or end stage renal disease: Secondary | ICD-10-CM | POA: Diagnosis not present

## 2017-11-12 DIAGNOSIS — L98499 Non-pressure chronic ulcer of skin of other sites with unspecified severity: Secondary | ICD-10-CM | POA: Diagnosis present

## 2017-11-12 LAB — PULMONARY FUNCTION TEST
DL/VA % pred: 101 %
DL/VA: 4.48 ml/min/mmHg/L
DLCO unc % pred: 75 %
DLCO unc: 15.17 ml/min/mmHg
FEF 25-75 Pre: 0.93 L/sec
FEF2575-%Pred-Pre: 43 %
FEV1-%Pred-Pre: 58 %
FEV1-Pre: 1.31 L
FEV1FVC-%Pred-Pre: 95 %
FEV6-%Pred-Pre: 63 %
FEV6-Pre: 1.77 L
FEV6FVC-%Pred-Pre: 104 %
FVC-%Pred-Pre: 60 %
FVC-Pre: 1.77 L
Pre FEV1/FVC ratio: 74 %
Pre FEV6/FVC Ratio: 100 %
RV % pred: 106 %
RV: 1.99 L
TLC % pred: 84 %
TLC: 3.89 L

## 2017-11-15 LAB — AEROBIC CULTURE W GRAM STAIN (SUPERFICIAL SPECIMEN)

## 2017-11-15 LAB — AEROBIC CULTURE  (SUPERFICIAL SPECIMEN): Culture: NO GROWTH

## 2017-11-19 ENCOUNTER — Other Ambulatory Visit (HOSPITAL_COMMUNITY)
Admission: RE | Admit: 2017-11-19 | Discharge: 2017-11-19 | Disposition: A | Discharge status: Home / Self Care | Payer: Medicare Other | Source: Other Acute Inpatient Hospital | Attending: Internal Medicine | Admitting: Internal Medicine

## 2017-11-19 ENCOUNTER — Encounter (HOSPITAL_BASED_OUTPATIENT_CLINIC_OR_DEPARTMENT_OTHER): Payer: Medicare Other | Attending: Internal Medicine

## 2017-11-19 DIAGNOSIS — I10 Essential (primary) hypertension: Secondary | ICD-10-CM | POA: Diagnosis not present

## 2017-11-19 DIAGNOSIS — X58XXXD Exposure to other specified factors, subsequent encounter: Secondary | ICD-10-CM | POA: Diagnosis not present

## 2017-11-19 DIAGNOSIS — Y838 Other surgical procedures as the cause of abnormal reaction of the patient, or of later complication, without mention of misadventure at the time of the procedure: Secondary | ICD-10-CM | POA: Insufficient documentation

## 2017-11-19 DIAGNOSIS — Z86718 Personal history of other venous thrombosis and embolism: Secondary | ICD-10-CM | POA: Diagnosis not present

## 2017-11-19 DIAGNOSIS — T8189XA Other complications of procedures, not elsewhere classified, initial encounter: Secondary | ICD-10-CM | POA: Diagnosis present

## 2017-11-19 DIAGNOSIS — Z9989 Dependence on other enabling machines and devices: Secondary | ICD-10-CM | POA: Diagnosis not present

## 2017-11-19 DIAGNOSIS — D171 Benign lipomatous neoplasm of skin and subcutaneous tissue of trunk: Secondary | ICD-10-CM | POA: Diagnosis not present

## 2017-11-19 DIAGNOSIS — G4730 Sleep apnea, unspecified: Secondary | ICD-10-CM | POA: Diagnosis not present

## 2017-11-19 DIAGNOSIS — Z992 Dependence on renal dialysis: Secondary | ICD-10-CM | POA: Diagnosis not present

## 2017-11-19 DIAGNOSIS — S31103D Unspecified open wound of abdominal wall, right lower quadrant without penetration into peritoneal cavity, subsequent encounter: Secondary | ICD-10-CM | POA: Insufficient documentation

## 2017-11-19 DIAGNOSIS — L98418 Non-pressure chronic ulcer of buttock with other specified severity: Secondary | ICD-10-CM | POA: Diagnosis not present

## 2017-11-19 DIAGNOSIS — Z8614 Personal history of Methicillin resistant Staphylococcus aureus infection: Secondary | ICD-10-CM | POA: Diagnosis not present

## 2017-11-19 DIAGNOSIS — Z87891 Personal history of nicotine dependence: Secondary | ICD-10-CM | POA: Diagnosis not present

## 2017-11-24 LAB — AEROBIC/ANAEROBIC CULTURE W GRAM STAIN (SURGICAL/DEEP WOUND): Culture: NEGATIVE

## 2017-11-26 DIAGNOSIS — T8189XA Other complications of procedures, not elsewhere classified, initial encounter: Secondary | ICD-10-CM | POA: Diagnosis not present

## 2017-12-03 DIAGNOSIS — T8189XA Other complications of procedures, not elsewhere classified, initial encounter: Secondary | ICD-10-CM | POA: Diagnosis not present

## 2017-12-04 ENCOUNTER — Other Ambulatory Visit (HOSPITAL_COMMUNITY): Payer: Self-pay | Admitting: Respiratory Therapy

## 2017-12-04 DIAGNOSIS — J45909 Unspecified asthma, uncomplicated: Secondary | ICD-10-CM

## 2017-12-07 ENCOUNTER — Other Ambulatory Visit (HOSPITAL_COMMUNITY): Payer: Self-pay | Admitting: Respiratory Therapy

## 2017-12-10 DIAGNOSIS — T8189XA Other complications of procedures, not elsewhere classified, initial encounter: Secondary | ICD-10-CM | POA: Diagnosis not present

## 2017-12-15 ENCOUNTER — Encounter (HOSPITAL_COMMUNITY): Payer: Medicare Other

## 2017-12-24 ENCOUNTER — Encounter (HOSPITAL_COMMUNITY): Payer: Medicare Other

## 2017-12-24 ENCOUNTER — Encounter (HOSPITAL_BASED_OUTPATIENT_CLINIC_OR_DEPARTMENT_OTHER): Payer: Medicare Other | Attending: Internal Medicine

## 2017-12-24 ENCOUNTER — Other Ambulatory Visit (HOSPITAL_COMMUNITY)
Admission: RE | Admit: 2017-12-24 | Discharge: 2017-12-24 | Disposition: A | Discharge status: Home / Self Care | Payer: Medicare Other | Source: Other Acute Inpatient Hospital | Attending: Internal Medicine | Admitting: Internal Medicine

## 2017-12-24 DIAGNOSIS — I10 Essential (primary) hypertension: Secondary | ICD-10-CM | POA: Diagnosis not present

## 2017-12-24 DIAGNOSIS — L98418 Non-pressure chronic ulcer of buttock with other specified severity: Secondary | ICD-10-CM | POA: Diagnosis not present

## 2017-12-24 DIAGNOSIS — D171 Benign lipomatous neoplasm of skin and subcutaneous tissue of trunk: Secondary | ICD-10-CM | POA: Insufficient documentation

## 2017-12-24 DIAGNOSIS — X58XXXD Exposure to other specified factors, subsequent encounter: Secondary | ICD-10-CM | POA: Insufficient documentation

## 2017-12-24 DIAGNOSIS — E882 Lipomatosis, not elsewhere classified: Secondary | ICD-10-CM | POA: Diagnosis not present

## 2017-12-24 DIAGNOSIS — T8189XA Other complications of procedures, not elsewhere classified, initial encounter: Secondary | ICD-10-CM | POA: Diagnosis present

## 2017-12-24 DIAGNOSIS — L97128 Non-pressure chronic ulcer of left thigh with other specified severity: Secondary | ICD-10-CM | POA: Diagnosis present

## 2017-12-24 DIAGNOSIS — S31103D Unspecified open wound of abdominal wall, right lower quadrant without penetration into peritoneal cavity, subsequent encounter: Secondary | ICD-10-CM | POA: Diagnosis present

## 2017-12-24 DIAGNOSIS — Y838 Other surgical procedures as the cause of abnormal reaction of the patient, or of later complication, without mention of misadventure at the time of the procedure: Secondary | ICD-10-CM | POA: Insufficient documentation

## 2017-12-24 DIAGNOSIS — G4730 Sleep apnea, unspecified: Secondary | ICD-10-CM | POA: Diagnosis not present

## 2017-12-24 DIAGNOSIS — Z86718 Personal history of other venous thrombosis and embolism: Secondary | ICD-10-CM | POA: Diagnosis not present

## 2017-12-27 LAB — AEROBIC CULTURE W GRAM STAIN (SUPERFICIAL SPECIMEN): Culture: NO GROWTH

## 2017-12-29 ENCOUNTER — Ambulatory Visit (HOSPITAL_COMMUNITY)
Admission: RE | Admit: 2017-12-29 | Discharge: 2017-12-29 | Disposition: A | Discharge status: Home / Self Care | Payer: Medicare Other | Source: Ambulatory Visit | Attending: Family Medicine | Admitting: Family Medicine

## 2017-12-29 DIAGNOSIS — J45909 Unspecified asthma, uncomplicated: Secondary | ICD-10-CM | POA: Diagnosis not present

## 2017-12-29 LAB — PULMONARY FUNCTION TEST
FEF 25-75 Post: 0.76 L/sec
FEF 25-75 Pre: 0.96 L/sec
FEF2575-%Change-Post: -20 %
FEF2575-%Pred-Post: 35 %
FEF2575-%Pred-Pre: 45 %
FEV1-%Change-Post: -6 %
FEV1-%Pred-Post: 53 %
FEV1-%Pred-Pre: 58 %
FEV1-Post: 1.21 L
FEV1-Pre: 1.3 L
FEV1FVC-%Change-Post: 0 %
FEV1FVC-%Pred-Pre: 96 %
FEV6-%Change-Post: -7 %
FEV6-%Pred-Post: 57 %
FEV6-%Pred-Pre: 61 %
FEV6-Post: 1.6 L
FEV6-Pre: 1.73 L
FEV6FVC-%Pred-Post: 104 %
FEV6FVC-%Pred-Pre: 104 %
FVC-%Change-Post: -6 %
FVC-%Pred-Post: 55 %
FVC-%Pred-Pre: 59 %
FVC-Post: 1.63 L
FVC-Pre: 1.73 L
Post FEV1/FVC ratio: 74 %
Post FEV6/FVC ratio: 100 %
Pre FEV1/FVC ratio: 75 %
Pre FEV6/FVC Ratio: 100 %

## 2017-12-29 MED ORDER — METHACHOLINE 1 MG/ML NEB SOLN
2.0000 mL | Freq: Once | RESPIRATORY_TRACT | Status: AC
Start: 2017-12-29 — End: 2017-12-29
  Administered 2017-12-29: 2 mg via RESPIRATORY_TRACT
  Filled 2017-12-29: qty 2

## 2017-12-29 MED ORDER — ALBUTEROL SULFATE (2.5 MG/3ML) 0.083% IN NEBU
2.5000 mg | INHALATION_SOLUTION | Freq: Once | RESPIRATORY_TRACT | Status: AC
  Administered 2017-12-29: 2.5 mg via RESPIRATORY_TRACT

## 2017-12-29 MED ORDER — METHACHOLINE 16 MG/ML NEB SOLN
2.0000 mL | Freq: Once | RESPIRATORY_TRACT | Status: DC
  Filled 2017-12-29: qty 2

## 2017-12-29 MED ORDER — METHACHOLINE 0.0625 MG/ML NEB SOLN
2.0000 mL | Freq: Once | RESPIRATORY_TRACT | Status: AC
Start: 2017-12-29 — End: 2017-12-29
  Administered 2017-12-29: 0.125 mg via RESPIRATORY_TRACT
  Filled 2017-12-29: qty 2

## 2017-12-29 MED ORDER — SODIUM CHLORIDE 0.9 % IN NEBU
3.0000 mL | INHALATION_SOLUTION | Freq: Once | RESPIRATORY_TRACT | Status: AC
  Administered 2017-12-29: 3 mL via RESPIRATORY_TRACT
  Filled 2017-12-29: qty 3

## 2017-12-29 MED ORDER — METHACHOLINE 0.25 MG/ML NEB SOLN
2.0000 mL | Freq: Once | RESPIRATORY_TRACT | Status: AC
  Administered 2017-12-29: 0.5 mg via RESPIRATORY_TRACT
  Filled 2017-12-29: qty 2

## 2017-12-29 MED ORDER — METHACHOLINE 4 MG/ML NEB SOLN
2.0000 mL | Freq: Once | RESPIRATORY_TRACT | Status: AC
  Administered 2017-12-29: 8 mg via RESPIRATORY_TRACT
  Filled 2017-12-29: qty 2

## 2017-12-31 DIAGNOSIS — T8189XA Other complications of procedures, not elsewhere classified, initial encounter: Secondary | ICD-10-CM | POA: Diagnosis not present

## 2018-01-11 ENCOUNTER — Other Ambulatory Visit: Payer: Self-pay | Admitting: *Deleted

## 2018-01-11 DIAGNOSIS — D6851 Activated protein C resistance: Secondary | ICD-10-CM

## 2018-01-12 ENCOUNTER — Inpatient Hospital Stay: Payer: Medicare Other | Attending: Hematology & Oncology | Admitting: Hematology & Oncology

## 2018-01-12 ENCOUNTER — Inpatient Hospital Stay: Payer: Medicare Other

## 2018-01-12 ENCOUNTER — Encounter: Payer: Self-pay | Admitting: Hematology & Oncology

## 2018-01-12 ENCOUNTER — Telehealth: Payer: Self-pay | Admitting: *Deleted

## 2018-01-12 ENCOUNTER — Other Ambulatory Visit: Payer: Self-pay

## 2018-01-12 VITALS — BP 132/56 | HR 73 | Temp 98.1°F | Resp 16 | Ht 61.0 in | Wt 250.0 lb

## 2018-01-12 DIAGNOSIS — I82A19 Acute embolism and thrombosis of unspecified axillary vein: Secondary | ICD-10-CM

## 2018-01-12 DIAGNOSIS — D6851 Activated protein C resistance: Secondary | ICD-10-CM

## 2018-01-12 DIAGNOSIS — Z7901 Long term (current) use of anticoagulants: Secondary | ICD-10-CM | POA: Diagnosis not present

## 2018-01-12 DIAGNOSIS — Z79899 Other long term (current) drug therapy: Secondary | ICD-10-CM

## 2018-01-12 DIAGNOSIS — E882 Lipomatosis, not elsewhere classified: Secondary | ICD-10-CM

## 2018-01-12 DIAGNOSIS — Z992 Dependence on renal dialysis: Secondary | ICD-10-CM | POA: Diagnosis not present

## 2018-01-12 LAB — CBC WITH DIFFERENTIAL (CANCER CENTER ONLY)
Basophils Absolute: 0.1 10*3/uL (ref 0.0–0.1)
Basophils Relative: 2 %
Eosinophils Absolute: 0.6 10*3/uL — ABNORMAL HIGH (ref 0.0–0.5)
Eosinophils Relative: 9 %
HCT: 34.9 % (ref 34.8–46.6)
Hemoglobin: 10.6 g/dL — ABNORMAL LOW (ref 11.6–15.9)
Lymphocytes Relative: 21 %
Lymphs Abs: 1.3 10*3/uL (ref 0.9–3.3)
MCH: 29.4 pg (ref 26.0–34.0)
MCHC: 30.4 g/dL — ABNORMAL LOW (ref 32.0–36.0)
MCV: 96.9 fL (ref 81.0–101.0)
Monocytes Absolute: 0.4 10*3/uL (ref 0.1–0.9)
Monocytes Relative: 7 %
Neutro Abs: 3.7 10*3/uL (ref 1.5–6.5)
Neutrophils Relative %: 61 %
Platelet Count: 225 10*3/uL (ref 145–400)
RBC: 3.6 MIL/uL — ABNORMAL LOW (ref 3.70–5.32)
RDW: 17 % — ABNORMAL HIGH (ref 11.1–15.7)
WBC Count: 6.1 10*3/uL (ref 3.9–10.0)

## 2018-01-12 LAB — CMP (CANCER CENTER ONLY)
ALT: 8 U/L (ref 0–55)
AST: 23 U/L (ref 5–34)
Albumin: 3.9 g/dL (ref 3.5–5.0)
Alkaline Phosphatase: 72 U/L (ref 40–150)
Anion gap: 13 — ABNORMAL HIGH (ref 3–11)
BUN: 28 mg/dL — ABNORMAL HIGH (ref 7–26)
CO2: 27 mmol/L (ref 22–29)
Calcium: 9.5 mg/dL (ref 8.4–10.4)
Chloride: 99 mmol/L (ref 98–109)
Creatinine: 5.09 mg/dL (ref 0.60–1.10)
GFR, Est AFR Am: 10 mL/min — ABNORMAL LOW (ref >60)
GFR, Estimated: 8 mL/min — ABNORMAL LOW (ref >60)
Glucose, Bld: 90 mg/dL (ref 70–140)
Potassium: 4.4 mmol/L (ref 3.5–5.1)
Sodium: 139 mmol/L (ref 136–145)
Total Bilirubin: 0.4 mg/dL (ref 0.2–1.2)
Total Protein: 7.2 g/dL (ref 6.4–8.3)

## 2018-01-12 LAB — D-DIMER, QUANTITATIVE: D-Dimer, Quant: 0.65 ug/mL-FEU — ABNORMAL HIGH (ref 0.00–0.50)

## 2018-01-12 MED ORDER — APIXABAN 5 MG PO TABS: 5.0000 mg | ORAL_TABLET | Freq: Two times a day (BID) | ORAL | 12 refills | Status: DC

## 2018-01-12 NOTE — Telephone Encounter (Signed)
Critical Value Creatinine 5.1 Sarah [place] NP notified. No orders at this time 

## 2018-01-14 ENCOUNTER — Encounter (HOSPITAL_BASED_OUTPATIENT_CLINIC_OR_DEPARTMENT_OTHER): Payer: Medicare Other | Attending: Internal Medicine

## 2018-01-14 DIAGNOSIS — L97119 Non-pressure chronic ulcer of right thigh with unspecified severity: Secondary | ICD-10-CM | POA: Insufficient documentation

## 2018-01-14 DIAGNOSIS — G629 Polyneuropathy, unspecified: Secondary | ICD-10-CM | POA: Insufficient documentation

## 2018-01-14 DIAGNOSIS — X58XXXA Exposure to other specified factors, initial encounter: Secondary | ICD-10-CM | POA: Diagnosis not present

## 2018-01-14 DIAGNOSIS — I10 Essential (primary) hypertension: Secondary | ICD-10-CM | POA: Diagnosis not present

## 2018-01-14 DIAGNOSIS — E882 Lipomatosis, not elsewhere classified: Secondary | ICD-10-CM | POA: Diagnosis not present

## 2018-01-14 DIAGNOSIS — G4730 Sleep apnea, unspecified: Secondary | ICD-10-CM | POA: Insufficient documentation

## 2018-01-14 DIAGNOSIS — S31103A Unspecified open wound of abdominal wall, right lower quadrant without penetration into peritoneal cavity, initial encounter: Secondary | ICD-10-CM | POA: Insufficient documentation

## 2018-01-14 DIAGNOSIS — Z86718 Personal history of other venous thrombosis and embolism: Secondary | ICD-10-CM | POA: Insufficient documentation

## 2018-01-18 NOTE — Progress Notes (Signed)
Hematology and Oncology Follow Up Visit  Whitney Chavez [id number] October 25, 1955 62 y.o. 01/18/2018   Principle Diagnosis:   Recurrent thromboembolic disease  Heterozygous factor V Leiden mutation  Prothrombin II gene mutation  Current Therapy:    Eliquis 5 mg po BID     Interim History:  Whitney Chavez is back for follow-up.  She is still having issues.  She is being seen at [facility].  She has this rare skin condition.  She has Dercum's Disease.  she also has an issue with calcium.  She has Calciphylaxis.   she show me where this has happened.   I do not think she has had problems with blood clots.  She is on Eliquis.  This is been much more convenient for her.  She rated does not mind taking this.  She has had no obvious bleeding.  She has been hospitalized.  She is on hemodialysis.  She seems to be doing fairly well with this.  Her appetite is okay.  She has had no nausea or vomiting.  She is had no diarrhea.  Medications:  Current Outpatient Medications:  .  pentoxifylline (TRENTAL) 400 MG CR tablet, Take 400 mg by mouth., Disp: , Rfl:  .  acetaminophen (TYLENOL) 325 MG tablet, Take 325 mg by mouth daily as needed for headache (pain). , Disp: , Rfl:  .  albuterol (PROVENTIL HFA;VENTOLIN HFA) 108 (90 Base) MCG/ACT inhaler, Inhale 2 puffs into the lungs every 6 (six) hours as needed for wheezing or shortness of breath., Disp: 1 Inhaler, Rfl: 2 .  apixaban (ELIQUIS) 5 MG TABS tablet, Take 1 tablet (5 mg total) by mouth 2 (two) times daily., Disp: 60 tablet, Rfl: 12 .  B Complex-C-Folic Acid (RENAL VITAMIN PO), Take 1 tablet by mouth every evening., Disp: , Rfl:  .  budesonide-formoterol (SYMBICORT) 80-4.5 MCG/ACT inhaler, Inhale 2 puffs into the lungs 2 (two) times daily., Disp: 1 Inhaler, Rfl: 12 .  cetirizine (ZYRTEC) 10 MG tablet, Take 10 mg by mouth daily., Disp: , Rfl:  .  gabapentin (NEURONTIN) 100 MG capsule, Take 1 capsule (100 mg total) by mouth 4 (four) times daily. (Patient  taking differently: Take 100-300 mg by mouth 3 (three) times daily. Take one capsule (100 mg) by mouth mid afternoon and two capsules (200 mg) at bedtime OR take three capsules (300 mg) at bedtime), Disp: 360 capsule, Rfl: 3 .  lanthanum (FOSRENOL) 1000 MG chewable tablet, Chew 250-500 mg by mouth See admin instructions. Chew 1 tablet (500 mg) by mouth three times daily with meals, chew 1/4 tablet (250 mg) with snacks, Disp: , Rfl:  .  Melatonin 10 MG TABS, Take 10-20 mg by mouth at bedtime as needed (sleep). , Disp: , Rfl:  .  nortriptyline (PAMELOR) 10 MG capsule, Take 20 mg by mouth at bedtime. , Disp: , Rfl:  .  omeprazole (PRILOSEC) 20 MG capsule, Take 20 mg by mouth daily as needed (acid reflux). , Disp: , Rfl:  .  PRESCRIPTION MEDICATION, Inhale into the lungs at bedtime. CPAP with nasal pillow, Disp: , Rfl:  .  rOPINIRole (REQUIP) 0.5 MG tablet, Take 1-2 tablets in the morning po. (Patient taking differently: Take 0.5 mg by mouth daily at 3 pm. ), Disp: 180 tablet, Rfl: 3 .  rOPINIRole (REQUIP) 2 MG tablet, Take 1 tablet (2 mg total) by mouth at bedtime., Disp: 90 tablet, Rfl: 3 .  sulfaSALAzine (AZULFIDINE) 500 MG tablet, Take 1,000 mg by mouth 2 (two) times daily. ,  Disp: , Rfl:  .  traMADol (ULTRAM) 50 MG tablet, Take 50 mg by mouth 2 (two) times daily as needed (pain). , Disp: , Rfl:  .  warfarin (COUMADIN) 4 MG tablet, Take 4-5 mg by mouth daily at 3 pm. , Disp: , Rfl:   Allergies:  Allergies  Allergen Reactions  . Estrogens Other (See Comments)    Blood clots  . Prednisone Other (See Comments)    Facial edema   . Other Other (See Comments)    Unable to take birth control pills due to clotting disorder/fim Fungal powder caused rash when on wound vac  . Tape Rash    Please use paper tape    Past Medical History, Surgical history, Social history, and Family History were reviewed and updated.  Review of Systems: As in the interim history  Physical Exam:  height is 5\' 1"   (1.549 m) and weight is 250 lb (113.4 kg). Her oral temperature is 98.1 F (36.7 C). Her blood pressure is 132/56 (abnormal) and her pulse is 73. Her respiration is 16 and oxygen saturation is 98%.   Wt Readings from Last 3 Encounters:  01/12/18 250 lb (113.4 kg)  10/25/17 246 lb 4.1 oz (111.7 kg)  06/25/17 254 lb 8 oz (115.4 kg)     Well-developed and well-nourished white female in no obvious distress. Head and neck exam shows no ocular or oral lesions. There are no palpable cervical or supraclavicular lymph nodes. Lungs are clear bilaterally. Cardiac exam regular rate and rhythm with a normal S1 and S2. She does have a 3/6 systolic murmur which probably is her fistula. Abdomen is soft. She is somewhat obese. He can feel these firm masses subcutaneously. This is part of her Dercum's disease. Extremities shows no clubbing, cyanosis or edema. Neurological exam is nonfocal. Lab Results  Component Value Date   WBC 6.1 01/12/2018   HGB 10.6 (L) 01/12/2018   HCT 34.9 01/12/2018   MCV 96.9 01/12/2018   PLT 225 01/12/2018     Chemistry      Component Value Date/Time   NA 139 01/12/2018 1151   NA 144 06/25/2017 1146   K 4.4 01/12/2018 1151   K 4.1 06/25/2017 1146   CL 99 01/12/2018 1151   CL 103 06/25/2017 1146   CO2 27 01/12/2018 1151   CO2 31 06/25/2017 1146   BUN 28 (H) 01/12/2018 1151   BUN 27 (H) 06/25/2017 1146   CREATININE 5.09 (HH) 01/12/2018 1151   CREATININE 4.7 (HH) 06/25/2017 1146      Component Value Date/Time   CALCIUM 9.5 01/12/2018 1151   CALCIUM 9.7 06/25/2017 1146   ALKPHOS 72 01/12/2018 1151   ALKPHOS 60 06/25/2017 1146   AST 23 01/12/2018 1151   ALT 8 01/12/2018 1151   ALT 14 06/25/2017 1146   BILITOT 0.4 01/12/2018 1151         Impression and Plan: Whitney Chavez is  a 62 year old white female. She has history of recurrent thrombosis. She has both factor V Leiden and prothrombin II G mutation.  She is on Eliquis.  We will plan to get her back in 3 weeks.   I just  want to see how she is doing.  Volanda Napoleon, MD 5/6/20195:23 PM

## 2018-01-28 DIAGNOSIS — S31103A Unspecified open wound of abdominal wall, right lower quadrant without penetration into peritoneal cavity, initial encounter: Secondary | ICD-10-CM | POA: Diagnosis not present

## 2018-02-04 ENCOUNTER — Other Ambulatory Visit: Payer: Self-pay

## 2018-02-04 ENCOUNTER — Inpatient Hospital Stay: Payer: Medicare Other

## 2018-02-04 ENCOUNTER — Inpatient Hospital Stay: Payer: Medicare Other | Attending: Hematology & Oncology | Admitting: Hematology & Oncology

## 2018-02-04 ENCOUNTER — Telehealth: Payer: Self-pay | Admitting: *Deleted

## 2018-02-04 VITALS — BP 141/73 | HR 79 | Temp 97.9°F | Resp 16 | Wt 255.0 lb

## 2018-02-04 DIAGNOSIS — Z7901 Long term (current) use of anticoagulants: Secondary | ICD-10-CM

## 2018-02-04 DIAGNOSIS — Z79899 Other long term (current) drug therapy: Secondary | ICD-10-CM | POA: Diagnosis not present

## 2018-02-04 DIAGNOSIS — Z992 Dependence on renal dialysis: Secondary | ICD-10-CM | POA: Diagnosis not present

## 2018-02-04 DIAGNOSIS — D6851 Activated protein C resistance: Secondary | ICD-10-CM

## 2018-02-04 DIAGNOSIS — I82A19 Acute embolism and thrombosis of unspecified axillary vein: Secondary | ICD-10-CM

## 2018-02-04 LAB — CMP (CANCER CENTER ONLY)
ALT: 18 U/L (ref 10–47)
AST: 24 U/L (ref 11–38)
Albumin: 3.6 g/dL (ref 3.5–5.0)
Alkaline Phosphatase: 69 U/L (ref 26–84)
Anion gap: 24 — ABNORMAL HIGH (ref 5–15)
BUN: 36 mg/dL — ABNORMAL HIGH (ref 7–22)
CO2: 27 mmol/L (ref 18–33)
Calcium: 9.3 mg/dL (ref 8.0–10.3)
Chloride: 98 mmol/L (ref 98–108)
Creatinine: 4.9 mg/dL (ref 0.60–1.20)
Glucose, Bld: 101 mg/dL (ref 73–118)
Potassium: 4.3 mmol/L (ref 3.3–4.7)
Sodium: 149 mmol/L — ABNORMAL HIGH (ref 128–145)
Total Bilirubin: 0.6 mg/dL (ref 0.2–1.6)
Total Protein: 7.1 g/dL (ref 6.4–8.1)

## 2018-02-04 LAB — CBC WITH DIFFERENTIAL (CANCER CENTER ONLY)
Basophils Absolute: 0 10*3/uL (ref 0.0–0.1)
Basophils Relative: 1 %
Eosinophils Absolute: 0.2 10*3/uL (ref 0.0–0.5)
Eosinophils Relative: 4 %
HCT: 34.5 % — ABNORMAL LOW (ref 34.8–46.6)
Hemoglobin: 10.9 g/dL — ABNORMAL LOW (ref 11.6–15.9)
Lymphocytes Relative: 25 %
Lymphs Abs: 1.4 10*3/uL (ref 0.9–3.3)
MCH: 29.5 pg (ref 26.0–34.0)
MCHC: 31.6 g/dL — ABNORMAL LOW (ref 32.0–36.0)
MCV: 93.2 fL (ref 81.0–101.0)
Monocytes Absolute: 0.5 10*3/uL (ref 0.1–0.9)
Monocytes Relative: 9 %
Neutro Abs: 3.4 10*3/uL (ref 1.5–6.5)
Neutrophils Relative %: 61 %
Platelet Count: 192 10*3/uL (ref 145–400)
RBC: 3.7 MIL/uL (ref 3.70–5.32)
RDW: 17.3 % — ABNORMAL HIGH (ref 11.1–15.7)
WBC Count: 5.5 10*3/uL (ref 3.9–10.0)

## 2018-02-04 NOTE — Telephone Encounter (Signed)
Critical Value Creatinine 4.9 Dr Ennever notified. No orders at this time.  

## 2018-02-04 NOTE — Progress Notes (Signed)
Hematology and Oncology Follow Up Visit  Whitney Chavez [id number] September 04, 1956 62 y.o. 02/04/2018   Principle Diagnosis:   Recurrent thromboembolic disease  Heterozygous factor V Leiden mutation  Prothrombin II gene mutation  Current Therapy:    Eliquis 5 mg po BID     Interim History:  Whitney Chavez is back for follow-up.  I saw her 3 weeks ago.  I just want of make sure we followed up with her now that she is on Eliquis.  So far, the Eliquis is doing well.  She enjoys taking Eliquis.  She can now have vegetables and salads..  She gets her dialysis 3 times a week.  She gets Aranesp at the dialysis center once a week on Wednesdays.  Apparently, she got sick with pentoxifylline.  As such, she is now off  Pentoxifylline.  She does feel better.  The calciphylaxis seems to be doing pretty well right now.  Hopefully now that she is off warfarin, this will improve.  She has had no fever.  There is been no leg swelling.  She has had no cough or shortness of breath.  Overall, her performance status is ECOG 1.   Medications:  Current Outpatient Medications:  .  acetaminophen (TYLENOL) 325 MG tablet, Take 325 mg by mouth daily as needed for headache (pain). , Disp: , Rfl:  .  albuterol (PROVENTIL HFA;VENTOLIN HFA) 108 (90 Base) MCG/ACT inhaler, Inhale 2 puffs into the lungs every 6 (six) hours as needed for wheezing or shortness of breath., Disp: 1 Inhaler, Rfl: 2 .  apixaban (ELIQUIS) 5 MG TABS tablet, Take 1 tablet (5 mg total) by mouth 2 (two) times daily., Disp: 60 tablet, Rfl: 12 .  B Complex-C-Folic Acid (RENAL VITAMIN PO), Take 1 tablet by mouth every evening., Disp: , Rfl:  .  budesonide-formoterol (SYMBICORT) 80-4.5 MCG/ACT inhaler, Inhale 2 puffs into the lungs 2 (two) times daily., Disp: 1 Inhaler, Rfl: 12 .  cetirizine (ZYRTEC) 10 MG tablet, Take 10 mg by mouth daily., Disp: , Rfl:  .  gabapentin (NEURONTIN) 100 MG capsule, Take 1 capsule (100 mg total) by mouth 4 (four) times  daily. (Patient taking differently: Take 100-300 mg by mouth 3 (three) times daily. Take one capsule (100 mg) by mouth mid afternoon and two capsules (200 mg) at bedtime OR take three capsules (300 mg) at bedtime), Disp: 360 capsule, Rfl: 3 .  lanthanum (FOSRENOL) 1000 MG chewable tablet, Chew 250-500 mg by mouth See admin instructions. Chew 1 tablet (500 mg) by mouth three times daily with meals, chew 1/4 tablet (250 mg) with snacks, Disp: , Rfl:  .  Melatonin 10 MG TABS, Take 10-20 mg by mouth at bedtime as needed (sleep). , Disp: , Rfl:  .  nortriptyline (PAMELOR) 10 MG capsule, Take 20 mg by mouth at bedtime. , Disp: , Rfl:  .  omeprazole (PRILOSEC) 20 MG capsule, Take 20 mg by mouth daily as needed (acid reflux). , Disp: , Rfl:  .  PRESCRIPTION MEDICATION, Inhale into the lungs at bedtime. CPAP with nasal pillow, Disp: , Rfl:  .  rOPINIRole (REQUIP) 0.5 MG tablet, Take 1-2 tablets in the morning po. (Patient taking differently: Take 0.5 mg by mouth daily at 3 pm. ), Disp: 180 tablet, Rfl: 3 .  rOPINIRole (REQUIP) 2 MG tablet, Take 1 tablet (2 mg total) by mouth at bedtime., Disp: 90 tablet, Rfl: 3 .  sulfaSALAzine (AZULFIDINE) 500 MG tablet, Take 1,000 mg by mouth 2 (two) times daily. ,  Disp: , Rfl:  .  traMADol (ULTRAM) 50 MG tablet, Take 50 mg by mouth 2 (two) times daily as needed (pain). , Disp: , Rfl:   Allergies:  Allergies  Allergen Reactions  . Estrogens Other (See Comments)    Blood clots  . Prednisone Other (See Comments)    Facial edema   . Other Other (See Comments)    Unable to take birth control pills due to clotting disorder/fim Fungal powder caused rash when on wound vac  . Tape Rash    Please use paper tape    Past Medical History, Surgical history, Social history, and Family History were reviewed and updated.  Review of Systems: Review of Systems  Constitutional: Negative.   HENT: Negative.   Eyes: Negative.   Respiratory: Negative.   Cardiovascular: Negative.    Gastrointestinal: Negative.   Genitourinary: Negative.   Musculoskeletal: Negative.   Skin: Positive for rash.  Neurological: Negative.   Endo/Heme/Allergies: Negative.   Psychiatric/Behavioral: Negative.      Physical Exam:  weight is 255 lb (115.7 kg). Her oral temperature is 97.9 F (36.6 C). Her blood pressure is 141/73 (abnormal) and her pulse is 79. Her respiration is 16 and oxygen saturation is 98%.   Wt Readings from Last 3 Encounters:  02/04/18 255 lb (115.7 kg)  01/12/18 250 lb (113.4 kg)  10/25/17 246 lb 4.1 oz (111.7 kg)     Physical Exam  Constitutional: She is oriented to person, place, and time.  HENT:  Head: Normocephalic and atraumatic.  Mouth/Throat: Oropharynx is clear and moist.  Eyes: Pupils are equal, round, and reactive to light. EOM are normal.  Neck: Normal range of motion.  Cardiovascular: Normal rate, regular rhythm and normal heart sounds.  Pulmonary/Chest: Effort normal and breath sounds normal.  Abdominal: Soft. Bowel sounds are normal.  Musculoskeletal: Normal range of motion. She exhibits no edema, tenderness or deformity.  Lymphadenopathy:    She has no cervical adenopathy.  Neurological: She is alert and oriented to person, place, and time.  Skin: Skin is warm and dry. No rash noted. No erythema.  Psychiatric: She has a normal mood and affect. Her behavior is normal. Judgment and thought content normal.  Vitals reviewed.   Lab Results  Component Value Date   WBC 5.5 02/04/2018   HGB 10.9 (L) 02/04/2018   HCT 34.5 (L) 02/04/2018   MCV 93.2 02/04/2018   PLT 192 02/04/2018     Chemistry      Component Value Date/Time   NA 149 (H) 02/04/2018 1506   NA 144 06/25/2017 1146   K 4.3 02/04/2018 1506   K 4.1 06/25/2017 1146   CL 98 02/04/2018 1506   CL 103 06/25/2017 1146   CO2 27 02/04/2018 1506   CO2 31 06/25/2017 1146   BUN 36 (H) 02/04/2018 1506   BUN 27 (H) 06/25/2017 1146   CREATININE 4.90 (HH) 02/04/2018 1506   CREATININE  4.7 (HH) 06/25/2017 1146      Component Value Date/Time   CALCIUM 9.3 02/04/2018 1506   CALCIUM 9.7 06/25/2017 1146   ALKPHOS 69 02/04/2018 1506   ALKPHOS 60 06/25/2017 1146   AST 24 02/04/2018 1506   ALT 18 02/04/2018 1506   ALT 14 06/25/2017 1146   BILITOT 0.6 02/04/2018 1506         Impression and Plan: Ms. Moldovan is a 62 year old white female. She has history of recurrent thrombosis. She has both factor V Leiden and prothrombin II gene mutation.  She  is on Eliquis.  I think that Eliquis will be a good idea for her.  At this point, I think we can probably get her back now in 3 months.  We can hopefully get her through most of the summer.   Volanda Napoleon, MD 5/23/20194:14 PM

## 2018-02-18 ENCOUNTER — Encounter (HOSPITAL_BASED_OUTPATIENT_CLINIC_OR_DEPARTMENT_OTHER): Payer: Medicare Other | Attending: Internal Medicine

## 2018-02-18 DIAGNOSIS — G629 Polyneuropathy, unspecified: Secondary | ICD-10-CM | POA: Diagnosis not present

## 2018-02-18 DIAGNOSIS — D171 Benign lipomatous neoplasm of skin and subcutaneous tissue of trunk: Secondary | ICD-10-CM | POA: Diagnosis present

## 2018-02-18 DIAGNOSIS — E882 Lipomatosis, not elsewhere classified: Secondary | ICD-10-CM | POA: Diagnosis not present

## 2018-02-18 DIAGNOSIS — G4730 Sleep apnea, unspecified: Secondary | ICD-10-CM | POA: Diagnosis not present

## 2018-02-18 DIAGNOSIS — Z86718 Personal history of other venous thrombosis and embolism: Secondary | ICD-10-CM | POA: Diagnosis not present

## 2018-02-18 DIAGNOSIS — L989 Disorder of the skin and subcutaneous tissue, unspecified: Secondary | ICD-10-CM | POA: Insufficient documentation

## 2018-02-18 DIAGNOSIS — L03311 Cellulitis of abdominal wall: Secondary | ICD-10-CM | POA: Diagnosis not present

## 2018-02-18 DIAGNOSIS — L97128 Non-pressure chronic ulcer of left thigh with other specified severity: Secondary | ICD-10-CM | POA: Diagnosis not present

## 2018-02-18 DIAGNOSIS — I10 Essential (primary) hypertension: Secondary | ICD-10-CM | POA: Diagnosis not present

## 2018-03-04 ENCOUNTER — Other Ambulatory Visit (HOSPITAL_COMMUNITY)
Admission: RE | Admit: 2018-03-04 | Discharge: 2018-03-04 | Disposition: A | Discharge status: Home / Self Care | Payer: Medicare Other | Source: Ambulatory Visit | Attending: Internal Medicine | Admitting: Internal Medicine

## 2018-03-04 DIAGNOSIS — S31103D Unspecified open wound of abdominal wall, right lower quadrant without penetration into peritoneal cavity, subsequent encounter: Secondary | ICD-10-CM | POA: Insufficient documentation

## 2018-03-04 DIAGNOSIS — D171 Benign lipomatous neoplasm of skin and subcutaneous tissue of trunk: Secondary | ICD-10-CM | POA: Diagnosis not present

## 2018-03-04 DIAGNOSIS — X58XXXD Exposure to other specified factors, subsequent encounter: Secondary | ICD-10-CM | POA: Insufficient documentation

## 2018-03-07 LAB — AEROBIC CULTURE W GRAM STAIN (SUPERFICIAL SPECIMEN): Culture: NORMAL

## 2018-03-07 LAB — AEROBIC CULTURE  (SUPERFICIAL SPECIMEN)

## 2018-03-09 DIAGNOSIS — D171 Benign lipomatous neoplasm of skin and subcutaneous tissue of trunk: Secondary | ICD-10-CM | POA: Diagnosis not present

## 2018-04-01 ENCOUNTER — Ambulatory Visit (INDEPENDENT_AMBULATORY_CARE_PROVIDER_SITE_OTHER): Payer: Medicare Other | Admitting: Neurology

## 2018-04-01 ENCOUNTER — Encounter: Payer: Self-pay | Admitting: Neurology

## 2018-04-01 ENCOUNTER — Other Ambulatory Visit: Payer: Self-pay

## 2018-04-01 VITALS — BP 136/68 | HR 78 | Resp 22 | Ht 61.0 in | Wt 251.0 lb

## 2018-04-01 DIAGNOSIS — G4733 Obstructive sleep apnea (adult) (pediatric): Secondary | ICD-10-CM | POA: Diagnosis not present

## 2018-04-01 DIAGNOSIS — M2141 Flat foot [pes planus] (acquired), right foot: Secondary | ICD-10-CM

## 2018-04-01 DIAGNOSIS — N186 End stage renal disease: Secondary | ICD-10-CM | POA: Diagnosis not present

## 2018-04-01 DIAGNOSIS — G2581 Restless legs syndrome: Secondary | ICD-10-CM | POA: Diagnosis not present

## 2018-04-01 DIAGNOSIS — R269 Unspecified abnormalities of gait and mobility: Secondary | ICD-10-CM

## 2018-04-01 DIAGNOSIS — M2142 Flat foot [pes planus] (acquired), left foot: Secondary | ICD-10-CM | POA: Diagnosis not present

## 2018-04-01 DIAGNOSIS — M2140 Flat foot [pes planus] (acquired), unspecified foot: Secondary | ICD-10-CM | POA: Insufficient documentation

## 2018-04-01 DIAGNOSIS — M792 Neuralgia and neuritis, unspecified: Secondary | ICD-10-CM | POA: Diagnosis not present

## 2018-04-01 DIAGNOSIS — Z992 Dependence on renal dialysis: Secondary | ICD-10-CM

## 2018-04-01 DIAGNOSIS — G629 Polyneuropathy, unspecified: Secondary | ICD-10-CM | POA: Diagnosis not present

## 2018-04-01 MED ORDER — GABAPENTIN 100 MG PO CAPS: 100.0000 mg | ORAL_CAPSULE | Freq: Four times a day (QID) | ORAL | 3 refills | Status: DC

## 2018-04-01 MED ORDER — ROPINIROLE HCL 2 MG PO TABS: 2.0000 mg | ORAL_TABLET | Freq: Every day | ORAL | 3 refills | Status: DC

## 2018-04-01 MED ORDER — ROPINIROLE HCL 0.5 MG PO TABS: ORAL_TABLET | ORAL | 3 refills | Status: DC

## 2018-04-01 NOTE — Progress Notes (Signed)
[facility]  PATIENT: Whitney Chavez DOB: 06-05-1956  REFERRING DOCTOR OR PCP:  none SOURCE: patient and records from [facility]  _________________________________   HISTORICAL  CHIEF COMPLAINT:  Chief Complaint  Patient presents with  . Polyneuropathy    Reports continued compliance with CPAP.  Continues dialysis. Sts. neuropathy and RLS sx.  have been worse, and she is now seeing pain mx. at [facility]. Has been dx. with Dercum's Dz.  Has a nonhealing wound on her abd. from bx. to dx. Dercum's dz so is also seeing wound care at [facility]  . Sleep Apnea  . RLS    HISTORY OF PRESENT ILLNESS:  Whitney Chavez is a 62 year old woman with Obstructive sleep apnea , insomnia, restless leg syndrome and polyneuropathy.      Update 04/01/2017: She has had more RLS and neuropathy/.dysesthesia symptoms.    RLS is much worse with dialysis.       She is currently taking gabapentin 100 mg x 3 daily (one at dialysis)   She is on 1/2 mg ropinirole during the day and 2 mg at night.     She has calciphylaxis and has a lot of calcification issues with subcutaneous large nodules.   Also has Dercum's disease.   These issues have caused the lesions 'eaten' throug the skin and she has abdominal and hip lesions.   She is being treated for these issues.   She does wound care there.     She also sees pain management and is on tramadol.     Her severe OSA does well and she uses CPAP daily and can't sleep well without it.     RLS bothers her just mildly at .night.   Insomnia is variable, bad last night  She needs new orthotics for her shoes  From 03/31/2017: She is having more difficulty getting around her house to perform activities of daily living and would benefit from a power vehicle such as a scooter.  Restless leg syndrome/polyneuropathy:  She gets RLS, worse with HD.   A combination of gabapentin with ropinirole have greatly helped.   Her RLS was severe and occurred day and  night, worse after each HD session.   She feels she sleeps better with the RLS med's.     She has polyneuropathy with numbness up to her ankles and has been stable the past year. . A nerve conduction study/EMG February 2015 showed a length dependent sensory motor axonal polyneuropathy with active features in the lower leg history she had a superimposed mild-to-moderate right carpal tunnel syndrome.   She has special orthotics that help her foot pain some.    Gait:   Due to the polyneuropathy, she has a gait disturbance and has occasional falls. She uses a cane but has difficulty walking longer distances   OSA:  She is sleeping well with CPAP  +10 cm every night.   A PSG study February 2015 showed an AHI equals 44.7 with very poor sleep efficiency. Due to the poor sleep efficiency she had AutoPap as an outpatient to avoid another night in the laboratory.   She does not note much sleepiness but may lay nod off for a few minutes around 3 pm and then does well the rest of the day.     Insomnia:  She is sleeping much better on the combination of CPAP, gabapentin and ropinirole.  She has sleep maintenance more than sleep onset insomnia. She takes 400 mg gabapentin with benefit. Insomnia is  worse night after hemodialysis.  Other: She is on hemodialysis for CRF due to vitamin D toxicity. She has had anemia associated with ESRD.    She has painful lumps in her skin and subcutaneous tissue felt due to the Aranesp/Epogen.     REVIEW OF SYSTEMS: Constitutional: No fevers, chills, sweats, or change in appetite Eyes: No visual changes, double vision, eye pain Ear, nose and throat: No hearing loss, ear pain, nasal congestion, sore throat Cardiovascular: No chest pain, palpitations Respiratory: No shortness of breath at rest or with exertion.   No wheezes GastrointestinaI: No nausea, vomiting, diarrhea, abdominal pain, fecal incontinence Genitourinary: She is on hemodialysis. Musculoskeletal: No neck pain, back  pain Integumentary: No rash, pruritus, skin lesions Neurological: as above Psychiatric: No depression at this time.  No anxiety Endocrine: No palpitations, diaphoresis, change in appetite, change in weigh or increased thirst Hematologic/Lymphatic: No anemia, purpura, petechiae. Allergic/Immunologic: No itchy/runny eyes, nasal congestion, recent allergic reactions, rashes  ALLERGIES: Allergies  Allergen Reactions  . Estrogens Other (See Comments)    Blood clots  . Prednisone Other (See Comments)    Facial edema   . Other Other (See Comments)    Unable to take birth control pills due to clotting disorder/fim Fungal powder caused rash when on wound vac  . Tape Rash    Please use paper tape    HOME MEDICATIONS:  Current Outpatient Medications:  .  acetaminophen (TYLENOL) 325 MG tablet, Take 325 mg by mouth daily as needed for headache (pain). , Disp: , Rfl:  .  albuterol (PROVENTIL HFA;VENTOLIN HFA) 108 (90 Base) MCG/ACT inhaler, Inhale 2 puffs into the lungs every 6 (six) hours as needed for wheezing or shortness of breath., Disp: 1 Inhaler, Rfl: 2 .  apixaban (ELIQUIS) 5 MG TABS tablet, Take 1 tablet (5 mg total) by mouth 2 (two) times daily., Disp: 60 tablet, Rfl: 12 .  B Complex-C-Folic Acid (RENAL VITAMIN PO), Take 1 tablet by mouth every evening., Disp: , Rfl:  .  budesonide-formoterol (SYMBICORT) 160-4.5 MCG/ACT inhaler, Inhale into the lungs., Disp: , Rfl:  .  cetirizine (ZYRTEC) 10 MG tablet, Take 10 mg by mouth daily., Disp: , Rfl:  .  gabapentin (NEURONTIN) 100 MG capsule, Take 1 capsule (100 mg total) by mouth 4 (four) times daily. (Patient taking differently: Take 100-300 mg by mouth 3 (three) times daily. Take one capsule (100 mg) by mouth mid afternoon and two capsules (200 mg) at bedtime OR take three capsules (300 mg) at bedtime), Disp: 360 capsule, Rfl: 3 .  lanthanum (FOSRENOL) 1000 MG chewable tablet, Chew 250-500 mg by mouth See admin instructions. Chew 1 tablet (500  mg) by mouth three times daily with meals, chew 1/4 tablet (250 mg) with snacks, Disp: , Rfl:  .  Melatonin 10 MG TABS, Take 10-20 mg by mouth at bedtime as needed (sleep). , Disp: , Rfl:  .  nortriptyline (PAMELOR) 10 MG capsule, Take 20 mg by mouth at bedtime. , Disp: , Rfl:  .  omeprazole (PRILOSEC) 20 MG capsule, Take 20 mg by mouth daily as needed (acid reflux). , Disp: , Rfl:  .  PRESCRIPTION MEDICATION, Inhale into the lungs at bedtime. CPAP with nasal pillow, Disp: , Rfl:  .  rOPINIRole (REQUIP) 0.5 MG tablet, Take 1-2 tablets in the morning po. (Patient taking differently: Take 0.5 mg by mouth daily at 3 pm. ), Disp: 180 tablet, Rfl: 3 .  rOPINIRole (REQUIP) 2 MG tablet, Take 1 tablet (2 mg total)  by mouth at bedtime., Disp: 90 tablet, Rfl: 3 .  sulfaSALAzine (AZULFIDINE) 500 MG tablet, Take 1,000 mg by mouth 2 (two) times daily. , Disp: , Rfl:  .  traMADol (ULTRAM) 50 MG tablet, Take 50 mg by mouth 2 (two) times daily as needed (pain). , Disp: , Rfl:   PAST MEDICAL HISTORY: Past Medical History:  Diagnosis Date  . 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 Chronic kidney disease   . Hypertension   . MRSA (methicillin resistant staph aureus) culture positive   . Neuropathy   . Obstructive sleep apnea 03/29/2015  . OSA on CPAP   . Vision abnormalities     PAST SURGICAL HISTORY: Past Surgical History:  Procedure Laterality Date  . AV FISTULA PLACEMENT Left   . lumbar decompression fusion    . TONSILLECTOMY      FAMILY HISTORY: Family History  Problem Relation Age of Onset  . Diabetes Mother   . Congestive Heart Failure Mother   . COPD Mother   . Stroke Mother   . Neuropathy Mother   . Heart disease Father   . Stroke Father   . Cancer Brother   . Diabetes Brother   . Diabetes Brother   . Alcohol abuse Brother     SOCIAL HISTORY:  Social History   Socioeconomic History  . Marital status: Single    Spouse name: Not on file  . Number of children: Not on file    . Years of education: Not on file  . Highest education level: Not on file  Occupational History  . Not on file  Social Needs  . Financial resource strain: Not on file  . Food insecurity:    Worry: Not on file    Inability: Not on file  . Transportation needs:    Medical: Not on file    Non-medical: Not on file  Tobacco Use  . Smoking status: Former Research scientist (life sciences)  . Smokeless tobacco: Never Used  Substance and Sexual Activity  . Alcohol use: No    Alcohol/week: 0.0 oz  . Drug use: No  . Sexual activity: Not on file  Lifestyle  . Physical activity:    Days per week: Not on file    Minutes per session: Not on file  . Stress: Not on file  Relationships  . Social connections:    Talks on phone: Not on file    Gets together: Not on file    Attends religious service: Not on file    Active member of club or organization: Not on file    Attends meetings of clubs or organizations: Not on file    Relationship status: Not on file  . Intimate partner violence:    Fear of current or ex partner: Not on file    Emotionally abused: Not on file    Physically abused: Not on file    Forced sexual activity: Not on file  Other Topics Concern  . Not on file  Social History Narrative  . Not on file     PHYSICAL EXAM  Vitals:   04/01/18 1057  BP: 136/68  Pulse: 78  Resp: (!) 22  Weight: 251 lb (113.9 kg)  Height: 5\' 1"  (1.549 m)    Body mass index is 47.43 kg/m.   General: The patient is well-developed and well-nourished and in no acute distress   Neurologic Exam  Mental status: The patient is alert and oriented x 3 at the time of the examination. The patient has  apparent normal recent and remote memory, with an apparently normal attention span and concentration ability.   Speech is normal.  Cranial nerves: Extraocular muscles are intact. Facial strength and sensation is normal.  The tongue is midline, and the patient has symmetric elevation of the soft palate. No obvious hearing  deficits are noted.  Motor:  Muscle bulk is normal.   Tone is normal. Strength is normal in the arms. Strength is 4/5 in the extensor hallucis longus muscles and 4+/5 in the tibialis anterior bilaterally. .   Sensory: She has reduced sensation to vibration at the knees and absent sensation at the ankles.  There is reduced sensation to touch from the ankles down.  Coordination: Cerebellar testing reveals good finger-nose-finger bilaterally.  Heel-to-shin is poor.  Gait and station: Station is normal.   Gait is mildly wide.  She is unable to tandem walk.  The Romberg is positive. Reflexes: Deep tendon reflexes are 1+ in arms and absent in knees and ankles.        DIAGNOSTIC DATA (LABS, IMAGING, TESTING) - I reviewed patient records, labs, notes, testing and imaging myself where available.     ASSESSMENT AND PLAN  Polyneuropathy  Obstructive sleep apnea  Restless leg  Gait disturbance  Neuropathic pain  End-stage renal disease on hemodialysis (HCC)  Pes planus of both feet   1.    I will renew the gabapentin and ropinirole for her neuropathic pain restless leg syndrome.  She can take up to 400 mg gabapentin daily.   2.   She will continue CPAP +10 cm 3.   Foot orthotics.  4.    Return in 1 year or sooner if there are new or worsening neurologic symptoms.     Tacoya Altizer A. Felecia Shelling, MD, PhD, [facility]  3/97/6734, 19:37 AM Certified in Neurology, Clinical Neurophysiology, Sleep Medicine, Pain Medicine and Neuroimaging  [facility] [address] [place], [place] [address] [phone]

## 2018-05-04 ENCOUNTER — Inpatient Hospital Stay (HOSPITAL_BASED_OUTPATIENT_CLINIC_OR_DEPARTMENT_OTHER): Payer: Medicare Other | Admitting: Hematology & Oncology

## 2018-05-04 ENCOUNTER — Inpatient Hospital Stay: Payer: Medicare Other | Attending: Hematology & Oncology

## 2018-05-04 ENCOUNTER — Telehealth: Payer: Self-pay | Admitting: *Deleted

## 2018-05-04 ENCOUNTER — Other Ambulatory Visit: Payer: Self-pay

## 2018-05-04 VITALS — BP 145/57 | HR 70 | Temp 97.8°F | Resp 16 | Wt 250.5 lb

## 2018-05-04 DIAGNOSIS — Z7901 Long term (current) use of anticoagulants: Secondary | ICD-10-CM | POA: Insufficient documentation

## 2018-05-04 DIAGNOSIS — Z992 Dependence on renal dialysis: Secondary | ICD-10-CM

## 2018-05-04 DIAGNOSIS — E882 Lipomatosis, not elsewhere classified: Secondary | ICD-10-CM | POA: Insufficient documentation

## 2018-05-04 DIAGNOSIS — Z79899 Other long term (current) drug therapy: Secondary | ICD-10-CM | POA: Diagnosis not present

## 2018-05-04 DIAGNOSIS — I82A19 Acute embolism and thrombosis of unspecified axillary vein: Secondary | ICD-10-CM

## 2018-05-04 DIAGNOSIS — D6851 Activated protein C resistance: Secondary | ICD-10-CM | POA: Diagnosis present

## 2018-05-04 LAB — CBC WITH DIFFERENTIAL (CANCER CENTER ONLY)
Basophils Absolute: 0 10*3/uL (ref 0.0–0.1)
Basophils Relative: 1 %
Eosinophils Absolute: 0.2 10*3/uL (ref 0.0–0.5)
Eosinophils Relative: 4 %
HCT: 35.8 % (ref 34.8–46.6)
Hemoglobin: 11.2 g/dL — ABNORMAL LOW (ref 11.6–15.9)
Lymphocytes Relative: 13 %
Lymphs Abs: 0.7 10*3/uL — ABNORMAL LOW (ref 0.9–3.3)
MCH: 28.4 pg (ref 26.0–34.0)
MCHC: 31.3 g/dL — ABNORMAL LOW (ref 32.0–36.0)
MCV: 90.6 fL (ref 81.0–101.0)
Monocytes Absolute: 0.4 10*3/uL (ref 0.1–0.9)
Monocytes Relative: 7 %
Neutro Abs: 3.9 10*3/uL (ref 1.5–6.5)
Neutrophils Relative %: 75 %
Platelet Count: 185 10*3/uL (ref 145–400)
RBC: 3.95 MIL/uL (ref 3.70–5.32)
RDW: 18 % — ABNORMAL HIGH (ref 11.1–15.7)
WBC Count: 5.2 10*3/uL (ref 3.9–10.0)

## 2018-05-04 LAB — CMP (CANCER CENTER ONLY)
ALT: 20 U/L (ref 10–47)
AST: 27 U/L (ref 11–38)
Albumin: 3.8 g/dL (ref 3.5–5.0)
Alkaline Phosphatase: 85 U/L — ABNORMAL HIGH (ref 26–84)
Anion gap: 21 — ABNORMAL HIGH (ref 5–15)
BUN: 25 mg/dL — ABNORMAL HIGH (ref 7–22)
CO2: 26 mmol/L (ref 18–33)
Calcium: 9.4 mg/dL (ref 8.0–10.3)
Chloride: 96 mmol/L — ABNORMAL LOW (ref 98–108)
Creatinine: 4.7 mg/dL (ref 0.60–1.20)
Glucose, Bld: 86 mg/dL (ref 73–118)
Potassium: 4.7 mmol/L (ref 3.3–4.7)
Sodium: 143 mmol/L (ref 128–145)
Total Bilirubin: 0.6 mg/dL (ref 0.2–1.6)
Total Protein: 7.1 g/dL (ref 6.4–8.1)

## 2018-05-04 NOTE — Telephone Encounter (Signed)
Critical Value Creatinine 4.7 Dr Marin Olp notified. No orders at this time

## 2018-05-04 NOTE — Progress Notes (Signed)
Hematology and Oncology Follow Up Visit  Whitney Chavez [id number] Jan 07, 1956 62 y.o. 05/04/2018   Principle Diagnosis:   Recurrent thromboembolic disease  Heterozygous factor V Leiden mutation  Prothrombin II gene mutation  Dercum's disease  Calciphylaxis  Current Therapy:    Eliquis 5 mg po BID     Interim History:  Whitney Chavez is back for follow-up.  She is doing okay.  She is getting dialysis.  She gets this 3 times a week.  She is dealing with her Dercum's disease.  She has multiple areas of involvement.  She has had surgical wounds that just have not closed.  She goes to see a Psychiatric nurse tomorrow.  She is had no problems with bleeding.  She is doing well on the Eliquis.  She is not sure when she will have surgery.  I told her that the great thing about Eliquis is that she does not have to be "bridged" with Lovenox or heparin.  She just stops the Eliquis a couple days before surgery.  There is been no fever.  She has had no bleeding.  She has had no fever.  She is had no leg swelling.  Overall, I said her performance status is ECOG 1.   Medications:  Current Outpatient Medications:  .  acetaminophen (TYLENOL) 325 MG tablet, Take 325 mg by mouth daily as needed for headache (pain). , Disp: , Rfl:  .  albuterol (PROVENTIL HFA;VENTOLIN HFA) 108 (90 Base) MCG/ACT inhaler, Inhale 2 puffs into the lungs every 6 (six) hours as needed for wheezing or shortness of breath., Disp: 1 Inhaler, Rfl: 2 .  apixaban (ELIQUIS) 5 MG TABS tablet, Take 1 tablet (5 mg total) by mouth 2 (two) times daily., Disp: 60 tablet, Rfl: 12 .  B Complex-C-Folic Acid (RENAL VITAMIN PO), Take 1 tablet by mouth every evening., Disp: , Rfl:  .  budesonide-formoterol (SYMBICORT) 160-4.5 MCG/ACT inhaler, Inhale into the lungs., Disp: , Rfl:  .  cetirizine (ZYRTEC) 10 MG tablet, Take 10 mg by mouth daily., Disp: , Rfl:  .  gabapentin (NEURONTIN) 100 MG capsule, Take 1 capsule (100 mg total) by mouth 4  (four) times daily., Disp: 360 capsule, Rfl: 3 .  lanthanum (FOSRENOL) 1000 MG chewable tablet, Chew 250-500 mg by mouth See admin instructions. Chew 1 tablet (500 mg) by mouth three times daily with meals, chew 1/4 tablet (250 mg) with snacks, Disp: , Rfl:  .  Melatonin 10 MG TABS, Take 10-20 mg by mouth at bedtime as needed (sleep). , Disp: , Rfl:  .  nortriptyline (PAMELOR) 10 MG capsule, Take 20 mg by mouth at bedtime. , Disp: , Rfl:  .  omeprazole (PRILOSEC) 20 MG capsule, Take 20 mg by mouth daily as needed (acid reflux). , Disp: , Rfl:  .  PRESCRIPTION MEDICATION, Inhale into the lungs at bedtime. CPAP with nasal pillow, Disp: , Rfl:  .  rOPINIRole (REQUIP) 0.5 MG tablet, Take 1-2 tablets in the morning po., Disp: 180 tablet, Rfl: 3 .  rOPINIRole (REQUIP) 2 MG tablet, Take 1 tablet (2 mg total) by mouth at bedtime., Disp: 90 tablet, Rfl: 3 .  sulfaSALAzine (AZULFIDINE) 500 MG tablet, Take 1,000 mg by mouth 2 (two) times daily. , Disp: , Rfl:  .  traMADol (ULTRAM) 50 MG tablet, Take 50 mg by mouth 2 (two) times daily as needed (pain). , Disp: , Rfl:   Allergies:  Allergies  Allergen Reactions  . Estrogens Other (See Comments)    Blood  clots  . Prednisone Other (See Comments)    Facial edema   . Other Other (See Comments)    Unable to take birth control pills due to clotting disorder/fim Fungal powder caused rash when on wound vac  . Tape Rash    Please use paper tape    Past Medical History, Surgical history, Social history, and Family History were reviewed and updated.  Review of Systems: Review of Systems  Constitutional: Negative.   HENT: Negative.   Eyes: Negative.   Respiratory: Negative.   Cardiovascular: Negative.   Gastrointestinal: Negative.   Genitourinary: Negative.   Musculoskeletal: Negative.   Skin: Positive for rash.  Neurological: Negative.   Endo/Heme/Allergies: Negative.   Psychiatric/Behavioral: Negative.      Physical Exam:  weight is 250 lb 8 oz  (113.6 kg). Her oral temperature is 97.8 F (36.6 C). Her blood pressure is 145/57 (abnormal) and her pulse is 70. Her respiration is 16 and oxygen saturation is 98%.   Wt Readings from Last 3 Encounters:  05/04/18 250 lb 8 oz (113.6 kg)  04/01/18 251 lb (113.9 kg)  02/04/18 255 lb (115.7 kg)     Physical Exam  Constitutional: She is oriented to person, place, and time.  HENT:  Head: Normocephalic and atraumatic.  Mouth/Throat: Oropharynx is clear and moist.  Eyes: Pupils are equal, round, and reactive to light. EOM are normal.  Neck: Normal range of motion.  Cardiovascular: Normal rate, regular rhythm and normal heart sounds.  Pulmonary/Chest: Effort normal and breath sounds normal.  Abdominal: Soft. Bowel sounds are normal.  Musculoskeletal: Normal range of motion. She exhibits no edema, tenderness or deformity.  Lymphadenopathy:    She has no cervical adenopathy.  Neurological: She is alert and oriented to person, place, and time.  Skin: Skin is warm and dry. No rash noted. No erythema.  Psychiatric: She has a normal mood and affect. Her behavior is normal. Judgment and thought content normal.  Vitals reviewed.   Lab Results  Component Value Date   WBC 5.2 05/04/2018   HGB 11.2 (L) 05/04/2018   HCT 35.8 05/04/2018   MCV 90.6 05/04/2018   PLT 185 05/04/2018     Chemistry      Component Value Date/Time   NA 143 05/04/2018 0930   NA 144 06/25/2017 1146   K 4.7 05/04/2018 0930   K 4.1 06/25/2017 1146   CL 96 (L) 05/04/2018 0930   CL 103 06/25/2017 1146   CO2 26 05/04/2018 0930   CO2 31 06/25/2017 1146   BUN 25 (H) 05/04/2018 0930   BUN 27 (H) 06/25/2017 1146   CREATININE 4.70 (HH) 05/04/2018 0930   CREATININE 4.7 (HH) 06/25/2017 1146      Component Value Date/Time   CALCIUM 9.4 05/04/2018 0930   CALCIUM 9.7 06/25/2017 1146   ALKPHOS 85 (H) 05/04/2018 0930   ALKPHOS 60 06/25/2017 1146   AST 27 05/04/2018 0930   ALT 20 05/04/2018 0930   ALT 14 06/25/2017 1146     BILITOT 0.6 05/04/2018 0930         Impression and Plan: Whitney Chavez is a 62 year old white female. She has history of recurrent thrombosis. She has both factor V Leiden and prothrombin II gene mutation.  She is on Eliquis.  Eliquis is doing very well for her.  She needs lifelong Eliquis.  Again, whenever she needs surgery, she will let us know.  I will plan to see her back in 3 more months.  I would  like to see her back before the holidays to make sure that everything is doing okay.Marland Kitchen   Volanda Napoleon, MD 8/20/201910:14 AM

## 2018-05-06 ENCOUNTER — Other Ambulatory Visit: Payer: Medicare Other

## 2018-05-06 ENCOUNTER — Ambulatory Visit: Payer: Medicare Other | Admitting: Hematology & Oncology

## 2018-05-24 ENCOUNTER — Encounter (HOSPITAL_BASED_OUTPATIENT_CLINIC_OR_DEPARTMENT_OTHER): Payer: Self-pay

## 2018-05-24 ENCOUNTER — Emergency Department (HOSPITAL_BASED_OUTPATIENT_CLINIC_OR_DEPARTMENT_OTHER): Payer: Medicare Other

## 2018-05-24 ENCOUNTER — Other Ambulatory Visit: Payer: Self-pay

## 2018-05-24 ENCOUNTER — Emergency Department (HOSPITAL_BASED_OUTPATIENT_CLINIC_OR_DEPARTMENT_OTHER)
Admission: EM | Admit: 2018-05-24 | Discharge: 2018-05-25 | Disposition: A | Discharge status: Home / Self Care | Payer: Medicare Other | Attending: Emergency Medicine | Admitting: Emergency Medicine

## 2018-05-24 DIAGNOSIS — Z79899 Other long term (current) drug therapy: Secondary | ICD-10-CM | POA: Diagnosis not present

## 2018-05-24 DIAGNOSIS — T148XXA Other injury of unspecified body region, initial encounter: Secondary | ICD-10-CM

## 2018-05-24 DIAGNOSIS — X58XXXD Exposure to other specified factors, subsequent encounter: Secondary | ICD-10-CM | POA: Diagnosis not present

## 2018-05-24 DIAGNOSIS — Z87891 Personal history of nicotine dependence: Secondary | ICD-10-CM | POA: Insufficient documentation

## 2018-05-24 DIAGNOSIS — I120 Hypertensive chronic kidney disease with stage 5 chronic kidney disease or end stage renal disease: Secondary | ICD-10-CM | POA: Diagnosis not present

## 2018-05-24 DIAGNOSIS — L089 Local infection of the skin and subcutaneous tissue, unspecified: Secondary | ICD-10-CM | POA: Diagnosis not present

## 2018-05-24 DIAGNOSIS — S31103D Unspecified open wound of abdominal wall, right lower quadrant without penetration into peritoneal cavity, subsequent encounter: Secondary | ICD-10-CM | POA: Insufficient documentation

## 2018-05-24 DIAGNOSIS — Z992 Dependence on renal dialysis: Secondary | ICD-10-CM | POA: Insufficient documentation

## 2018-05-24 DIAGNOSIS — Z7901 Long term (current) use of anticoagulants: Secondary | ICD-10-CM | POA: Diagnosis not present

## 2018-05-24 DIAGNOSIS — J449 Chronic obstructive pulmonary disease, unspecified: Secondary | ICD-10-CM | POA: Insufficient documentation

## 2018-05-24 DIAGNOSIS — N186 End stage renal disease: Secondary | ICD-10-CM | POA: Insufficient documentation

## 2018-05-24 LAB — CBC WITH DIFFERENTIAL/PLATELET
Basophils Absolute: 0 10*3/uL (ref 0.0–0.1)
Basophils Relative: 0 %
Eosinophils Absolute: 0 10*3/uL (ref 0.0–0.7)
Eosinophils Relative: 0 %
HCT: 32.1 % — ABNORMAL LOW (ref 36.0–46.0)
Hemoglobin: 10.4 g/dL — ABNORMAL LOW (ref 12.0–15.0)
Lymphocytes Relative: 9 %
Lymphs Abs: 0.6 10*3/uL — ABNORMAL LOW (ref 0.7–4.0)
MCH: 28.9 pg (ref 26.0–34.0)
MCHC: 32.4 g/dL (ref 30.0–36.0)
MCV: 89.2 fL (ref 78.0–100.0)
Monocytes Absolute: 0.6 10*3/uL (ref 0.1–1.0)
Monocytes Relative: 9 %
Neutro Abs: 5.6 10*3/uL (ref 1.7–7.7)
Neutrophils Relative %: 82 %
Platelets: 162 10*3/uL (ref 150–400)
RBC: 3.6 MIL/uL — ABNORMAL LOW (ref 3.87–5.11)
RDW: 17.3 % — ABNORMAL HIGH (ref 11.5–15.5)
WBC: 6.8 10*3/uL (ref 4.0–10.5)

## 2018-05-24 LAB — COMPREHENSIVE METABOLIC PANEL
ALT: 8 U/L (ref 0–44)
AST: 16 U/L (ref 15–41)
Albumin: 3.7 g/dL (ref 3.5–5.0)
Alkaline Phosphatase: 76 U/L (ref 38–126)
Anion gap: 22 — ABNORMAL HIGH (ref 5–15)
BUN: 26 mg/dL — ABNORMAL HIGH (ref 8–23)
CO2: 24 mmol/L (ref 22–32)
Calcium: 8.6 mg/dL — ABNORMAL LOW (ref 8.9–10.3)
Chloride: 95 mmol/L — ABNORMAL LOW (ref 98–111)
Creatinine, Ser: 3.92 mg/dL — ABNORMAL HIGH (ref 0.44–1.00)
GFR calc Af Amer: 13 mL/min — ABNORMAL LOW (ref >60)
GFR calc non Af Amer: 11 mL/min — ABNORMAL LOW (ref >60)
Glucose, Bld: 100 mg/dL — ABNORMAL HIGH (ref 70–99)
Potassium: 3.9 mmol/L (ref 3.5–5.1)
Sodium: 141 mmol/L (ref 135–145)
Total Bilirubin: 0.8 mg/dL (ref 0.3–1.2)
Total Protein: 6.4 g/dL — ABNORMAL LOW (ref 6.5–8.1)

## 2018-05-24 MED ORDER — VANCOMYCIN HCL IN DEXTROSE 1-5 GM/200ML-% IV SOLN
1000.0000 mg | Freq: Once | INTRAVENOUS | Status: AC
  Administered 2018-05-24: 1000 mg via INTRAVENOUS
  Filled 2018-05-24: qty 200

## 2018-05-24 MED ORDER — SODIUM CHLORIDE 0.9 % IV SOLN
INTRAVENOUS | Status: DC | PRN
  Administered 2018-05-24: 500 mL via INTRAVENOUS

## 2018-05-24 NOTE — Discharge Instructions (Addendum)
Continue doxycycline and follow-up with your wound care specialist on Thursday.  If you have worsening redness, fever, chills or any concerns return immediately to the emergency department.

## 2018-05-24 NOTE — ED Notes (Signed)
Pt c/o pain at IV site (while saline locked). IV flushed without difficulty, pt denies pain with saline flush.

## 2018-05-24 NOTE — ED Notes (Signed)
Wound vac placed Thursday for RLQ abd wound, pt noticed increasing redness on Friday and Saturday. Was seen at walk-in clinic and started on Doxycycline on Sunday (yesterday). Pt states redness and pain have increased over last few days. Wound has been present since 04/19/17.

## 2018-05-24 NOTE — ED Triage Notes (Signed)
Pt c/o pain to the lower abdomen where she has a wound vac, called doctor who advised her to take the wound vac out and repack it, pt states she was scared to do that by her self at home

## 2018-05-24 NOTE — ED Provider Notes (Signed)
[facility] EMERGENCY DEPARTMENT Provider Note   CSN: [id number] Arrival date & time: 05/24/18  1817     History   Chief Complaint Chief Complaint  Patient presents with  . Wound Check    HPI Whitney Chavez is a 62 y.o. female.  HPI Patient has small nonhealing abdominal wound being treated with wound VAC.  Noticed swelling, erythema and tenderness around the site of the wound 3 days ago.  Was seen in urgent care yesterday and started on doxycycline.  States she believes the redness has decreased some.  Called into her doctor's office who suggested she change out the wound VAC.  States she was concerned to do this by herself because she thought she saw blood in the wound VAC.  She denies any fever or chills.  Has a history of MRSA skin infections.  Past Medical History:  Diagnosis Date  . 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 Chronic kidney disease   . Hypertension   . MRSA (methicillin resistant staph aureus) culture positive   . Neuropathy   . Obstructive sleep apnea 03/29/2015  . OSA on CPAP   . Vision abnormalities     Patient Active Problem List   Diagnosis Date Noted  . [place] 04/01/2018  . Chronic pain syndrome 11/12/2017  . Neuropathic pain 11/12/2017  . Influenza A 10/24/2017  . COPD with acute exacerbation ([place]) 10/23/2017  . Hypoxia 10/23/2017  . 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  . Open abdominal wall wound 10/23/2017  . Colitis 10/23/2017  . Normocytic anemia 10/23/2017  . COPD exacerbation ([place]) 10/23/2017  . Moderate major depression, single episode ([place]) 07/16/2017  . Encounter for peritoneal dialysis catheter insertion ([place]) 06/18/2017  . Panniculitis 04/28/2017  . Long term current use of anticoagulant 01/17/2016  . Factor V Leiden mutation ([place]) 01/17/2016  . Axillary vein thrombosis ([place]) 08/16/2015  . Obstructive sleep apnea 03/29/2015  . Factor 5 Leiden mutation, heterozygous ([place]) 03/29/2015  . Insomnia 03/29/2015  . Polyneuropathy  03/29/2015  . Restless leg 03/29/2015  . Gait disturbance 03/29/2015  . Benign hypertension with end-stage renal disease ([place]) 03/01/2015  . Airway hyperreactivity 08/19/2013  . Morbid (severe) obesity due to excess calories ([place]) 08/19/2013  . Apnea, sleep 08/19/2013  . Morbid obesity with BMI of 45.0-49.9, adult ([place]) 08/19/2013  . End-stage renal disease on hemodialysis ([place]) 08/18/2013    Past Surgical History:  Procedure Laterality Date  . APPLICATION OF WOUND VAC    . AV FISTULA PLACEMENT Left   . lumbar decompression fusion    . TONSILLECTOMY       OB History   None      Home Medications    Prior to Admission medications   Medication Sig Start Date End Date Taking? Authorizing Provider  acetaminophen (TYLENOL) 325 MG tablet Take 325 mg by mouth daily as needed for headache (pain).     [provider]  albuterol (PROVENTIL HFA;VENTOLIN HFA) 108 (90 Base) MCG/ACT inhaler Inhale 2 puffs into the lungs every 6 (six) hours as needed for wheezing or shortness of breath. 10/26/17   Elwin Mocha, MD  apixaban (ELIQUIS) 5 MG TABS tablet Take 1 tablet (5 mg total) by mouth 2 (two) times daily. 01/12/18   Volanda Napoleon, MD  B Complex-C-Folic Acid (RENAL VITAMIN PO) Take 1 tablet by mouth every evening.    [provider]  budesonide-formoterol (SYMBICORT) 160-4.5 MCG/ACT inhaler Inhale into the lungs. 10/26/17   [provider]  cetirizine (ZYRTEC) 10  MG tablet Take 10 mg by mouth daily.    [provider]  gabapentin (NEURONTIN) 100 MG capsule Take 1 capsule (100 mg total) by mouth 4 (four) times daily. 04/01/18   Sater, Nanine Means, MD  lanthanum (FOSRENOL) 1000 MG chewable tablet Chew 250-500 mg by mouth See admin instructions. Chew 1 tablet (500 mg) by mouth three times daily with meals, chew 1/4 tablet (250 mg) with snacks 12/04/16   [provider]  Melatonin 10 MG TABS Take 10-20 mg by mouth at bedtime as needed (sleep).      [provider]  nortriptyline (PAMELOR) 10 MG capsule Take 20 mg by mouth at bedtime.     [provider]  omeprazole (PRILOSEC) 20 MG capsule Take 20 mg by mouth daily as needed (acid reflux).     [provider]  PRESCRIPTION MEDICATION Inhale into the lungs at bedtime. CPAP with nasal pillow    [provider]  rOPINIRole (REQUIP) 0.5 MG tablet Take 1-2 tablets in the morning po. 04/01/18   Sater, Nanine Means, MD  rOPINIRole (REQUIP) 2 MG tablet Take 1 tablet (2 mg total) by mouth at bedtime. 04/01/18   Sater, Nanine Means, MD  sulfaSALAzine (AZULFIDINE) 500 MG tablet Take 1,000 mg by mouth 2 (two) times daily.  02/15/15   [provider]  traMADol (ULTRAM) 50 MG tablet Take 50 mg by mouth 2 (two) times daily as needed (pain).     [provider]    Family History Family History  Problem Relation Age of Onset  . Diabetes Mother   . Congestive Heart Failure Mother   . COPD Mother   . Stroke Mother   . Neuropathy Mother   . Heart disease Father   . Stroke Father   . Cancer Brother   . Diabetes Brother   . Diabetes Brother   . Alcohol abuse Brother     Social History Social History   Tobacco Use  . Smoking status: Former Research scientist (life sciences)  . Smokeless tobacco: Never Used  Substance Use Topics  . Alcohol use: No    Alcohol/week: 0.0 standard drinks  . Drug use: No     Allergies   Estrogens; Prednisone; Other; and Tape   Review of Systems Review of Systems  Constitutional: Negative for chills and fever.  Gastrointestinal: Positive for abdominal pain. Negative for diarrhea, nausea and vomiting.  Skin: Positive for color change, rash and wound.  All other systems reviewed and are negative.    Physical Exam Updated Vital Signs BP (!) 150/67   Pulse 67   Temp 98.3 F (36.8 C) (Oral)   Resp 20   Ht 5' (1.524 m)   Wt 113.4 kg   SpO2 94%   BMI 48.82 kg/m   Physical Exam  Constitutional: She is oriented to person, place, and  time. She appears well-developed and well-nourished. No distress.  HENT:  Head: Normocephalic and atraumatic.  Mouth/Throat: Oropharynx is clear and moist.  Eyes: Pupils are equal, round, and reactive to light. EOM are normal.  Neck: Normal range of motion. Neck supple.  Cardiovascular: Normal rate and regular rhythm.  Pulmonary/Chest: Effort normal and breath sounds normal.  Abdominal: Soft. Bowel sounds are normal. There is no tenderness. There is no rebound and no guarding.  Patient with small wound VAC in the right lower quadrant.  No definite appreciable blood around the wound.  Patient does have surrounding erythema, warmth and induration extending beyond the boundaries of the wound VAC in  the right lower abdominal wall.  No definite areas of fluctuance.  Musculoskeletal: Normal range of motion. She exhibits no edema or tenderness.  Neurological: She is alert and oriented to person, place, and time.  Skin: Skin is warm and dry. Capillary refill takes less than 2 seconds. No rash noted. She is not diaphoretic. There is erythema.  Psychiatric: She has a normal mood and affect. Her behavior is normal.  Nursing note and vitals reviewed.    ED Treatments / Results  Labs (all labs ordered are listed, but only abnormal results are displayed) Labs Reviewed  CBC WITH DIFFERENTIAL/PLATELET - Abnormal; Notable for the following components:      Result Value   RBC 3.60 (*)    Hemoglobin 10.4 (*)    HCT 32.1 (*)    RDW 17.3 (*)    Lymphs Abs 0.6 (*)    All other components within normal limits  COMPREHENSIVE METABOLIC PANEL - Abnormal; Notable for the following components:   Chloride 95 (*)    Glucose, Bld 100 (*)    BUN 26 (*)    Creatinine, Ser 3.92 (*)    Calcium 8.6 (*)    Total Protein 6.4 (*)    GFR calc non Af Amer 11 (*)    GFR calc Af Amer 13 (*)    Anion gap 22 (*)    All other components within normal limits  AEROBIC CULTURE (SUPERFICIAL SPECIMEN)     EKG None  Radiology No results found.  Procedures Procedures (including critical care time)  Medications Ordered in ED Medications  vancomycin (VANCOCIN) IVPB 1000 mg/200 mL premix (0 mg Intravenous Stopped 05/25/18 0012)     Initial Impression / Assessment and Plan / ED Course  I have reviewed the triage vital signs and the nursing notes.  Pertinent labs & imaging results that were available during my care of the patient were reviewed by me and considered in my medical decision making (see chart for details).     Wound VAC removed.  Small amount of purulent material coming from the wound.  Wound culture taken.  Wound was then packed with iodoform gauze and dressing placed.  CT with evidence of cellulitis but no abscess.  Border of cellulitis marked with skin marker.  Will give dose of IV vancomycin but anticipate discharge home to follow-up with her physician.  Final Clinical Impressions(s) / ED Diagnoses   Final diagnoses:  Wound infection    ED Discharge Orders    None       Julianne Rice, MD 05/28/18 1515

## 2018-05-24 NOTE — ED Notes (Signed)
Pt returned from CT °

## 2018-05-27 LAB — AEROBIC CULTURE W GRAM STAIN (SUPERFICIAL SPECIMEN)

## 2018-05-28 ENCOUNTER — Telehealth: Payer: Self-pay | Admitting: *Deleted

## 2018-05-28 NOTE — Telephone Encounter (Signed)
Post ED Visit - Positive Culture Follow-up  Culture report reviewed by antimicrobial stewardship pharmacist:  []  Elenor Quinones, Pharm.D. []  Heide Guile, Pharm.D., BCPS AQ-ID []  Parks Neptune, Pharm.D., BCPS []  Alycia Rossetti, Pharm.D., BCPS [x]  [place], Pharm.D., BCPS, AAHIVP []  Legrand Como, Pharm.D., BCPS, AAHIVP []  Salome Arnt, PharmD, BCPS []  Johnnette Gourd, PharmD, BCPS []  Hughes Better, PharmD, BCPS []  Leeroy Cha, PharmD  Positive wound culture Treated with Doxycycline, organism sensitive to the same and no further patient follow-up is required at this time.  Harlon Flor [facility] 05/28/2018, 2:14 PM

## 2018-06-02 ENCOUNTER — Encounter: Payer: Self-pay | Admitting: *Deleted

## 2018-06-21 ENCOUNTER — Telehealth: Payer: Self-pay | Admitting: *Deleted

## 2018-06-21 NOTE — Telephone Encounter (Signed)
Patient is c/o nausea and increased bruising. She believes both to be related to the Eliquis 5mg  she is taking. She wants to know if she can be decreased to 5mg  daily dosing.  Spoke to Dr Marin Olp and he states patient can decrease her dosing to 2.5mg  BID. Dosing cannot be daily.   Informed patient of Dr Antonieta Pert recommendation. She wants to check with her kidney MD before making any changes.

## 2018-08-03 ENCOUNTER — Telehealth: Payer: Self-pay | Admitting: *Deleted

## 2018-08-03 ENCOUNTER — Inpatient Hospital Stay: Payer: Medicare Other | Attending: Hematology & Oncology | Admitting: Hematology & Oncology

## 2018-08-03 ENCOUNTER — Inpatient Hospital Stay: Payer: Medicare Other

## 2018-08-03 VITALS — BP 152/66 | HR 70 | Temp 99.2°F | Resp 18 | Wt 250.2 lb

## 2018-08-03 DIAGNOSIS — D6851 Activated protein C resistance: Secondary | ICD-10-CM

## 2018-08-03 DIAGNOSIS — R600 Localized edema: Secondary | ICD-10-CM | POA: Insufficient documentation

## 2018-08-03 DIAGNOSIS — Z79899 Other long term (current) drug therapy: Secondary | ICD-10-CM | POA: Diagnosis not present

## 2018-08-03 DIAGNOSIS — N186 End stage renal disease: Secondary | ICD-10-CM | POA: Insufficient documentation

## 2018-08-03 DIAGNOSIS — Z992 Dependence on renal dialysis: Secondary | ICD-10-CM | POA: Diagnosis not present

## 2018-08-03 DIAGNOSIS — Z7901 Long term (current) use of anticoagulants: Secondary | ICD-10-CM | POA: Insufficient documentation

## 2018-08-03 DIAGNOSIS — Z86718 Personal history of other venous thrombosis and embolism: Secondary | ICD-10-CM | POA: Insufficient documentation

## 2018-08-03 LAB — CBC WITH DIFFERENTIAL (CANCER CENTER ONLY)
Abs Immature Granulocytes: 0.04 10*3/uL (ref 0.00–0.07)
Basophils Absolute: 0 10*3/uL (ref 0.0–0.1)
Basophils Relative: 1 %
Eosinophils Absolute: 0.3 10*3/uL (ref 0.0–0.5)
Eosinophils Relative: 5 %
HCT: 34.6 % — ABNORMAL LOW (ref 36.0–46.0)
Hemoglobin: 10.5 g/dL — ABNORMAL LOW (ref 12.0–15.0)
Immature Granulocytes: 1 %
Lymphocytes Relative: 17 %
Lymphs Abs: 0.9 10*3/uL (ref 0.7–4.0)
MCH: 27.9 pg (ref 26.0–34.0)
MCHC: 30.3 g/dL (ref 30.0–36.0)
MCV: 91.8 fL (ref 80.0–100.0)
Monocytes Absolute: 0.5 10*3/uL (ref 0.1–1.0)
Monocytes Relative: 9 %
Neutro Abs: 3.6 10*3/uL (ref 1.7–7.7)
Neutrophils Relative %: 67 %
Platelet Count: 219 10*3/uL (ref 150–400)
RBC: 3.77 MIL/uL — ABNORMAL LOW (ref 3.87–5.11)
RDW: 17.1 % — ABNORMAL HIGH (ref 11.5–15.5)
WBC Count: 5.3 10*3/uL (ref 4.0–10.5)
nRBC: 0 % (ref 0.0–0.2)

## 2018-08-03 LAB — CMP (CANCER CENTER ONLY)
ALT: 7 U/L (ref 0–44)
AST: 21 U/L (ref 15–41)
Albumin: 3.8 g/dL (ref 3.5–5.0)
Alkaline Phosphatase: 126 U/L (ref 38–126)
Anion gap: 21 — ABNORMAL HIGH (ref 5–15)
BUN: 27 mg/dL — ABNORMAL HIGH (ref 8–23)
CO2: 25 mmol/L (ref 22–32)
Calcium: 9.2 mg/dL (ref 8.9–10.3)
Chloride: 97 mmol/L — ABNORMAL LOW (ref 98–111)
Creatinine: 4.47 mg/dL (ref 0.44–1.00)
GFR, Est AFR Am: 11 mL/min — ABNORMAL LOW (ref >60)
GFR, Estimated: 10 mL/min — ABNORMAL LOW (ref >60)
Glucose, Bld: 90 mg/dL (ref 70–99)
Potassium: 4.7 mmol/L (ref 3.5–5.1)
Sodium: 143 mmol/L (ref 135–145)
Total Bilirubin: 0.6 mg/dL (ref 0.3–1.2)
Total Protein: 7.1 g/dL (ref 6.5–8.1)

## 2018-08-03 NOTE — Telephone Encounter (Signed)
Critical Value Creatinine 4.47 Dr Marin Olp notified. No orders received

## 2018-08-03 NOTE — Progress Notes (Signed)
Hematology and Oncology Follow Up Visit  Whitney Chavez [id number] October 31, 1955 62 y.o. 08/03/2018   Principle Diagnosis:   Recurrent thromboembolic disease  Heterozygous factor V Leiden mutation  Prothrombin II gene mutation  Calciphylaxis  Current Therapy:    Eliquis 5 mg po BID     Interim History:  [name] is back for follow-up.  She actually had surgery for this calciphylaxis.  She had a laparotomy in the right lower quadrant of her abdomen.  I think she had tumors removed.  It was a very lengthy surgical scar but looks like she healed up very nicely.  She had the surgery on September 30.  She is doing well with her dialysis.  She is on Eliquis.  She is having no problems with the Eliquis.  There is been no bleeding.  She is had no fever.  She has had no cough.  There is been no shortness of breath.  She has had marked swelling in the right leg.  This happened before she had her abdominal surgery.  She had Dopplers which she reports as negative for any blood clot.  I am not sure as to why the right leg is so swollen.  I just wonder if there is some type of lymphedema that she is developed.  Overall, I said her performance status is ECOG 1.   Medications:  Current Outpatient Medications:  .  acetaminophen (TYLENOL) 325 MG tablet, Take 325 mg by mouth daily as needed for headache (pain). , Disp: , Rfl:  .  albuterol (PROVENTIL HFA;VENTOLIN HFA) 108 (90 Base) MCG/ACT inhaler, Inhale 2 puffs into the lungs every 6 (six) hours as needed for wheezing or shortness of breath., Disp: 1 Inhaler, Rfl: 2 .  apixaban (ELIQUIS) 5 MG TABS tablet, Take 1 tablet (5 mg total) by mouth 2 (two) times daily., Disp: 60 tablet, Rfl: 12 .  B Complex-C-Folic Acid (RENAL VITAMIN PO), Take 1 tablet by mouth every evening., Disp: , Rfl:  .  budesonide-formoterol (SYMBICORT) 160-4.5 MCG/ACT inhaler, Inhale into the lungs., Disp: , Rfl:  .  cetirizine (ZYRTEC) 10 MG tablet, Take 10 mg by mouth daily.,  Disp: , Rfl:  .  gabapentin (NEURONTIN) 100 MG capsule, Take 1 capsule (100 mg total) by mouth 4 (four) times daily., Disp: 360 capsule, Rfl: 3 .  lanthanum (FOSRENOL) 1000 MG chewable tablet, Chew 250-500 mg by mouth See admin instructions. Chew 1 tablet (500 mg) by mouth three times daily with meals, chew 1/4 tablet (250 mg) with snacks, Disp: , Rfl:  .  Melatonin 10 MG TABS, Take 10-20 mg by mouth at bedtime as needed (sleep). , Disp: , Rfl:  .  nortriptyline (PAMELOR) 10 MG capsule, Take 20 mg by mouth at bedtime. , Disp: , Rfl:  .  omeprazole (PRILOSEC) 20 MG capsule, Take 20 mg by mouth daily as needed (acid reflux). , Disp: , Rfl:  .  PRESCRIPTION MEDICATION, Inhale into the lungs at bedtime. CPAP with nasal pillow, Disp: , Rfl:  .  rOPINIRole (REQUIP) 0.5 MG tablet, Take 1-2 tablets in the morning po., Disp: 180 tablet, Rfl: 3 .  rOPINIRole (REQUIP) 2 MG tablet, Take 1 tablet (2 mg total) by mouth at bedtime., Disp: 90 tablet, Rfl: 3 .  sulfaSALAzine (AZULFIDINE) 500 MG tablet, Take 1,000 mg by mouth 2 (two) times daily. , Disp: , Rfl:  .  traMADol (ULTRAM) 50 MG tablet, Take 50 mg by mouth 2 (two) times daily as needed (pain). , Disp: ,  Rfl:   Allergies:  Allergies  Allergen Reactions  . Estrogens Other (See Comments)    Blood clots  . Prednisone Other (See Comments)    Facial edema   . Other Other (See Comments)    Unable to take birth control pills due to clotting disorder/fim Fungal powder caused rash when on wound vac  . Tape Rash    Please use paper tape    Past Medical History, Surgical history, Social history, and Family History were reviewed and updated.  Review of Systems: Review of Systems  Constitutional: Negative.   HENT: Negative.   Eyes: Negative.   Respiratory: Negative.   Cardiovascular: Negative.   Gastrointestinal: Negative.   Genitourinary: Negative.   Musculoskeletal: Negative.   Skin: Positive for rash.  Neurological: Negative.     Endo/Heme/Allergies: Negative.   Psychiatric/Behavioral: Negative.      Physical Exam:  weight is 250 lb 4 oz (113.5 kg). Her oral temperature is 99.2 F (37.3 C). Her blood pressure is 152/66 (abnormal) and her pulse is 70. Her respiration is 18 and oxygen saturation is 98%.   Wt Readings from Last 3 Encounters:  08/03/18 250 lb 4 oz (113.5 kg)  05/24/18 250 lb (113.4 kg)  05/04/18 250 lb 8 oz (113.6 kg)     Physical Exam  Constitutional: She is oriented to person, place, and time.  HENT:  Head: Normocephalic and atraumatic.  Mouth/Throat: Oropharynx is clear and moist.  Eyes: Pupils are equal, round, and reactive to light. EOM are normal.  Neck: Normal range of motion.  Cardiovascular: Normal rate, regular rhythm and normal heart sounds.  Pulmonary/Chest: Effort normal and breath sounds normal.  Abdominal: Soft. Bowel sounds are normal.  Abdominal exam shows an obese abdomen.  She has an extended laparotomy scar in the right lower quadrant.  This is healing nicely.  She does have some subcutaneous nodules which are consistent with the calciphylaxis.  Musculoskeletal: Normal range of motion. She exhibits no edema, tenderness or deformity.  She has marked swelling of the right lower leg.  There is no erythema.  There is no tenderness to palpation.  She is a negative Homans sign.  Lymphadenopathy:    She has no cervical adenopathy.  Neurological: She is alert and oriented to person, place, and time.  Skin: Skin is warm and dry. No rash noted. No erythema.  Psychiatric: She has a normal mood and affect. Her behavior is normal. Judgment and thought content normal.  Vitals reviewed.   Lab Results  Component Value Date   WBC 5.3 08/03/2018   HGB 10.5 (L) 08/03/2018   HCT 34.6 (L) 08/03/2018   MCV 91.8 08/03/2018   PLT 219 08/03/2018     Chemistry      Component Value Date/Time   NA 141 05/24/2018 2123   NA 144 06/25/2017 1146   K 3.9 05/24/2018 2123   K 4.1 06/25/2017  1146   CL 95 (L) 05/24/2018 2123   CL 103 06/25/2017 1146   CO2 24 05/24/2018 2123   CO2 31 06/25/2017 1146   BUN 26 (H) 05/24/2018 2123   BUN 27 (H) 06/25/2017 1146   CREATININE 3.92 (H) 05/24/2018 2123   CREATININE 4.70 (HH) 05/04/2018 0930   CREATININE 4.7 (HH) 06/25/2017 1146      Component Value Date/Time   CALCIUM 8.6 (L) 05/24/2018 2123   CALCIUM 9.7 06/25/2017 1146   ALKPHOS 76 05/24/2018 2123   ALKPHOS 60 06/25/2017 1146   AST 16 05/24/2018 2123  AST 27 05/04/2018 0930   ALT 8 05/24/2018 2123   ALT 20 05/04/2018 0930   ALT 14 06/25/2017 1146   BILITOT 0.8 05/24/2018 2123   BILITOT 0.6 05/04/2018 0930         Impression and Plan: Ms. Musick is a 62 year old white female. She has history of recurrent thrombosis. She has both factor V Leiden and prothrombin II gene mutation.  She is on Eliquis.  Eliquis is doing very well for her.  She needs lifelong Eliquis.  We will now plan to get her back in about 3-4 months.  Everything from my point of view is doing okay.  Hopefully, the right leg swelling will improve.   Volanda Napoleon, MD 11/19/201911:26 AM

## 2018-11-25 ENCOUNTER — Ambulatory Visit (INDEPENDENT_AMBULATORY_CARE_PROVIDER_SITE_OTHER): Payer: Medicare Other | Admitting: Pulmonary Disease

## 2018-11-25 ENCOUNTER — Other Ambulatory Visit: Payer: Self-pay

## 2018-11-25 ENCOUNTER — Encounter: Payer: Self-pay | Admitting: Pulmonary Disease

## 2018-11-25 VITALS — BP 138/80 | HR 90 | Ht 61.0 in | Wt 250.2 lb

## 2018-11-25 DIAGNOSIS — J449 Chronic obstructive pulmonary disease, unspecified: Secondary | ICD-10-CM | POA: Diagnosis not present

## 2018-11-25 DIAGNOSIS — R0602 Shortness of breath: Secondary | ICD-10-CM

## 2018-11-25 MED ORDER — TIOTROPIUM BROMIDE MONOHYDRATE 2.5 MCG/ACT IN AERS: 2.0000 | INHALATION_SPRAY | Freq: Every day | RESPIRATORY_TRACT | 0 refills | Status: DC

## 2018-11-25 MED ORDER — TIOTROPIUM BROMIDE MONOHYDRATE 2.5 MCG/ACT IN AERS: 2.0000 | INHALATION_SPRAY | Freq: Every day | RESPIRATORY_TRACT | 5 refills | Status: DC

## 2018-11-25 NOTE — Progress Notes (Signed)
   Subjective:    Patient ID: Whitney Chavez, female    DOB: 11-26-1955, 63 y.o.   MRN: [id number]  HPI    Review of Systems  Constitutional: Negative for fever and unexpected weight change.  HENT: Positive for postnasal drip and rhinorrhea. Negative for congestion, dental problem, ear pain, nosebleeds, sinus pressure, sneezing, sore throat and trouble swallowing.   Eyes: Negative for redness and itching.  Respiratory: Positive for cough, chest tightness, shortness of breath and wheezing.   Cardiovascular: Positive for leg swelling. Negative for palpitations.  Gastrointestinal: Negative for nausea and vomiting.  Genitourinary: Negative for dysuria.  Musculoskeletal: Negative for joint swelling.  Skin: Negative for rash.  Allergic/Immunologic: Positive for environmental allergies and immunocompromised state.  Neurological: Positive for headaches.  Hematological: Bruises/bleeds easily.  Psychiatric/Behavioral: Positive for dysphoric mood. The patient is not nervous/anxious.        Objective:   Physical Exam        Assessment & Plan:

## 2018-11-25 NOTE — Progress Notes (Signed)
Subjective:   PATIENT ID: Whitney Chavez GENDER: female DOB: 1956-06-26, MRN: [id number]   HPI  Chief Complaint  Patient presents with  . Consult    hx asthma/COPD - worried that calciphylaxis is causing increased lung issues due to increasing SOB    Reason for Visit: New consult for SOB  [name] is a 63 year old female former smoker with recurrent thromboembolic disease secondary to Factor V Leiden mutation on anticoagulation, ESRD on hemodialysis and calciphylaxis on sodium thiosulfate.  Reviewed PCP records on 11/02/2018: Presented to PCP for malaise, dry cough and headache.  Compliant with her Symbicort and albuterol she was treated for bronchitis with prednisone and Z-Pak  In the last year, she reports worsening shortness of breath with exertion. Associated with occassional wheezing and productive cough. Compliant with Symbicort. She was increased to 160 last month. Albuterol as needed which seems to help her symptoms. She had a recent cold earlier this year and responded well to nebulizers and prednisone burst.  She has lymphedema which limits her activity. She uses a Rollator at home but the walker is too heavy for regular use. She lives alone on one floor. She is not able to do stairs.   Goals of care She is concerned about her declining health status. She has a DNR in place and would not want intubation or chest compressions.  Social History: 60 pack year history. Quit 20 years ago.  Environmental exposures:  None  I have personally reviewed patient's past medical/family/social history, allergies, current medications.  Past Medical History:  Diagnosis Date  . Asthma   . Axillary vein thrombosis ([place]) 08/16/2015  . Calciphylaxis   . Chronic kidney disease   . Colitis   . COPD (chronic obstructive pulmonary disease) ([place])   . Hypertension   . Lymphedema    legs  . MRSA (methicillin resistant staph aureus) culture positive   . Neuropathy   . Obstructive  sleep apnea 03/29/2015  . OSA on CPAP   . Vision abnormalities      Family History  Problem Relation Age of Onset  . Diabetes Mother   . Congestive Heart Failure Mother   . COPD Mother   . Stroke Mother   . Neuropathy Mother   . Heart disease Father   . Stroke Father   . Cancer Brother   . Diabetes Brother   . Diabetes Brother   . Alcohol abuse Brother      Social History   Occupational History  . Not on file  Tobacco Use  . Smoking status: Former Smoker    Packs/day: 2.00    Years: 30.00    Pack years: 60.00    Last attempt to quit: 11/25/1998    Years since quitting: 20.0  . Smokeless tobacco: Never Used  Substance and Sexual Activity  . Alcohol use: No    Alcohol/week: 0.0 standard drinks  . Drug use: No  . Sexual activity: Not on file    Allergies  Allergen Reactions  . Estrogens Other (See Comments)    Blood clots  . Prednisone Other (See Comments)    Facial edema - has had this 2020 and had no difficulty Facial edema  "can take in small doses"  . Progesterone Other (See Comments)    Blood clots  . Other Other (See Comments)    Unable to take birth control pills due to clotting disorder/fim Fungal powder caused rash when on wound vac  . Tape Rash  Please use paper tape     Outpatient Medications Prior to Visit  Medication Sig Dispense Refill  . acetaminophen (TYLENOL) 325 MG tablet Take 325 mg by mouth daily as needed for headache (pain).     Marland Kitchen albuterol (PROVENTIL HFA;VENTOLIN HFA) 108 (90 Base) MCG/ACT inhaler Inhale 2 puffs into the lungs every 6 (six) hours as needed for wheezing or shortness of breath. 1 Inhaler 2  . apixaban (ELIQUIS) 5 MG TABS tablet Take 1 tablet (5 mg total) by mouth 2 (two) times daily. 60 tablet 12  . budesonide-formoterol (SYMBICORT) 160-4.5 MCG/ACT inhaler Inhale into the lungs.    . cetirizine (ZYRTEC) 10 MG tablet Take 10 mg by mouth daily.    . Etelcalcetide HCl (PARSABIV IV) Inject into the vein.    Marland Kitchen gabapentin  (NEURONTIN) 100 MG capsule Take 1 capsule (100 mg total) by mouth 4 (four) times daily. 360 capsule 3  . lanthanum (FOSRENOL) 1000 MG chewable tablet Chew 250-500 mg by mouth See admin instructions. Chew 1 tablet (500 mg) by mouth three times daily with meals, chew 1/4 tablet (250 mg) with snacks    . Melatonin 10 MG TABS Take 10-20 mg by mouth at bedtime as needed (sleep).     . nortriptyline (PAMELOR) 10 MG capsule Take 20 mg by mouth at bedtime.     Marland Kitchen omeprazole (PRILOSEC) 20 MG capsule Take 20 mg by mouth daily as needed (acid reflux). Rarely takes    . PRESCRIPTION MEDICATION Inhale into the lungs at bedtime. CPAP with nasal pillow    . rOPINIRole (REQUIP) 0.5 MG tablet Take 1-2 tablets in the morning po. 180 tablet 3  . rOPINIRole (REQUIP) 2 MG tablet Take 1 tablet (2 mg total) by mouth at bedtime. 90 tablet 3  . sulfaSALAzine (AZULFIDINE) 500 MG tablet Take 1,000 mg by mouth 2 (two) times daily.     . traMADol (ULTRAM) 50 MG tablet Take 50 mg by mouth 2 (two) times daily as needed (pain).     . B Complex-C-Folic Acid (RENAL VITAMIN PO) Take 1 tablet by mouth every evening.    Marland Kitchen SODIUM THIOSULFATE IV Inject into the vein.     No facility-administered medications prior to visit.     Review of Systems  Constitutional: Negative for chills, diaphoresis, fever, malaise/fatigue and weight loss.  HENT: Negative for congestion, ear pain and sore throat.   Respiratory: Positive for cough, sputum production, shortness of breath and wheezing. Negative for hemoptysis.   Cardiovascular: Negative for chest pain, palpitations and leg swelling.  Gastrointestinal: Negative for abdominal pain, heartburn and nausea.  Genitourinary: Negative for frequency.  Musculoskeletal: Negative for joint pain and myalgias.  Skin: Negative for itching and rash.  Neurological: Negative for dizziness, weakness and headaches.  Endo/Heme/Allergies: Does not bruise/bleed easily.  Psychiatric/Behavioral: Negative for  depression. The patient is not nervous/anxious.      Objective:   Vitals:   11/25/18 1502  BP: 138/80  Pulse: 90  SpO2: 94%  Weight: 250 lb 3.2 oz (113.5 kg)  Height: 5\' 1"  (1.549 m)   SpO2: 94 %  Physical Exam: General: Well-appearing, no acute distress HENT: [place], AT, OP clear, MMM Eyes: EOMI, no scleral icterus Respiratory: Clear to auscultation bilaterally.  No crackles, wheezing or rales Cardiovascular: RRR, -M/R/G, no JVD GI: BS+, soft, nontender Extremities: Chronic lymphedema,-tenderness Neuro: AAO x4, CNII-XII grossly intact Skin: Intact, no rashes or bruising Psych: Depressed mood, normal affect  Data Reviewed:  Imaging: CXR 10/23/17 - mild cardiomegaly.  No infiltrate, effusion or edema  PFT: 12/29/17 FVC 1.73 (59%) FEV1 1.30 (58%) Ratio 75  Interpretation: Moderately severe obstructive defect. Positive for hyperresponsiveness  Labs: CMP and CBC 08/03/18. Mildly elevated BUN. Cr elevation and anemia in known ESRD  Imaging, labs and tests noted above have been reviewed independently by me.    Assessment & Plan:   Discussion: 63 year old female with probable COPD/asthma overlap. PFTs also demonstrate reduced FVC with possible co-comitant restrictive lung defect. Will need full PFTs to evaluate. Will optimize bronchodilator therapy and obtain echocardiogram for dyspnea.  Shortness of breath -Continue Symbicort 180-4.5 mcg 2 puffs twice a day -START Spiriva Respimat 2.5 mcg 2 puffs once a day -Full Pulmonary function test at next visit -TTE -Keep follow-up at [facility] in lymphedema clinic  Goals of care She is concerned about her declining health status. She has a DNR in place and would not want intubation or chest compressions.  Health Maintenance Pneumonia - Prevnar 11/27/14, Pneumovax 07/11/16 Influenza 06/25/16 CT Lung Screen - Discuss referral at next visit  Orders Placed This Encounter  Procedures  . ECHOCARDIOGRAM COMPLETE    Standing Status:    Future    Number of Occurrences:   1    Standing Expiration Date:   02/25/2020    Scheduling Instructions:     As soon as available    Order Specific Question:   Where should this test be performed    Answer:   [facility]    Order Specific Question:   Perflutren DEFINITY (image enhancing agent) should be administered unless hypersensitivity or allergy exist    Answer:   Administer Perflutren    Order Specific Question:   Reason for exam-Echo    Answer:   Other-Full Diagnosis List    Order Specific Question:   Full ICD-10/Reason for Exam    Answer:   Shortness of breath [786.05.ICD-9-CM]    Order Specific Question:   Other Comments    Answer:   shortness of breath  . Pulmonary Function Test    Standing Status:   Future    Standing Expiration Date:   11/25/2019    Scheduling Instructions:     3 months with OV    Order Specific Question:   Where should this test be performed?    Answer:   [facility]    Order Specific Question:   Full PFT: includes the following: basic spirometry, spirometry pre & post bronchodilator, diffusion capacity (DLCO), lung volumes    Answer:   Full PFT    Order Specific Question:   MIP/MEP    Answer:   No    Order Specific Question:   6 minute walk    Answer:   No    Order Specific Question:   ABG    Answer:   No    Order Specific Question:   Diffusion capacity (DLCO)    Answer:   Yes    Order Specific Question:   Lung volumes    Answer:   Yes    Order Specific Question:   Methacholine challenge    Answer:   No   Meds ordered this encounter  Medications  . DISCONTD: Tiotropium Bromide Monohydrate (SPIRIVA RESPIMAT) 2.5 MCG/ACT AERS    Sig: Inhale 2 puffs into the lungs daily for 1 day.    Dispense:  1 Inhaler    Refill:  5  . DISCONTD: Tiotropium Bromide Monohydrate (SPIRIVA RESPIMAT) 2.5 MCG/ACT AERS    Sig: Inhale 2 puffs  into the lungs daily for 1 day.    Dispense:  1 Inhaler    Refill:  0    Order Specific Question:   Lot Number?     Answer:   [id number]    Order Specific Question:   Expiration Date?    Answer:   11/24/2020    Order Specific Question:   Quantity    Answer:   3    No follow-ups on file.  [place], MD [facility] 12/08/2018 1:12 PM  Office Number [phone]

## 2018-11-25 NOTE — Patient Instructions (Addendum)
Shortness of breath Likely multifactorial including COPD/asthma overlap -Continue Symbicort 180-4.5 mcg 2 puffs twice a day -START Spiriva Respimat 2.5 mcg 2 puffs once a day -Full Pulmonary function test at next visit -TTE -Keep follow-up at [facility] in [place] clinic

## 2018-11-30 ENCOUNTER — Encounter: Payer: Self-pay | Admitting: Hematology & Oncology

## 2018-11-30 ENCOUNTER — Other Ambulatory Visit: Payer: Self-pay

## 2018-11-30 ENCOUNTER — Inpatient Hospital Stay: Payer: Medicare Other | Attending: Hematology & Oncology | Admitting: Hematology & Oncology

## 2018-11-30 ENCOUNTER — Telehealth: Payer: Self-pay | Admitting: *Deleted

## 2018-11-30 ENCOUNTER — Inpatient Hospital Stay: Payer: Medicare Other

## 2018-11-30 VITALS — BP 169/83 | HR 92 | Temp 98.7°F | Resp 19 | Wt 247.0 lb

## 2018-11-30 DIAGNOSIS — Z992 Dependence on renal dialysis: Secondary | ICD-10-CM

## 2018-11-30 DIAGNOSIS — D6851 Activated protein C resistance: Secondary | ICD-10-CM

## 2018-11-30 DIAGNOSIS — Z7901 Long term (current) use of anticoagulants: Secondary | ICD-10-CM

## 2018-11-30 DIAGNOSIS — Z79899 Other long term (current) drug therapy: Secondary | ICD-10-CM | POA: Diagnosis not present

## 2018-11-30 DIAGNOSIS — N186 End stage renal disease: Secondary | ICD-10-CM | POA: Diagnosis not present

## 2018-11-30 DIAGNOSIS — I82A19 Acute embolism and thrombosis of unspecified axillary vein: Secondary | ICD-10-CM

## 2018-11-30 DIAGNOSIS — Z86718 Personal history of other venous thrombosis and embolism: Secondary | ICD-10-CM | POA: Diagnosis not present

## 2018-11-30 LAB — CBC WITH DIFFERENTIAL (CANCER CENTER ONLY)
Abs Immature Granulocytes: 0.02 10*3/uL (ref 0.00–0.07)
Basophils Absolute: 0.1 10*3/uL (ref 0.0–0.1)
Basophils Relative: 1 %
Eosinophils Absolute: 0.2 10*3/uL (ref 0.0–0.5)
Eosinophils Relative: 4 %
HCT: 31.1 % — ABNORMAL LOW (ref 36.0–46.0)
Hemoglobin: 9.6 g/dL — ABNORMAL LOW (ref 12.0–15.0)
Immature Granulocytes: 0 %
Lymphocytes Relative: 16 %
Lymphs Abs: 0.8 10*3/uL (ref 0.7–4.0)
MCH: 30.6 pg (ref 26.0–34.0)
MCHC: 30.9 g/dL (ref 30.0–36.0)
MCV: 99 fL (ref 80.0–100.0)
Monocytes Absolute: 0.4 10*3/uL (ref 0.1–1.0)
Monocytes Relative: 9 %
Neutro Abs: 3.3 10*3/uL (ref 1.7–7.7)
Neutrophils Relative %: 70 %
Platelet Count: 166 10*3/uL (ref 150–400)
RBC: 3.14 MIL/uL — ABNORMAL LOW (ref 3.87–5.11)
RDW: 18 % — ABNORMAL HIGH (ref 11.5–15.5)
WBC Count: 4.7 10*3/uL (ref 4.0–10.5)
nRBC: 0 % (ref 0.0–0.2)

## 2018-11-30 LAB — CMP (CANCER CENTER ONLY)
ALT: 9 U/L (ref 0–44)
AST: 23 U/L (ref 15–41)
Albumin: 4.7 g/dL (ref 3.5–5.0)
Alkaline Phosphatase: 101 U/L (ref 38–126)
Anion gap: 14 (ref 5–15)
BUN: 22 mg/dL (ref 8–23)
CO2: 30 mmol/L (ref 22–32)
Calcium: 8.5 mg/dL — ABNORMAL LOW (ref 8.9–10.3)
Chloride: 96 mmol/L — ABNORMAL LOW (ref 98–111)
Creatinine: 4.03 mg/dL (ref 0.44–1.00)
GFR, Est AFR Am: 13 mL/min — ABNORMAL LOW (ref >60)
GFR, Estimated: 11 mL/min — ABNORMAL LOW (ref >60)
Glucose, Bld: 144 mg/dL — ABNORMAL HIGH (ref 70–99)
Potassium: 3.9 mmol/L (ref 3.5–5.1)
Sodium: 140 mmol/L (ref 135–145)
Total Bilirubin: 0.8 mg/dL (ref 0.3–1.2)
Total Protein: 6.9 g/dL (ref 6.5–8.1)

## 2018-11-30 LAB — FERRITIN: Ferritin: 1006 ng/mL — ABNORMAL HIGH (ref 11–307)

## 2018-11-30 LAB — IRON AND TIBC
Iron: 50 ug/dL (ref 41–142)
Saturation Ratios: 25 % (ref 21–57)
TIBC: 201 ug/dL — ABNORMAL LOW (ref 236–444)
UIBC: 152 ug/dL (ref 120–384)

## 2018-11-30 NOTE — Telephone Encounter (Signed)
Richardson Landry from lab gave me a critical lab value of Creatinine 4.03. Results given to MD.

## 2018-11-30 NOTE — Progress Notes (Signed)
Hematology and Oncology Follow Up Visit  Whitney Chavez [id number] Aug 25, 1956 63 y.o. 11/30/2018   Principle Diagnosis:   Recurrent thromboembolic disease  Heterozygous factor V Leiden mutation  Prothrombin II gene mutation  Calciphylaxis  Current Therapy:    Eliquis 5 mg po BID     Interim History:  [name] is back for follow-up.  She is developed lymphedema of the right leg.  Even with dialysis, this is not helping all that much.  I think that she is actually going an extra day to dialysis to try to get the extra fluid taken off that right leg.  I am sure that the lymphedema is because of the surgeries that she has had in the right inguinal area.  Otherwise, she is doing okay.  She does have the calciphylaxis.  She is on medications for this.  She has had no problems with the Eliquis.  Her skin is becoming quite thin and that whenever she hits something, the skin does tend to bruise.  She has had no issues with fever.  She has had no cough.  She has seen a pulmonologist for some shortness of breath.  She also has seen a cardiologist I think had an echocardiogram done.  Overall, I said her performance status is ECOG 1.   Medications:  Current Outpatient Medications:    Darbepoetin Alfa-Albumin (ARANESP IJ), Inject as directed once a week. At dialysis center, dose varies., Disp: , Rfl:    docusate sodium (COLACE) 100 MG capsule, Take 100 mg by mouth 2 (two) times daily., Disp: , Rfl:    Tiotropium Bromide Monohydrate (SPIRIVA RESPIMAT) 2.5 MCG/ACT AERS, Inhale 2.5 mcg into the lungs daily. 2 puffs once daily, Disp: , Rfl:    acetaminophen (TYLENOL) 325 MG tablet, Take 325 mg by mouth daily as needed for headache (pain). , Disp: , Rfl:    albuterol (PROVENTIL HFA;VENTOLIN HFA) 108 (90 Base) MCG/ACT inhaler, Inhale 2 puffs into the lungs every 6 (six) hours as needed for wheezing or shortness of breath., Disp: 1 Inhaler, Rfl: 2   apixaban (ELIQUIS) 5 MG TABS tablet,  Take 1 tablet (5 mg total) by mouth 2 (two) times daily., Disp: 60 tablet, Rfl: 12   B Complex-C-Folic Acid (RENAL VITAMIN PO), Take 1 tablet by mouth daily., Disp: , Rfl:    budesonide-formoterol (SYMBICORT) 160-4.5 MCG/ACT inhaler, Inhale into the lungs., Disp: , Rfl:    cetirizine (ZYRTEC) 10 MG tablet, Take 10 mg by mouth daily., Disp: , Rfl:    Etelcalcetide HCl (PARSABIV IV), Inject into the vein., Disp: , Rfl:    gabapentin (NEURONTIN) 100 MG capsule, Take 1 capsule (100 mg total) by mouth 4 (four) times daily., Disp: 360 capsule, Rfl: 3   lanthanum (FOSRENOL) 1000 MG chewable tablet, Chew 250-500 mg by mouth See admin instructions. Chew 1 tablet (500 mg) by mouth three times daily with meals, chew 1/4 tablet (250 mg) with snacks, Disp: , Rfl:    Melatonin 10 MG TABS, Take 10-20 mg by mouth at bedtime as needed (sleep). , Disp: , Rfl:    nortriptyline (PAMELOR) 10 MG capsule, Take 20 mg by mouth at bedtime. , Disp: , Rfl:    omeprazole (PRILOSEC) 20 MG capsule, Take 20 mg by mouth daily as needed (acid reflux). Rarely takes, Disp: , Rfl:    ondansetron (ZOFRAN-ODT) 4 MG disintegrating tablet, Take 4 mg by mouth every 3 (three) days., Disp: , Rfl:    PRESCRIPTION MEDICATION, Inhale into the lungs at  bedtime. CPAP with nasal pillow, Disp: , Rfl:    rOPINIRole (REQUIP) 0.5 MG tablet, Take 1-2 tablets in the morning po., Disp: 180 tablet, Rfl: 3   rOPINIRole (REQUIP) 2 MG tablet, Take 1 tablet (2 mg total) by mouth at bedtime., Disp: 90 tablet, Rfl: 3   sulfaSALAzine (AZULFIDINE) 500 MG tablet, Take 1,000 mg by mouth 2 (two) times daily. , Disp: , Rfl:    traMADol (ULTRAM) 50 MG tablet, Take 50 mg by mouth 2 (two) times daily as needed (pain). , Disp: , Rfl:    triamcinolone cream (KENALOG) 0.1 %, APPLY CREAM EXTERNALLY ONCE DAILY AS NEEDED FOR 30 DAYS, Disp: , Rfl:   Allergies:  Allergies  Allergen Reactions   Estrogens Other (See Comments)    Blood clots   Prednisone  Other (See Comments)    Facial edema - has had this 2020 and had no difficulty Facial edema  "can take in small doses"   Progesterone Other (See Comments)    Blood clots   Other Other (See Comments)    Unable to take birth control pills due to clotting disorder/fim Fungal powder caused rash when on wound vac   Tape Rash    Please use paper tape    Past Medical History, Surgical history, Social history, and Family History were reviewed and updated.  Review of Systems: Review of Systems  Constitutional: Negative.   HENT: Negative.   Eyes: Negative.   Respiratory: Negative.   Cardiovascular: Negative.   Gastrointestinal: Negative.   Genitourinary: Negative.   Musculoskeletal: Negative.   Skin: Positive for rash.  Neurological: Negative.   Endo/Heme/Allergies: Negative.   Psychiatric/Behavioral: Negative.      Physical Exam:  weight is 247 lb (112 kg). Her oral temperature is 98.7 F (37.1 C). Her blood pressure is 169/83 (abnormal) and her pulse is 92. Her respiration is 19 and oxygen saturation is 97%.   Wt Readings from Last 3 Encounters:  11/30/18 247 lb (112 kg)  11/25/18 250 lb 3.2 oz (113.5 kg)  08/03/18 250 lb 4 oz (113.5 kg)     Physical Exam Vitals signs reviewed.  HENT:     Head: Normocephalic and atraumatic.  Eyes:     Pupils: Pupils are equal, round, and reactive to light.  Neck:     Musculoskeletal: Normal range of motion.  Cardiovascular:     Rate and Rhythm: Normal rate and regular rhythm.     Heart sounds: Normal heart sounds.  Pulmonary:     Effort: Pulmonary effort is normal.     Breath sounds: Normal breath sounds.  Abdominal:     General: Bowel sounds are normal.     Palpations: Abdomen is soft.     Comments: Abdominal exam shows an obese abdomen.  She has an extended laparotomy scar in the right lower quadrant.  This is healing nicely.  She does have some subcutaneous nodules which are consistent with the calciphylaxis.    Musculoskeletal: Normal range of motion.        General: No tenderness or deformity.     Comments: She has marked swelling of the right lower leg.  There is no erythema.  There is no tenderness to palpation.  She is a negative Homans sign.  Lymphadenopathy:     Cervical: No cervical adenopathy.  Skin:    General: Skin is warm and dry.     Findings: No erythema or rash.  Neurological:     Mental Status: She is alert and oriented  to person, place, and time.  Psychiatric:        Behavior: Behavior normal.        Thought Content: Thought content normal.        Judgment: Judgment normal.     Lab Results  Component Value Date   WBC 4.7 11/30/2018   HGB 9.6 (L) 11/30/2018   HCT 31.1 (L) 11/30/2018   MCV 99.0 11/30/2018   PLT 166 11/30/2018     Chemistry      Component Value Date/Time   NA 140 11/30/2018 0921   NA 144 06/25/2017 1146   K 3.9 11/30/2018 0921   K 4.1 06/25/2017 1146   CL 96 (L) 11/30/2018 0921   CL 103 06/25/2017 1146   CO2 30 11/30/2018 0921   CO2 31 06/25/2017 1146   BUN 22 11/30/2018 0921   BUN 27 (H) 06/25/2017 1146   CREATININE 4.03 (HH) 11/30/2018 0921   CREATININE 4.7 (HH) 06/25/2017 1146      Component Value Date/Time   CALCIUM 8.5 (L) 11/30/2018 0921   CALCIUM 9.7 06/25/2017 1146   ALKPHOS 101 11/30/2018 0921   ALKPHOS 60 06/25/2017 1146   AST 23 11/30/2018 0921   ALT 9 11/30/2018 0921   ALT 14 06/25/2017 1146   BILITOT 0.8 11/30/2018 0921         Impression and Plan: Ms. Christon is a 63 year old white female. She has history of recurrent thrombosis. She has both factor V Leiden and prothrombin II gene mutation.  She is on Eliquis.  Eliquis is doing very well for her.  She needs lifelong Eliquis.  We will now plan to get her back in about 3-4 months.  Everything from my point of view is doing okay.  I am not sure if the lymphedema will ever improve.  I know that they are trying hard for her.   Volanda Napoleon, MD 3/17/202010:40 AM

## 2018-12-02 ENCOUNTER — Other Ambulatory Visit: Payer: Self-pay

## 2018-12-02 ENCOUNTER — Ambulatory Visit (HOSPITAL_COMMUNITY): Payer: Medicare Other | Attending: Cardiology

## 2018-12-02 DIAGNOSIS — R0602 Shortness of breath: Secondary | ICD-10-CM | POA: Insufficient documentation

## 2018-12-17 ENCOUNTER — Other Ambulatory Visit: Payer: Self-pay

## 2018-12-17 ENCOUNTER — Telehealth: Payer: Self-pay

## 2018-12-17 MED ORDER — TIOTROPIUM BROMIDE MONOHYDRATE 2.5 MCG/ACT IN AERS: 2.0000 | INHALATION_SPRAY | Freq: Every day | RESPIRATORY_TRACT | 3 refills | Status: DC

## 2018-12-17 NOTE — Telephone Encounter (Signed)
Patient faxed a patient assistance application form and a personal letter asking that we complete the provider portion to help her get med assistance for Spiriva.  This is a patient of Dr. Cordelia Pen but she is currently working covid10 relief and is not in the office.  Will submit request under Lazaro Arms, NP in order to get application faxed.  Application faxed today to [facility] [phone]. Spoke with patient by phone to reschedule an appointment and agreed to follow up with med assistance.  Order printed for spiriva.  Nothing further needed.

## 2018-12-21 NOTE — Telephone Encounter (Signed)
Fax received from [employer] confirming they received the patient application for assistance with Spiriva Respimat. But they cannot process it because the application was missing proof of income and it is missing completed application pages 2 and 3.  The fax also stated the missing information will need to be sent to:  [facility] Patient Assistance [place] [address] [address] Fax # [phone]  If any questions they can be reached at [phone] Monday - Friday 8:30 am to 6:00 p.m.  Information above routed to [facility] as [place].

## 2018-12-21 NOTE — Telephone Encounter (Signed)
Attempted to call patient re:BI application needs.  Someone picked up the phone but then hung up.  Redialed her but line busy.  Need to let patient know the patient portion has not been sent in and needs to be completed.  BI application was received by fax from dialysis, as they assisted patient with the application while she was there.  We did not receive full patient application but only the provider portion which was completed and faxed. Was unsure if other portions had been sent to [facility] from the dialysis unit.  Need to have patient complete her portion. Need to confirm she has not completed it and if she needs it sent to her to complete application.

## 2018-12-22 NOTE — Telephone Encounter (Signed)
Contacted patient by phone.  She states she did complete the patient portion of the BI medication assistance form for Spiriva and again confirmed she had dialysis fax the provider portion to our office but she couldn't remember if sheapplication did not arrive with the faxed info to our office. Patient states she believes she still has the patient portion of the application and plans to get dialysis to fax them to [facility] when she returns to dialysis on Friday. She thinks she only asked them to send our provider forms to [place] and forgot to submit her portion of the application.  Asked patient to let us know if she has difficulty locating these or getting them faxed in, as this will delay her application being processed.  Patient states she will try to remember to update [place] when she sends the forms in.  Patient acknowledged understanding and plans to complete and send this by Friday 12/24/18.  Nothing further needed at this time.

## 2018-12-28 NOTE — Telephone Encounter (Signed)
Received two faxes from [employer] dated 12/24/18. One letter stating the patient has been denied assistance due to being over income. And the second letter saying she was approved from  4/10/20until 12/24/2019.  [employer] # [id number]. Will call to get confirmation of assistance status.

## 2018-12-28 NOTE — Telephone Encounter (Signed)
[place] and confirmed the patient did obtain approval from 12/24/19 until 12/24/19 and a shipment of the Spiriva was being sent out today.  Nothing further needed at this time.

## 2019-03-01 ENCOUNTER — Ambulatory Visit: Payer: Medicare Other | Admitting: Pulmonary Disease

## 2019-03-03 ENCOUNTER — Ambulatory Visit: Payer: Medicare Other | Admitting: Primary Care

## 2019-03-03 ENCOUNTER — Ambulatory Visit: Payer: Medicare Other | Admitting: Pulmonary Disease

## 2019-03-16 ENCOUNTER — Other Ambulatory Visit: Payer: Self-pay | Admitting: Pulmonary Disease

## 2019-03-17 ENCOUNTER — Other Ambulatory Visit (HOSPITAL_COMMUNITY): Payer: Medicare Other

## 2019-03-22 ENCOUNTER — Other Ambulatory Visit (HOSPITAL_COMMUNITY)
Admission: RE | Admit: 2019-03-22 | Discharge: 2019-03-22 | Disposition: A | Discharge status: Home / Self Care | Payer: Medicare Other | Source: Ambulatory Visit | Attending: Pulmonary Disease | Admitting: Pulmonary Disease

## 2019-03-22 DIAGNOSIS — Z01812 Encounter for preprocedural laboratory examination: Secondary | ICD-10-CM | POA: Diagnosis present

## 2019-03-22 DIAGNOSIS — Z1159 Encounter for screening for other viral diseases: Secondary | ICD-10-CM | POA: Diagnosis not present

## 2019-03-22 LAB — SARS CORONAVIRUS 2 (TAT 6-24 HRS): SARS Coronavirus 2: NEGATIVE

## 2019-03-24 ENCOUNTER — Ambulatory Visit: Payer: Medicare Other | Admitting: Primary Care

## 2019-03-24 ENCOUNTER — Other Ambulatory Visit: Payer: Self-pay

## 2019-03-28 ENCOUNTER — Telehealth: Payer: Self-pay

## 2019-03-28 NOTE — Telephone Encounter (Signed)
LMTCB

## 2019-03-28 NOTE — Telephone Encounter (Signed)
-----   Message from [place], MD sent at 03/28/2019 11:36 AM EDT ----- Please contact patient and let her know result is negative for COVID

## 2019-03-29 ENCOUNTER — Other Ambulatory Visit: Payer: Self-pay

## 2019-03-29 ENCOUNTER — Inpatient Hospital Stay: Payer: Medicare Other

## 2019-03-29 ENCOUNTER — Encounter: Payer: Self-pay | Admitting: Family

## 2019-03-29 ENCOUNTER — Inpatient Hospital Stay: Payer: Medicare Other | Attending: Hematology & Oncology | Admitting: Family

## 2019-03-29 VITALS — BP 141/62 | HR 62 | Temp 98.4°F | Resp 19 | Ht 60.0 in | Wt 233.0 lb

## 2019-03-29 DIAGNOSIS — Z79899 Other long term (current) drug therapy: Secondary | ICD-10-CM | POA: Insufficient documentation

## 2019-03-29 DIAGNOSIS — D6851 Activated protein C resistance: Secondary | ICD-10-CM | POA: Insufficient documentation

## 2019-03-29 DIAGNOSIS — Z7901 Long term (current) use of anticoagulants: Secondary | ICD-10-CM | POA: Insufficient documentation

## 2019-03-29 DIAGNOSIS — D509 Iron deficiency anemia, unspecified: Secondary | ICD-10-CM

## 2019-03-29 DIAGNOSIS — I82A19 Acute embolism and thrombosis of unspecified axillary vein: Secondary | ICD-10-CM

## 2019-03-29 LAB — CBC WITH DIFFERENTIAL (CANCER CENTER ONLY)
Abs Immature Granulocytes: 0.01 10*3/uL (ref 0.00–0.07)
Basophils Absolute: 0.1 10*3/uL (ref 0.0–0.1)
Basophils Relative: 1 %
Eosinophils Absolute: 0.2 10*3/uL (ref 0.0–0.5)
Eosinophils Relative: 4 %
HCT: 32.6 % — ABNORMAL LOW (ref 36.0–46.0)
Hemoglobin: 10.4 g/dL — ABNORMAL LOW (ref 12.0–15.0)
Immature Granulocytes: 0 %
Lymphocytes Relative: 15 %
Lymphs Abs: 0.7 10*3/uL (ref 0.7–4.0)
MCH: 31.5 pg (ref 26.0–34.0)
MCHC: 31.9 g/dL (ref 30.0–36.0)
MCV: 98.8 fL (ref 80.0–100.0)
Monocytes Absolute: 0.6 10*3/uL (ref 0.1–1.0)
Monocytes Relative: 12 %
Neutro Abs: 3.2 10*3/uL (ref 1.7–7.7)
Neutrophils Relative %: 68 %
Platelet Count: 118 10*3/uL — ABNORMAL LOW (ref 150–400)
RBC: 3.3 MIL/uL — ABNORMAL LOW (ref 3.87–5.11)
RDW: 17 % — ABNORMAL HIGH (ref 11.5–15.5)
WBC Count: 4.7 10*3/uL (ref 4.0–10.5)
nRBC: 0 % (ref 0.0–0.2)

## 2019-03-29 LAB — CMP (CANCER CENTER ONLY)
ALT: 8 U/L (ref 0–44)
AST: 19 U/L (ref 15–41)
Albumin: 4.6 g/dL (ref 3.5–5.0)
Alkaline Phosphatase: 73 U/L (ref 38–126)
Anion gap: 20 — ABNORMAL HIGH (ref 5–15)
BUN: 44 mg/dL — ABNORMAL HIGH (ref 8–23)
CO2: 27 mmol/L (ref 22–32)
Calcium: 7.7 mg/dL — ABNORMAL LOW (ref 8.9–10.3)
Chloride: 96 mmol/L — ABNORMAL LOW (ref 98–111)
Creatinine: 5.23 mg/dL (ref 0.44–1.00)
GFR, Est AFR Am: 9 mL/min — ABNORMAL LOW (ref >60)
GFR, Estimated: 8 mL/min — ABNORMAL LOW (ref >60)
Glucose, Bld: 91 mg/dL (ref 70–99)
Potassium: 5.2 mmol/L — ABNORMAL HIGH (ref 3.5–5.1)
Sodium: 143 mmol/L (ref 135–145)
Total Bilirubin: 0.8 mg/dL (ref 0.3–1.2)
Total Protein: 6.6 g/dL (ref 6.5–8.1)

## 2019-03-29 NOTE — Progress Notes (Signed)
Hematology and Oncology Follow Up Visit  Whitney Chavez [id number] 1955/09/25 63 y.o. 03/29/2019   Principle Diagnosis:  Recurrent thromboembolic disease Heterozygous factor V Leiden mutation Prothrombin II gene mutation Calciphylaxis  Current Therapy:   Eliquis 5 mg po BID                                    Interim History:  Whitney Chavez is here today for follow-up. She is doing well and has no complaints at this time.  She was hospitalized in April for atrial fib, CHF and a fractured right fibula. Since that time she has recuperated nicely.  She is doing well on HD and goes M,W, F each week. Left upper arm fistula + thrill and bruit.  She bruises easily but not in excess. She will occasionally have a little blood on her toilet tissue when she strains with constipation due to hemorrhoids. She states that she has her routine follow-up with GI next month and is due for her colonoscopy this year. No other blood loss noted.  No tenderness, numbness or tingling in her extremities.  The swelling/lymphedema in her legs is much improved now that she is seen at the lymphedema clinic and performing lymphatic massage. She wears compression stockings and wraps her legs as well.  She uses her walker when ambulating for added support. No falls or syncopal episodes to report.  She has maintained a good appetite and is staying appropriately hydrated.   ECOG Performance Status: 1 - Symptomatic but completely ambulatory  Medications:  Allergies as of 03/29/2019      Reactions   Estrogens Other (See Comments)   Blood clots   Prednisone Other (See Comments)   Facial edema - has had this 2020 and had no difficulty Facial edema  "can take in small doses"   Progesterone Other (See Comments)   Blood clots   Other Other (See Comments)   Unable to take birth control pills due to clotting disorder/fim Fungal powder caused rash when on wound vac   Tape Rash   Please use paper tape      Medication List        Accurate as of March 29, 2019  9:44 AM. If you have any questions, ask your nurse or doctor.        acetaminophen 325 MG tablet Commonly known as: TYLENOL Take 325 mg by mouth daily as needed for headache (pain).   albuterol 108 (90 Base) MCG/ACT inhaler Commonly known as: VENTOLIN HFA Inhale 2 puffs into the lungs every 6 (six) hours as needed for wheezing or shortness of breath.   apixaban 5 MG Tabs tablet Commonly known as: Eliquis Take 1 tablet (5 mg total) by mouth 2 (two) times daily.   ARANESP IJ Inject as directed once a week. At dialysis center, dose varies.   cetirizine 10 MG tablet Commonly known as: ZYRTEC Take 10 mg by mouth daily.   docusate sodium 100 MG capsule Commonly known as: COLACE Take 100 mg by mouth 2 (two) times daily.   gabapentin 100 MG capsule Commonly known as: NEURONTIN Take 1 capsule (100 mg total) by mouth 4 (four) times daily.   lanthanum 1000 MG chewable tablet Commonly known as: FOSRENOL Chew 250-500 mg by mouth See admin instructions. Chew 1 tablet (500 mg) by mouth three times daily with meals, chew 1/4 tablet (250 mg) with snacks   Melatonin 10 MG  Tabs Take 10-20 mg by mouth at bedtime as needed (sleep).   nortriptyline 10 MG capsule Commonly known as: PAMELOR Take 20 mg by mouth at bedtime.   omeprazole 20 MG capsule Commonly known as: PRILOSEC Take 20 mg by mouth daily as needed (acid reflux). Rarely takes   ondansetron 4 MG disintegrating tablet Commonly known as: ZOFRAN-ODT Take 4 mg by mouth every 3 (three) days.   PARSABIV IV Inject into the vein.   PRESCRIPTION MEDICATION Inhale into the lungs at bedtime. CPAP with nasal pillow   RENAL VITAMIN PO Take 1 tablet by mouth daily.   rOPINIRole 2 MG tablet Commonly known as: REQUIP Take 1 tablet (2 mg total) by mouth at bedtime.   rOPINIRole 0.5 MG tablet Commonly known as: Requip Take 1-2 tablets in the morning po.   Spiriva Respimat 2.5 MCG/ACT Aers  Generic drug: Tiotropium Bromide Monohydrate Inhale 2.5 mcg into the lungs daily. 2 puffs once daily   Tiotropium Bromide Monohydrate 2.5 MCG/ACT Aers Commonly known as: Spiriva Respimat Inhale 2 puffs into the lungs daily.   sulfaSALAzine 500 MG tablet Commonly known as: AZULFIDINE Take 1,000 mg by mouth 2 (two) times daily.   Symbicort 160-4.5 MCG/ACT inhaler Generic drug: budesonide-formoterol Inhale into the lungs.   traMADol 50 MG tablet Commonly known as: ULTRAM Take 50 mg by mouth 2 (two) times daily as needed (pain).   triamcinolone cream 0.1 % Commonly known as: KENALOG APPLY CREAM EXTERNALLY ONCE DAILY AS NEEDED FOR 30 DAYS       Allergies:  Allergies  Allergen Reactions  . Estrogens Other (See Comments)    Blood clots  . Prednisone Other (See Comments)    Facial edema - has had this 2020 and had no difficulty Facial edema  "can take in small doses"  . Progesterone Other (See Comments)    Blood clots  . Other Other (See Comments)    Unable to take birth control pills due to clotting disorder/fim Fungal powder caused rash when on wound vac  . Tape Rash    Please use paper tape    Past Medical History, Surgical history, Social history, and Family History were reviewed and updated.  Review of Systems: All other 10 point review of systems is negative.   Physical Exam:  vitals were not taken for this visit.   Wt Readings from Last 3 Encounters:  11/30/18 247 lb (112 kg)  11/25/18 250 lb 3.2 oz (113.5 kg)  08/03/18 250 lb 4 oz (113.5 kg)    Ocular: Sclerae unicteric, pupils equal, round and reactive to light Ear-nose-throat: Oropharynx clear, dentition fair Lymphatic: No cervical or supraclavicular adenopathy Lungs no rales or rhonchi, good excursion bilaterally Heart regular rate and rhythm, no murmur appreciated Abd soft, nontender, positive bowel sounds, no liver or spleen tip palpated on exam, no fluid wave  MSK no focal spinal tenderness, no  joint edema Neuro: non-focal, well-oriented, appropriate affect Breasts: Deferred    Lab Results  Component Value Date   WBC 4.7 03/29/2019   HGB 10.4 (L) 03/29/2019   HCT 32.6 (L) 03/29/2019   MCV 98.8 03/29/2019   PLT 118 (L) 03/29/2019   Lab Results  Component Value Date   FERRITIN 1,006 (H) 11/30/2018   IRON 50 11/30/2018   TIBC 201 (L) 11/30/2018   UIBC 152 11/30/2018   IRONPCTSAT 25 11/30/2018   Lab Results  Component Value Date   RBC 3.30 (L) 03/29/2019   No results found for: KPAFRELGTCHN, LAMBDASER, KAPLAMBRATIO No  results found for: Kandis Cocking, IGMSERUM No results found for: Odetta Pink, SPEI   Chemistry      Component Value Date/Time   NA 140 11/30/2018 0921   NA 144 06/25/2017 1146   K 3.9 11/30/2018 0921   K 4.1 06/25/2017 1146   CL 96 (L) 11/30/2018 0921   CL 103 06/25/2017 1146   CO2 30 11/30/2018 0921   CO2 31 06/25/2017 1146   BUN 22 11/30/2018 0921   BUN 27 (H) 06/25/2017 1146   CREATININE 4.03 (HH) 11/30/2018 0921   CREATININE 4.7 (HH) 06/25/2017 1146      Component Value Date/Time   CALCIUM 8.5 (L) 11/30/2018 0921   CALCIUM 9.7 06/25/2017 1146   ALKPHOS 101 11/30/2018 0921   ALKPHOS 60 06/25/2017 1146   AST 23 11/30/2018 0921   ALT 9 11/30/2018 0921   ALT 14 06/25/2017 1146   BILITOT 0.8 11/30/2018 0921       Impression and Plan: Whitney Chavez is a very pleasant 63 yo caucasian female with history of recurrent thrombosis. She has history of both factor V leiden and prothrombin gene mutation.  She is doing well on Eliquis and will continue on her same regimen.  We will see her back in another 4 months.  She will contact our office with any questions or concerns. We can certainly see her sooner if needed.   Laverna Peace, NP 7/14/20209:44 AM

## 2019-03-29 NOTE — Telephone Encounter (Signed)
Pt aware COVID test negative Nothing further needed.

## 2019-03-29 NOTE — Telephone Encounter (Signed)
Pt is calling back [phone]

## 2019-04-05 ENCOUNTER — Encounter: Payer: Self-pay | Admitting: Neurology

## 2019-04-05 ENCOUNTER — Other Ambulatory Visit: Payer: Self-pay

## 2019-04-05 ENCOUNTER — Ambulatory Visit (INDEPENDENT_AMBULATORY_CARE_PROVIDER_SITE_OTHER): Payer: Medicare Other | Admitting: Neurology

## 2019-04-05 VITALS — BP 124/55 | HR 58 | Temp 97.7°F | Ht 60.5 in | Wt 233.0 lb

## 2019-04-05 DIAGNOSIS — G4733 Obstructive sleep apnea (adult) (pediatric): Secondary | ICD-10-CM

## 2019-04-05 DIAGNOSIS — N186 End stage renal disease: Secondary | ICD-10-CM | POA: Diagnosis not present

## 2019-04-05 DIAGNOSIS — R269 Unspecified abnormalities of gait and mobility: Secondary | ICD-10-CM

## 2019-04-05 DIAGNOSIS — G629 Polyneuropathy, unspecified: Secondary | ICD-10-CM | POA: Diagnosis not present

## 2019-04-05 DIAGNOSIS — Z992 Dependence on renal dialysis: Secondary | ICD-10-CM

## 2019-04-05 DIAGNOSIS — G2581 Restless legs syndrome: Secondary | ICD-10-CM

## 2019-04-05 MED ORDER — ROPINIROLE HCL 0.5 MG PO TABS: ORAL_TABLET | ORAL | 3 refills | Status: DC

## 2019-04-05 MED ORDER — ROPINIROLE HCL 2 MG PO TABS: 2.0000 mg | ORAL_TABLET | Freq: Every day | ORAL | 3 refills | Status: DC

## 2019-04-05 MED ORDER — GABAPENTIN 100 MG PO CAPS: 100.0000 mg | ORAL_CAPSULE | Freq: Four times a day (QID) | ORAL | 3 refills | Status: DC

## 2019-04-05 NOTE — Progress Notes (Addendum)
[facility]  PATIENT: Whitney Chavez DOB: Jan 19, 1956  REFERRING DOCTOR OR PCP:  none SOURCE: patient and records from [facility]  _________________________________   HISTORICAL  CHIEF COMPLAINT:  Chief Complaint  Patient presents with  . Follow-up    RM 12, alone. Last seen 04/01/18. On dialysis M, W, Fri. Has left upper arm fistula.   . Polyneuropathy    Takes gabapentin  . Sleep Apnea    Uses CPAP. No issues. Uses every night. DME: [facility]  . RLS    Takes requip  . Gait Problem     Ambulates with rolling walker. Wearing compression socks/wraps for Lymphedema.      HISTORY OF PRESENT ILLNESS:  Whitney Chavez is a 62 year old woman with Obstructive sleep apnea , insomnia, restless leg syndrome and polyneuropathy.      Update 04/05/2019: She feels gabapentin and ropinirole is helping her RLS.    The gabapentin also helps her neuropathic pain in her feet .   The tramadol started for another indication also helps.   She also developed lymphedema and sees a specialist at [facility].    She hsa lost 17 pounds of fluid  She has ESRD on HD and developed calciphylaxis with lumps of calcium in her skin.  Now treated with sodium thiopental after her HD treatments.    Due to pain she has been placed on tramadol for the calcitosis.    She is on tramadol 50 mg twice a day for the pain.   She tolerates it very well.  She has OSA and is doing well.   She sleeps through the night and feels refresehd when she wakes up.    She is on +11 cm H2O pressure.   She wears it for naps if she takes one at home.    She does doze off at ESRD (can't wear CPAP there) but not with other activities.     Update 04/01/2017: She has had more RLS and neuropathy/.dysesthesia symptoms.    RLS is much worse with dialysis.       She is currently taking gabapentin 100 mg x 3 daily (one at dialysis)   She is on 1/2 mg ropinirole during the day and 2 mg at night.     She has calciphylaxis and  has a lot of calcification issues with subcutaneous large nodules.   Also has Dercum's disease.   These issues have caused the lesions 'eaten' throug the skin and she has abdominal and hip lesions.   She is being treated for these issues.   She does wound care there.     She also sees pain management and is on tramadol.     Her severe OSA does well and she uses CPAP daily and can't sleep well without it.     RLS bothers her just mildly at .night.   Insomnia is variable, bad last night  She needs new orthotics for her shoes  From 03/31/2017: She is having more difficulty getting around her house to perform activities of daily living and would benefit from a power vehicle such as a scooter.  Restless leg syndrome/polyneuropathy:  She gets RLS, worse with HD.   A combination of gabapentin with ropinirole have greatly helped.   Her RLS was severe and occurred day and night, worse after each HD session.   She feels she sleeps better with the RLS med's.     She has polyneuropathy with numbness up to her ankles and has been  stable the past year. . A nerve conduction study/EMG February 2015 showed a length dependent sensory motor axonal polyneuropathy with active features in the lower leg history she had a superimposed mild-to-moderate right carpal tunnel syndrome.   She has special orthotics that help her foot pain some.    Gait:   Due to the polyneuropathy, she has a gait disturbance and has occasional falls. She uses a cane but has difficulty walking longer distances   OSA:  She is sleeping well with CPAP  +10 cm every night.   A PSG study February 2015 showed an AHI equals 44.7 with very poor sleep efficiency. Due to the poor sleep efficiency she had AutoPap as an outpatient to avoid another night in the laboratory.   She does not note much sleepiness but may lay nod off for a few minutes around 3 pm and then does well the rest of the day.     Insomnia:  She is sleeping much better on the combination of  CPAP, gabapentin and ropinirole.  She has sleep maintenance more than sleep onset insomnia. She takes 400 mg gabapentin with benefit. Insomnia is worse night after hemodialysis.  Other: She is on hemodialysis for CRF due to vitamin D toxicity. She has had anemia associated with ESRD.    She has painful lumps in her skin and subcutaneous tissue felt due to the Aranesp/Epogen.     REVIEW OF SYSTEMS: Constitutional: No fevers, chills, sweats, or change in appetite Eyes: No visual changes, double vision, eye pain Ear, nose and throat: No hearing loss, ear pain, nasal congestion, sore throat Cardiovascular: No chest pain, palpitations Respiratory: No shortness of breath at rest or with exertion.   No wheezes GastrointestinaI: No nausea, vomiting, diarrhea, abdominal pain, fecal incontinence Genitourinary: She is on hemodialysis. Musculoskeletal: No neck pain, back pain Integumentary: No rash, pruritus, skin lesions Neurological: as above Psychiatric: No depression at this time.  No anxiety Endocrine: No palpitations, diaphoresis, change in appetite, change in weigh or increased thirst Hematologic/Lymphatic: No anemia, purpura, petechiae. Allergic/Immunologic: No itchy/runny eyes, nasal congestion, recent allergic reactions, rashes  ALLERGIES: Allergies  Allergen Reactions  . Estrogens Other (See Comments)    Blood clots  . Other Other (See Comments)    Unable to take birth control pills due to clotting disorder/fim  . Prednisone Other (See Comments)    Facial edema - has had this 2020 and had no difficulty "can take in small doses"  . Progesterone Other (See Comments)    Blood clots  . Tape Rash    Please use paper tape    HOME MEDICATIONS:  Current Outpatient Medications:  .  acetaminophen (TYLENOL) 325 MG tablet, Take 325 mg by mouth daily as needed for headache (pain). , Disp: , Rfl:  .  albuterol (PROVENTIL HFA;VENTOLIN HFA) 108 (90 Base) MCG/ACT inhaler, Inhale 2 puffs  into the lungs every 6 (six) hours as needed for wheezing or shortness of breath., Disp: 1 Inhaler, Rfl: 2 .  amiodarone (PACERONE) 200 MG tablet, Take 200 mg by mouth daily., Disp: , Rfl:  .  apixaban (ELIQUIS) 5 MG TABS tablet, Take 1 tablet (5 mg total) by mouth 2 (two) times daily., Disp: 60 tablet, Rfl: 12 .  atorvastatin (LIPITOR) 20 MG tablet, Take 20 mg by mouth daily. , Disp: , Rfl:  .  B Complex-C-Folic Acid (RENAL VITAMIN PO), Take 1 tablet by mouth daily., Disp: , Rfl:  .  budesonide-formoterol (SYMBICORT) 160-4.5 MCG/ACT inhaler, Inhale into the lungs.,  Disp: , Rfl:  .  carvedilol (COREG) 25 MG tablet, Take by mouth 2 (two) times a day., Disp: , Rfl:  .  cetirizine (ZYRTEC) 10 MG tablet, Take 10 mg by mouth daily., Disp: , Rfl:  .  Darbepoetin Alfa-Albumin (ARANESP IJ), Inject as directed once a week. At dialysis center, dose varies., Disp: , Rfl:  .  Etelcalcetide HCl (PARSABIV IV), Inject into the vein., Disp: , Rfl:  .  gabapentin (NEURONTIN) 100 MG capsule, Take 1 capsule (100 mg total) by mouth 4 (four) times daily., Disp: 360 capsule, Rfl: 3 .  lanthanum (FOSRENOL) 1000 MG chewable tablet, Chew 250-500 mg by mouth See admin instructions. Chew 1 tablet (500 mg) by mouth three times daily with meals, chew 1/4 tablet (250 mg) with snacks, Disp: , Rfl:  .  losartan (COZAAR) 50 MG tablet, Take 50 mg by mouth daily., Disp: , Rfl:  .  Melatonin 10 MG TABS, Take 10-20 mg by mouth at bedtime as needed (sleep). , Disp: , Rfl:  .  nortriptyline (PAMELOR) 10 MG capsule, Take 20 mg by mouth at bedtime. , Disp: , Rfl:  .  omeprazole (PRILOSEC) 20 MG capsule, Take 20 mg by mouth daily as needed (acid reflux). Rarely takes, Disp: , Rfl:  .  ondansetron (ZOFRAN-ODT) 4 MG disintegrating tablet, Take 4 mg by mouth every 3 (three) days., Disp: , Rfl:  .  PRESCRIPTION MEDICATION, Inhale into the lungs at bedtime. CPAP with nasal pillow, Disp: , Rfl:  .  rOPINIRole (REQUIP) 0.5 MG tablet, Take 1-2  tablets in the morning po., Disp: 180 tablet, Rfl: 3 .  rOPINIRole (REQUIP) 2 MG tablet, Take 1 tablet (2 mg total) by mouth at bedtime., Disp: 90 tablet, Rfl: 3 .  sulfaSALAzine (AZULFIDINE) 500 MG tablet, Take 1,000 mg by mouth 2 (two) times daily. , Disp: , Rfl:  .  Tiotropium Bromide Monohydrate (SPIRIVA RESPIMAT) 2.5 MCG/ACT AERS, Inhale 2.5 mcg into the lungs daily. 2 puffs once daily, Disp: , Rfl:  .  traMADol (ULTRAM) 50 MG tablet, Take 50 mg by mouth 2 (two) times daily as needed (pain). , Disp: , Rfl:  .  triamcinolone cream (KENALOG) 0.1 %, APPLY CREAM EXTERNALLY ONCE DAILY AS NEEDED FOR 30 DAYS, Disp: , Rfl:   PAST MEDICAL HISTORY: Past Medical History:  Diagnosis Date  . Asthma   . Axillary vein thrombosis ([place]) 08/16/2015  . Calciphylaxis   . Chronic kidney disease   . Colitis   . COPD (chronic obstructive pulmonary disease) ([place])   . Hypertension   . Lymphedema    legs  . MRSA (methicillin resistant staph aureus) culture positive   . Neuropathy   . Obstructive sleep apnea 03/29/2015  . OSA on CPAP   . Vision abnormalities     PAST SURGICAL HISTORY: Past Surgical History:  Procedure Laterality Date  . APPLICATION OF WOUND VAC    . AV FISTULA PLACEMENT Left   . lumbar decompression fusion    . TONSILLECTOMY      FAMILY HISTORY: Family History  Problem Relation Age of Onset  . Diabetes Mother   . Congestive Heart Failure Mother   . COPD Mother   . Stroke Mother   . Neuropathy Mother   . Heart disease Father   . Stroke Father   . Cancer Brother   . Diabetes Brother   . Diabetes Brother   . Alcohol abuse Brother     SOCIAL HISTORY:  Social History   Socioeconomic History  .  Marital status: Single    Spouse name: Not on file  . Number of children: Not on file  . Years of education: Not on file  . Highest education level: Not on file  Occupational History  . Not on file  Social Needs  . Financial resource strain: Not on file  . Food insecurity     Worry: Not on file    Inability: Not on file  . Transportation needs    Medical: Not on file    Non-medical: Not on file  Tobacco Use  . Smoking status: Former Smoker    Packs/day: 2.00    Years: 30.00    Pack years: 60.00    Quit date: 11/25/1998    Years since quitting: 20.3  . Smokeless tobacco: Never Used  Substance and Sexual Activity  . Alcohol use: No    Alcohol/week: 0.0 standard drinks  . Drug use: No  . Sexual activity: Not on file  Lifestyle  . Physical activity    Days per week: Not on file    Minutes per session: Not on file  . Stress: Not on file  Relationships  . Social Herbalist on phone: Not on file    Gets together: Not on file    Attends religious service: Not on file    Active member of club or organization: Not on file    Attends meetings of clubs or organizations: Not on file    Relationship status: Not on file  . Intimate partner violence    Fear of current or ex partner: Not on file    Emotionally abused: Not on file    Physically abused: Not on file    Forced sexual activity: Not on file  Other Topics Concern  . Not on file  Social History Narrative  . Not on file     PHYSICAL EXAM  Vitals:   04/05/19 1057  BP: (!) 124/55  Pulse: (!) 58  Temp: 97.7 F (36.5 C)  Weight: 233 lb (105.7 kg)  Height: 5' 0.5" (1.537 m)    Body mass index is 44.76 kg/m.   Chavez: The patient is well-developed and well-nourished and in no acute distress   Neurologic Exam  Mental status: The patient is alert and oriented x 3 at the time of the examination. The patient has apparent normal recent and remote memory, with an apparently normal attention span and concentration ability.   Speech is normal.  Cranial nerves: Extraocular muscles are intact. Facial strength and sensation is normal.  The tongue is midline, and the patient has symmetric elevation of the soft palate. No obvious hearing deficits are noted.  Motor:  Muscle bulk is normal.    Tone is normal. Strength is normal in the arms. Strength is 4+/5 in the foot and ankle extensors  Sensory: She has reduced sensation to vibration at the ankles.  There is reduced sensation to touch from the ankles down.  Coordination: Cerebellar testing reveals good finger-nose-finger bilaterally.  Heel-to-shin is poor.  Gait and station: Station is normal.   Gait is mildly wide.  She is unable to tandem walk.  The Romberg is positive.  Reflexes: Deep tendon reflexes are 1+ in arms and absent in knees and ankles.        ASSESSMENT AND PLAN    1. Obstructive sleep apnea   2. Polyneuropathy   3. End-stage renal disease on hemodialysis ([place])   4. Gait disturbance   5. Restless leg  1.   Continue gabapentin and ropinirole for her neuropathic pain restless leg syndrome.  She can take up to 400 mg gabapentin daily.    Continue tramadol 2.   She will continue CPAP +10cm .  Her machine is > 65 years old and she will need a new CPAP machine (and supplies).  She has benefited from CPAP for the treatment of her severe OSA (AHI = 44.7 in February 2015) 3.   Return in 1 year or sooner if there are new or worsening neurologic symptoms.   Mylez Venable A. Felecia Shelling, MD, PhD, [facility] Grimes  3/58/2518, 98:42 AM Certified in Neurology, Clinical Neurophysiology, Sleep Medicine, Pain Medicine and Neuroimaging  [facility] [address] [place], [place] [address] [phone]

## 2019-06-16 ENCOUNTER — Other Ambulatory Visit: Payer: Self-pay | Admitting: Pulmonary Disease

## 2019-06-16 ENCOUNTER — Telehealth: Payer: Self-pay | Admitting: Primary Care

## 2019-06-16 NOTE — Telephone Encounter (Signed)
Called spoke with patient. After speaking with supervisor, patient is not getting swabbed with [facility] and it would be 7days prior. Patient goes to dialysis and having a colonoscopy. Covid test needs to be closer to PFT.   covid swab scheduled for 07/25/19.  Nothing further needed at this time.

## 2019-06-17 ENCOUNTER — Telehealth: Payer: Self-pay | Admitting: Primary Care

## 2019-06-17 NOTE — Telephone Encounter (Signed)
Called and spoke to patient.  Rescheduled pre-procedure covid testing per patient request.  Nothing further needed at this time.

## 2019-06-23 ENCOUNTER — Other Ambulatory Visit: Payer: Medicare Other

## 2019-06-23 ENCOUNTER — Ambulatory Visit: Payer: Medicare Other | Admitting: Pulmonary Disease

## 2019-06-23 ENCOUNTER — Ambulatory Visit: Payer: Medicare Other | Admitting: Hematology & Oncology

## 2019-06-28 IMAGING — CR DG CHEST 2V
2 series · 2 of 2 positions shown · non-contrast
Comparison: None.

CLINICAL DATA: Cough, congestion and shortness of breath since
yesterday.

EXAM:
CHEST  2 VIEW

[w chest pa]
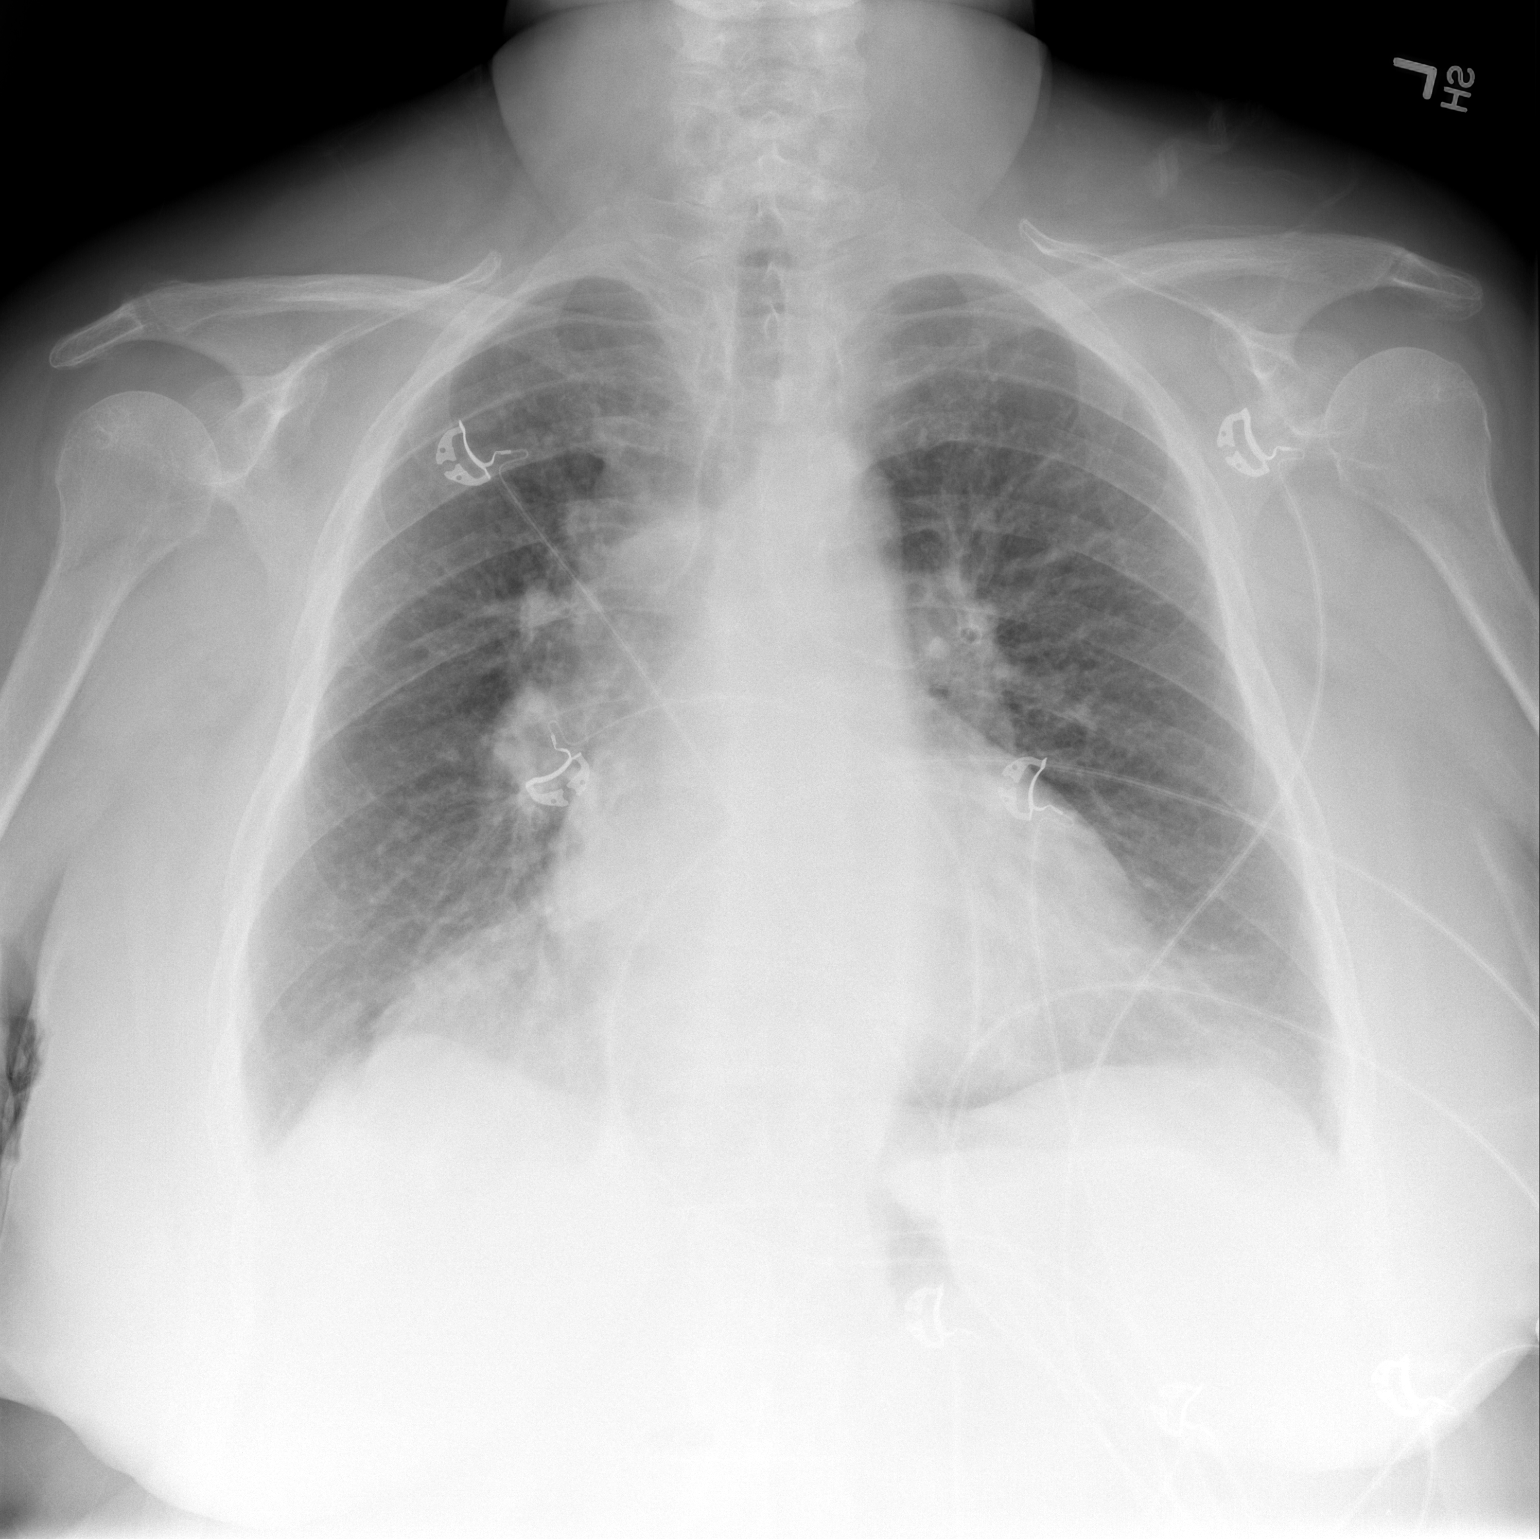

[w chest lat]
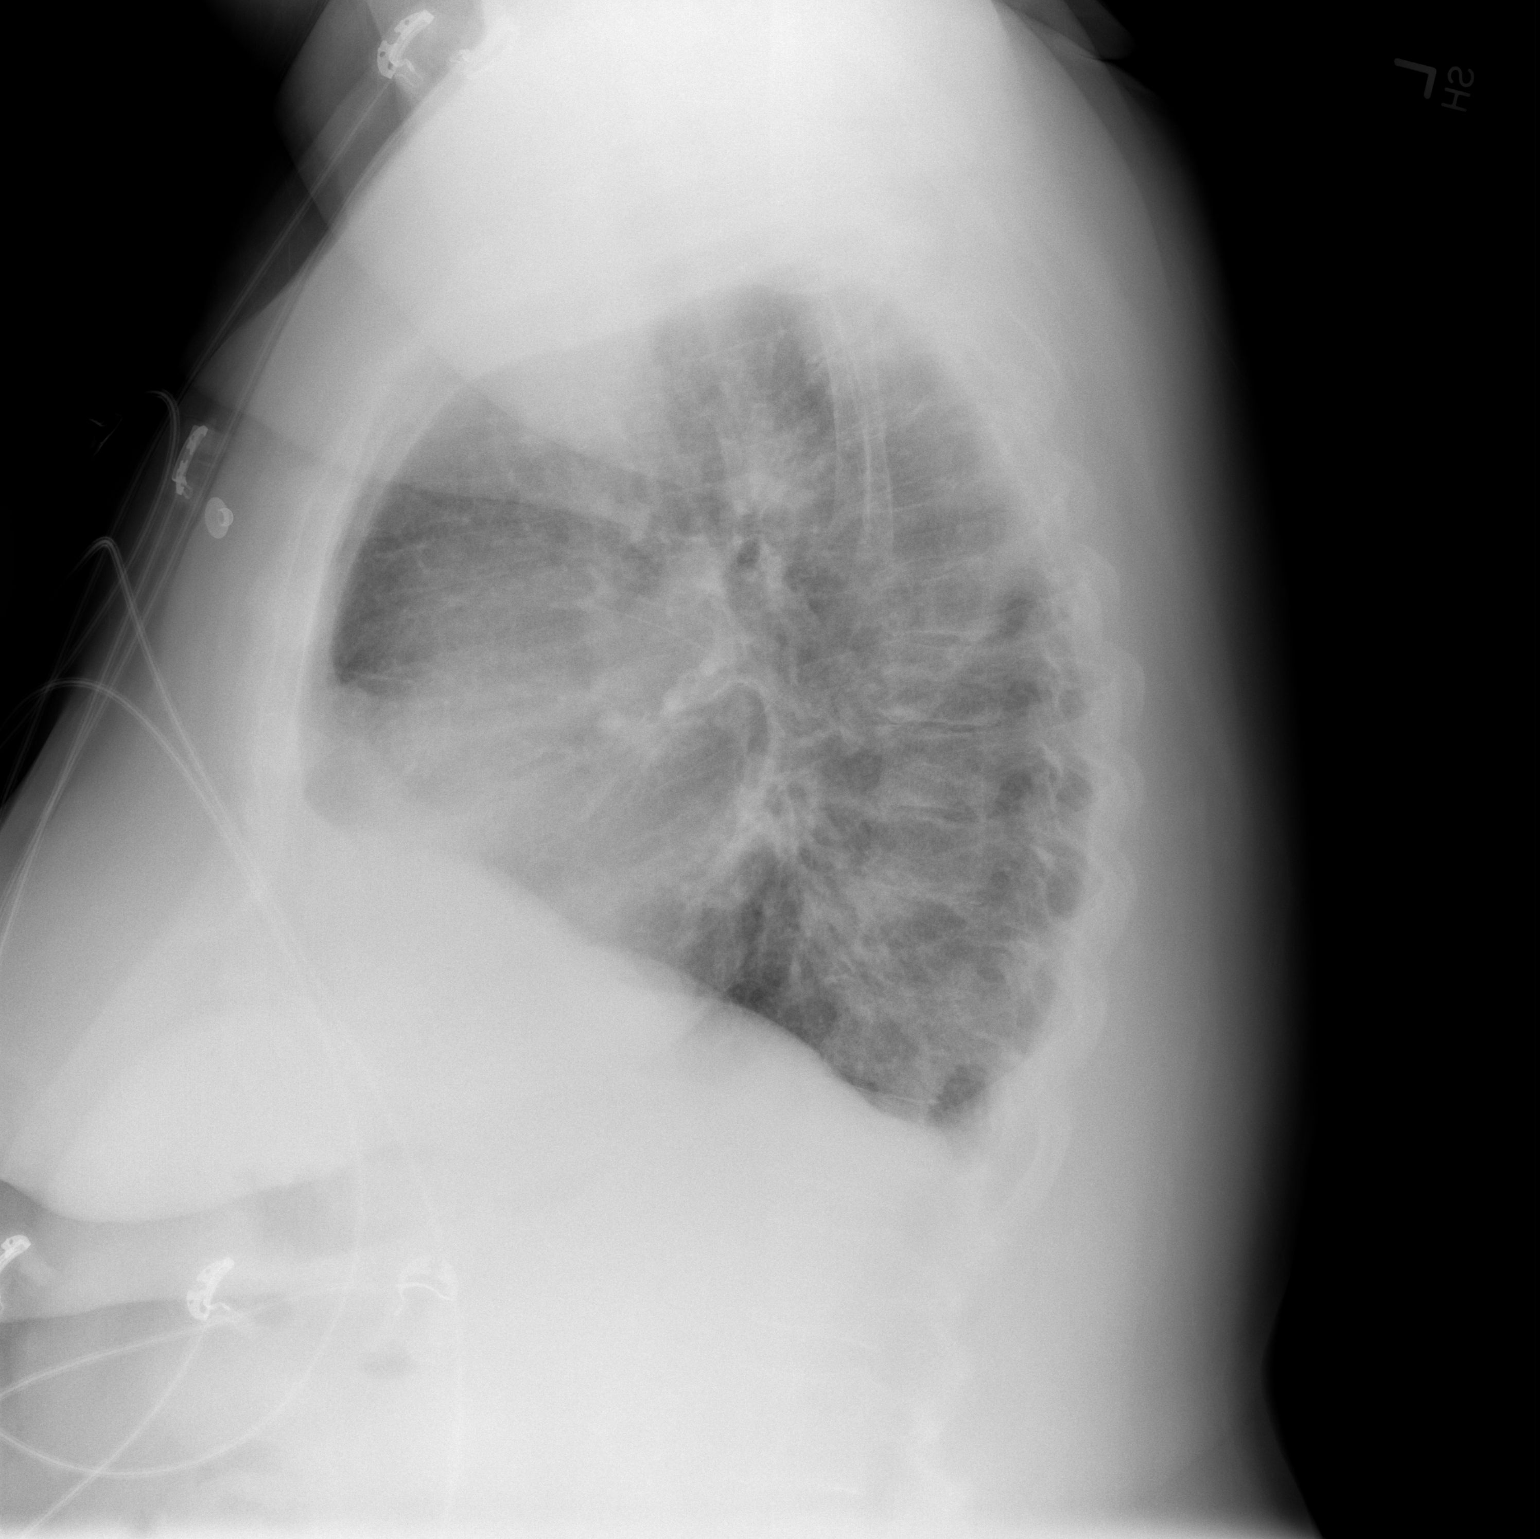

[2 of 2 positions shown; findings below may reference images not displayed]

FINDINGS: Cardiac silhouette is mildly enlarged. No mediastinal or hilar
masses. No convincing adenopathy.

Lungs are hyperexpanded. Prominent cardiophrenic angle fat pad at
the medial right lung base. No evidence of pneumonia. No pulmonary
edema. No convincing pleural effusion and no pneumothorax.

Skeletal structures are intact.
IMPRESSION: 1. No acute cardiopulmonary disease.
2. Mild cardiomegaly.

## 2019-07-14 ENCOUNTER — Other Ambulatory Visit: Payer: Self-pay

## 2019-07-14 ENCOUNTER — Inpatient Hospital Stay: Payer: Medicare Other | Attending: Hematology & Oncology

## 2019-07-14 ENCOUNTER — Telehealth: Payer: Self-pay | Admitting: *Deleted

## 2019-07-14 ENCOUNTER — Inpatient Hospital Stay (HOSPITAL_BASED_OUTPATIENT_CLINIC_OR_DEPARTMENT_OTHER): Payer: Medicare Other | Admitting: Hematology & Oncology

## 2019-07-14 VITALS — BP 143/70 | HR 59 | Temp 97.8°F | Resp 19 | Wt 226.0 lb

## 2019-07-14 DIAGNOSIS — D6851 Activated protein C resistance: Secondary | ICD-10-CM | POA: Insufficient documentation

## 2019-07-14 DIAGNOSIS — D649 Anemia, unspecified: Secondary | ICD-10-CM | POA: Diagnosis not present

## 2019-07-14 DIAGNOSIS — Z86718 Personal history of other venous thrombosis and embolism: Secondary | ICD-10-CM | POA: Diagnosis not present

## 2019-07-14 DIAGNOSIS — Z79899 Other long term (current) drug therapy: Secondary | ICD-10-CM | POA: Insufficient documentation

## 2019-07-14 DIAGNOSIS — Z7901 Long term (current) use of anticoagulants: Secondary | ICD-10-CM | POA: Insufficient documentation

## 2019-07-14 DIAGNOSIS — D509 Iron deficiency anemia, unspecified: Secondary | ICD-10-CM

## 2019-07-14 DIAGNOSIS — Z992 Dependence on renal dialysis: Secondary | ICD-10-CM | POA: Diagnosis not present

## 2019-07-14 DIAGNOSIS — Z7951 Long term (current) use of inhaled steroids: Secondary | ICD-10-CM | POA: Diagnosis not present

## 2019-07-14 LAB — CBC WITH DIFFERENTIAL (CANCER CENTER ONLY)
Abs Immature Granulocytes: 0.02 10*3/uL (ref 0.00–0.07)
Basophils Absolute: 0 10*3/uL (ref 0.0–0.1)
Basophils Relative: 1 %
Eosinophils Absolute: 0.2 10*3/uL (ref 0.0–0.5)
Eosinophils Relative: 6 %
HCT: 36 % (ref 36.0–46.0)
Hemoglobin: 11.3 g/dL — ABNORMAL LOW (ref 12.0–15.0)
Immature Granulocytes: 1 %
Lymphocytes Relative: 16 %
Lymphs Abs: 0.6 10*3/uL — ABNORMAL LOW (ref 0.7–4.0)
MCH: 32 pg (ref 26.0–34.0)
MCHC: 31.4 g/dL (ref 30.0–36.0)
MCV: 102 fL — ABNORMAL HIGH (ref 80.0–100.0)
Monocytes Absolute: 0.5 10*3/uL (ref 0.1–1.0)
Monocytes Relative: 13 %
Neutro Abs: 2.6 10*3/uL (ref 1.7–7.7)
Neutrophils Relative %: 63 %
Platelet Count: 117 10*3/uL — ABNORMAL LOW (ref 150–400)
RBC: 3.53 MIL/uL — ABNORMAL LOW (ref 3.87–5.11)
RDW: 15.2 % (ref 11.5–15.5)
WBC Count: 4 10*3/uL (ref 4.0–10.5)
nRBC: 0 % (ref 0.0–0.2)

## 2019-07-14 LAB — CMP (CANCER CENTER ONLY)
ALT: 7 U/L (ref 0–44)
AST: 19 U/L (ref 15–41)
Albumin: 4.6 g/dL (ref 3.5–5.0)
Alkaline Phosphatase: 64 U/L (ref 38–126)
Anion gap: 14 (ref 5–15)
BUN: 33 mg/dL — ABNORMAL HIGH (ref 8–23)
CO2: 30 mmol/L (ref 22–32)
Calcium: 8.8 mg/dL — ABNORMAL LOW (ref 8.9–10.3)
Chloride: 96 mmol/L — ABNORMAL LOW (ref 98–111)
Creatinine: 5.05 mg/dL (ref 0.44–1.00)
GFR, Est AFR Am: 10 mL/min — ABNORMAL LOW (ref >60)
GFR, Estimated: 8 mL/min — ABNORMAL LOW (ref >60)
Glucose, Bld: 84 mg/dL (ref 70–99)
Potassium: 5.1 mmol/L (ref 3.5–5.1)
Sodium: 140 mmol/L (ref 135–145)
Total Bilirubin: 0.8 mg/dL (ref 0.3–1.2)
Total Protein: 6.7 g/dL (ref 6.5–8.1)

## 2019-07-14 LAB — IRON AND TIBC
Iron: 113 ug/dL (ref 41–142)
Saturation Ratios: 60 % — ABNORMAL HIGH (ref 21–57)
TIBC: 188 ug/dL — ABNORMAL LOW (ref 236–444)
UIBC: 74 ug/dL — ABNORMAL LOW (ref 120–384)

## 2019-07-14 LAB — FERRITIN: Ferritin: 1144 ng/mL — ABNORMAL HIGH (ref 11–307)

## 2019-07-14 NOTE — Progress Notes (Signed)
Hematology and Oncology Follow Up Visit  Whitney Chavez [id number] August 29, 1956 63 y.o. 07/14/2019   Principle Diagnosis:   Recurrent thromboembolic disease  Heterozygous factor V Leiden mutation  Prothrombin II gene mutation  Calciphylaxis  Current Therapy:    Eliquis 5 mg po BID     Interim History:  Whitney Chavez is back for follow-up.  She is doing pretty well.  She has had a colonoscopy I think in a week or so.  I told her that she stop the Eliquis a couple days beforehand.  She does not need to be on subcutaneous heparin.  I really do not think that she will develop a thrombus in 2 days of being off Eliquis.  She has had no issues with respect to her kidneys.  She is getting dialysis.  She really would like to have a transplant to get off of dialysis.  I am not sure if this is going to happen.  She has had no fever.  The calciphylaxis has been relatively stable.  She has a few areas that do cause occasional problems with flareups.  Her heart is doing okay.  I think she is on amiodarone.  Her iron studies that we done her today showed a ferritin of 1144 with an iron saturation of 60%.  Overall, I would say that her performance status is ECOG 1.   Medications:  Current Outpatient Medications:  .  Tiotropium Bromide Monohydrate (SPIRIVA RESPIMAT IN), Inhale into the lungs., Disp: , Rfl:  .  acetaminophen (TYLENOL) 325 MG tablet, Take 325 mg by mouth daily as needed for headache (pain). , Disp: , Rfl:  .  albuterol (PROVENTIL HFA;VENTOLIN HFA) 108 (90 Base) MCG/ACT inhaler, Inhale 2 puffs into the lungs every 6 (six) hours as needed for wheezing or shortness of breath., Disp: 1 Inhaler, Rfl: 2 .  amiodarone (PACERONE) 200 MG tablet, Take 200 mg by mouth daily., Disp: , Rfl:  .  apixaban (ELIQUIS) 5 MG TABS tablet, Take 1 tablet (5 mg total) by mouth 2 (two) times daily., Disp: 60 tablet, Rfl: 12 .  atorvastatin (LIPITOR) 20 MG tablet, Take 20 mg by mouth daily. , Disp: , Rfl:   .  B Complex-C-Folic Acid (RENAL VITAMIN PO), Take 1 tablet by mouth daily., Disp: , Rfl:  .  budesonide-formoterol (SYMBICORT) 160-4.5 MCG/ACT inhaler, Inhale into the lungs., Disp: , Rfl:  .  carvedilol (COREG) 25 MG tablet, Take by mouth 2 (two) times a day., Disp: , Rfl:  .  cetirizine (ZYRTEC) 10 MG tablet, Take 10 mg by mouth daily., Disp: , Rfl:  .  Darbepoetin Alfa-Albumin (ARANESP IJ), Inject as directed once a week. At dialysis center, dose varies., Disp: , Rfl:  .  Etelcalcetide HCl (PARSABIV IV), Inject into the vein., Disp: , Rfl:  .  gabapentin (NEURONTIN) 100 MG capsule, Take 1 capsule (100 mg total) by mouth 4 (four) times daily., Disp: 360 capsule, Rfl: 3 .  lanthanum (FOSRENOL) 1000 MG chewable tablet, Chew 250-500 mg by mouth See admin instructions. Chew 1 tablet (500 mg) by mouth three times daily with meals, chew 1/4 tablet (250 mg) with snacks, Disp: , Rfl:  .  losartan (COZAAR) 50 MG tablet, Take 50 mg by mouth daily., Disp: , Rfl:  .  Melatonin 10 MG TABS, Take 10-20 mg by mouth at bedtime as needed (sleep). , Disp: , Rfl:  .  nortriptyline (PAMELOR) 10 MG capsule, Take 20 mg by mouth at bedtime. , Disp: , Rfl:  .  omeprazole (PRILOSEC) 20 MG capsule, Take 20 mg by mouth daily as needed (acid reflux). Rarely takes, Disp: , Rfl:  .  ondansetron (ZOFRAN-ODT) 4 MG disintegrating tablet, Take 4 mg by mouth every 3 (three) days., Disp: , Rfl:  .  PRESCRIPTION MEDICATION, Inhale into the lungs at bedtime. CPAP with nasal pillow, Disp: , Rfl:  .  rOPINIRole (REQUIP) 0.5 MG tablet, Take 1-2 tablets in the morning po., Disp: 180 tablet, Rfl: 3 .  rOPINIRole (REQUIP) 2 MG tablet, Take 1 tablet (2 mg total) by mouth at bedtime., Disp: 90 tablet, Rfl: 3 .  sulfaSALAzine (AZULFIDINE) 500 MG tablet, Take 1,000 mg by mouth 2 (two) times daily. , Disp: , Rfl:  .  traMADol (ULTRAM) 50 MG tablet, Take 50 mg by mouth 2 (two) times daily as needed (pain). , Disp: , Rfl:  .  triamcinolone cream  (KENALOG) 0.1 %, APPLY CREAM EXTERNALLY ONCE DAILY AS NEEDED FOR 30 DAYS, Disp: , Rfl:   Allergies:  Allergies  Allergen Reactions  . Estrogens Other (See Comments)    Blood clots  . Other Other (See Comments)    Unable to take birth control pills due to clotting disorder/fim  . Prednisone Other (See Comments)    Facial edema - has had this 2020 and had no difficulty "can take in small doses"  . Progesterone Other (See Comments)    Blood clots  . Tape Rash    Please use paper tape    Past Medical History, Surgical history, Social history, and Family History were reviewed and updated.  Review of Systems: Review of Systems  Constitutional: Negative.   HENT: Negative.   Eyes: Negative.   Respiratory: Negative.   Cardiovascular: Negative.   Gastrointestinal: Negative.   Genitourinary: Negative.   Musculoskeletal: Negative.   Skin: Positive for rash.  Neurological: Negative.   Endo/Heme/Allergies: Negative.   Psychiatric/Behavioral: Negative.      Physical Exam:  weight is 226 lb (102.5 kg). Her temporal temperature is 97.8 F (36.6 C). Her blood pressure is 143/70 (abnormal) and her pulse is 59 (abnormal). Her respiration is 19 and oxygen saturation is 93%.   Wt Readings from Last 3 Encounters:  07/14/19 226 lb (102.5 kg)  04/05/19 233 lb (105.7 kg)  03/29/19 233 lb (105.7 kg)     Physical Exam Vitals signs reviewed.  HENT:     Head: Normocephalic and atraumatic.  Eyes:     Pupils: Pupils are equal, round, and reactive to light.  Neck:     Musculoskeletal: Normal range of motion.  Cardiovascular:     Rate and Rhythm: Normal rate and regular rhythm.     Heart sounds: Normal heart sounds.  Pulmonary:     Effort: Pulmonary effort is normal.     Breath sounds: Normal breath sounds.  Abdominal:     General: Bowel sounds are normal.     Palpations: Abdomen is soft.     Comments: Abdominal exam shows an obese abdomen.  She has an extended laparotomy scar in the  right lower quadrant.  This is healing nicely.  She does have some subcutaneous nodules which are consistent with the calciphylaxis.  Musculoskeletal: Normal range of motion.        General: No tenderness or deformity.     Comments: She has marked swelling of the right lower leg.  There is no erythema.  There is no tenderness to palpation.  She is a negative Homans sign.  Lymphadenopathy:     Cervical:  No cervical adenopathy.  Skin:    General: Skin is warm and dry.     Findings: No erythema or rash.  Neurological:     Mental Status: She is alert and oriented to person, place, and time.  Psychiatric:        Behavior: Behavior normal.        Thought Content: Thought content normal.        Judgment: Judgment normal.     Lab Results  Component Value Date   WBC 4.0 07/14/2019   HGB 11.3 (L) 07/14/2019   HCT 36.0 07/14/2019   MCV 102.0 (H) 07/14/2019   PLT 117 (L) 07/14/2019     Chemistry      Component Value Date/Time   NA 140 07/14/2019 0943   NA 144 06/25/2017 1146   K 5.1 07/14/2019 0943   K 4.1 06/25/2017 1146   CL 96 (L) 07/14/2019 0943   CL 103 06/25/2017 1146   CO2 30 07/14/2019 0943   CO2 31 06/25/2017 1146   BUN 33 (H) 07/14/2019 0943   BUN 27 (H) 06/25/2017 1146   CREATININE 5.05 (HH) 07/14/2019 0943   CREATININE 4.7 (HH) 06/25/2017 1146      Component Value Date/Time   CALCIUM 8.8 (L) 07/14/2019 0943   CALCIUM 9.7 06/25/2017 1146   ALKPHOS 64 07/14/2019 0943   ALKPHOS 60 06/25/2017 1146   AST 19 07/14/2019 0943   ALT 7 07/14/2019 0943   ALT 14 06/25/2017 1146   BILITOT 0.8 07/14/2019 0943         Impression and Plan: Whitney Chavez is a 63 year old white female. She has history of recurrent thrombosis. She has both factor V Leiden and prothrombin II gene mutation.  She is on Eliquis.  Eliquis is doing very well for her.  She needs lifelong Eliquis.  We will now plan to get her back in about 3-4 months.  Everything from my point of view is doing okay.  I  am not sure if the lymphedema will ever improve.  I know that physical therapy is trying hard for her and I think that the leg wrappings do seem to be helping her.Volanda Napoleon, MD 10/29/20204:19 PM

## 2019-07-14 NOTE — Telephone Encounter (Signed)
Dr. Marin Olp notified of creatinine-5.05.  No new orders received at this time.

## 2019-07-15 ENCOUNTER — Telehealth: Payer: Self-pay | Admitting: Hematology & Oncology

## 2019-07-15 MED FILL — BISACODYL EC 5 MG TBEC: 5 | 100 days supply | Qty: 100 | Fill #0

## 2019-07-15 MED FILL — PEG-3350 SOLUTION: 420 | 1 days supply | Qty: 4000 | Fill #0

## 2019-07-15 NOTE — Telephone Encounter (Signed)
lmom to inform patient of 4/29 appt at 0900 per 10/29 LOS

## 2019-07-22 MED FILL — HEPARIN SOD 5,000 UNIT/ML V: 5000 | 2 days supply | Qty: 6 | Fill #0

## 2019-07-25 ENCOUNTER — Other Ambulatory Visit (HOSPITAL_COMMUNITY)
Admission: RE | Admit: 2019-07-25 | Discharge: 2019-07-25 | Disposition: A | Discharge status: Home / Self Care | Payer: Medicare Other | Source: Ambulatory Visit | Attending: Pulmonary Disease | Admitting: Pulmonary Disease

## 2019-07-25 ENCOUNTER — Other Ambulatory Visit (HOSPITAL_COMMUNITY): Payer: Medicare Other

## 2019-07-25 DIAGNOSIS — Z01812 Encounter for preprocedural laboratory examination: Secondary | ICD-10-CM | POA: Insufficient documentation

## 2019-07-25 DIAGNOSIS — Z20828 Contact with and (suspected) exposure to other viral communicable diseases: Secondary | ICD-10-CM | POA: Diagnosis not present

## 2019-07-26 ENCOUNTER — Other Ambulatory Visit: Payer: Medicare Other

## 2019-07-26 ENCOUNTER — Ambulatory Visit: Payer: Medicare Other | Admitting: Hematology & Oncology

## 2019-07-26 LAB — NOVEL CORONAVIRUS, NAA (HOSP ORDER, SEND-OUT TO REF LAB; TAT 18-24 HRS): SARS-CoV-2, NAA: NOT DETECTED

## 2019-07-27 ENCOUNTER — Telehealth: Payer: Self-pay | Admitting: Neurology

## 2019-07-27 ENCOUNTER — Other Ambulatory Visit: Payer: Self-pay | Admitting: Neurology

## 2019-07-27 DIAGNOSIS — G4733 Obstructive sleep apnea (adult) (pediatric): Secondary | ICD-10-CM

## 2019-07-27 NOTE — Telephone Encounter (Signed)
Order has been placed and is in epic. I will send order to [facility] through community message for them to pull for the patient.

## 2019-07-27 NOTE — Telephone Encounter (Signed)
Pt called stating that she is needing a new prescription faxed to [facility] for a new cpap machine and supply's. Pt gave the fax number of [phone] Please advise.

## 2019-07-28 ENCOUNTER — Ambulatory Visit (INDEPENDENT_AMBULATORY_CARE_PROVIDER_SITE_OTHER): Payer: Medicare Other | Admitting: Primary Care

## 2019-07-28 ENCOUNTER — Encounter: Payer: Self-pay | Admitting: Primary Care

## 2019-07-28 ENCOUNTER — Other Ambulatory Visit: Payer: Self-pay

## 2019-07-28 ENCOUNTER — Ambulatory Visit (INDEPENDENT_AMBULATORY_CARE_PROVIDER_SITE_OTHER): Payer: Medicare Other | Admitting: Pulmonary Disease

## 2019-07-28 VITALS — BP 138/72 | HR 60 | Temp 98.3°F | Ht 61.0 in | Wt 227.0 lb

## 2019-07-28 DIAGNOSIS — J441 Chronic obstructive pulmonary disease with (acute) exacerbation: Secondary | ICD-10-CM

## 2019-07-28 DIAGNOSIS — J449 Chronic obstructive pulmonary disease, unspecified: Secondary | ICD-10-CM

## 2019-07-28 MED ORDER — LEVALBUTEROL HCL 0.31 MG/3ML IN NEBU: 1.0000 | INHALATION_SOLUTION | RESPIRATORY_TRACT | 6 refills | Status: DC | PRN

## 2019-07-28 MED ORDER — LEVALBUTEROL TARTRATE 45 MCG/ACT IN AERO: 2.0000 | INHALATION_SPRAY | Freq: Four times a day (QID) | RESPIRATORY_TRACT | 12 refills | Status: DC | PRN

## 2019-07-28 NOTE — Progress Notes (Signed)
Full PFT performed today. °

## 2019-07-28 NOTE — Assessment & Plan Note (Addendum)
-   Moderate obstruction with curvature on flow volume loop  - FEV1 1.24 (56%), ratio 78 - Continue Symbicort 160 two puffs twice daily (PA paper work started); Spiriva respimat 2.5 mct daily  - Change Albuterol to Levalbuterol hfa/nebulzier q 6 hours prn breakthrough shortness of breath/wheezing - Referral to DME company for new nebulizer machine  - Up to date with influenza vaccine and pneumococcal 23  - FU in 6 months with Dr. Loanne Drilling or sooner if needed

## 2019-07-28 NOTE — Patient Instructions (Addendum)
Pulmonary function testing is stable from last year  Recommendations: Continue Symbicort twice daily (process PA paper work) Continue Spiriva one daily Use Levalbuterol rescue inhaler or nebulizer every 6 hours as needed for breakthrough shortness of breath/wheezing   Referral: [facility] for nebulizer machine  Follow-up: 6 months with Dr. Loanne Drilling

## 2019-07-28 NOTE — Assessment & Plan Note (Signed)
-   PFTs with [facility] in 2019 showed positive hyper-reactive airway with decline in FEV1 by more than 20%

## 2019-07-28 NOTE — Progress Notes (Signed)
@Patient  ID: Whitney Chavez, female    DOB: [birth year], 63 y.o.   MRN: [id number]  Chief Complaint  Patient presents with  . Follow-up    PFT     Referring provider: Kathyrn Lass, MD  HPI: 63 year old female, former smoker (60 pack years). PMH significant for COPD-asthma overlap, airway hyperreactivity, sleep apnea, ESRD on HD, factor 5 leiden mutation (on anticoagulation), chronic pain syndrome, obesity. Patient of Dr. Loanne Drilling, seen for initial consult on 11/25/18. Maintained on Symbicort 160, started on Spiriva.   07/28/2019 Patient presents today for follow-up visit with PFTs. She feels Symbicort and Spiriva have been beneficial to her breathing and upper airway congestion. She is concerned with increased heart rate with Albuterol and would like to change to Levalbuterol. She was seen earlier this year by PCP for an exacerbation and improved with nebulized bronchodilator and steroid injection. She continues on HD. Working on weight loss, her goal is to lose 20 lbs before being considered for kidney transplant.   PFTs 07/28/2019 FVC 1.59 (55%), FEV1 1.24 (56%), ratio 78, normal DLCO Moderate obstruction with positive BD response. Curvature to flow volume loop.   12/29/17 PFTs with MCT- FVC 1.73 (59%), FEV1 1.30 (58%), ratio 75 Moderate obstruction with positive hyper-reactive airway/ FEV1 declined by more than 20%  Allergies  Allergen Reactions  . Estrogens Other (See Comments)    Blood clots  . Other Other (See Comments)    Unable to take birth control pills due to clotting disorder/fim  . Prednisone Other (See Comments)    Facial edema - has had this 2020 and had no difficulty "can take in small doses"  . Progesterone Other (See Comments)    Blood clots  . Tape Rash    Please use paper tape    Immunization History  Administered Date(s) Administered  . Influenza-Unspecified 06/22/2015, 06/25/2016, 06/30/2019  . Pneumococcal Conjugate-13 11/27/2014  . Pneumococcal  Polysaccharide-23 07/11/2016  . Zoster Recombinat (Shingrix) 04/11/2017, 08/17/2017    Past Medical History:  Diagnosis Date  . Asthma   . Axillary vein thrombosis ([place]) 08/16/2015  . Calciphylaxis   . Chronic kidney disease   . Colitis   . COPD (chronic obstructive pulmonary disease) ([place])   . Hypertension   . Lymphedema    legs  . MRSA (methicillin resistant staph aureus) culture positive   . Neuropathy   . Obstructive sleep apnea 03/29/2015  . OSA on CPAP   . Vision abnormalities     Tobacco History: Social History   Tobacco Use  Smoking Status Former Smoker  . Packs/day: 2.00  . Years: 30.00  . Pack years: 60.00  . Quit date: 11/25/1998  . Years since quitting: 20.6  Smokeless Tobacco Never Used   Counseling given: Not Answered   Outpatient Medications Prior to Visit  Medication Sig Dispense Refill  . acetaminophen (TYLENOL) 325 MG tablet Take 325 mg by mouth daily as needed for headache (pain).     Marland Kitchen amiodarone (PACERONE) 200 MG tablet Take 200 mg by mouth daily.    Marland Kitchen apixaban (ELIQUIS) 5 MG TABS tablet Take 1 tablet (5 mg total) by mouth 2 (two) times daily. 60 tablet 12  . atorvastatin (LIPITOR) 20 MG tablet Take 20 mg by mouth daily.     . B Complex-C-Folic Acid (RENAL VITAMIN PO) Take 1 tablet by mouth daily.    . budesonide-formoterol (SYMBICORT) 160-4.5 MCG/ACT inhaler Inhale into the lungs.    . carvedilol (COREG) 25 MG tablet Take by  mouth 2 (two) times a day.    . cetirizine (ZYRTEC) 10 MG tablet Take 10 mg by mouth daily.    . Darbepoetin Alfa-Albumin (ARANESP IJ) Inject as directed once a week. At dialysis center, dose varies.    . Etelcalcetide HCl (PARSABIV IV) Inject into the vein.    Marland Kitchen gabapentin (NEURONTIN) 100 MG capsule Take 1 capsule (100 mg total) by mouth 4 (four) times daily. 360 capsule 3  . lanthanum (FOSRENOL) 1000 MG chewable tablet Chew 250-500 mg by mouth See admin instructions. Chew 1 tablet (500 mg) by mouth three times daily with  meals, chew 1/4 tablet (250 mg) with snacks    . losartan (COZAAR) 50 MG tablet Take 50 mg by mouth daily.    . Melatonin 10 MG TABS Take 10-20 mg by mouth at bedtime as needed (sleep).     . nortriptyline (PAMELOR) 10 MG capsule Take 20 mg by mouth at bedtime.     Marland Kitchen omeprazole (PRILOSEC) 20 MG capsule Take 20 mg by mouth daily as needed (acid reflux). Rarely takes    . ondansetron (ZOFRAN-ODT) 4 MG disintegrating tablet Take 4 mg by mouth every 3 (three) days.    Marland Kitchen PRESCRIPTION MEDICATION Inhale into the lungs at bedtime. CPAP with nasal pillow    . rOPINIRole (REQUIP) 0.5 MG tablet Take 1-2 tablets in the morning po. 180 tablet 3  . rOPINIRole (REQUIP) 2 MG tablet Take 1 tablet (2 mg total) by mouth at bedtime. 90 tablet 3  . sulfaSALAzine (AZULFIDINE) 500 MG tablet Take 1,000 mg by mouth 2 (two) times daily.     . Tiotropium Bromide Monohydrate (SPIRIVA RESPIMAT IN) Inhale into the lungs.    . traMADol (ULTRAM) 50 MG tablet Take 50 mg by mouth 2 (two) times daily as needed (pain).     . triamcinolone cream (KENALOG) 0.1 % APPLY CREAM EXTERNALLY ONCE DAILY AS NEEDED FOR 30 DAYS    . albuterol (PROVENTIL HFA;VENTOLIN HFA) 108 (90 Base) MCG/ACT inhaler Inhale 2 puffs into the lungs every 6 (six) hours as needed for wheezing or shortness of breath. 1 Inhaler 2   No facility-administered medications prior to visit.    Review of Systems  Review of Systems  Constitutional: Negative.   Respiratory: Positive for cough and shortness of breath. Negative for wheezing.    Physical Exam  BP 138/72 (BP Location: Right Arm, Cuff Size: Normal)   Pulse 60   Temp 98.3 F (36.8 C) (Temporal)   Ht 5\' 1"  (1.549 m)   Wt 227 lb (103 kg)   SpO2 96%   BMI 42.89 kg/m  Physical Exam Constitutional:      Appearance: Normal appearance.  HENT:     Head: Normocephalic and atraumatic.     Mouth/Throat:     Mouth: Mucous membranes are moist.     Pharynx: Oropharynx is clear.  Cardiovascular:     Rate and  Rhythm: Normal rate and regular rhythm.  Pulmonary:     Effort: Pulmonary effort is normal.     Breath sounds: Normal breath sounds. No wheezing, rhonchi or rales.     Comments: CTA Skin:    General: Skin is warm and dry.  Neurological:     General: No focal deficit present.     Mental Status: She is alert and oriented to person, place, and time. Mental status is at baseline.  Psychiatric:        Mood and Affect: Mood normal.  Behavior: Behavior normal.        Thought Content: Thought content normal.        Judgment: Judgment normal.     Lab Results:  CBC    Component Value Date/Time   WBC 4.0 07/14/2019 0943   WBC 6.8 05/24/2018 2123   RBC 3.53 (L) 07/14/2019 0943   HGB 11.3 (L) 07/14/2019 0943   HGB 9.9 (L) 06/25/2017 1146   HCT 36.0 07/14/2019 0943   HCT 32.3 (L) 06/25/2017 1146   PLT 117 (L) 07/14/2019 0943   PLT 202 06/25/2017 1146   MCV 102.0 (H) 07/14/2019 0943   MCV 92 06/25/2017 1146   MCH 32.0 07/14/2019 0943   MCHC 31.4 07/14/2019 0943   RDW 15.2 07/14/2019 0943   RDW 16.4 (H) 06/25/2017 1146   LYMPHSABS 0.6 (L) 07/14/2019 0943   LYMPHSABS 1.2 06/25/2017 1146   MONOABS 0.5 07/14/2019 0943   EOSABS 0.2 07/14/2019 0943   EOSABS 0.2 06/25/2017 1146   BASOSABS 0.0 07/14/2019 0943   BASOSABS 0.0 06/25/2017 1146    BMET    Component Value Date/Time   NA 140 07/14/2019 0943   NA 144 06/25/2017 1146   K 5.1 07/14/2019 0943   K 4.1 06/25/2017 1146   CL 96 (L) 07/14/2019 0943   CL 103 06/25/2017 1146   CO2 30 07/14/2019 0943   CO2 31 06/25/2017 1146   GLUCOSE 84 07/14/2019 0943   GLUCOSE 119 (H) 06/25/2017 1146   BUN 33 (H) 07/14/2019 0943   BUN 27 (H) 06/25/2017 1146   CREATININE 5.05 (HH) 07/14/2019 0943   CREATININE 4.7 (HH) 06/25/2017 1146   CALCIUM 8.8 (L) 07/14/2019 0943   CALCIUM 9.7 06/25/2017 1146   GFRNONAA 8 (L) 07/14/2019 0943   GFRAA 10 (L) 07/14/2019 0943    BNP    Component Value Date/Time   BNP 434.6 (H) 10/23/2017 1058     ProBNP No results found for: PROBNP  Imaging: No results found.   Assessment & Plan:   COPD, moderate (HCC) - Moderate obstruction with curvature on flow volume loop  - FEV1 1.24 (56%), ratio 78 - Continue Symbicort 160 two puffs twice daily; Spiriva respimat 2.5 mct daily  - Change Albuterol to Levalbuterol hfa/nebulzier q 6 hours prn breakthrough shortness of breath/wheezing - Referral to DME company for new nebulizer machine  - Up to date with influenza vaccine and pneumococcal 23  - FU in 6 months with Dr. Loanne Drilling or sooner if needed   Airway hyperreactivity - PFTs with MCT in 2019 showed positive hyper-reactive airway with decline in FEV1 by more than 20%   Martyn Ehrich, NP 07/28/2019

## 2019-08-03 ENCOUNTER — Telehealth: Payer: Self-pay | Admitting: Neurology

## 2019-08-03 DIAGNOSIS — G4733 Obstructive sleep apnea (adult) (pediatric): Secondary | ICD-10-CM

## 2019-08-03 NOTE — Telephone Encounter (Signed)
I contacted the pt and advised we would place the order for a new cpap machine.  Pt has been scheduled for compliance f/u.

## 2019-08-03 NOTE — Telephone Encounter (Signed)
Patient called wanting to know if she could get a new cpap machine. Patient states she received the supplies but is needing a new machine as to hers is 63 years old and she would like to get one before the end of the year. Please follow up,  Patient states she goes through [facility].

## 2019-08-09 ENCOUNTER — Telehealth: Payer: Self-pay | Admitting: Primary Care

## 2019-08-09 MED ORDER — LEVALBUTEROL HCL 0.63 MG/3ML IN NEBU: 0.6300 mg | INHALATION_SOLUTION | RESPIRATORY_TRACT | 11 refills | Status: DC | PRN

## 2019-08-09 NOTE — Telephone Encounter (Signed)
Rx was fixed for pt's levalbuterol neb sol. Called and spoke with pt letting her know this had been done and pt verbalized understanding. Nothing further needed.

## 2019-08-15 NOTE — Telephone Encounter (Signed)
Faxed orders to fax below. Received fax confirmation.

## 2019-08-15 NOTE — Telephone Encounter (Signed)
Patient called to provide the fax number where the cpap machine orders could be sent directly to [facility] cpap department   [phone]

## 2019-08-16 NOTE — Addendum Note (Signed)
Addended by: Hope Pigeon on: 08/16/2019 04:32 PM   Modules accepted: Orders

## 2019-08-16 NOTE — Telephone Encounter (Signed)
I called pt to let her know we refaxed. She will follow up with [facility] to make sure they received fax. Nothing further needed.

## 2019-08-16 NOTE — Telephone Encounter (Signed)
Pt called stating that [facility] informed her that they have not receive the fax of the order. Pt states she would like to know if RN can fax it to [phone] and if this fax does not work then the pt states she will find another company to go through and call us with the company information.

## 2019-08-16 NOTE — Telephone Encounter (Signed)
[facility] to see if they have received referral. Spoke with Jeneen Rinks. They need order to specifically state pt needs new machine/supplies. I reprinted order and re-faxed to them at [phone].   I called pt to let her know info above. Advised it would take longer to get new machine if we switched DME companies. Reassured her that I confirmed fax# and received confirmation. Asked her to call them in the morning to confirm and to call us back if any further questions.

## 2019-08-16 NOTE — Telephone Encounter (Signed)
Re-faxed to number below. Received fax confirmation.

## 2019-08-16 NOTE — Telephone Encounter (Signed)
Patient called to report that [facility] has not received it and due to the trouble she would like for it to be sent to another supplier. Patient would like to use  [employer]  Phone#[phone]   Patient states she spoke to frances at the [place] facility and was informed that a new order would be needed along with the chart notes. Please follow up.

## 2019-08-17 ENCOUNTER — Other Ambulatory Visit: Payer: Self-pay

## 2019-08-17 MED ORDER — APIXABAN 5 MG PO TABS: 5.0000 mg | ORAL_TABLET | Freq: Two times a day (BID) | ORAL | 12 refills | Status: DC

## 2019-08-17 NOTE — Telephone Encounter (Signed)
Dr. Felecia Shelling I received the message from [facility], can you make changes and I can print addended note and then fax to them. Thank you!  "Good Afternoon, Thank you for choosing [facility] for your patient's healthcare needs! We will need the following to process the order for a CPAP machine: 1. Face to Face Notes: (PAP Replacement) The face-to-face evaluation (within 6 months) must document the beneficiary's continued use and benefit from CPAP therapy. It must detail why a new machine is needed. Has the machine exceeded its useful life (5 years)? Is it broken beyond repair? Please document this in the recent face-to-face visit. These are insurance requirements, we cannot provide service without it. Thanks, Cyndee Brightly"

## 2019-08-17 NOTE — Telephone Encounter (Signed)
I addended my July note which should qualify her for CPAP

## 2019-08-17 NOTE — Telephone Encounter (Signed)
Faxed addended office notes to [facility] at [phone] and [phone]. Received confirmation for both.

## 2019-08-18 ENCOUNTER — Telehealth: Payer: Self-pay | Admitting: *Deleted

## 2019-08-18 NOTE — Telephone Encounter (Signed)
Call received from patient to see if Eliquis assistance paper work is ready and available for pick up.  Pt notified per Burr Medico that paper work is available for pick up.  Pt states that she will be here tomorrow, 08/19/19 to pick up paper work for [employer] assistance.

## 2019-10-03 NOTE — Progress Notes (Addendum)
PATIENT: Whitney Chavez DOB: 1955-12-22  REASON FOR VISIT: follow up HISTORY FROM: patient  Chief Complaint  Patient presents with  . Follow-up    2 mon f/u. Alone. Rm 2. No new concerns at this time.      HISTORY OF PRESENT ILLNESS: Today 10/04/19 Whitney Chavez is a 65 y.o. female here today for follow up for OSA, insomnia, RLS and polyneuropathy. She reports that she is doing very well. She is on dialysis. She is trying to lose weight to be considered for transplant. They are adjusting her dry weight and she reports that sometimes her RLS and neuropathy worsen. She does take 0.5mg  and 2mg  ropinirole. She uses 0.5mg  and 100mg  gabapentin during the day as needed when on dialysis. This has helped significantly. She takes gabapentin 300mg  and 2mg  ropinirole at bedtime.   She recently received a new CPAP machine. She loves it. She is using CPAP nightly. She feels better rested with CPAP therapy. Compliance report reveals nightly use. Residual AHI 1.3 on 11cmH20. No leaks noted.   HISTORY: (copied from Dr Garth Bigness note on 04/05/2019)  Whitney Chavez is a 64 year old woman with Obstructive sleep apnea , insomnia, restless leg syndrome and polyneuropathy.      Update 04/05/2019: She feels gabapentin and ropinirole is helping her RLS.    The gabapentin also helps her neuropathic pain in her feet .   The tramadol started for another indication also helps.   She also developed lymphedema and sees a specialist at [facility].    She hsa lost 17 pounds of fluid  She has ESRD on HD and developed calciphylaxis with lumps of calcium in her skin.  Now treated with sodium thiopental after her HD treatments.    Due to pain she has been placed on tramadol for the calcitosis.    She is on tramadol 50 mg twice a day for the pain.   She tolerates it very well.  She has OSA and is doing well.   She sleeps through the night and feels refresehd when she wakes up.    She is on +11 cm H2O pressure.   She wears it for  naps if she takes one at home.    She does doze off at ESRD (can't wear CPAP there) but not with other activities.     Update 04/01/2017: She has had more RLS and neuropathy/.dysesthesia symptoms.    RLS is much worse with dialysis.       She is currently taking gabapentin 100 mg x 3 daily (one at dialysis)   She is on 1/2 mg ropinirole during the day and 2 mg at night.     She has calciphylaxis and has a lot of calcification issues with subcutaneous large nodules.   Also has Dercum's disease.   These issues have caused the lesions 'eaten' throug the skin and she has abdominal and hip lesions.   She is being treated for these issues.   She does wound care there.     She also sees pain management and is on tramadol.     Her severe OSA does well and she uses CPAP daily and can't sleep well without it.     RLS bothers her just mildly at .night.   Insomnia is variable, bad last night  She needs new orthotics for her shoes  From 03/31/2017: She is having more difficulty getting around her house to perform activities of daily living and would benefit from a power  vehicle such as a scooter.  Restless leg syndrome/polyneuropathy:  She gets RLS, worse with HD.   A combination of gabapentin with ropinirole have greatly helped.   Her RLS was severe and occurred day and night, worse after each HD session.   She feels she sleeps better with the RLS med's.     She has polyneuropathy with numbness up to her ankles and has been stable the past year. . A nerve conduction study/EMG February 2015 showed a length dependent sensory motor axonal polyneuropathy with active features in the lower leg history she had a superimposed mild-to-moderate right carpal tunnel syndrome.   She has special orthotics that help her foot pain some.    Gait:   Due to the polyneuropathy, she has a gait disturbance and has occasional falls. She uses a cane but has difficulty walking longer distances   OSA:  She is sleeping well with  CPAP  +10 cm every night.   A PSG study February 2015 showed an AHI equals 44.7 with very poor sleep efficiency. Due to the poor sleep efficiency she had AutoPap as an outpatient to avoid another night in the laboratory.   She does not note much sleepiness but may lay nod off for a few minutes around 3 pm and then does well the rest of the day.     Insomnia:  She is sleeping much better on the combination of CPAP, gabapentin and ropinirole.  She has sleep maintenance more than sleep onset insomnia. She takes 400 mg gabapentin with benefit. Insomnia is worse night after hemodialysis.  Other: She is on hemodialysis for CRF due to vitamin D toxicity. She has had anemia associated with ESRD.    She has painful lumps in her skin and subcutaneous tissue felt due to the Aranesp/Epogen.    REVIEW OF SYSTEMS: Out of a complete 14 system review of symptoms, the patient complains only of the following symptoms, restless legs, neuropathy and all other reviewed systems are negative.  Epworth sleepiness scale: 7 Fatigue severity scale: 26  ALLERGIES: Allergies  Allergen Reactions  . Estrogens Other (See Comments)    Blood clots  . Other Other (See Comments)    Unable to take birth control pills due to clotting disorder/fim  . Prednisone Other (See Comments)    Facial edema - has had this 2020 and had no difficulty "can take in small doses"  . Progesterone Other (See Comments)    Blood clots  . Propofol Other (See Comments)    Legs started thrashing and skin tears.  . Tape Rash    Please use paper tape    HOME MEDICATIONS: Outpatient Medications Prior to Visit  Medication Sig Dispense Refill  . acetaminophen (TYLENOL) 325 MG tablet Take 325 mg by mouth daily as needed for headache (pain).     Marland Kitchen amiodarone (PACERONE) 200 MG tablet Take 200 mg by mouth daily.    Marland Kitchen apixaban (ELIQUIS) 5 MG TABS tablet Take 1 tablet (5 mg total) by mouth 2 (two) times daily. 60 tablet 12  . atorvastatin (LIPITOR)  20 MG tablet Take 20 mg by mouth daily.     . B Complex-C-Folic Acid (RENAL VITAMIN PO) Take 1 tablet by mouth daily.    . budesonide-formoterol (SYMBICORT) 160-4.5 MCG/ACT inhaler Inhale into the lungs.    . carvedilol (COREG) 25 MG tablet Take by mouth 2 (two) times a day.    . cetirizine (ZYRTEC) 10 MG tablet Take 10 mg by mouth daily.    Marland Kitchen  Darbepoetin Alfa-Albumin (ARANESP IJ) Inject as directed once a week. At dialysis center, dose varies.    . Etelcalcetide HCl (PARSABIV IV) Inject into the vein.    Marland Kitchen gabapentin (NEURONTIN) 100 MG capsule Take 1 capsule (100 mg total) by mouth 4 (four) times daily. (Patient taking differently: Take 100 mg by mouth 4 (four) times daily. 1 tablet before Dialysis on Monday, Wednesday and Friday.) 360 capsule 3  . lanthanum (FOSRENOL) 1000 MG chewable tablet Chew 250-500 mg by mouth See admin instructions. Chew 1 tablet (500 mg) by mouth three times daily with meals, chew 1/4 tablet (250 mg) with snacks    . levalbuterol (XOPENEX HFA) 45 MCG/ACT inhaler Inhale 2 puffs into the lungs every 6 (six) hours as needed for wheezing. 1 Inhaler 12  . levalbuterol (XOPENEX) 0.63 MG/3ML nebulizer solution Take 3 mLs (0.63 mg total) by nebulization every 4 (four) hours as needed for wheezing or shortness of breath. 75 mL 11  . losartan (COZAAR) 50 MG tablet Take 50 mg by mouth daily.    . Melatonin 10 MG TABS Take 10-20 mg by mouth at bedtime as needed (sleep).     . nortriptyline (PAMELOR) 10 MG capsule Take 20 mg by mouth at bedtime.     Marland Kitchen omeprazole (PRILOSEC) 20 MG capsule Take 20 mg by mouth daily as needed (acid reflux). Rarely takes    . ondansetron (ZOFRAN-ODT) 4 MG disintegrating tablet Take 4 mg by mouth every 3 (three) days.    Marland Kitchen PRESCRIPTION MEDICATION Inhale into the lungs at bedtime. CPAP with nasal pillow    . rOPINIRole (REQUIP) 0.5 MG tablet Take 1-2 tablets in the morning po. (Patient taking differently: Take 1-2 tablets in the morning po. 1 tablet before  Dialysis on Monday, Wednesday and Friday.) 180 tablet 3  . rOPINIRole (REQUIP) 2 MG tablet Take 1 tablet (2 mg total) by mouth at bedtime. 90 tablet 3  . sulfaSALAzine (AZULFIDINE) 500 MG tablet Take 1,000 mg by mouth 2 (two) times daily.     . Tiotropium Bromide Monohydrate (SPIRIVA RESPIMAT IN) Inhale into the lungs.    . traMADol (ULTRAM) 50 MG tablet Take 50 mg by mouth 2 (two) times daily as needed (pain).     . triamcinolone cream (KENALOG) 0.1 % APPLY CREAM EXTERNALLY ONCE DAILY AS NEEDED FOR 30 DAYS    . losartan (COZAAR) 25 MG tablet Take 50 mg by mouth daily.     No facility-administered medications prior to visit.    PAST MEDICAL HISTORY: Past Medical History:  Diagnosis Date  . Asthma   . Axillary vein thrombosis ([place]) 08/16/2015  . Calciphylaxis   . Chronic kidney disease   . Colitis   . COPD (chronic obstructive pulmonary disease) ([place])   . Hypertension   . Lymphedema    legs  . MRSA (methicillin resistant staph aureus) culture positive   . Neuropathy   . Obstructive sleep apnea 03/29/2015  . OSA on CPAP   . Vision abnormalities     PAST SURGICAL HISTORY: Past Surgical History:  Procedure Laterality Date  . APPLICATION OF WOUND VAC    . AV FISTULA PLACEMENT Left   . lumbar decompression fusion    . TONSILLECTOMY      FAMILY HISTORY: Family History  Problem Relation Age of Onset  . Diabetes Mother   . Congestive Heart Failure Mother   . COPD Mother   . Stroke Mother   . Neuropathy Mother   . Heart disease Father   .  Stroke Father   . Cancer Brother   . Diabetes Brother   . Diabetes Brother   . Alcohol abuse Brother     SOCIAL HISTORY: Social History   Socioeconomic History  . Marital status: Single    Spouse name: Not on file  . Number of children: Not on file  . Years of education: Not on file  . Highest education level: Not on file  Occupational History  . Not on file  Tobacco Use  . Smoking status: Former Smoker    Packs/day: 2.00     Years: 30.00    Pack years: 60.00    Quit date: 11/25/1998    Years since quitting: 20.8  . Smokeless tobacco: Never Used  Substance and Sexual Activity  . Alcohol use: No    Alcohol/week: 0.0 standard drinks  . Drug use: No  . Sexual activity: Not on file  Other Topics Concern  . Not on file  Social History Narrative  . Not on file   Social Determinants of Health   Financial Resource Strain:   . Difficulty of Paying Living Expenses: Not on file  Food Insecurity:   . Worried About Charity fundraiser in the Last Year: Not on file  . Ran Out of Food in the Last Year: Not on file  Transportation Needs:   . Lack of Transportation (Medical): Not on file  . Lack of Transportation (Non-Medical): Not on file  Physical Activity:   . Days of Exercise per Week: Not on file  . Minutes of Exercise per Session: Not on file  Stress:   . Feeling of Stress : Not on file  Social Connections:   . Frequency of Communication with Friends and Family: Not on file  . Frequency of Social Gatherings with Friends and Family: Not on file  . Attends Religious Services: Not on file  . Active Member of Clubs or Organizations: Not on file  . Attends Archivist Meetings: Not on file  . Marital Status: Not on file  Intimate Partner Violence:   . Fear of Current or Ex-Partner: Not on file  . Emotionally Abused: Not on file  . Physically Abused: Not on file  . Sexually Abused: Not on file      PHYSICAL EXAM  Vitals:   10/04/19 1043  BP: 114/60  Pulse: (!) 53  Temp: 97.6 F (36.4 C)  TempSrc: Oral  Weight: 227 lb 6.4 oz (103.1 kg)  Height: 5\' 1"  (1.549 m)   Body mass index is 42.97 kg/m.  Generalized: Well developed, in no acute distress  Cardiology: normal rate and rhythm, no murmur noted Respiratory: clear to auscultation bilaterally  Neurological examination  Mentation: Alert oriented to time, place, history taking. Follows all commands speech and language fluent Cranial  nerve II-XII: Pupils were equal round reactive to light. Extraocular movements were full, visual field were full . Motor: The motor testing reveals 5 over 5 strength of all 4 extremities. Good symmetric motor tone is noted throughout.  Gait and station: Gait is normal.   DIAGNOSTIC DATA (LABS, IMAGING, TESTING) - I reviewed patient records, labs, notes, testing and imaging myself where available.  No flowsheet data found.   Lab Results  Component Value Date   WBC 4.0 07/14/2019   HGB 11.3 (L) 07/14/2019   HCT 36.0 07/14/2019   MCV 102.0 (H) 07/14/2019   PLT 117 (L) 07/14/2019      Component Value Date/Time   NA 140 07/14/2019 0943  NA 144 06/25/2017 1146   K 5.1 07/14/2019 0943   K 4.1 06/25/2017 1146   CL 96 (L) 07/14/2019 0943   CL 103 06/25/2017 1146   CO2 30 07/14/2019 0943   CO2 31 06/25/2017 1146   GLUCOSE 84 07/14/2019 0943   GLUCOSE 119 (H) 06/25/2017 1146   BUN 33 (H) 07/14/2019 0943   BUN 27 (H) 06/25/2017 1146   CREATININE 5.05 (HH) 07/14/2019 0943   CREATININE 4.7 (HH) 06/25/2017 1146   CALCIUM 8.8 (L) 07/14/2019 0943   CALCIUM 9.7 06/25/2017 1146   PROT 6.7 07/14/2019 0943   PROT 7.0 06/25/2017 1146   ALBUMIN 4.6 07/14/2019 0943   ALBUMIN 3.6 06/25/2017 1146   AST 19 07/14/2019 0943   ALT 7 07/14/2019 0943   ALT 14 06/25/2017 1146   ALKPHOS 64 07/14/2019 0943   ALKPHOS 60 06/25/2017 1146   BILITOT 0.8 07/14/2019 0943   GFRNONAA 8 (L) 07/14/2019 0943   GFRAA 10 (L) 07/14/2019 0943   No results found for: CHOL, HDL, LDLCALC, LDLDIRECT, TRIG, CHOLHDL No results found for: HGBA1C No results found for: VITAMINB12 No results found for: TSH     ASSESSMENT AND PLAN 64 y.o. year old female  has a past medical history of Asthma, Axillary vein thrombosis ([facility]) (08/16/2015), Calciphylaxis, Chronic kidney disease, Colitis, COPD (chronic obstructive pulmonary disease) ([place]), Hypertension, Lymphedema, MRSA (methicillin resistant staph aureus) culture positive,  Neuropathy, Obstructive sleep apnea (03/29/2015), OSA on CPAP, and Vision abnormalities. here with     ICD-10-CM   1. Obstructive sleep apnea  G47.33   2. Polyneuropathy  G62.9   3. Restless leg  G25.81     Onalee is doing very well.  She feels that as needed dosing of ropinirole and gabapentin have been very helpful on dialysis days.  We will continue 0.5 mg of ropinirole and 100 mg of gabapentin as needed on dialysis days.  She will continue ropinirole 2 mg and gabapentin 300 mg at bedtime.  Compliance report reveals excellent compliance.  She is doing very well with her new CPAP machine.  She was encouraged to continue using CPAP nightly and for greater than 4 hours each night.  She will continue close follow-up with primary care and nephrology as directed.  She will follow-up with [place] in 6 months, sooner if needed.  She verbalizes understanding and agreement with this plan.   No orders of the defined types were placed in this encounter.    No orders of the defined types were placed in this encounter.      Debbora Presto, FNP-C 10/04/2019, 11:18 AM [facility] [address] [place], [place] [address] [phone]

## 2019-10-04 ENCOUNTER — Ambulatory Visit: Payer: Self-pay | Admitting: Neurology

## 2019-10-04 ENCOUNTER — Encounter: Payer: Self-pay | Admitting: Family Medicine

## 2019-10-04 ENCOUNTER — Other Ambulatory Visit: Payer: Self-pay

## 2019-10-04 ENCOUNTER — Ambulatory Visit (INDEPENDENT_AMBULATORY_CARE_PROVIDER_SITE_OTHER): Payer: Medicare Other | Admitting: Family Medicine

## 2019-10-04 VITALS — BP 114/60 | HR 53 | Temp 97.6°F | Ht 61.0 in | Wt 227.4 lb

## 2019-10-04 DIAGNOSIS — G2581 Restless legs syndrome: Secondary | ICD-10-CM | POA: Diagnosis not present

## 2019-10-04 DIAGNOSIS — G4733 Obstructive sleep apnea (adult) (pediatric): Secondary | ICD-10-CM

## 2019-10-04 DIAGNOSIS — G629 Polyneuropathy, unspecified: Secondary | ICD-10-CM

## 2019-10-04 NOTE — Patient Instructions (Signed)
We will continue ropinorole 0.5mg  and gabapentin 100mg  as needed during dialysis and ropinirole 2mg  and gabapentin 300mg  at night   We will continue CPAP therapy. Compliance looks great! Continue usage nightly and greater than 4 hours each night   Follow up in 6 months, sooner if needed   Peripheral Neuropathy Peripheral neuropathy is a type of nerve damage. It affects nerves that carry signals between the spinal cord and the arms, legs, and the rest of the body (peripheral nerves). It does not affect nerves in the spinal cord or brain. In peripheral neuropathy, one nerve or a group of nerves may be damaged. Peripheral neuropathy is a broad category that includes many specific nerve disorders, like diabetic neuropathy, hereditary neuropathy, and carpal tunnel syndrome. What are the causes? This condition may be caused by:  Diabetes. This is the most common cause of peripheral neuropathy.  Nerve injury.  Pressure or stress on a nerve that lasts a long time.  Lack (deficiency) of B vitamins. This can result from alcoholism, poor diet, or a restricted diet.  Infections.  Autoimmune diseases, such as rheumatoid arthritis and systemic lupus erythematosus.  Nerve diseases that are passed from parent to child (inherited).  Some medicines, such as cancer medicines (chemotherapy).  Poisonous (toxic) substances, such as lead and mercury.  Too little blood flowing to the legs.  Kidney disease.  Thyroid disease. In some cases, the cause of this condition is not known. What are the signs or symptoms? Symptoms of this condition depend on which of your nerves is damaged. Common symptoms include:  Loss of feeling (numbness) in the feet, hands, or both.  Tingling in the feet, hands, or both.  Burning pain.  Very sensitive skin.  Weakness.  Not being able to move a part of the body (paralysis).  Muscle twitching.  Clumsiness or poor coordination.  Loss of balance.  Not being  able to control your bladder.  Feeling dizzy.  Sexual problems. How is this diagnosed? Diagnosing and finding the cause of peripheral neuropathy can be difficult. Your health care provider will take your medical history and do a physical exam. A neurological exam will also be done. This involves checking things that are affected by your brain, spinal cord, and nerves (nervous system). For example, your health care provider will check your reflexes, how you move, and what you can feel. You may have other tests, such as:  Blood tests.  Electromyogram (EMG) and nerve conduction tests. These tests check nerve function and how well the nerves are controlling the muscles.  Imaging tests, such as CT scans or MRI to rule out other causes of your symptoms.  Removing a small piece of nerve to be examined in a lab (nerve biopsy). This is rare.  Removing and examining a small amount of the fluid that surrounds the brain and spinal cord (lumbar puncture). This is rare. How is this treated? Treatment for this condition may involve:  Treating the underlying cause of the neuropathy, such as diabetes, kidney disease, or vitamin deficiencies.  Stopping medicines that can cause neuropathy, such as chemotherapy.  Medicine to relieve pain. Medicines may include: ? Prescription or over-the-counter pain medicine. ? Antiseizure medicine. ? Antidepressants. ? Pain-relieving patches that are applied to painful areas of skin.  Surgery to relieve pressure on a nerve or to destroy a nerve that is causing pain.  Physical therapy to help improve movement and balance.  Devices to help you move around (assistive devices). Follow these instructions at home:  Medicines  Take over-the-counter and prescription medicines only as told by your health care provider. Do not take any other medicines without first asking your health care provider.  Do not drive or use heavy machinery while taking prescription pain  medicine. Lifestyle   Do not use any products that contain nicotine or tobacco, such as cigarettes and e-cigarettes. Smoking keeps blood from reaching damaged nerves. If you need help quitting, ask your health care provider.  Avoid or limit alcohol. Too much alcohol can cause a vitamin B deficiency, and vitamin B is needed for healthy nerves.  Eat a healthy diet. This includes: ? Eating foods that are high in fiber, such as fresh fruits and vegetables, whole grains, and beans. ? Limiting foods that are high in fat and processed sugars, such as fried or sweet foods. General instructions   If you have diabetes, work closely with your health care provider to keep your blood sugar under control.  If you have numbness in your feet: ? Check every day for signs of injury or infection. Watch for redness, warmth, and swelling. ? Wear padded socks and comfortable shoes. These help protect your feet.  Develop a good support system. Living with peripheral neuropathy can be stressful. Consider talking with a mental health specialist or joining a support group.  Use assistive devices and attend physical therapy as told by your health care provider. This may include using a walker or a cane.  Keep all follow-up visits as told by your health care provider. This is important. Contact a health care provider if:  You have new signs or symptoms of peripheral neuropathy.  You are struggling emotionally from dealing with peripheral neuropathy.  Your pain is not well-controlled. Get help right away if:  You have an injury or infection that is not healing normally.  You develop new weakness in an arm or leg.  You fall frequently. Summary  Peripheral neuropathy is when the nerves in the arms, or legs are damaged, resulting in numbness, weakness, or pain.  There are many causes of peripheral neuropathy, including diabetes, pinched nerves, vitamin deficiencies, autoimmune disease, and hereditary  conditions.  Diagnosing and finding the cause of peripheral neuropathy can be difficult. Your health care provider will take your medical history, do a physical exam, and do tests, including blood tests and nerve function tests.  Treatment involves treating the underlying cause of the neuropathy and taking medicines to help control pain. Physical therapy and assistive devices may also help. This information is not intended to replace advice given to you by your health care provider. Make sure you discuss any questions you have with your health care provider. Document Revised: 08/14/2017 Document Reviewed: 11/10/2016 Elsevier Patient Education  [place].    Sleep Apnea Sleep apnea affects breathing during sleep. It causes breathing to stop for a short time or to become shallow. It can also increase the risk of:  Heart attack.  Stroke.  Being very overweight (obese).  Diabetes.  Heart failure.  Irregular heartbeat. The goal of treatment is to help you breathe normally again. What are the causes? There are three kinds of sleep apnea:  Obstructive sleep apnea. This is caused by a blocked or collapsed airway.  Central sleep apnea. This happens when the brain does not send the right signals to the muscles that control breathing.  Mixed sleep apnea. This is a combination of obstructive and central sleep apnea. The most common cause of this condition is a collapsed or blocked  airway. This can happen if:  Your throat muscles are too relaxed.  Your tongue and tonsils are too large.  You are overweight.  Your airway is too small. What increases the risk?  Being overweight.  Smoking.  Having a small airway.  Being older.  Being female.  Drinking alcohol.  Taking medicines to calm yourself (sedatives or tranquilizers).  Having family members with the condition. What are the signs or symptoms?  Trouble staying asleep.  Being sleepy or tired during the  day.  Getting angry a lot.  Loud snoring.  Headaches in the morning.  Not being able to focus your mind (concentrate).  Forgetting things.  Less interest in sex.  Mood swings.  Personality changes.  Feelings of sadness (depression).  Waking up a lot during the night to pee (urinate).  Dry mouth.  Sore throat. How is this diagnosed?  Your medical history.  A physical exam.  A test that is done when you are sleeping (sleep study). The test is most often done in a sleep lab but may also be done at home. How is this treated?   Sleeping on your side.  Using a medicine to get rid of mucus in your nose (decongestant).  Avoiding the use of alcohol, medicines to help you relax, or certain pain medicines (narcotics).  Losing weight, if needed.  Changing your diet.  Not smoking.  Using a machine to open your airway while you sleep, such as: ? An oral appliance. This is a mouthpiece that shifts your lower jaw forward. ? A CPAP device. This device blows air through a mask when you breathe out (exhale). ? An EPAP device. This has valves that you put in each nostril. ? A BPAP device. This device blows air through a mask when you breathe in (inhale) and breathe out.  Having surgery if other treatments do not work. It is important to get treatment for sleep apnea. Without treatment, it can lead to:  High blood pressure.  Coronary artery disease.  In men, not being able to have an erection (impotence).  Reduced thinking ability. Follow these instructions at home: Lifestyle  Make changes that your doctor recommends.  Eat a healthy diet.  Lose weight if needed.  Avoid alcohol, medicines to help you relax, and some pain medicines.  Do not use any products that contain nicotine or tobacco, such as cigarettes, e-cigarettes, and chewing tobacco. If you need help quitting, ask your doctor. General instructions  Take over-the-counter and prescription medicines only  as told by your doctor.  If you were given a machine to use while you sleep, use it only as told by your doctor.  If you are having surgery, make sure to tell your doctor you have sleep apnea. You may need to bring your device with you.  Keep all follow-up visits as told by your doctor. This is important. Contact a doctor if:  The machine that you were given to use during sleep bothers you or does not seem to be working.  You do not get better.  You get worse. Get help right away if:  Your chest hurts.  You have trouble breathing in enough air.  You have an uncomfortable feeling in your back, arms, or stomach.  You have trouble talking.  One side of your body feels weak.  A part of your face is hanging down. These symptoms may be an emergency. Do not wait to see if the symptoms will go away. Get medical help right  away. Call your local emergency services (911 in the [place] Do not drive yourself to the hospital. Summary  This condition affects breathing during sleep.  The most common cause is a collapsed or blocked airway.  The goal of treatment is to help you breathe normally while you sleep. This information is not intended to replace advice given to you by your health care provider. Make sure you discuss any questions you have with your health care provider. Document Revised: 06/18/2018 Document Reviewed: 04/27/2018 Elsevier Patient Education  [place].

## 2019-10-04 NOTE — Progress Notes (Signed)
I have read the note, and I agree with the clinical assessment and plan.  Cecilie Heidel A. Elizah Lydon, MD, PhD, FAAN Certified in Neurology, Clinical Neurophysiology, Sleep Medicine, Pain Medicine and Neuroimaging  [facility] [address], [address] [place], [place] [address] [phone]  

## 2019-10-07 ENCOUNTER — Telehealth: Payer: Self-pay | Admitting: Pulmonary Disease

## 2019-10-07 MED ORDER — BUDESONIDE-FORMOTEROL FUMARATE 160-4.5 MCG/ACT IN AERO: 2.0000 | INHALATION_SPRAY | Freq: Two times a day (BID) | RESPIRATORY_TRACT | 0 refills | Status: DC

## 2019-10-07 NOTE — Telephone Encounter (Signed)
Spoke with pt, advised her that I sent in the symbicort to [facility]. She states she would print off an application for financial assistance through [employer] to get help with Symbicort. SHe is going to drop off the papers here when she fills out her portion of the form. Nothing further is needed at this time.

## 2019-11-29 ENCOUNTER — Other Ambulatory Visit (HOSPITAL_COMMUNITY)
Admission: RE | Admit: 2019-11-29 | Discharge: 2019-11-29 | Disposition: A | Discharge status: Home / Self Care | Payer: Medicare Other | Source: Ambulatory Visit | Attending: Family Medicine | Admitting: Family Medicine

## 2019-11-29 DIAGNOSIS — Z124 Encounter for screening for malignant neoplasm of cervix: Secondary | ICD-10-CM | POA: Insufficient documentation

## 2019-11-29 DIAGNOSIS — Z1151 Encounter for screening for human papillomavirus (HPV): Secondary | ICD-10-CM | POA: Diagnosis not present

## 2019-12-02 LAB — CYTOLOGY - PAP
Comment: NEGATIVE
Diagnosis: NEGATIVE
High risk HPV: NEGATIVE

## 2019-12-05 ENCOUNTER — Telehealth: Payer: Self-pay | Admitting: *Deleted

## 2019-12-05 NOTE — Telephone Encounter (Signed)
Called, LVM for pt to call office.   We received fax from [facility] that gabapentin #360/90 days is not covered, needs PA for continued coverage.  After reviewing last OV note, it appears she may not be taking this much. Wanted to verify how she is taking gabapentin prior to submitted PA.

## 2019-12-05 NOTE — Telephone Encounter (Signed)
Voicemail left at 2:42p:  Returning call to [facility]

## 2019-12-05 NOTE — Telephone Encounter (Signed)
Called pt back. She is taking gabapentin 100mg  cap as follows: 100mg  in afternoon and 300mg  at bedtime On dialysis days: 100mg  in the am and afternoon, 200mg  at bedtime  She varies between taking 400-500mg  per day. Depends on the day. Advised we will try and do QL via insurance to see if we can get it approved. We will call back if anything further needed.

## 2019-12-06 NOTE — Telephone Encounter (Signed)
Submitted QL via [employer] on CMM. Key: [id number]. Waiting on determination from [facility].

## 2019-12-06 NOTE — Telephone Encounter (Signed)
PA/QL approved 09/16/2019 - 09/14/2020.

## 2019-12-24 ENCOUNTER — Other Ambulatory Visit: Payer: Self-pay | Admitting: Neurology

## 2020-01-12 ENCOUNTER — Encounter (INDEPENDENT_AMBULATORY_CARE_PROVIDER_SITE_OTHER): Payer: Self-pay

## 2020-01-12 ENCOUNTER — Other Ambulatory Visit: Payer: Self-pay

## 2020-01-12 ENCOUNTER — Encounter: Payer: Self-pay | Admitting: Hematology & Oncology

## 2020-01-12 ENCOUNTER — Telehealth: Payer: Self-pay | Admitting: Hematology & Oncology

## 2020-01-12 ENCOUNTER — Inpatient Hospital Stay: Payer: Medicare Other

## 2020-01-12 ENCOUNTER — Inpatient Hospital Stay: Payer: Medicare Other | Attending: Hematology & Oncology | Admitting: Hematology & Oncology

## 2020-01-12 ENCOUNTER — Telehealth: Payer: Self-pay | Admitting: *Deleted

## 2020-01-12 VITALS — BP 78/48 | HR 68 | Temp 97.6°F | Resp 20 | Wt 225.0 lb

## 2020-01-12 DIAGNOSIS — I4891 Unspecified atrial fibrillation: Secondary | ICD-10-CM | POA: Insufficient documentation

## 2020-01-12 DIAGNOSIS — Z992 Dependence on renal dialysis: Secondary | ICD-10-CM | POA: Diagnosis not present

## 2020-01-12 DIAGNOSIS — Z7901 Long term (current) use of anticoagulants: Secondary | ICD-10-CM | POA: Insufficient documentation

## 2020-01-12 DIAGNOSIS — Z7951 Long term (current) use of inhaled steroids: Secondary | ICD-10-CM | POA: Insufficient documentation

## 2020-01-12 DIAGNOSIS — D6851 Activated protein C resistance: Secondary | ICD-10-CM

## 2020-01-12 DIAGNOSIS — D649 Anemia, unspecified: Secondary | ICD-10-CM

## 2020-01-12 DIAGNOSIS — I890 Lymphedema, not elsewhere classified: Secondary | ICD-10-CM | POA: Diagnosis not present

## 2020-01-12 DIAGNOSIS — Z79899 Other long term (current) drug therapy: Secondary | ICD-10-CM | POA: Diagnosis not present

## 2020-01-12 LAB — IRON AND TIBC
Iron: 172 ug/dL — ABNORMAL HIGH (ref 41–142)
Saturation Ratios: 99 % — ABNORMAL HIGH (ref 21–57)
TIBC: 174 ug/dL — ABNORMAL LOW (ref 236–444)
UIBC: 2 ug/dL — ABNORMAL LOW (ref 120–384)

## 2020-01-12 LAB — CBC WITH DIFFERENTIAL (CANCER CENTER ONLY)
Abs Immature Granulocytes: 0.05 10*3/uL (ref 0.00–0.07)
Basophils Absolute: 0 10*3/uL (ref 0.0–0.1)
Basophils Relative: 1 %
Eosinophils Absolute: 0.5 10*3/uL (ref 0.0–0.5)
Eosinophils Relative: 10 %
HCT: 28.4 % — ABNORMAL LOW (ref 36.0–46.0)
Hemoglobin: 9 g/dL — ABNORMAL LOW (ref 12.0–15.0)
Immature Granulocytes: 1 %
Lymphocytes Relative: 20 %
Lymphs Abs: 1 10*3/uL (ref 0.7–4.0)
MCH: 32.4 pg (ref 26.0–34.0)
MCHC: 31.7 g/dL (ref 30.0–36.0)
MCV: 102.2 fL — ABNORMAL HIGH (ref 80.0–100.0)
Monocytes Absolute: 0.6 10*3/uL (ref 0.1–1.0)
Monocytes Relative: 11 %
Neutro Abs: 2.8 10*3/uL (ref 1.7–7.7)
Neutrophils Relative %: 57 %
Platelet Count: 107 10*3/uL — ABNORMAL LOW (ref 150–400)
RBC: 2.78 MIL/uL — ABNORMAL LOW (ref 3.87–5.11)
RDW: 17.7 % — ABNORMAL HIGH (ref 11.5–15.5)
WBC Count: 5 10*3/uL (ref 4.0–10.5)
nRBC: 0.8 % — ABNORMAL HIGH (ref 0.0–0.2)

## 2020-01-12 LAB — CMP (CANCER CENTER ONLY)
ALT: 8 U/L (ref 0–44)
AST: 19 U/L (ref 15–41)
Albumin: 4.2 g/dL (ref 3.5–5.0)
Alkaline Phosphatase: 72 U/L (ref 38–126)
Anion gap: 15 (ref 5–15)
BUN: 29 mg/dL — ABNORMAL HIGH (ref 8–23)
CO2: 28 mmol/L (ref 22–32)
Calcium: 8.8 mg/dL — ABNORMAL LOW (ref 8.9–10.3)
Chloride: 96 mmol/L — ABNORMAL LOW (ref 98–111)
Creatinine: 4.96 mg/dL (ref 0.44–1.00)
GFR, Est AFR Am: 10 mL/min — ABNORMAL LOW (ref >60)
GFR, Estimated: 9 mL/min — ABNORMAL LOW (ref >60)
Glucose, Bld: 92 mg/dL (ref 70–99)
Potassium: 5 mmol/L (ref 3.5–5.1)
Sodium: 139 mmol/L (ref 135–145)
Total Bilirubin: 0.8 mg/dL (ref 0.3–1.2)
Total Protein: 6.8 g/dL (ref 6.5–8.1)

## 2020-01-12 LAB — FERRITIN: Ferritin: 1599 ng/mL — ABNORMAL HIGH (ref 11–307)

## 2020-01-12 NOTE — Telephone Encounter (Signed)
Panic Creatinine 4.96  Dr Marin Olp notified.  No orders received.

## 2020-01-12 NOTE — Progress Notes (Signed)
Hematology and Oncology Follow Up Visit  Whitney Chavez [id number] 1956-08-23 64 y.o. 01/12/2020   Principle Diagnosis:   Recurrent thromboembolic disease  Heterozygous factor V Leiden mutation  Prothrombin II gene mutation  Calciphylaxis  Current Therapy:    Eliquis 5 mg po BID     Interim History:  Whitney Chavez is back for follow-up.  We last saw Whitney Chavez back in October.  Whitney Chavez still is managing okay.  The 1 issues at Whitney Chavez hemoglobin has been down a little bit.  I think Whitney Chavez is getting Aranesp with dialysis.  Whitney Chavez has had no obvious bleeding.  On the left inner thigh, Whitney Chavez does have an ecchymoses.  I will know there may be a little bit of superficial thrombophlebitis at that area.  Whitney Chavez is on Eliquis.  Whitney Chavez is doing well with the Eliquis.  Whitney Chavez does have the calciphylaxis.  The seem to be a little bit more irritable when Whitney Chavez had Whitney Chavez coronavirus vaccine.  Whitney Chavez has had both vaccines.  Whitney Chavez still is not on the list for a kidney transplant.  Whitney Chavez is on amiodarone for the atrial fibrillation.  This seems to be keeping Whitney Chavez into sinus rhythm.  Whitney Chavez has had no diarrhea.  Whitney Chavez may be a little constipated.  Overall, I would say Whitney Chavez performance status is ECOG 2.    Medications:  Current Outpatient Medications:  .  acetaminophen (TYLENOL) 325 MG tablet, Take 325 mg by mouth daily as needed for headache (pain). , Disp: , Rfl:  .  amiodarone (PACERONE) 200 MG tablet, Take 200 mg by mouth daily., Disp: , Rfl:  .  apixaban (ELIQUIS) 5 MG TABS tablet, Take 1 tablet (5 mg total) by mouth 2 (two) times daily., Disp: 60 tablet, Rfl: 12 .  atorvastatin (LIPITOR) 20 MG tablet, Take 20 mg by mouth daily. , Disp: , Rfl:  .  B Complex-C-Folic Acid (RENAL VITAMIN PO), Take 1 tablet by mouth daily., Disp: , Rfl:  .  budesonide-formoterol (SYMBICORT) 160-4.5 MCG/ACT inhaler, Inhale 2 puffs into the lungs 2 (two) times daily., Disp: 1 Inhaler, Rfl: 0 .  carvedilol (COREG) 12.5 MG tablet, Take 6.25 mg by mouth in the  morning and at bedtime., Disp: , Rfl:  .  cetirizine (ZYRTEC) 10 MG tablet, Take 10 mg by mouth daily., Disp: , Rfl:  .  Darbepoetin Alfa-Albumin (ARANESP IJ), Inject as directed once a week. At dialysis center, dose varies., Disp: , Rfl:  .  Etelcalcetide HCl (PARSABIV IV), Inject into the vein., Disp: , Rfl:  .  gabapentin (NEURONTIN) 100 MG capsule, Take 1 capsule (100 mg total) by mouth 4 (four) times daily. (Patient taking differently: Take 100 mg by mouth 4 (four) times daily. 1 tablet before Dialysis on Monday, Wednesday and Friday.), Disp: 360 capsule, Rfl: 3 .  lanthanum (FOSRENOL) 1000 MG chewable tablet, Chew 250-500 mg by mouth See admin instructions. Chew 1 tablet (500 mg) by mouth three times daily with meals, chew 1/4 tablet (250 mg) with snacks, Disp: , Rfl:  .  levalbuterol (XOPENEX HFA) 45 MCG/ACT inhaler, Inhale 2 puffs into the lungs every 6 (six) hours as needed for wheezing., Disp: 1 Inhaler, Rfl: 12 .  levalbuterol (XOPENEX) 0.63 MG/3ML nebulizer solution, Take 3 mLs (0.63 mg total) by nebulization every 4 (four) hours as needed for wheezing or shortness of breath., Disp: 75 mL, Rfl: 11 .  losartan (COZAAR) 50 MG tablet, Take 50 mg by mouth daily., Disp: , Rfl:  .  Melatonin 10 MG  TABS, Take 10-20 mg by mouth at bedtime as needed (sleep). , Disp: , Rfl:  .  nortriptyline (PAMELOR) 10 MG capsule, Take 20 mg by mouth at bedtime. , Disp: , Rfl:  .  omeprazole (PRILOSEC) 20 MG capsule, Take 20 mg by mouth daily as needed (acid reflux). Rarely takes, Disp: , Rfl:  .  ondansetron (ZOFRAN-ODT) 4 MG disintegrating tablet, Take 4 mg by mouth every 3 (three) days., Disp: , Rfl:  .  PRESCRIPTION MEDICATION, Inhale into the lungs at bedtime. CPAP with nasal pillow, Disp: , Rfl:  .  rOPINIRole (REQUIP) 0.5 MG tablet, Take 1-2 tablets in the morning po. (Patient taking differently: Take 1-2 tablets in the morning po. 1 tablet before Dialysis on Monday, Wednesday and Friday.), Disp: 180 tablet,  Rfl: 3 .  rOPINIRole (REQUIP) 2 MG tablet, Take 1 tablet (2 mg total) by mouth at bedtime., Disp: 90 tablet, Rfl: 3 .  sulfaSALAzine (AZULFIDINE) 500 MG tablet, Take 1,000 mg by mouth 2 (two) times daily. , Disp: , Rfl:  .  Tiotropium Bromide Monohydrate (SPIRIVA RESPIMAT IN), Inhale into the lungs., Disp: , Rfl:  .  traMADol (ULTRAM) 50 MG tablet, Take 50 mg by mouth 2 (two) times daily as needed (pain). , Disp: , Rfl:  .  triamcinolone cream (KENALOG) 0.1 %, APPLY CREAM EXTERNALLY ONCE DAILY AS NEEDED FOR 30 DAYS, Disp: , Rfl:   Allergies:  Allergies  Allergen Reactions  . Estrogens Other (See Comments)    Blood clots  . Other Other (See Comments)    Unable to take birth control pills due to clotting disorder/fim  . Prednisone Other (See Comments)    Facial edema - has had this 2020 and had no difficulty "can take in small doses"  . Progesterone Other (See Comments)    Blood clots  . Propofol Other (See Comments)    Legs started thrashing and skin tears.  . Tape Rash    Please use paper tape    Past Medical History, Surgical history, Social history, and Family History were reviewed and updated.  Review of Systems: Review of Systems  Constitutional: Negative.   HENT: Negative.   Eyes: Negative.   Respiratory: Negative.   Cardiovascular: Negative.   Gastrointestinal: Negative.   Genitourinary: Negative.   Musculoskeletal: Negative.   Skin: Positive for rash.  Neurological: Negative.   Endo/Heme/Allergies: Negative.   Psychiatric/Behavioral: Negative.      Physical Exam:  vitals were not taken for this visit.   Wt Readings from Last 3 Encounters:  10/04/19 227 lb 6.4 oz (103.1 kg)  07/28/19 227 lb (103 kg)  07/14/19 226 lb (102.5 kg)     Physical Exam Vitals reviewed.  HENT:     Head: Normocephalic and atraumatic.  Eyes:     Pupils: Pupils are equal, round, and reactive to light.  Cardiovascular:     Rate and Rhythm: Normal rate and regular rhythm.      Heart sounds: Normal heart sounds.  Pulmonary:     Effort: Pulmonary effort is normal.     Breath sounds: Normal breath sounds.  Abdominal:     General: Bowel sounds are normal.     Palpations: Abdomen is soft.     Comments: Abdominal exam shows an obese abdomen.  Whitney Chavez has an extended laparotomy scar in the right lower quadrant.  This is healing nicely.  Whitney Chavez does have some subcutaneous nodules which are consistent with the calciphylaxis.  Musculoskeletal:  General: No tenderness or deformity. Normal range of motion.     Cervical back: Normal range of motion.     Comments: Whitney Chavez has marked swelling of the right lower leg.  There is no erythema.  There is no tenderness to palpation.  Whitney Chavez is a negative Homans sign.  Lymphadenopathy:     Cervical: No cervical adenopathy.  Skin:    General: Skin is warm and dry.     Findings: No erythema or rash.  Neurological:     Mental Status: Whitney Chavez is alert and oriented to person, place, and time.  Psychiatric:        Behavior: Behavior normal.        Thought Content: Thought content normal.        Judgment: Judgment normal.     Lab Results  Component Value Date   WBC 5.0 01/12/2020   HGB 9.0 (L) 01/12/2020   HCT 28.4 (L) 01/12/2020   MCV 102.2 (H) 01/12/2020   PLT 107 (L) 01/12/2020     Chemistry      Component Value Date/Time   NA 140 07/14/2019 0943   NA 144 06/25/2017 1146   K 5.1 07/14/2019 0943   K 4.1 06/25/2017 1146   CL 96 (L) 07/14/2019 0943   CL 103 06/25/2017 1146   CO2 30 07/14/2019 0943   CO2 31 06/25/2017 1146   BUN 33 (H) 07/14/2019 0943   BUN 27 (H) 06/25/2017 1146   CREATININE 5.05 (HH) 07/14/2019 0943   CREATININE 4.7 (HH) 06/25/2017 1146      Component Value Date/Time   CALCIUM 8.8 (L) 07/14/2019 0943   CALCIUM 9.7 06/25/2017 1146   ALKPHOS 64 07/14/2019 0943   ALKPHOS 60 06/25/2017 1146   AST 19 07/14/2019 0943   ALT 7 07/14/2019 0943   ALT 14 06/25/2017 1146   BILITOT 0.8 07/14/2019 0943          Impression and Plan: Whitney Chavez is a 64 year old white female. Whitney Chavez has history of recurrent thrombosis. Whitney Chavez has both factor V Leiden and prothrombin II gene mutation.  Whitney Chavez is on Eliquis.  Eliquis is doing very well for Whitney Chavez.  Whitney Chavez needs lifelong Eliquis.  I do believe that Whitney Chavez just is not getting enough Aranesp.  I am not sure what Whitney Chavez iron studies look like.  I am sure that they are being checked by dialysis.  It has been 6 months as we last saw Whitney Chavez.  We will continue to follow Whitney Chavez up every 6 months.  I forgot to mention that Whitney Chavez has lymphedema compression wraps on Whitney Chavez legs.  This is helped a little bit.   Volanda Napoleon, MD 4/29/20219:21 AM

## 2020-01-12 NOTE — Telephone Encounter (Signed)
Appointments scheduled calendar printed per 4/29 los 

## 2020-01-18 ENCOUNTER — Telehealth: Payer: Self-pay | Admitting: Pulmonary Disease

## 2020-01-18 MED ORDER — SPIRIVA RESPIMAT 2.5 MCG/ACT IN AERS
2.0000 | INHALATION_SPRAY | Freq: Every day | RESPIRATORY_TRACT | 5 refills | Status: DC
Start: 2020-01-18 — End: 2020-03-26

## 2020-01-18 NOTE — Telephone Encounter (Signed)
Called and spoke with pt and verified med and preferred pharmacy. Med sent to pharmacy for pt. While speaking with pt, scheduled her a f/u appt with Beth as pt was last seen 07/2019 and told to f/u in 6 months. Nothing further needed.

## 2020-01-24 ENCOUNTER — Encounter: Payer: Self-pay | Admitting: Primary Care

## 2020-01-24 ENCOUNTER — Ambulatory Visit (INDEPENDENT_AMBULATORY_CARE_PROVIDER_SITE_OTHER): Payer: Medicare Other | Admitting: Primary Care

## 2020-01-24 ENCOUNTER — Other Ambulatory Visit: Payer: Self-pay | Admitting: Neurology

## 2020-01-24 ENCOUNTER — Other Ambulatory Visit: Payer: Self-pay

## 2020-01-24 VITALS — BP 114/56 | HR 74 | Temp 97.8°F | Ht 60.0 in | Wt 228.4 lb

## 2020-01-24 DIAGNOSIS — Z992 Dependence on renal dialysis: Secondary | ICD-10-CM | POA: Diagnosis not present

## 2020-01-24 DIAGNOSIS — D6851 Activated protein C resistance: Secondary | ICD-10-CM | POA: Diagnosis not present

## 2020-01-24 DIAGNOSIS — J449 Chronic obstructive pulmonary disease, unspecified: Secondary | ICD-10-CM | POA: Diagnosis not present

## 2020-01-24 DIAGNOSIS — N186 End stage renal disease: Secondary | ICD-10-CM

## 2020-01-24 MED ORDER — SPIRIVA RESPIMAT 2.5 MCG/ACT IN AERS: 2.0000 | INHALATION_SPRAY | Freq: Every day | RESPIRATORY_TRACT | 0 refills | Status: DC

## 2020-01-24 MED ORDER — BUDESONIDE-FORMOTEROL FUMARATE 160-4.5 MCG/ACT IN AERO: 2.0000 | INHALATION_SPRAY | Freq: Two times a day (BID) | RESPIRATORY_TRACT | 6 refills | Status: DC

## 2020-01-24 MED ORDER — SPIRIVA RESPIMAT 2.5 MCG/ACT IN AERS
2.0000 | INHALATION_SPRAY | Freq: Every day | RESPIRATORY_TRACT | 3 refills | Status: DC
Start: 2020-01-24 — End: 2020-01-25

## 2020-01-24 NOTE — Patient Instructions (Addendum)
Options other than Spiriva respimat are: Spiriva handihaler, Incruse Ellipta and Tudorza  OR we could try Trelegy, Bevespi (which would replace both Symbicort and Spiriva)  Recommendations: Continue Symbicort 160 two puffs twice daily Continue Spiriva respimat two puffs once daily Congratulations on all your hard work on your weight loss!!! Continue to wear your CPAP every night for 4-6 hours or more   Follow-up: 6 months with Dr. Loanne Drilling

## 2020-01-24 NOTE — Progress Notes (Signed)
@Patient  ID: Whitney Chavez, female    DOB: [birth year], 64 y.o.   MRN: [id number]  Chief Complaint  Patient presents with  . Follow-up    6 month follow up COPD, JE pt.  pt states she is stable    Referring provider: Kathyrn Lass, MD  HPI:  64 year old female, former smoker quit around 2000 (60 pack years). PMH significant for moderate COPD-asthma overlap, airway hyperreactivity, sleep apnea, ESRD on HD, factor 5 leiden mutation (on anticoagulation), chronic pain syndrome, obesity. Patient of Dr. Loanne Drilling. Maintained on Symbicort 160, started on Spiriva respimat 2.40mcg.   Previous [facility] pulmonary encounters: 07/28/2019 Patient presents today for follow-up visit with PFTs. She feels Symbicort and Spiriva have been beneficial to her breathing and upper airway congestion. She is concerned with increased heart rate with Albuterol and would like to change to Levalbuterol. She was seen earlier this year by PCP for an exacerbation and improved with nebulized bronchodilator and steroid injection. She continues on HD. Working on weight loss, her goal is to lose 20 lbs before being considered for kidney transplant.   01/24/2020 Patient presents today for 6 month follow-up visit. She has been doing much better, states that this is the best she has been in two years. She still has some mucus that she coughs up, not as much as before and is chronic. She continues Symbicort 160 and Spiriva which helps with this. Her insurance doesn't cover spiriva respimat formulary. She has not had to use her nebulizer once. She saw cardiologist last week, she is off her blood pressure medication. She also follows with hematologist and continues Eliquis. She is on dialysis three days a week. She is working on weight loss so that she can get a kidney transplant. She is wearing leg wraps and has lost 18 pounds . Uses walker and cane and working on leg strength. Another goal of hers is to get stronger to visit her sister in  [place], [place]. Received both covid vaccines.   PFTs 07/28/2019 FVC 1.59 (55%), FEV1 1.24 (56%), ratio 78, normal DLCO Moderate obstruction with positive BD response. Curvature to flow volume loop.   12/29/17 PFTs with MCT- FVC 1.73 (59%), FEV1 1.30 (58%), ratio 75 Moderate obstruction with positive hyper-reactive airway/ FEV1 declined by more than 20%  Allergies  Allergen Reactions  . Estrogens Other (See Comments)    Blood clots  . Other Other (See Comments)    Unable to take birth control pills due to clotting disorder/fim  . Prednisone Other (See Comments)    Facial edema - has had this 2020 and had no difficulty "can take in small doses"  . Progesterone Other (See Comments)    Blood clots  . Propofol Other (See Comments)    Legs started thrashing and skin tears.  . Tape Rash    Please use paper tape    Immunization History  Administered Date(s) Administered  . Influenza-Unspecified 06/22/2015, 06/25/2016, 06/30/2019  . Moderna SARS-COVID-2 Vaccination 12/01/2019, 12/29/2019  . Pneumococcal Conjugate-13 11/27/2014  . Pneumococcal Polysaccharide-23 07/11/2016  . Zoster Recombinat (Shingrix) 04/11/2017, 08/17/2017    Past Medical History:  Diagnosis Date  . Asthma   . Axillary vein thrombosis ([place]) 08/16/2015  . Calciphylaxis   . Chronic kidney disease   . Colitis   . COPD (chronic obstructive pulmonary disease) ([place])   . Hypertension   . Lymphedema    legs  . MRSA (methicillin resistant staph aureus) culture positive   . Neuropathy   .  Obstructive sleep apnea 03/29/2015  . OSA on CPAP   . Vision abnormalities     Tobacco History: Social History   Tobacco Use  Smoking Status Former Smoker  . Packs/day: 2.00  . Years: 30.00  . Pack years: 60.00  . Quit date: 11/25/1998  . Years since quitting: 21.1  Smokeless Tobacco Never Used   Counseling given: Not Answered   Outpatient Medications Prior to Visit  Medication Sig Dispense Refill  . acetaminophen  (TYLENOL) 325 MG tablet Take 325 mg by mouth daily as needed for headache (pain).     Marland Kitchen amiodarone (PACERONE) 200 MG tablet Take 200 mg by mouth daily.    Marland Kitchen apixaban (ELIQUIS) 5 MG TABS tablet Take 1 tablet (5 mg total) by mouth 2 (two) times daily. 60 tablet 12  . atorvastatin (LIPITOR) 20 MG tablet Take 20 mg by mouth daily.     . B Complex-C-Folic Acid (RENAL VITAMIN PO) Take 1 tablet by mouth daily.    . cetirizine (ZYRTEC) 10 MG tablet Take 10 mg by mouth daily.    . Darbepoetin Alfa-Albumin (ARANESP IJ) Inject as directed once a week. At dialysis center, dose varies.    Regino Schultze Bandages & Supports (MEDICAL COMPRESSION STOCKINGS) MISC by Does not apply route. Leg wraps.    . Etelcalcetide HCl (PARSABIV IV) Inject into the vein. 01/12/2020 Given every dialysis treatment.    Marland Kitchen lanthanum (FOSRENOL) 1000 MG chewable tablet Chew 250-500 mg by mouth See admin instructions. Chew 1 tablet (500 mg) by mouth three times daily with meals, chew 1/4 tablet (250 mg) with snacks    . levalbuterol (XOPENEX HFA) 45 MCG/ACT inhaler Inhale 2 puffs into the lungs every 6 (six) hours as needed for wheezing. 1 Inhaler 12  . levalbuterol (XOPENEX) 0.63 MG/3ML nebulizer solution Take 3 mLs (0.63 mg total) by nebulization every 4 (four) hours as needed for wheezing or shortness of breath. 75 mL 11  . Melatonin 10 MG TABS Take 10-20 mg by mouth at bedtime as needed (sleep).     . nortriptyline (PAMELOR) 10 MG capsule Take 20 mg by mouth at bedtime.     Marland Kitchen omeprazole (PRILOSEC) 20 MG capsule Take 20 mg by mouth daily as needed (acid reflux). Rarely takes    . ondansetron (ZOFRAN-ODT) 4 MG disintegrating tablet Take 4 mg by mouth every 3 (three) days.    Marland Kitchen PRESCRIPTION MEDICATION Inhale into the lungs at bedtime. CPAP with nasal pillow    . rOPINIRole (REQUIP) 0.5 MG tablet Take 1-2 tablets in the morning po. (Patient taking differently: 0.5 mg daily. Marland Kitchen) 180 tablet 3  . rOPINIRole (REQUIP) 2 MG tablet Take 1 tablet (2 mg  total) by mouth at bedtime. 90 tablet 3  . sulfaSALAzine (AZULFIDINE) 500 MG tablet Take 1,000 mg by mouth 2 (two) times daily.     . Tiotropium Bromide Monohydrate (SPIRIVA RESPIMAT) 2.5 MCG/ACT AERS Inhale 2 puffs into the lungs daily. 4 g 5  . traMADol (ULTRAM) 50 MG tablet Take 50 mg by mouth 2 (two) times daily as needed (pain).     . triamcinolone cream (KENALOG) 0.1 % APPLY CREAM EXTERNALLY ONCE DAILY AS NEEDED FOR 30 DAYS    . budesonide-formoterol (SYMBICORT) 160-4.5 MCG/ACT inhaler Inhale 2 puffs into the lungs 2 (two) times daily. 1 Inhaler 0  . gabapentin (NEURONTIN) 100 MG capsule Take 1 capsule (100 mg total) by mouth 4 (four) times daily. (Patient taking differently: Take 100 mg by mouth 4 (four) times  daily. 01/12/2020 1 tablet before Dialysis on Monday, Wednesday and Friday, then Daily:  100 mg at 3pm and 200mg  at 9 pm.) 360 capsule 3  . levalbuterol (XOPENEX) 0.63 MG/3ML nebulizer solution Inhale into the lungs.    . carvedilol (COREG) 12.5 MG tablet Take 6.25 mg by mouth in the morning and at bedtime.     No facility-administered medications prior to visit.    Review of Systems  Review of Systems  Constitutional: Negative.   Respiratory: Positive for cough. Negative for apnea, shortness of breath and wheezing.   Cardiovascular: Negative.    Physical Exam  BP (!) 114/56 (BP Location: Right Wrist, Cuff Size: Normal)   Pulse 74   Temp 97.8 F (36.6 C) (Temporal)   Ht 5' (1.524 m)   Wt 228 lb 6.4 oz (103.6 kg)   SpO2 100%   BMI 44.61 kg/m  Physical Exam Constitutional:      Appearance: Normal appearance.  HENT:     Head: Normocephalic and atraumatic.     Mouth/Throat:     Mouth: Mucous membranes are moist.     Pharynx: Oropharynx is clear.  Cardiovascular:     Rate and Rhythm: Normal rate and regular rhythm.  Pulmonary:     Effort: Pulmonary effort is normal.     Breath sounds: Normal breath sounds. No wheezing or rhonchi.     Comments: CTA Neurological:      General: No focal deficit present.     Mental Status: She is alert and oriented to person, place, and time. Mental status is at baseline.  Psychiatric:        Mood and Affect: Mood normal.        Behavior: Behavior normal.        Thought Content: Thought content normal.        Judgment: Judgment normal.      Lab Results:  CBC    Component Value Date/Time   WBC 5.0 01/12/2020 0851   WBC 6.8 05/24/2018 2123   RBC 2.78 (L) 01/12/2020 0851   HGB 9.0 (L) 01/12/2020 0851   HGB 9.9 (L) 06/25/2017 1146   HCT 28.4 (L) 01/12/2020 0851   HCT 32.3 (L) 06/25/2017 1146   PLT 107 (L) 01/12/2020 0851   PLT 202 06/25/2017 1146   MCV 102.2 (H) 01/12/2020 0851   MCV 92 06/25/2017 1146   MCH 32.4 01/12/2020 0851   MCHC 31.7 01/12/2020 0851   RDW 17.7 (H) 01/12/2020 0851   RDW 16.4 (H) 06/25/2017 1146   LYMPHSABS 1.0 01/12/2020 0851   LYMPHSABS 1.2 06/25/2017 1146   MONOABS 0.6 01/12/2020 0851   EOSABS 0.5 01/12/2020 0851   EOSABS 0.2 06/25/2017 1146   BASOSABS 0.0 01/12/2020 0851   BASOSABS 0.0 06/25/2017 1146    BMET    Component Value Date/Time   NA 139 01/12/2020 0851   NA 144 06/25/2017 1146   K 5.0 01/12/2020 0851   K 4.1 06/25/2017 1146   CL 96 (L) 01/12/2020 0851   CL 103 06/25/2017 1146   CO2 28 01/12/2020 0851   CO2 31 06/25/2017 1146   GLUCOSE 92 01/12/2020 0851   GLUCOSE 119 (H) 06/25/2017 1146   BUN 29 (H) 01/12/2020 0851   BUN 27 (H) 06/25/2017 1146   CREATININE 4.96 (HH) 01/12/2020 0851   CREATININE 4.7 (HH) 06/25/2017 1146   CALCIUM 8.8 (L) 01/12/2020 0851   CALCIUM 9.7 06/25/2017 1146   GFRNONAA 9 (L) 01/12/2020 0851   GFRAA 10 (L) 01/12/2020 8546  BNP    Component Value Date/Time   BNP 434.6 (H) 10/23/2017 1058    ProBNP No results found for: PROBNP  Imaging: No results found.   Assessment & Plan:   COPD, moderate ([place]) - Stable interval, no recent exacerbations - Continue Symbicort 160 two puffs twice daily - Continue Spiriva  respimat 2.43mcg  two puffs once daily - Up to date with covid vaccination    Factor V Leiden mutation ([place]) - Continue Eliquis 5mg  twice daily  End-stage renal disease on hemodialysis ([place]) - Continues HD three days a week, most recent creatinine 4.96 - She is working on weight loss to be considered for kidney transplant    Martyn Ehrich, NP 01/25/2020

## 2020-01-25 NOTE — Assessment & Plan Note (Addendum)
-   Continues HD three days a week, most recent creatinine 4.96 - She is working on weight loss to be considered for kidney transplant

## 2020-01-25 NOTE — Assessment & Plan Note (Addendum)
-   Stable interval, no recent exacerbations - Continue Symbicort 160 two puffs twice daily - Continue Spiriva respimat 2.19mcg  two puffs once daily - Up to date with covid vaccination

## 2020-01-25 NOTE — Assessment & Plan Note (Signed)
Continue Eliquis 5 mg twice daily. 

## 2020-01-26 ENCOUNTER — Other Ambulatory Visit: Payer: Self-pay | Admitting: *Deleted

## 2020-01-27 IMAGING — CT CT ABD-PELV W/O CM
2 of 4 series · 16 of 46 positions shown, 18 images · non-contrast
Comparison: CT 03/23/2018

CLINICAL DATA: Acute abdominal pain. Patient reports a wound VAC
placed 4 days ago for right lower quadrant abdominal wound,
increasing redness. Progressive pain.

EXAM:
CT ABDOMEN AND PELVIS WITHOUT CONTRAST
TECHNIQUE: Multidetector CT imaging of the abdomen and pelvis was performed
following the standard protocol without IV contrast.

[Series 2: axial st · axial · 0.98mm/px · z∈[-436,-32]mm · 13 of 89 slices shown, 15 images]
[im 4/89  soft-tissue]
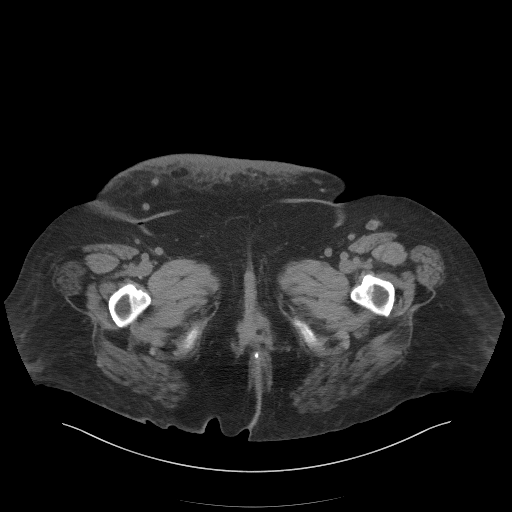
[im 4/89  bone]
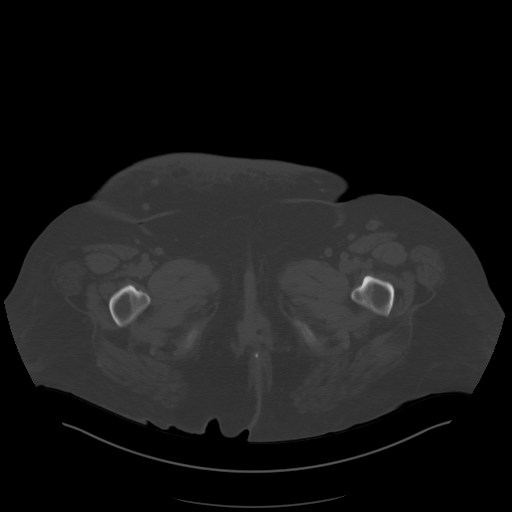
[im 12/89  soft-tissue]
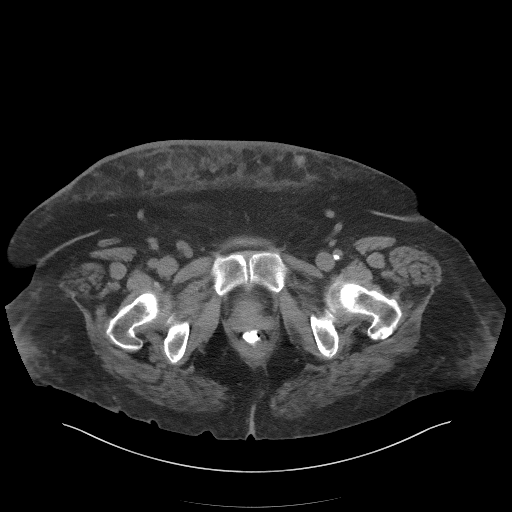
[im 19/89  soft-tissue]
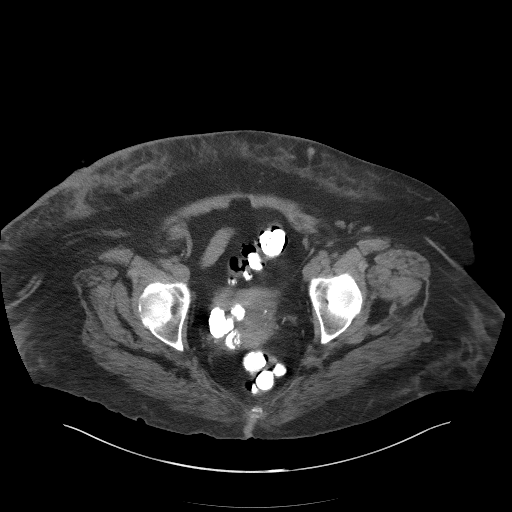
[im 26/89  soft-tissue]
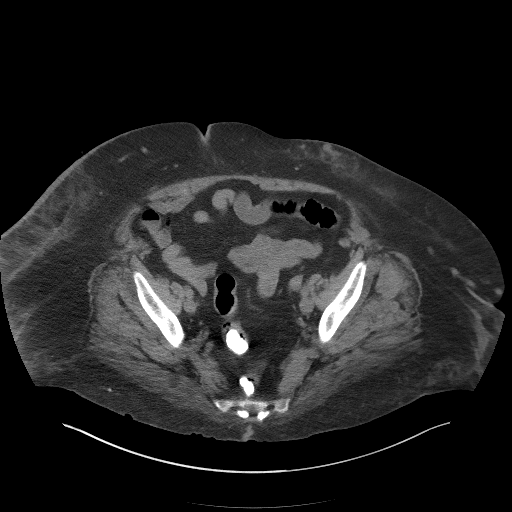
[im 30/89  soft-tissue]
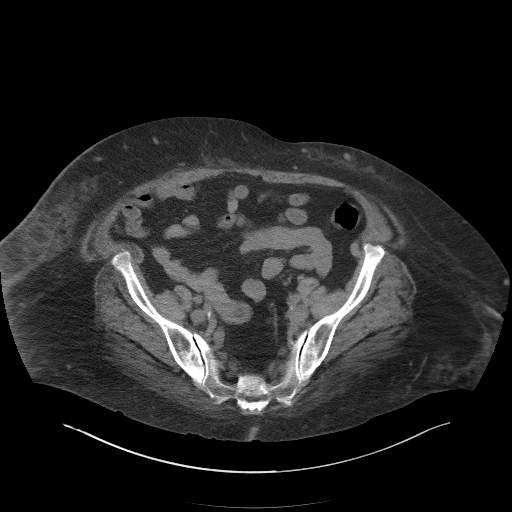
[im 37/89  soft-tissue]
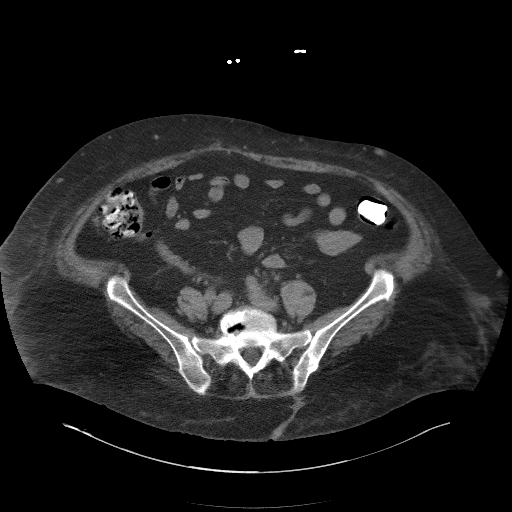
[im 45/89  soft-tissue]
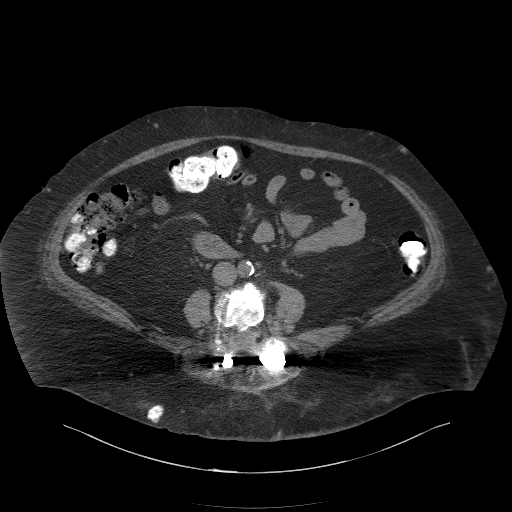
[im 52/89  soft-tissue]
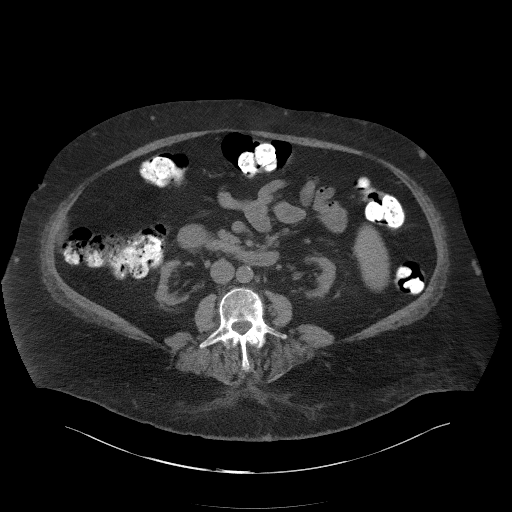
[im 59/89  soft-tissue]
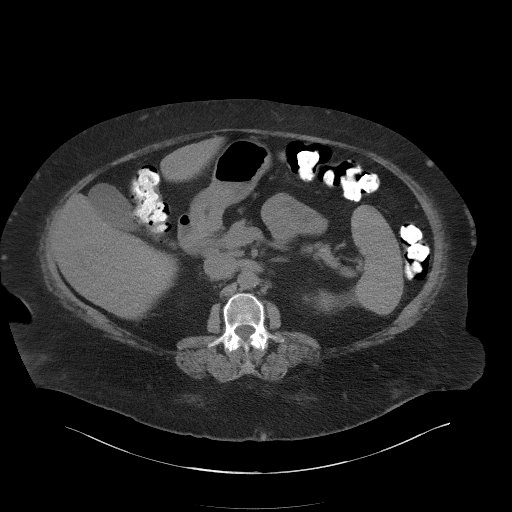
[im 59/89  bone]
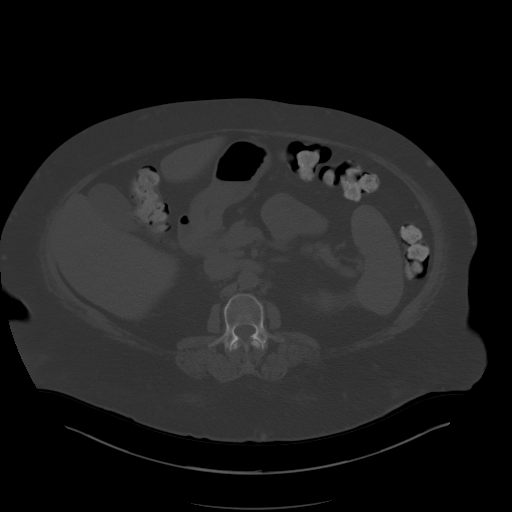
[im 63/89  soft-tissue]
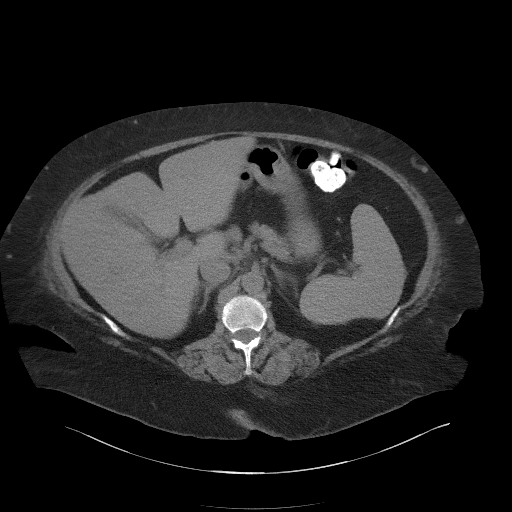
[im 70/89  soft-tissue]
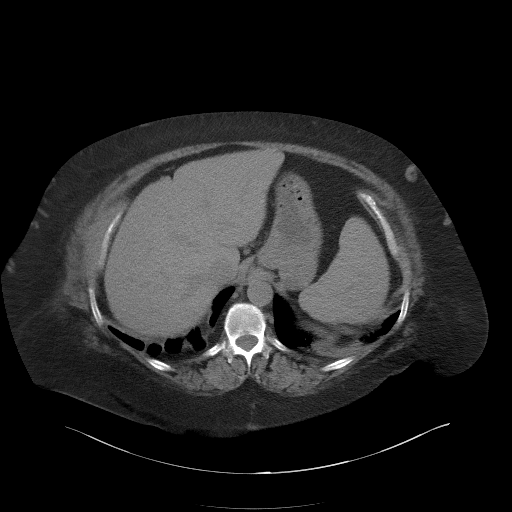
[im 78/89  soft-tissue]
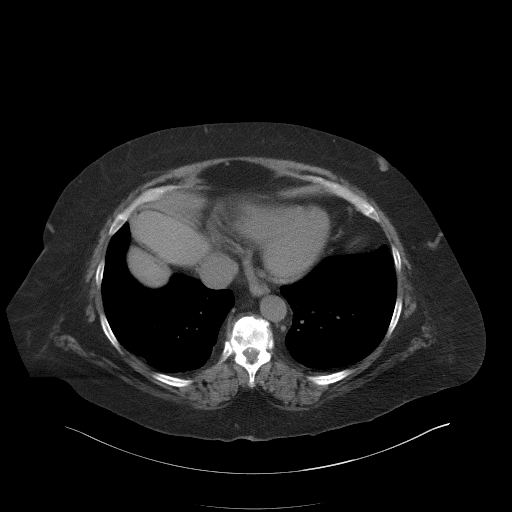
[im 85/89  soft-tissue]
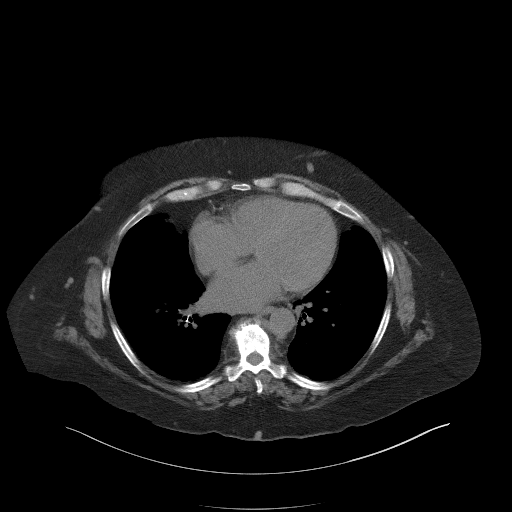

[Series 5: coronal st · coronal · 0.94mm/px · 3 of 110 slices shown]
[im 37/110  soft-tissue]
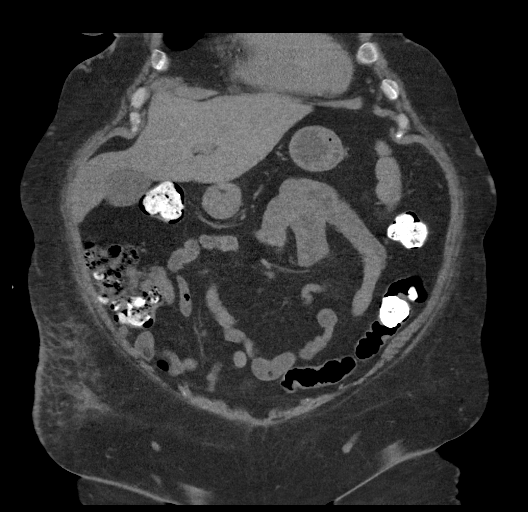
[im 49/110  soft-tissue]
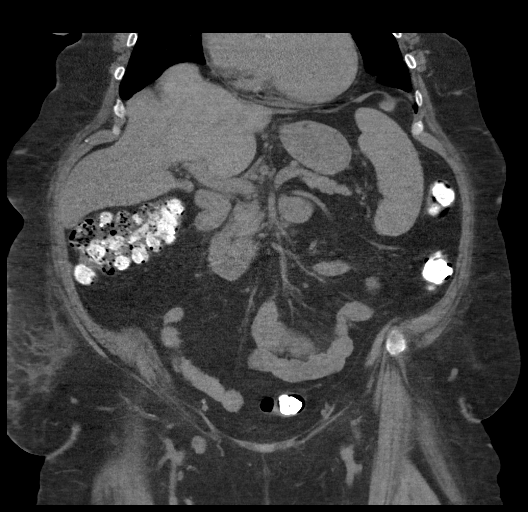
[im 61/110  soft-tissue]
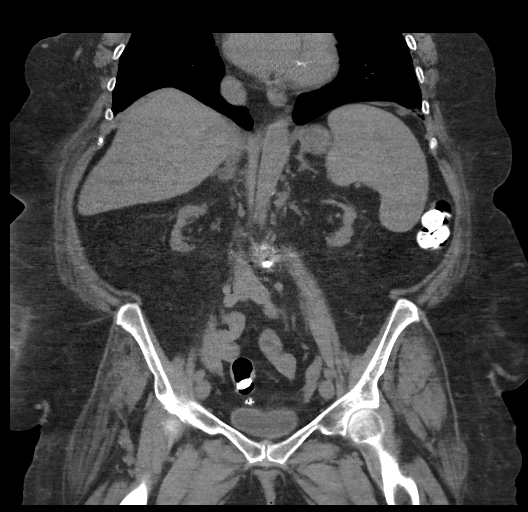

[16 of 46 positions shown; findings below may reference images not displayed]

FINDINGS: Lower chest: Subpleural scarring in both lung bases. No acute
airspace disease. Borderline heart size unchanged.

Hepatobiliary: Prominent size liver spanning 19.1 cm cranial caudal.
No focal lesion on noncontrast exam. Gallbladder physiologically
distended, no calcified stone. No biliary dilatation.

Pancreas: Parenchymal atrophy. No ductal dilatation or inflammation.

Spleen: Upper normal in size spanning 13 cm.  No focal abnormality.

Adrenals/Urinary Tract: Normal adrenal glands. Marked bilateral
renal atrophy with chronic perinephric edema. No hydronephrosis.
Urinary bladder minimally distended.

Stomach/Bowel: Tiny hiatal hernia. Stomach physiologically
distended. No evidence of bowel wall thickening, inflammatory change
or obstruction. High-density material throughout the colon
suggestive of enteric contrast, however patient denies recent oral
contrast exam. Normal appendix. Minimal colonic diverticulosis
without diverticulitis.

Vascular/Lymphatic: Aortic atherosclerosis without aneurysm.
Multiple anterior abdominal wall collaterals again seen. Few
prominent bilateral inguinal and external iliac nodes not enlarged
by size criteria.

Reproductive: Calcified uterine fibroids.  No adnexal mass.

Other: Skin thickening and subcutaneous edema of the lower anterior
abdominal wall, greater on the right. Progression of inflammatory
changes and skin thickening since [REDACTED] CT. No focal fluid
collection or soft tissue air. Lesser subcutaneous edema in the
flanks.

Musculoskeletal: Posterior L4-L5 fusion. Adjacent degenerative disc
disease at L3-L4. Multilevel degenerative change in the thoracic
spine partially included.
IMPRESSION: 1. Skin thickening and subcutaneous edema of the lower anterior
abdominal wall, right greater than left, progressed from CT 2 months
ago. Previous soft tissue air is no longer visualized. No focal
fluid collection. No evidence of deep extension into the
abdominal/pelvic cavity.
2. No other acute abnormality in the abdomen/pelvis.

## 2020-02-14 ENCOUNTER — Telehealth: Payer: Self-pay | Admitting: Primary Care

## 2020-02-14 NOTE — Telephone Encounter (Signed)
Received notice from [employer] that patient needs proof of income for patient assistance for spiriva. Please let her know.

## 2020-03-22 NOTE — Telephone Encounter (Signed)
Called and spoke with pt to see if she had received a letter for patient assistance for spiriva and she stated that she did. She also received another letter stating that they denied her patient assistance due to her making too much money.  Pt states that she has 1.5 months of spiriva left prior to running out. Pt wants to know what her other options are for alternatives to the spiriva piror to another Rx of the spiriva being sent to the pharmacy as the cost is too much.  Routing this to [employer] as well as pharmacy team for benefits investigation for [facility].

## 2020-03-22 NOTE — Telephone Encounter (Signed)
Thanks

## 2020-03-26 MED ORDER — INCRUSE ELLIPTA 62.5 MCG/INH IN AEPB: 1.0000 | INHALATION_SPRAY | Freq: Every day | RESPIRATORY_TRACT | 5 refills | Status: DC

## 2020-03-26 NOTE — Telephone Encounter (Signed)
Insurance prefere Incruse over [place]. I sent a prescription in for this. This will replace spiriva. Continue Symbicort

## 2020-03-26 NOTE — Telephone Encounter (Signed)
Called and spoke with pt letting her know the info stated by [place] and she verbalized understanding. Nothing further needed.

## 2020-03-26 NOTE — Telephone Encounter (Signed)
Plan prefers Incruse inhaler. Copay for 1 month supply is $47.00.

## 2020-04-10 ENCOUNTER — Other Ambulatory Visit: Payer: Self-pay

## 2020-04-10 ENCOUNTER — Encounter: Payer: Self-pay | Admitting: Neurology

## 2020-04-10 ENCOUNTER — Ambulatory Visit (INDEPENDENT_AMBULATORY_CARE_PROVIDER_SITE_OTHER): Payer: Medicare Other | Admitting: Neurology

## 2020-04-10 VITALS — BP 95/55 | HR 81 | Ht 60.0 in | Wt 226.5 lb

## 2020-04-10 DIAGNOSIS — Z992 Dependence on renal dialysis: Secondary | ICD-10-CM

## 2020-04-10 DIAGNOSIS — G2581 Restless legs syndrome: Secondary | ICD-10-CM | POA: Diagnosis not present

## 2020-04-10 DIAGNOSIS — N186 End stage renal disease: Secondary | ICD-10-CM | POA: Diagnosis not present

## 2020-04-10 DIAGNOSIS — G629 Polyneuropathy, unspecified: Secondary | ICD-10-CM | POA: Diagnosis not present

## 2020-04-10 DIAGNOSIS — G250 Essential tremor: Secondary | ICD-10-CM

## 2020-04-10 DIAGNOSIS — G4733 Obstructive sleep apnea (adult) (pediatric): Secondary | ICD-10-CM | POA: Diagnosis not present

## 2020-04-10 MED ORDER — GABAPENTIN 100 MG PO CAPS: 100.0000 mg | ORAL_CAPSULE | Freq: Four times a day (QID) | ORAL | 3 refills | Status: DC

## 2020-04-10 MED ORDER — ROPINIROLE HCL 2 MG PO TABS: 2.0000 mg | ORAL_TABLET | Freq: Every day | ORAL | 3 refills | Status: DC

## 2020-04-10 MED ORDER — ROPINIROLE HCL 0.5 MG PO TABS: ORAL_TABLET | ORAL | 3 refills | Status: DC

## 2020-04-10 NOTE — Progress Notes (Signed)
[facility]  PATIENT: Whitney Chavez DOB: [birth year]  REFERRING DOCTOR OR PCP:  none SOURCE: patient and records from [facility]  _________________________________   HISTORICAL  CHIEF COMPLAINT:  Chief Complaint  Patient presents with  . Follow-up    RM 12, alone. Last seen 10/04/2019. Ambulates with cane. No falls since last seen. Has had calciphylaxis flare up, has wound on left inner thigh. Going to wound clinic with [facility] for this. After covid-19 vaccine (moderna) calciphylaxis flared up. Second toe on feet, bilaterally, had black calluses on both. Saw foot doctor and they were removed. She is healed up from this. Waiting to hear if tendon needs to be cut.   . Sleep Apnea    Download printed from [facility]med. Tolerating CPAP machine well, no issues.    HISTORY OF PRESENT ILLNESS:  [name] is a 64 year old woman with Obstructive sleep apnea , insomnia, restless leg syndrome and polyneuropathy.      Update 04/10/2020: She is using CPAP nightly.   Compliance was 97% and efficacy was good (AHI = 4.4).   She gets some RLS symptoms during HD.   She now has lymphedema as well in her legs and RLS seemed worse after that.  Her dry weight was increased and symptoms are better. She takes gabapentin 300 mg a day (100 in pm and 200 at bedtime) and ropinirole 0.5 mg in pm and 2 mg at bedtime)   She takes additional ropinirole 0.5 mg before HD.      Neuropathy has done better with the gabapentin and tramadol.     She has mild EDS.  EPWORTH SLEEPINESS SCALE  On a scale of 0 - 3 what is the chance of dozing:  Sitting and Reading:   3 Watching TV:    2 Sitting inactive in a public place: 2 Passenger in car for one hour: 1 Lying down to rest in the afternoon: 3 Sitting and talking to someone: 0 Sitting quietly after lunch:  2 In a car, stopped in traffic:  0  Total (out of 24):  13/24   Mild EDS   After her Moderna vaccine she had calciphylaxis.   She is being treated with sodium thiosulfate at HD.   She has ESRD after after renal failure from Vit D.     She has a lot of leg pain and is on a Butrans patch.   Update 04/05/2019: She feels gabapentin and ropinirole is helping her RLS.    The gabapentin also helps her neuropathic pain in her feet .   The tramadol started for another indication also helps.   She also developed lymphedema and sees a specialist at [facility].    She hsa lost 17 pounds of fluid  She has ESRD on HD and developed calciphylaxis with lumps of calcium in her skin.  Now treated with sodium thiopental after her HD treatments.    Due to pain she has been placed on tramadol for the calcitosis.    She is on tramadol 50 mg twice a day for the pain.   She tolerates it very well.  She has OSA and is doing well.   She sleeps through the night and feels refresehd when she wakes up.    She is on +11 cm H2O pressure.   She wears it for naps if she takes one at home.    She does doze off at ESRD (can't wear CPAP there) but not with other activities.  Update 04/01/2017: She has had more RLS and neuropathy/.dysesthesia symptoms.    RLS is much worse with dialysis.       She is currently taking gabapentin 100 mg x 3 daily (one at dialysis)   She is on 1/2 mg ropinirole during the day and 2 mg at night.     She has calciphylaxis and has a lot of calcification issues with subcutaneous large nodules.   Also has Dercum's disease.   These issues have caused the lesions 'eaten' throug the skin and she has abdominal and hip lesions.   She is being treated for these issues.   She does wound care there.     She also sees pain management and is on tramadol.     Her severe OSA does well and she uses CPAP daily and can't sleep well without it.     RLS bothers her just mildly at .night.   Insomnia is variable, bad last night  She needs new orthotics for her shoes  From 03/31/2017: She is having more difficulty getting around her house to perform  activities of daily living and would benefit from a power vehicle such as a scooter.  Restless leg syndrome/polyneuropathy:  She gets RLS, worse with HD.   A combination of gabapentin with ropinirole have greatly helped.   Her RLS was severe and occurred day and night, worse after each HD session.   She feels she sleeps better with the RLS med's.     She has polyneuropathy with numbness up to her ankles and has been stable the past year. . A nerve conduction study/EMG February 2015 showed a length dependent sensory motor axonal polyneuropathy with active features in the lower leg history she had a superimposed mild-to-moderate right carpal tunnel syndrome.   She has special orthotics that help her foot pain some.    Gait:   Due to the polyneuropathy, she has a gait disturbance and has occasional falls. She uses a cane but has difficulty walking longer distances   OSA:  She is sleeping well with CPAP  +10 cm every night.   A PSG study February 2015 showed an AHI equals 44.7 with very poor sleep efficiency. Due to the poor sleep efficiency she had AutoPap as an outpatient to avoid another night in the laboratory.   She does not note much sleepiness but may lay nod off for a few minutes around 3 pm and then does well the rest of the day.     Insomnia:  She is sleeping much better on the combination of CPAP, gabapentin and ropinirole.  She has sleep maintenance more than sleep onset insomnia. She takes 400 mg gabapentin with benefit. Insomnia is worse night after hemodialysis.  Other: She is on hemodialysis for CRF due to vitamin D toxicity. She has had anemia associated with ESRD.    She has painful lumps in her skin and subcutaneous tissue felt due to the Aranesp/Epogen.     REVIEW OF SYSTEMS: Constitutional: No fevers, chills, sweats, or change in appetite Eyes: No visual changes, double vision, eye pain Ear, nose and throat: No hearing loss, ear pain, nasal congestion, sore throat Cardiovascular: No  chest pain, palpitations Respiratory: No shortness of breath at rest or with exertion.   No wheezes GastrointestinaI: No nausea, vomiting, diarrhea, abdominal pain, fecal incontinence Genitourinary: She is on hemodialysis. Musculoskeletal: No neck pain, back pain Integumentary: No rash, pruritus, skin lesions Neurological: as above Psychiatric: No depression at this time.  No anxiety  Endocrine: No palpitations, diaphoresis, change in appetite, change in weigh or increased thirst Hematologic/Lymphatic: No anemia, purpura, petechiae. Allergic/Immunologic: No itchy/runny eyes, nasal congestion, recent allergic reactions, rashes  ALLERGIES: Allergies  Allergen Reactions  . Estrogens Other (See Comments)    Blood clots  . Other Other (See Comments)    Unable to take birth control pills due to clotting disorder/fim  . Prednisone Other (See Comments)    Facial edema - has had this 2020 and had no difficulty "can take in small doses"  . Progesterone Other (See Comments)    Blood clots  . Propofol Other (See Comments)    Legs started thrashing and skin tears.  . Tape Rash    Please use paper tape    HOME MEDICATIONS:  Current Outpatient Medications:  .  acetaminophen (TYLENOL) 325 MG tablet, Take 325 mg by mouth daily as needed for headache (pain). , Disp: , Rfl:  .  amiodarone (PACERONE) 200 MG tablet, Take 200 mg by mouth daily., Disp: , Rfl:  .  apixaban (ELIQUIS) 5 MG TABS tablet, Take 1 tablet (5 mg total) by mouth 2 (two) times daily., Disp: 60 tablet, Rfl: 12 .  atorvastatin (LIPITOR) 20 MG tablet, Take 20 mg by mouth daily. , Disp: , Rfl:  .  B Complex-C-Folic Acid (RENAL VITAMIN PO), Take 1 tablet by mouth daily., Disp: , Rfl:  .  budesonide-formoterol (SYMBICORT) 160-4.5 MCG/ACT inhaler, Inhale 2 puffs into the lungs 2 (two) times daily., Disp: 1 Inhaler, Rfl: 6 .  buprenorphine (BUTRANS) 10 MCG/HR PTWK, Place 1 patch onto the skin every 7 (seven) days., Disp: , Rfl:  .   cetirizine (ZYRTEC) 10 MG tablet, Take 10 mg by mouth daily., Disp: , Rfl:  .  Darbepoetin Alfa-Albumin (ARANESP IJ), Inject as directed once a week. At dialysis center, dose varies., Disp: , Rfl:  .  Elastic Bandages & Supports ([place]) MISC, by Does not apply route. Leg wraps., Disp: , Rfl:  .  Etelcalcetide HCl (PARSABIV IV), Inject 1 Dose into the vein 3 (three) times a week. Receives at Dialysis, Disp: , Rfl:  .  gabapentin (NEURONTIN) 100 MG capsule, TAKE 1 CAPSULE (100 MG TOTAL) BY MOUTH 4 (FOUR) TIMES DAILY., Disp: 360 capsule, Rfl: 3 .  lanthanum (FOSRENOL) 1000 MG chewable tablet, Chew 250-500 mg by mouth See admin instructions. Chew 1 tablet (500 mg) by mouth three times daily with meals, chew 1/4 tablet (250 mg) with snacks, Disp: , Rfl:  .  levalbuterol (XOPENEX HFA) 45 MCG/ACT inhaler, Inhale 2 puffs into the lungs every 6 (six) hours as needed for wheezing., Disp: 1 Inhaler, Rfl: 12 .  Melatonin 10 MG TABS, Take 10-20 mg by mouth at bedtime as needed (sleep). , Disp: , Rfl:  .  nortriptyline (PAMELOR) 10 MG capsule, Take 20 mg by mouth at bedtime. , Disp: , Rfl:  .  omeprazole (PRILOSEC) 20 MG capsule, Take 20 mg by mouth daily as needed (acid reflux). Rarely takes, Disp: , Rfl:  .  PRESCRIPTION MEDICATION, Inhale into the lungs at bedtime. CPAP with nasal pillow, Disp: , Rfl:  .  rOPINIRole (REQUIP) 0.5 MG tablet, Take 1-2 tablets in the morning po. (Patient taking differently: 0.5 mg daily. Marland Kitchen), Disp: 180 tablet, Rfl: 3 .  rOPINIRole (REQUIP) 2 MG tablet, Take 1 tablet (2 mg total) by mouth at bedtime., Disp: 90 tablet, Rfl: 3 .  SODIUM THIOSULFATE IV, Inject 1 Dose into the vein 3 (three) times a week. Receives  at Dialysis, Disp: , Rfl:  .  sulfaSALAzine (AZULFIDINE) 500 MG tablet, Take 1,000 mg by mouth 2 (two) times daily. , Disp: , Rfl:  .  traMADol (ULTRAM) 50 MG tablet, Take 50 mg by mouth 2 (two) times daily as needed (pain). , Disp: , Rfl:  .   triamcinolone cream (KENALOG) 0.1 %, APPLY CREAM EXTERNALLY ONCE DAILY AS NEEDED FOR 30 DAYS, Disp: , Rfl:  .  levalbuterol (XOPENEX) 0.63 MG/3ML nebulizer solution, Take 3 mLs (0.63 mg total) by nebulization every 4 (four) hours as needed for wheezing or shortness of breath. (Patient not taking: Reported on 04/10/2020), Disp: 75 mL, Rfl: 11 .  umeclidinium bromide (INCRUSE ELLIPTA) 62.5 MCG/INH AEPB, Inhale 1 puff into the lungs daily. (Patient not taking: Reported on 04/10/2020), Disp: 30 each, Rfl: 5  PAST MEDICAL HISTORY: Past Medical History:  Diagnosis Date  . Asthma   . Axillary vein thrombosis ([place]) 08/16/2015  . Calciphylaxis   . Chronic kidney disease   . Colitis   . COPD (chronic obstructive pulmonary disease) ([place])   . Hypertension   . Lymphedema    legs  . MRSA (methicillin resistant staph aureus) culture positive   . Neuropathy   . Obstructive sleep apnea 03/29/2015  . OSA on CPAP   . Vision abnormalities     PAST SURGICAL HISTORY: Past Surgical History:  Procedure Laterality Date  . APPLICATION OF WOUND VAC    . AV FISTULA PLACEMENT Left   . lumbar decompression fusion    . TONSILLECTOMY      FAMILY HISTORY: Family History  Problem Relation Age of Onset  . Diabetes Mother   . Congestive Heart Failure Mother   . COPD Mother   . Stroke Mother   . Neuropathy Mother   . Heart disease Father   . Stroke Father   . Cancer Brother   . Diabetes Brother   . Diabetes Brother   . Alcohol abuse Brother     SOCIAL HISTORY:  Social History   Socioeconomic History  . Marital status: Single    Spouse name: Not on file  . Number of children: Not on file  . Years of education: Not on file  . Highest education level: Not on file  Occupational History  . Not on file  Tobacco Use  . Smoking status: Former Smoker    Packs/day: 2.00    Years: 30.00    Pack years: 60.00    Quit date: 11/25/1998    Years since quitting: 21.3  . Smokeless tobacco: Never Used  Vaping  Use  . Vaping Use: Never used  Substance and Sexual Activity  . Alcohol use: No    Alcohol/week: 0.0 standard drinks  . Drug use: No  . Sexual activity: Not on file  Other Topics Concern  . Not on file  Social History Narrative  . Not on file   Social Determinants of Health   Financial Resource Strain:   . Difficulty of Paying Living Expenses:   Food Insecurity:   . Worried About Charity fundraiser in the Last Year:   . Arboriculturist in the Last Year:   Transportation Needs:   . Film/video editor (Medical):   Marland Kitchen Lack of Transportation (Non-Medical):   Physical Activity:   . Days of Exercise per Week:   . Minutes of Exercise per Session:   Stress:   . Feeling of Stress :   Social Connections:   . Frequency of Communication  with Friends and Family:   . Frequency of Social Gatherings with Friends and Family:   . Attends Religious Services:   . Active Member of Clubs or Organizations:   . Attends Archivist Meetings:   Marland Kitchen Marital Status:   Intimate Partner Violence:   . Fear of Current or Ex-Partner:   . Emotionally Abused:   Marland Kitchen Physically Abused:   . Sexually Abused:      PHYSICAL EXAM  Vitals:   04/10/20 1030  BP: (!) 95/55  Pulse: 81  Weight: (!) 226 lb 8 oz (102.7 kg)  Height: 5' (1.524 m)    Body mass index is 44.24 kg/m.   General: The patient is well-developed and well-nourished and in no acute distress.   She has Lymphedema and legs are wrapped   Neurologic Exam  Mental status: The patient is alert and oriented x 3 at the time of the examination. The patient has apparent normal recent and remote memory, with an apparently normal attention span and concentration ability.   Speech is normal.  Cranial nerves: Extraocular muscles are intact. Facial strength and sensation is normal.  The tongue is midline, and the patient has symmetric elevation of the soft palate. No obvious hearing deficits are noted.  Motor:  Mild essential tremor in  hands.  Muscle bulk is normal.   Tone is normal. Strength is normal in the arms. Strength is 4+/5 in the foot and ankle extensors  Sensory: She has reduced sensation to vibration at the ankles.  There is reduced sensation to touch from the ankles down.  Coordination: Cerebellar testing reveals good finger-nose-finger bilaterally.  Heel-to-shin is poor.  Gait and station: Station is normal.   Gait is mildly wide.  She is unable to do tandem walk.  Romberg is mildly positive.  Reflexes: Deep tendon reflexes are 1+ in arms and absent in knees and ankles.        ASSESSMENT AND PLAN    1. Obstructive sleep apnea   2. Polyneuropathy   3. Restless leg   4. End-stage renal disease on hemodialysis ([place])     1.   Neuropathy is doing well.   She will continue gabapentin and ropinirole for her neuropathic pain and restless leg syndrome.  She can take up to 400 mg gabapentin daily and up to 3 mg ropinirole.    Continue tramadol per Pain management Collier Flowers) 2.   She will continue CPAP +10cm .  She has benefited from CPAP for the treatment of her severe OSA (AHI = 44.7 in February 2015).  D/L shows much improved to 4.4.  3.   Return in 1 year or sooner if there are new or worsening neurologic symptoms.   Henretter Piekarski A. Felecia Shelling, MD, PhD, [facility]arlynn Grimes  0/11/4915, 91:50 AM Certified in Neurology, Clinical Neurophysiology, Sleep Medicine, Pain Medicine and Neuroimaging  [facility] [address] [place], [place] [address] [phone]

## 2020-04-17 ENCOUNTER — Telehealth: Payer: Self-pay | Admitting: *Deleted

## 2020-04-17 NOTE — Telephone Encounter (Signed)
Faxed signed cpap ordres to [facility] at [phone]. Received fax confirmation.

## 2020-05-18 ENCOUNTER — Telehealth: Payer: Self-pay | Admitting: *Deleted

## 2020-05-18 ENCOUNTER — Other Ambulatory Visit: Payer: Self-pay | Admitting: *Deleted

## 2020-05-18 MED ORDER — HEPARIN SODIUM (PORCINE) 5000 UNIT/ML IJ SOLN: 5000.0000 [IU] | Freq: Two times a day (BID) | INTRAMUSCULAR | 0 refills | Status: DC

## 2020-05-18 MED FILL — HEPARIN SOD 5,000 UNIT/ML V: 5000 | 2 days supply | Qty: 4 | Fill #0

## 2020-05-18 NOTE — Telephone Encounter (Signed)
Patient called stating that she is having surgery Sept 23 and in the past Dr Marin Olp has written a Rx for Heparin while she is off the Eliquis.  Talked with Dr Marin Olp.  Ok to write Rx for heparin.  Sent to Dr Marin Olp to sign

## 2020-05-18 NOTE — Telephone Encounter (Signed)
Patient called stating that she is having surgery on Sept 23 and Dr Marin Olp has prescripbed heparin injections while off of Eliquis in the past.  Ok with Dr Marin Olp to do this.  Rx send to Dr Marin Olp to sign.

## 2020-06-05 ENCOUNTER — Telehealth: Payer: Self-pay | Admitting: *Deleted

## 2020-06-05 NOTE — Telephone Encounter (Signed)
Call received from patient wanting to know if she should take Heparin post op instead of Eliquis.  Call placed back to patient and message left to notify her per order of Dr. Marin Olp that she should take Eliquis 5 mg twice a day post op starting the day after surgery.  Instructed pt to call office back with any further questions or concerns.

## 2020-07-12 ENCOUNTER — Inpatient Hospital Stay: Payer: Medicare Other | Admitting: Family

## 2020-07-12 ENCOUNTER — Inpatient Hospital Stay: Payer: Medicare Other

## 2020-07-13 ENCOUNTER — Ambulatory Visit: Payer: Medicare Other | Admitting: Hematology & Oncology

## 2020-07-13 ENCOUNTER — Other Ambulatory Visit: Payer: Medicare Other

## 2020-07-16 DIAGNOSIS — S71102A Unspecified open wound, left thigh, initial encounter: Secondary | ICD-10-CM | POA: Insufficient documentation

## 2020-08-24 ENCOUNTER — Telehealth: Payer: Self-pay | Admitting: Pulmonary Disease

## 2020-08-24 NOTE — Telephone Encounter (Signed)
We do not have any samples of Incruse at this time. Called and spoke with pt letting her know this info and she verbalized understanding. Nothing further needed.

## 2020-09-06 ENCOUNTER — Telehealth: Payer: Self-pay

## 2020-09-06 NOTE — Telephone Encounter (Signed)
Pt called in to r/s her appt from 12/27 to 12/28 due to dialysis...Marland KitchenAOM

## 2020-09-10 ENCOUNTER — Inpatient Hospital Stay: Payer: Medicare Other | Attending: Hematology & Oncology

## 2020-09-10 ENCOUNTER — Inpatient Hospital Stay: Payer: Medicare Other | Admitting: Family

## 2020-09-10 DIAGNOSIS — Z7901 Long term (current) use of anticoagulants: Secondary | ICD-10-CM | POA: Insufficient documentation

## 2020-09-10 DIAGNOSIS — Z86718 Personal history of other venous thrombosis and embolism: Secondary | ICD-10-CM | POA: Insufficient documentation

## 2020-09-10 DIAGNOSIS — Z992 Dependence on renal dialysis: Secondary | ICD-10-CM | POA: Insufficient documentation

## 2020-09-10 DIAGNOSIS — D6851 Activated protein C resistance: Secondary | ICD-10-CM | POA: Insufficient documentation

## 2020-09-10 DIAGNOSIS — Z79899 Other long term (current) drug therapy: Secondary | ICD-10-CM | POA: Insufficient documentation

## 2020-09-10 DIAGNOSIS — G629 Polyneuropathy, unspecified: Secondary | ICD-10-CM | POA: Insufficient documentation

## 2020-09-10 DIAGNOSIS — N186 End stage renal disease: Secondary | ICD-10-CM | POA: Insufficient documentation

## 2020-09-11 ENCOUNTER — Telehealth: Payer: Self-pay | Admitting: *Deleted

## 2020-09-11 ENCOUNTER — Inpatient Hospital Stay (HOSPITAL_BASED_OUTPATIENT_CLINIC_OR_DEPARTMENT_OTHER): Payer: Medicare Other | Admitting: Family

## 2020-09-11 ENCOUNTER — Encounter: Payer: Self-pay | Admitting: Family

## 2020-09-11 ENCOUNTER — Other Ambulatory Visit: Payer: Self-pay

## 2020-09-11 ENCOUNTER — Inpatient Hospital Stay: Payer: Medicare Other

## 2020-09-11 VITALS — BP 107/60 | HR 82 | Temp 98.5°F | Resp 18 | Ht 60.0 in | Wt 225.1 lb

## 2020-09-11 DIAGNOSIS — Z7901 Long term (current) use of anticoagulants: Secondary | ICD-10-CM

## 2020-09-11 DIAGNOSIS — D6851 Activated protein C resistance: Secondary | ICD-10-CM

## 2020-09-11 DIAGNOSIS — G629 Polyneuropathy, unspecified: Secondary | ICD-10-CM | POA: Diagnosis not present

## 2020-09-11 DIAGNOSIS — Z79899 Other long term (current) drug therapy: Secondary | ICD-10-CM | POA: Diagnosis not present

## 2020-09-11 DIAGNOSIS — N186 End stage renal disease: Secondary | ICD-10-CM | POA: Diagnosis not present

## 2020-09-11 DIAGNOSIS — D509 Iron deficiency anemia, unspecified: Secondary | ICD-10-CM

## 2020-09-11 DIAGNOSIS — I82A19 Acute embolism and thrombosis of unspecified axillary vein: Secondary | ICD-10-CM

## 2020-09-11 DIAGNOSIS — Z992 Dependence on renal dialysis: Secondary | ICD-10-CM | POA: Diagnosis not present

## 2020-09-11 DIAGNOSIS — Z86718 Personal history of other venous thrombosis and embolism: Secondary | ICD-10-CM | POA: Diagnosis not present

## 2020-09-11 LAB — CMP (CANCER CENTER ONLY)
ALT: 6 U/L (ref 0–44)
AST: 15 U/L (ref 15–41)
Albumin: 4.1 g/dL (ref 3.5–5.0)
Alkaline Phosphatase: 87 U/L (ref 38–126)
Anion gap: 25 — ABNORMAL HIGH (ref 5–15)
BUN: 37 mg/dL — ABNORMAL HIGH (ref 8–23)
CO2: 24 mmol/L (ref 22–32)
Calcium: 8.9 mg/dL (ref 8.9–10.3)
Chloride: 94 mmol/L — ABNORMAL LOW (ref 98–111)
Creatinine: 5.2 mg/dL (ref 0.44–1.00)
GFR, Estimated: 9 mL/min — ABNORMAL LOW (ref >60)
Glucose, Bld: 96 mg/dL (ref 70–99)
Potassium: 4.7 mmol/L (ref 3.5–5.1)
Sodium: 143 mmol/L (ref 135–145)
Total Bilirubin: 0.5 mg/dL (ref 0.3–1.2)
Total Protein: 6.8 g/dL (ref 6.5–8.1)

## 2020-09-11 LAB — CBC WITH DIFFERENTIAL (CANCER CENTER ONLY)
Abs Immature Granulocytes: 0.02 10*3/uL (ref 0.00–0.07)
Basophils Absolute: 0.1 10*3/uL (ref 0.0–0.1)
Basophils Relative: 1 %
Eosinophils Absolute: 0.4 10*3/uL (ref 0.0–0.5)
Eosinophils Relative: 7 %
HCT: 34.4 % — ABNORMAL LOW (ref 36.0–46.0)
Hemoglobin: 10.7 g/dL — ABNORMAL LOW (ref 12.0–15.0)
Immature Granulocytes: 0 %
Lymphocytes Relative: 16 %
Lymphs Abs: 1 10*3/uL (ref 0.7–4.0)
MCH: 28.5 pg (ref 26.0–34.0)
MCHC: 31.1 g/dL (ref 30.0–36.0)
MCV: 91.7 fL (ref 80.0–100.0)
Monocytes Absolute: 0.5 10*3/uL (ref 0.1–1.0)
Monocytes Relative: 8 %
Neutro Abs: 4.1 10*3/uL (ref 1.7–7.7)
Neutrophils Relative %: 68 %
Platelet Count: 144 10*3/uL — ABNORMAL LOW (ref 150–400)
RBC: 3.75 MIL/uL — ABNORMAL LOW (ref 3.87–5.11)
RDW: 17.3 % — ABNORMAL HIGH (ref 11.5–15.5)
WBC Count: 6 10*3/uL (ref 4.0–10.5)
nRBC: 0 % (ref 0.0–0.2)

## 2020-09-11 LAB — RETICULOCYTES
Immature Retic Fract: 10.5 % (ref 2.3–15.9)
RBC.: 3.72 MIL/uL — ABNORMAL LOW (ref 3.87–5.11)
Retic Count, Absolute: 117.9 10*3/uL (ref 19.0–186.0)
Retic Ct Pct: 3.2 % — ABNORMAL HIGH (ref 0.4–3.1)

## 2020-09-11 NOTE — Progress Notes (Signed)
Hematology and Oncology Follow Up Visit  Whitney Chavez [id number] 09/15/1956 64 y.o. 09/11/2020   Principle Diagnosis:  Recurrent thromboembolic disease Heterozygous factor V Leiden mutation Prothrombin II gene mutation Calciphylaxis End stage renal disease on hemodialysis   Current Therapy:        Eliquis 5 mg po BID - lifelong   Interim History:  Whitney Chavez is here today for follow-up. She is doing fairly well. She had surgery for wound due to calciphylaxis of the left inner thigh. Unfortunately her wound opened up and she had to have surgery to clean the area out and currently has a wound vac. She states that this has helped tremendously.  Now she states that she has another wound developing along her right l;ower abdomen. She is seen 3 days a week by a wound care nurse and states that she is also assessed 3 days a week by dialysis.  She states that she follows up again with her surgeon on 09/24/2020 for further eval.  She continues to go to dialysis M,W,F. She states that she has 20 lbs more to lose before she can be put on the transplant list. She no longer makes urine.  No fever, chills, n/v, cough, rash, dizziness, chest pain, palpitations, abdominal pain or changes in bowel or bladder habits.  + thrill and bruit with left upper arm AV fistula.  She fell in the donut hole last month and was not able to get her Incruse inhaler. She has noted a little increase in SOB as a result. She states that this has been tolerable.  She has not had any episodes of blood loss but does bruise very easily. No petechiae.  She verbalized that she is taking her Eliquis BID as prescribed.  She has neuropathy in her feet. She ambulates with a rolling walker for added support.  She has positional numbness and tingling in her hands id she sleeps a certain way.  She has lymphedema wraps on her lower extremities.  She states that with dialysis she has lost her appetite but is still making herself eat. She  hydrates properly on fluid restrictions. Her weight is stable at 226 lbs.   ECOG Performance Status: 1 - Symptomatic but completely ambulatory  Medications:  Allergies as of 09/11/2020      Reactions   Estrogens Other (See Comments)   Blood clots   Other Other (See Comments)   Unable to take birth control pills due to clotting disorder/fim   Prednisone Other (See Comments)   Facial edema - has had this 2020 and had no difficulty "can take in small doses"   Progesterone Other (See Comments)   Blood clots   Propofol Other (See Comments)   Legs started thrashing and skin tears.   Tape Rash   Please use paper tape      Medication List       Accurate as of September 11, 2020  2:42 PM. If you have any questions, ask your nurse or doctor.        acetaminophen 325 MG tablet Commonly known as: TYLENOL Take 325 mg by mouth daily as needed for headache (pain).   amiodarone 200 MG tablet Commonly known as: PACERONE Take 200 mg by mouth daily.   apixaban 5 MG Tabs tablet Commonly known as: Eliquis Take 1 tablet (5 mg total) by mouth 2 (two) times daily.   ARANESP IJ Inject as directed once a week. At dialysis center, dose varies.   atorvastatin 20 MG tablet  Commonly known as: LIPITOR Take 20 mg by mouth daily.   budesonide-formoterol 160-4.5 MCG/ACT inhaler Commonly known as: Symbicort Inhale 2 puffs into the lungs 2 (two) times daily.   buprenorphine 10 MCG/HR Ptwk Commonly known as: BUTRANS Place 1 patch onto the skin every 7 (seven) days.   cetirizine 10 MG tablet Commonly known as: ZYRTEC Take 10 mg by mouth daily.   gabapentin 100 MG capsule Commonly known as: NEURONTIN Take 1 capsule (100 mg total) by mouth 4 (four) times daily.   heparin 5000 UNIT/ML injection Inject 1 mL (5,000 Units total) into the skin 2 (two) times daily for 2 days. To be given SQ on Sept 20, Sept 21.   Incruse Ellipta 62.5 MCG/INH Aepb Generic drug: umeclidinium bromide Inhale 1 puff  into the lungs daily.   lanthanum 1000 MG chewable tablet Commonly known as: FOSRENOL Chew 250-500 mg by mouth See admin instructions. Chew 1 tablet (500 mg) by mouth three times daily with meals, chew 1/4 tablet (250 mg) with snacks   levalbuterol 0.63 MG/3ML nebulizer solution Commonly known as: Xopenex Take 3 mLs (0.63 mg total) by nebulization every 4 (four) hours as needed for wheezing or shortness of breath.   levalbuterol 45 MCG/ACT inhaler Commonly known as: XOPENEX HFA Inhale 2 puffs into the lungs every 6 (six) hours as needed for wheezing.   Medical Compression Stockings Misc by Does not apply route. Leg wraps.   Melatonin 10 MG Tabs Take 10-20 mg by mouth at bedtime as needed (sleep).   nortriptyline 10 MG capsule Commonly known as: PAMELOR Take 20 mg by mouth at bedtime.   omeprazole 20 MG capsule Commonly known as: PRILOSEC Take 20 mg by mouth daily as needed (acid reflux). Rarely takes   PARSABIV IV Inject 1 Dose into the vein 3 (three) times a week. Receives at Dialysis   PRESCRIPTION MEDICATION Inhale into the lungs at bedtime. CPAP with nasal pillow   RENAL VITAMIN PO Take 1 tablet by mouth daily.   rOPINIRole 0.5 MG tablet Commonly known as: Requip Take 1-2 tablets in the morning po.   rOPINIRole 2 MG tablet Commonly known as: REQUIP Take 1 tablet (2 mg total) by mouth at bedtime.   SODIUM THIOSULFATE IV Inject 1 Dose into the vein 3 (three) times a week. Receives at Dialysis   sulfaSALAzine 500 MG tablet Commonly known as: AZULFIDINE Take 1,000 mg by mouth 2 (two) times daily.   traMADol 50 MG tablet Commonly known as: ULTRAM Take 50 mg by mouth 2 (two) times daily as needed (pain).   triamcinolone 0.1 % Commonly known as: KENALOG APPLY CREAM EXTERNALLY ONCE DAILY AS NEEDED FOR 30 DAYS       Allergies:  Allergies  Allergen Reactions  . Estrogens Other (See Comments)    Blood clots  . Other Other (See Comments)    Unable to take  birth control pills due to clotting disorder/fim  . Prednisone Other (See Comments)    Facial edema - has had this 2020 and had no difficulty "can take in small doses"  . Progesterone Other (See Comments)    Blood clots  . Propofol Other (See Comments)    Legs started thrashing and skin tears.  . Tape Rash    Please use paper tape    Past Medical History, Surgical history, Social history, and Family History were reviewed and updated.  Review of Systems: All other 10 point review of systems is negative.   Physical Exam:  vitals  were not taken for this visit.   Wt Readings from Last 3 Encounters:  04/10/20 (!) 226 lb 8 oz (102.7 kg)  01/24/20 228 lb 6.4 oz (103.6 kg)  01/12/20 225 lb (102.1 kg)    Ocular: Sclerae unicteric, pupils equal, round and reactive to light Ear-nose-throat: Oropharynx clear, dentition fair Lymphatic: No cervical or supraclavicular adenopathy Lungs no rales or rhonchi, good excursion bilaterally Heart regular rate and rhythm, no murmur appreciated Abd soft, nontender, positive bowel sounds MSK no focal spinal tenderness, no joint edema Neuro: non-focal, well-oriented, appropriate affect Breasts: Deferred   Lab Results  Component Value Date   WBC 5.0 01/12/2020   HGB 9.0 (L) 01/12/2020   HCT 28.4 (L) 01/12/2020   MCV 102.2 (H) 01/12/2020   PLT 107 (L) 01/12/2020   Lab Results  Component Value Date   FERRITIN 1,599 (H) 01/12/2020   IRON 172 (H) 01/12/2020   TIBC 174 (L) 01/12/2020   UIBC 2 (L) 01/12/2020   IRONPCTSAT 99 (H) 01/12/2020   Lab Results  Component Value Date   RBC 2.78 (L) 01/12/2020   No results found for: KPAFRELGTCHN, LAMBDASER, KAPLAMBRATIO No results found for: IGGSERUM, IGA, IGMSERUM No results found for: Odetta Pink, SPEI   Chemistry      Component Value Date/Time   NA 139 01/12/2020 0851   NA 144 06/25/2017 1146   K 5.0 01/12/2020 0851   K 4.1 06/25/2017 1146    CL 96 (L) 01/12/2020 0851   CL 103 06/25/2017 1146   CO2 28 01/12/2020 0851   CO2 31 06/25/2017 1146   BUN 29 (H) 01/12/2020 0851   BUN 27 (H) 06/25/2017 1146   CREATININE 4.96 (HH) 01/12/2020 0851   CREATININE 4.7 (HH) 06/25/2017 1146      Component Value Date/Time   CALCIUM 8.8 (L) 01/12/2020 0851   CALCIUM 9.7 06/25/2017 1146   ALKPHOS 72 01/12/2020 0851   ALKPHOS 60 06/25/2017 1146   AST 19 01/12/2020 0851   ALT 8 01/12/2020 0851   ALT 14 06/25/2017 1146   BILITOT 0.8 01/12/2020 0851       Impression and Plan: Whitney Chavez is a very pleasant 64 yo caucasian female with history of recurrent thrombosis. She has both Factor V Leiden and prothrombin II gene mutation. She also has history of calciphylaxis.  She continues to do well on lifelong maintenance anticoagulation with Eliquis. Iron studies are pending.  Follow-up in 6 months.  She was encouraged to contact our office with any questions or concerns.   Laverna Peace, NP 12/28/20212:42 PM

## 2020-09-11 NOTE — Telephone Encounter (Signed)
Jory Ee NP notified of creat-5.20.  No new orders received at this time.

## 2020-09-12 LAB — IRON AND TIBC
Iron: 45 ug/dL (ref 41–142)
Saturation Ratios: 30 % (ref 21–57)
TIBC: 148 ug/dL — ABNORMAL LOW (ref 236–444)
UIBC: 103 ug/dL — ABNORMAL LOW (ref 120–384)

## 2020-09-12 LAB — FERRITIN: Ferritin: 971 ng/mL — ABNORMAL HIGH (ref 11–307)

## 2020-09-12 NOTE — Telephone Encounter (Signed)
appts have been made and printed/mailed to pt per 09/11/20 los..  AOM.

## 2020-10-19 ENCOUNTER — Other Ambulatory Visit: Payer: Self-pay | Admitting: *Deleted

## 2020-10-19 MED ORDER — APIXABAN 5 MG PO TABS: 5.0000 mg | ORAL_TABLET | Freq: Two times a day (BID) | ORAL | 12 refills | Status: DC

## 2020-12-19 ENCOUNTER — Other Ambulatory Visit: Payer: Self-pay

## 2020-12-19 DIAGNOSIS — D6851 Activated protein C resistance: Secondary | ICD-10-CM

## 2020-12-19 MED ORDER — ELIQUIS 5 MG PO TABS: 5.0000 mg | ORAL_TABLET | Freq: Two times a day (BID) | ORAL | 2 refills | Status: DC

## 2021-01-07 ENCOUNTER — Telehealth: Payer: Self-pay

## 2021-01-07 NOTE — Telephone Encounter (Signed)
Called pt back. Confirmed she has yearly follow up scheduled for 04/11/21 at 11am w/ Dr. Felecia Shelling. She verbalized understanding and appreciation for call

## 2021-01-07 NOTE — Telephone Encounter (Signed)
Pt left a voicemail asking for a call to discuss if she has scheduled her annual appt with Dr. Felecia Shelling.

## 2021-01-08 ENCOUNTER — Encounter: Payer: Self-pay | Admitting: Pulmonary Disease

## 2021-01-08 ENCOUNTER — Other Ambulatory Visit: Payer: Self-pay

## 2021-01-08 ENCOUNTER — Ambulatory Visit (INDEPENDENT_AMBULATORY_CARE_PROVIDER_SITE_OTHER): Payer: Medicare Other | Admitting: Pulmonary Disease

## 2021-01-08 VITALS — BP 122/78 | HR 74 | Temp 97.8°F | Ht 60.0 in | Wt 225.0 lb

## 2021-01-08 DIAGNOSIS — H5789 Other specified disorders of eye and adnexa: Secondary | ICD-10-CM

## 2021-01-08 DIAGNOSIS — J449 Chronic obstructive pulmonary disease, unspecified: Secondary | ICD-10-CM | POA: Diagnosis not present

## 2021-01-08 MED ORDER — BUDESONIDE-FORMOTEROL FUMARATE 160-4.5 MCG/ACT IN AERO: 2.0000 | INHALATION_SPRAY | Freq: Two times a day (BID) | RESPIRATORY_TRACT | 6 refills | Status: DC

## 2021-01-08 MED ORDER — YUPELRI 175 MCG/3ML IN SOLN
175.0000 ug | Freq: Every day | RESPIRATORY_TRACT | 0 refills | Status: DC
Start: 2021-01-08 — End: 2021-03-12

## 2021-01-08 MED ORDER — INCRUSE ELLIPTA 62.5 MCG/INH IN AEPB: 1.0000 | INHALATION_SPRAY | Freq: Every day | RESPIRATORY_TRACT | 5 refills | Status: DC

## 2021-01-08 NOTE — Patient Instructions (Addendum)
Moderately severe COPD (FEV1 58%) Asthma-COPD overlap --CONTINUE Symbicort TWO puffs TWICE a day --CONTINUE Incruse ONE puff a day --CONTINUE Albuterol TWO puffs as needed for shortness of breath or wheezing  Eye redness --CONTINUE eye drops from over the counter --If worsening redness, irritation or any new vision changes (blurriness) recommend urgent evaluation with Ophthalmology --Will trial Yupelri in place of Incruse. Please call our office if this is a better option for you  Follow-up with me in 6 months

## 2021-01-08 NOTE — Progress Notes (Signed)
@Patient  ID: Whitney Chavez, female    DOB: Jul 16, 1956, 65 y.o.   MRN: [id number]  Chief Complaint  Patient presents with  . Follow-up    Incruse working well.     Referring provider: Kathyrn Lass, MD  HPI: [name] is a 65 year old female former smoker (60 pack-years), recurrent thromboembolic disease secondary to Factor V Leiden mutation and prothrombin II on anticoagulation, ESRD on hemodialysis, lymphedema and OSA and hx calciphylaxis who presents for COPD follow-up. She was last seen in clinic with NP Volanda Napoleon in 01/2020. She was switched to Incruse and continued on Symbicort. I last saw her on 11/25/2018. She reports the Incruse is better than Spiriva however has noticed some eye redness bilaterally. She saw her optometrist last month and no change in her prescription and reports no glaucoma. On dialysis days she will have a productive cough with thick mucous. Denies fevers and chills.   PFTs 07/28/2019 FVC 1.59 (55%), FEV1 1.24 (56%), ratio 78, normal DLCO Moderate obstruction with positive BD response. Curvature to flow volume loop.   12/29/17 PFTs with MCT- FVC 1.73 (59%), FEV1 1.30 (58%), ratio 75 Moderate obstruction with positive hyper-reactive airway/ FEV1 declined by more than 20%  Allergies  Allergen Reactions  . Estrogens Other (See Comments)    Blood clots  . Other Other (See Comments)    Unable to take birth control pills due to clotting disorder/fim  . Prednisone Other (See Comments)    Facial edema - has had this 2020 and had no difficulty "can take in small doses"  . Progesterone Other (See Comments)    Blood clots  . Propofol Other (See Comments)    Legs started thrashing and skin tears.  . Tape Rash    Please use paper tape    Past Medical History:  Diagnosis Date  . Asthma   . Axillary vein thrombosis ([place]) 08/16/2015  . Calciphylaxis   . Chronic kidney disease   . Colitis   . COPD (chronic obstructive pulmonary disease) ([place])   . Hypertension    . Lymphedema    legs  . MRSA (methicillin resistant staph aureus) culture positive   . Neuropathy   . Obstructive sleep apnea 03/29/2015  . OSA on CPAP   . Vision abnormalities     Outpatient Medications Prior to Visit  Medication Sig Dispense Refill  . acetaminophen (TYLENOL) 325 MG tablet Take 325 mg by mouth daily as needed for headache (pain).     Marland Kitchen amiodarone (PACERONE) 200 MG tablet Take 200 mg by mouth daily.    Marland Kitchen apixaban (ELIQUIS) 5 MG TABS tablet Take 1 tablet (5 mg total) by mouth 2 (two) times daily. 180 tablet 2  . atorvastatin (LIPITOR) 20 MG tablet Take 20 mg by mouth daily.     . B Complex-C-Folic Acid (RENAL VITAMIN PO) Take 1 tablet by mouth daily.    . budesonide-formoterol (SYMBICORT) 160-4.5 MCG/ACT inhaler Inhale 2 puffs into the lungs 2 (two) times daily. 1 Inhaler 6  . cetirizine (ZYRTEC) 10 MG tablet Take 10 mg by mouth daily.    . Darbepoetin Alfa-Albumin (ARANESP IJ) Inject as directed once a week. At dialysis center, dose varies.    Regino Schultze Bandages & Supports (MEDICAL COMPRESSION STOCKINGS) MISC by Does not apply route. Leg wraps.    . Etelcalcetide HCl (PARSABIV IV) Inject 1 Dose into the vein 3 (three) times a week. Receives at Dialysis    . gabapentin (NEURONTIN) 100 MG capsule Take  1 capsule (100 mg total) by mouth 4 (four) times daily. 360 capsule 3  . lanthanum (FOSRENOL) 1000 MG chewable tablet Chew 250-500 mg by mouth See admin instructions. Chew 1 tablet (500 mg) by mouth three times daily with meals, chew 1/4 tablet (250 mg) with snacks    . levalbuterol (XOPENEX HFA) 45 MCG/ACT inhaler Inhale 2 puffs into the lungs every 6 (six) hours as needed for wheezing. 1 Inhaler 12  . levalbuterol (XOPENEX) 0.63 MG/3ML nebulizer solution Take 3 mLs (0.63 mg total) by nebulization every 4 (four) hours as needed for wheezing or shortness of breath. 75 mL 11  . Melatonin 10 MG TABS Take 10-20 mg by mouth at bedtime as needed (sleep).     . nortriptyline  (PAMELOR) 10 MG capsule Take 20 mg by mouth at bedtime.     Marland Kitchen omeprazole (PRILOSEC) 20 MG capsule Take 20 mg by mouth daily as needed (acid reflux). Rarely takes    . PRESCRIPTION MEDICATION Inhale into the lungs at bedtime. CPAP with nasal pillow    . rOPINIRole (REQUIP) 0.5 MG tablet Take 1-2 tablets in the morning po. 180 tablet 3  . rOPINIRole (REQUIP) 2 MG tablet Take 1 tablet (2 mg total) by mouth at bedtime. 90 tablet 3  . SODIUM THIOSULFATE IV Inject 1 Dose into the vein 3 (three) times a week. Receives at Dialysis    . sulfaSALAzine (AZULFIDINE) 500 MG tablet Take 1,000 mg by mouth 2 (two) times daily.     . traMADol (ULTRAM) 50 MG tablet Take 50 mg by mouth 2 (two) times daily as needed (pain).     . triamcinolone cream (KENALOG) 0.1 % APPLY CREAM EXTERNALLY ONCE DAILY AS NEEDED FOR 30 DAYS    . umeclidinium bromide (INCRUSE ELLIPTA) 62.5 MCG/INH AEPB Inhale 1 puff into the lungs daily. 30 each 5  . heparin 5000 UNIT/ML injection Inject 1 mL (5,000 Units total) into the skin 2 (two) times daily for 2 days. To be given SQ on Sept 20, Sept 21. 4 mL 0   No facility-administered medications prior to visit.    Review of Systems  Review of Systems  Constitutional: Negative for chills, diaphoresis, fever, malaise/fatigue and weight loss.  HENT: Negative for congestion.   Respiratory: Negative for cough, hemoptysis, sputum production, shortness of breath and wheezing.   Cardiovascular: Negative for chest pain, palpitations and leg swelling.     Physical Exam: General: Well-appearing, no acute distress HENT: [place], AT Eyes: EOMI, no scleral icterus Respiratory: Clear to auscultation bilaterally.  No crackles, wheezing or rales Cardiovascular: RRR, -M/R/G, no JVD Extremities:-Edema,-tenderness Neuro: AAO x4, CNII-XII grossly intact Psych: Normal mood, normal affect   Lab Results:  CBC    Component Value Date/Time   WBC 6.0 09/11/2020 1529   WBC 6.8 05/24/2018 2123   RBC 3.72  (L) 09/11/2020 1530   RBC 3.75 (L) 09/11/2020 1529   HGB 10.7 (L) 09/11/2020 1529   HGB 9.9 (L) 06/25/2017 1146   HCT 34.4 (L) 09/11/2020 1529   HCT 32.3 (L) 06/25/2017 1146   PLT 144 (L) 09/11/2020 1529   PLT 202 06/25/2017 1146   MCV 91.7 09/11/2020 1529   MCV 92 06/25/2017 1146   MCH 28.5 09/11/2020 1529   MCHC 31.1 09/11/2020 1529   RDW 17.3 (H) 09/11/2020 1529   RDW 16.4 (H) 06/25/2017 1146   LYMPHSABS 1.0 09/11/2020 1529   LYMPHSABS 1.2 06/25/2017 1146   MONOABS 0.5 09/11/2020 1529   EOSABS 0.4 09/11/2020 1529  EOSABS 0.2 06/25/2017 1146   BASOSABS 0.1 09/11/2020 1529   BASOSABS 0.0 06/25/2017 1146    BMET    Component Value Date/Time   NA 143 09/11/2020 1529   NA 144 06/25/2017 1146   K 4.7 09/11/2020 1529   K 4.1 06/25/2017 1146   CL 94 (L) 09/11/2020 1529   CL 103 06/25/2017 1146   CO2 24 09/11/2020 1529   CO2 31 06/25/2017 1146   GLUCOSE 96 09/11/2020 1529   GLUCOSE 119 (H) 06/25/2017 1146   BUN 37 (H) 09/11/2020 1529   BUN 27 (H) 06/25/2017 1146   CREATININE 5.20 (HH) 09/11/2020 1529   CREATININE 4.7 (HH) 06/25/2017 1146   CALCIUM 8.9 09/11/2020 1529   CALCIUM 9.7 06/25/2017 1146   GFRNONAA 9 (L) 09/11/2020 1529   GFRAA 10 (L) 01/12/2020 0851    BNP    Component Value Date/Time   BNP 434.6 (H) 10/23/2017 1058    ProBNP No results found for: PROBNP  Imaging: No results found.   Assessment & Plan:   No problem-specific Assessment & Plan notes found for this encounter.  Moderately severe COPD (FEV1 58%) Asthma-COPD overlap --CONTINUE Symbicort TWO puffs TWICE a day --CONTINUE Incruse ONE puff a day --CONTINUE Albuterol TWO puffs as needed for shortness of breath or wheezing  Eye redness - new problem --CONTINUE eye drops from over the counter --If worsening redness, irritation or any new vision changes (blurriness) recommend urgent evaluation with Ophthalmology --Will trial Yupelri in place of Incruse. Please call our office if this is  a better option for you  Immunization History  Administered Date(s) Administered  . Influenza-Unspecified 06/22/2015, 06/25/2016, 06/30/2019  . Moderna Sars-Covid-2 Vaccination 12/01/2019, 12/29/2019  . Pneumococcal Conjugate-13 11/27/2014  . Pneumococcal Polysaccharide-23 07/11/2016  . Zoster Recombinat (Shingrix) 04/11/2017, 08/17/2017   CT Lung screen - not qualified due to >15 years after smoking  No orders of the defined types were placed in this encounter.  Meds ordered this encounter  Medications  . budesonide-formoterol (SYMBICORT) 160-4.5 MCG/ACT inhaler    Sig: Inhale 2 puffs into the lungs 2 (two) times daily.    Dispense:  1 each    Refill:  6  . DISCONTD: umeclidinium bromide (INCRUSE ELLIPTA) 62.5 MCG/INH AEPB    Sig: Inhale 1 puff into the lungs daily.    Dispense:  30 each    Refill:  5  . revefenacin (YUPELRI) 175 MCG/3ML nebulizer solution    Sig: Take 3 mLs (175 mcg total) by nebulization daily.    Dispense:  90 mL    Refill:  0    Order Specific Question:   Lot Number?    Answer:   [id number]   Return in about 6 months (around 07/10/2021).  Rodman Pickle, M.D. [facility] Pulmonary/Critical Care Medicine 01/08/2021 11:44 AM

## 2021-01-11 ENCOUNTER — Other Ambulatory Visit: Payer: Self-pay | Admitting: Primary Care

## 2021-01-21 ENCOUNTER — Encounter (HOSPITAL_BASED_OUTPATIENT_CLINIC_OR_DEPARTMENT_OTHER): Payer: Self-pay | Admitting: *Deleted

## 2021-01-21 ENCOUNTER — Emergency Department (HOSPITAL_BASED_OUTPATIENT_CLINIC_OR_DEPARTMENT_OTHER): Payer: Medicare Other

## 2021-01-21 ENCOUNTER — Other Ambulatory Visit: Payer: Self-pay

## 2021-01-21 ENCOUNTER — Emergency Department (HOSPITAL_BASED_OUTPATIENT_CLINIC_OR_DEPARTMENT_OTHER)
Admission: EM | Admit: 2021-01-21 | Discharge: 2021-01-21 | Disposition: A | Discharge status: Home / Self Care | Payer: Medicare Other | Attending: Emergency Medicine | Admitting: Emergency Medicine

## 2021-01-21 DIAGNOSIS — Z87891 Personal history of nicotine dependence: Secondary | ICD-10-CM | POA: Insufficient documentation

## 2021-01-21 DIAGNOSIS — Z7901 Long term (current) use of anticoagulants: Secondary | ICD-10-CM | POA: Insufficient documentation

## 2021-01-21 DIAGNOSIS — M79661 Pain in right lower leg: Secondary | ICD-10-CM | POA: Diagnosis not present

## 2021-01-21 DIAGNOSIS — I120 Hypertensive chronic kidney disease with stage 5 chronic kidney disease or end stage renal disease: Secondary | ICD-10-CM | POA: Insufficient documentation

## 2021-01-21 DIAGNOSIS — J449 Chronic obstructive pulmonary disease, unspecified: Secondary | ICD-10-CM | POA: Diagnosis not present

## 2021-01-21 DIAGNOSIS — J45909 Unspecified asthma, uncomplicated: Secondary | ICD-10-CM | POA: Insufficient documentation

## 2021-01-21 DIAGNOSIS — N186 End stage renal disease: Secondary | ICD-10-CM | POA: Diagnosis not present

## 2021-01-21 DIAGNOSIS — Z7951 Long term (current) use of inhaled steroids: Secondary | ICD-10-CM | POA: Diagnosis not present

## 2021-01-21 DIAGNOSIS — Z992 Dependence on renal dialysis: Secondary | ICD-10-CM | POA: Diagnosis not present

## 2021-01-21 DIAGNOSIS — M79604 Pain in right leg: Secondary | ICD-10-CM

## 2021-01-21 NOTE — ED Provider Notes (Signed)
[facility] EMERGENCY DEPARTMENT Provider Note   CSN: [id number] Arrival date & time: 01/21/21  1328     History Chief Complaint  Patient presents with  . Leg Pain    Whitney Chavez is a 65 y.o. female.  HPI Patient is a 65 year old female with a history of lymphedema, hypertension, OSA, calciphylaxis, ESRD on HD last dialyzed this morning, anticoagulated on Eliquis, who presents to the emergency department due to right lower leg pain.  Patient reports a history of fibular fracture in the past.  She states 1 week ago she was getting in and out of a chair frequently and began having pain along the right fibula where she had her previous fracture.  She states that her pain worsens with leg movement as well as ambulation.  Her pain persisted for the past week so she came to the emergency department for x-rays.  She does have a history of lymphedema and denies any unilateral swelling in the right leg.  She states she is anticoagulated on Eliquis and has not missed any doses of Eliquis.    Past Medical History:  Diagnosis Date  . Asthma   . Axillary vein thrombosis ([place]) 08/16/2015  . Calciphylaxis   . Chronic kidney disease   . Colitis   . COPD (chronic obstructive pulmonary disease) ([place])   . Hypertension   . Lymphedema    legs  . MRSA (methicillin resistant staph aureus) culture positive   . Neuropathy   . Obstructive sleep apnea 03/29/2015  . OSA on CPAP   . Vision abnormalities     Patient Active Problem List   Diagnosis Date Noted  . Essential tremor 04/10/2020  . Calciphylaxis 08/03/2018  . Flatfoot 04/01/2018  . Chronic pain syndrome 11/12/2017  . Neuropathic pain 11/12/2017  . Influenza A 10/24/2017  . COPD, moderate ([place]) 10/23/2017  . Hypoxia 10/23/2017  . 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  . Open abdominal wall wound 10/23/2017  . Colitis 10/23/2017  . Normocytic anemia 10/23/2017  . COPD exacerbation ([place]) 10/23/2017  . Moderate major depression, single  episode ([place]) 07/16/2017  . Encounter for peritoneal dialysis catheter insertion ([place]) 06/18/2017  . Panniculitis 04/28/2017  . Long term current use of anticoagulant 01/17/2016  . Factor V Leiden mutation ([place]) 01/17/2016  . Axillary vein thrombosis ([place]) 08/16/2015  . Obstructive sleep apnea 03/29/2015  . Factor 5 Leiden mutation, heterozygous ([place]) 03/29/2015  . Insomnia 03/29/2015  . Polyneuropathy 03/29/2015  . Restless leg 03/29/2015  . Gait disturbance 03/29/2015  . Benign hypertension with end-stage renal disease ([place]) 03/01/2015  . Airway hyperreactivity 08/19/2013  . Morbid (severe) obesity due to excess calories ([place]) 08/19/2013  . Apnea, sleep 08/19/2013  . Morbid obesity with BMI of 45.0-49.9, adult ([place]) 08/19/2013  . End-stage renal disease on hemodialysis ([place]) 08/18/2013    Past Surgical History:  Procedure Laterality Date  . APPLICATION OF WOUND VAC    . AV FISTULA PLACEMENT Left   . lumbar decompression fusion    . TONSILLECTOMY       OB History   No obstetric history on file.     Family History  Problem Relation Age of Onset  . Diabetes Mother   . Congestive Heart Failure Mother   . COPD Mother   . Stroke Mother   . Neuropathy Mother   . Heart disease Father   . Stroke Father   . Cancer Brother   . Diabetes Brother   . Diabetes Brother   . Alcohol abuse  Brother     Social History   Tobacco Use  . Smoking status: Former Smoker    Packs/day: 2.00    Years: 30.00    Pack years: 60.00    Quit date: 11/25/1998    Years since quitting: 22.1  . Smokeless tobacco: Never Used  Vaping Use  . Vaping Use: Never used  Substance Use Topics  . Alcohol use: No    Alcohol/week: 0.0 standard drinks  . Drug use: No    Home Medications Prior to Admission medications   Medication Sig Start Date End Date Taking? Authorizing Provider  acetaminophen (TYLENOL) 325 MG tablet Take 325 mg by mouth daily as needed for headache (pain).     [provider]  amiodarone (PACERONE) 200 MG tablet Take 200 mg by mouth daily. 12/28/18   [provider]  apixaban (ELIQUIS) 5 MG TABS tablet Take 1 tablet (5 mg total) by mouth 2 (two) times daily. 12/19/20   Volanda Napoleon, MD  atorvastatin (LIPITOR) 20 MG tablet Take 20 mg by mouth daily.  01/03/19   [provider]  B Complex-C-Folic Acid (RENAL VITAMIN PO) Take 1 tablet by mouth daily.    [provider]  budesonide-formoterol (SYMBICORT) 160-4.5 MCG/ACT inhaler Inhale 2 puffs into the lungs 2 (two) times daily. 01/08/21   Margaretha Seeds, MD  cetirizine (ZYRTEC) 10 MG tablet Take 10 mg by mouth daily.    [provider]  Darbepoetin Alfa-Albumin (ARANESP IJ) Inject as directed once a week. At dialysis center, dose varies.    [provider]  Elastic Bandages & Supports ([place]) MISC by Does not apply route. Leg wraps.    [provider]  Etelcalcetide HCl (PARSABIV IV) Inject 1 Dose into the vein 3 (three) times a week. Receives at Dialysis    [provider]  gabapentin (NEURONTIN) 100 MG capsule Take 1 capsule (100 mg total) by mouth 4 (four) times daily. 04/10/20   Sater, Nanine Means, MD  heparin 5000 UNIT/ML injection Inject 1 mL (5,000 Units total) into the skin 2 (two) times daily for 2 days. To be given SQ on Sept 20, Sept 21. 05/18/20 05/20/20  Volanda Napoleon, MD  INCRUSE ELLIPTA 62.5 MCG/INH AEPB TAKE 1 PUFF BY MOUTH EVERY DAY 01/11/21   Margaretha Seeds, MD  lanthanum (FOSRENOL) 1000 MG chewable tablet Chew 250-500 mg by mouth See admin instructions. Chew 1 tablet (500 mg) by mouth three times daily with meals, chew 1/4 tablet (250 mg) with snacks 12/04/16   [provider]  levalbuterol (XOPENEX HFA) 45 MCG/ACT inhaler Inhale 2 puffs into the lungs every 6 (six) hours as needed for wheezing. 07/28/19   Martyn Ehrich, NP  levalbuterol Penne Lash) 0.63 MG/3ML nebulizer solution Take 3 mLs (0.63  mg total) by nebulization every 4 (four) hours as needed for wheezing or shortness of breath. 08/09/19   Martyn Ehrich, NP  Melatonin 10 MG TABS Take 10-20 mg by mouth at bedtime as needed (sleep).     [provider]  nortriptyline (PAMELOR) 10 MG capsule Take 20 mg by mouth at bedtime.     [provider]  omeprazole (PRILOSEC) 20 MG capsule Take 20 mg by mouth daily as needed (acid reflux). Rarely takes    [provider]  PRESCRIPTION MEDICATION Inhale into the lungs at bedtime. CPAP with nasal pillow    [provider]  revefenacin (YUPELRI) 175 MCG/3ML nebulizer solution Take 3 mLs (175  mcg total) by nebulization daily. 01/08/21   Margaretha Seeds, MD  rOPINIRole (REQUIP) 0.5 MG tablet Take 1-2 tablets in the morning po. 04/10/20   Sater, Nanine Means, MD  rOPINIRole (REQUIP) 2 MG tablet Take 1 tablet (2 mg total) by mouth at bedtime. 04/10/20   Sater, Nanine Means, MD  SODIUM THIOSULFATE IV Inject 1 Dose into the vein 3 (three) times a week. Receives at Dialysis    [provider]  sulfaSALAzine (AZULFIDINE) 500 MG tablet Take 1,000 mg by mouth 2 (two) times daily.  02/15/15   [provider]  traMADol (ULTRAM) 50 MG tablet Take 50 mg by mouth 2 (two) times daily as needed (pain).     [provider]  triamcinolone cream (KENALOG) 0.1 % APPLY CREAM EXTERNALLY ONCE DAILY AS NEEDED FOR 30 DAYS 08/31/18   [provider]    Allergies    Estrogens, Other, Prednisone, Progesterone, Propofol, and Tape  Review of Systems   Review of Systems  Cardiovascular: Positive for leg swelling (Lymphedema).  Musculoskeletal: Positive for myalgias.  Skin: Negative for color change and wound.  Neurological: Negative for weakness and numbness.    Physical Exam Updated Vital Signs BP (!) 128/53   Pulse 74   Temp 98.3 F (36.8 C) (Oral)   Resp 20   Ht 5' (1.524 m)   Wt 98.9 kg   SpO2 96%   BMI 42.58 kg/m   Physical Exam Vitals  and nursing note reviewed.  Constitutional:      General: She is not in acute distress.    Appearance: She is well-developed.  HENT:     Head: Normocephalic and atraumatic.     Right Ear: External ear normal.     Left Ear: External ear normal.  Eyes:     General: No scleral icterus.       Right eye: No discharge.        Left eye: No discharge.     Conjunctiva/sclera: Conjunctivae normal.  Neck:     Trachea: No tracheal deviation.  Cardiovascular:     Rate and Rhythm: Normal rate.  Pulmonary:     Effort: Pulmonary effort is normal. No respiratory distress.     Breath sounds: No stridor.  Abdominal:     General: There is no distension.  Musculoskeletal:        General: Swelling and tenderness present. No deformity.     Cervical back: Neck supple.     Comments: Symmetrical 1+ edema noted in the lower extremities.  Mild tenderness appreciated over the right anterolateral lower leg along the fibular region.  No calf pain.  No palpable cords.  2+ DP pulses noted bilaterally.  Strength is 5 out of 5 with plantar/dorsi flexion in the feet.  Distal sensation intact.  Skin:    General: Skin is warm and dry.     Findings: No rash.  Neurological:     Mental Status: She is alert.     Cranial Nerves: Cranial nerve deficit: no gross deficits.     ED Results / Procedures / Treatments   Labs (all labs ordered are listed, but only abnormal results are displayed) Labs Reviewed - No data to display  EKG None  Radiology DG Tibia/Fibula Right  Result Date: 01/21/2021 CLINICAL DATA:  Pain EXAM: RIGHT TIBIA AND FIBULA - 2 VIEW COMPARISON:  None. FINDINGS: Frontal and lateral views were obtained. There is evidence of a prior fracture of the mid right fibula with bony remodeling. No  acute fracture or dislocation. There is osteoarthritic change in the right knee joint laterally. There is a spur along the inferior calcaneus. There are multiple vascular phleboliths. IMPRESSION: Prior fracture right  mid fibula with benign-appearing periosteal reaction. Underlying osteoporosis. No acute fracture or dislocation. Osteoarthritic change in the right knee laterally. Inferior calcaneal spur noted. Electronically Signed   By: Lowella Grip III M.D.   On: 01/21/2021 14:24   Procedures Procedures   Medications Ordered in ED Medications - No data to display  ED Course  I have reviewed the triage vital signs and the nursing notes.  Pertinent labs & imaging results that were available during my care of the patient were reviewed by me and considered in my medical decision making (see chart for details).    MDM Rules/Calculators/A&P                          Patient is a 65 year old female who presents to the emergency department due to right lower leg pain.  Patient has palpable pain along the right fibula.  No significant edema or overlying skin changes noted in the region.  Prior fracture to the right fibula.  No unilateral edema.  Patient is anticoagulated on Eliquis and denies missing any doses.  Doubt DVT at this time.  X-rays obtained of the region which showed no acute fracture or dislocation.  This was discussed with the patient and she seemed quite relieved.  She is ambulatory with a walker and neurovascularly intact in the legs.    She is going to follow-up with her PCP regarding her symptoms.  Discussed multiple OTC medications for management of her symptoms.  Feel that she is stable for discharge at this time and she is agreeable.  Her questions were answered and she was amicable at the time of discharge.  Final Clinical Impression(s) / ED Diagnoses Final diagnoses:  Right leg pain    Rx / DC Orders ED Discharge Orders    None       Rayna Sexton, PA-C 01/21/21 1633    Luna Fuse, MD 01/25/21 [phone]

## 2021-01-21 NOTE — ED Triage Notes (Signed)
C/o right lower leg pain x 1 week ? Injury

## 2021-01-21 NOTE — Discharge Instructions (Addendum)
Please follow-up with your regular doctor if you find your symptoms have not began to improve in the next 1 to 2 weeks.  You develop worsening symptoms such as leg swelling, redness, fevers, please return to the emergency department for evaluation.  It was a pleasure to meet you.

## 2021-01-28 DIAGNOSIS — H5789 Other specified disorders of eye and adnexa: Secondary | ICD-10-CM | POA: Insufficient documentation

## 2021-02-03 ENCOUNTER — Other Ambulatory Visit: Payer: Self-pay | Admitting: Primary Care

## 2021-02-27 ENCOUNTER — Telehealth: Payer: Self-pay | Admitting: Pharmacist

## 2021-02-27 NOTE — Telephone Encounter (Signed)
Received a fax from  [place] regarding an approval for  Symbicort  patient assistance from 02/21/21 to 09/14/21.  Patient provided with phone number to order refills.  Phone number: [phone]   Still awaiting response from [place] for Incruse Ellipta PAP.  Patient requested call when there is an update.  She states she submitted paperwork last week.  Knox Saliva, PharmD, MPH Clinical Pharmacist (Rheumatology and Pulmonology)

## 2021-03-12 ENCOUNTER — Encounter: Payer: Self-pay | Admitting: Hematology & Oncology

## 2021-03-12 ENCOUNTER — Inpatient Hospital Stay: Payer: Medicare Other | Attending: Hematology & Oncology

## 2021-03-12 ENCOUNTER — Other Ambulatory Visit (HOSPITAL_COMMUNITY): Payer: Self-pay

## 2021-03-12 ENCOUNTER — Other Ambulatory Visit: Payer: Self-pay

## 2021-03-12 ENCOUNTER — Telehealth: Payer: Self-pay

## 2021-03-12 ENCOUNTER — Telehealth: Payer: Self-pay | Admitting: *Deleted

## 2021-03-12 ENCOUNTER — Inpatient Hospital Stay (HOSPITAL_BASED_OUTPATIENT_CLINIC_OR_DEPARTMENT_OTHER): Payer: Medicare Other | Admitting: Hematology & Oncology

## 2021-03-12 VITALS — BP 107/67 | HR 70 | Temp 98.2°F | Resp 18 | Wt 220.0 lb

## 2021-03-12 DIAGNOSIS — Z7901 Long term (current) use of anticoagulants: Secondary | ICD-10-CM

## 2021-03-12 DIAGNOSIS — D509 Iron deficiency anemia, unspecified: Secondary | ICD-10-CM

## 2021-03-12 DIAGNOSIS — Z7951 Long term (current) use of inhaled steroids: Secondary | ICD-10-CM | POA: Diagnosis not present

## 2021-03-12 DIAGNOSIS — Z79899 Other long term (current) drug therapy: Secondary | ICD-10-CM | POA: Diagnosis not present

## 2021-03-12 DIAGNOSIS — Z86718 Personal history of other venous thrombosis and embolism: Secondary | ICD-10-CM | POA: Diagnosis not present

## 2021-03-12 DIAGNOSIS — G4733 Obstructive sleep apnea (adult) (pediatric): Secondary | ICD-10-CM

## 2021-03-12 DIAGNOSIS — I82A19 Acute embolism and thrombosis of unspecified axillary vein: Secondary | ICD-10-CM

## 2021-03-12 DIAGNOSIS — D649 Anemia, unspecified: Secondary | ICD-10-CM | POA: Diagnosis not present

## 2021-03-12 DIAGNOSIS — D6851 Activated protein C resistance: Secondary | ICD-10-CM | POA: Diagnosis present

## 2021-03-12 LAB — CMP (CANCER CENTER ONLY)
ALT: 6 U/L (ref 0–44)
AST: 16 U/L (ref 15–41)
Albumin: 4.3 g/dL (ref 3.5–5.0)
Alkaline Phosphatase: 100 U/L (ref 38–126)
Anion gap: 23 — ABNORMAL HIGH (ref 5–15)
BUN: 40 mg/dL — ABNORMAL HIGH (ref 8–23)
CO2: 24 mmol/L (ref 22–32)
Calcium: 9.2 mg/dL (ref 8.9–10.3)
Chloride: 92 mmol/L — ABNORMAL LOW (ref 98–111)
Creatinine: 4.76 mg/dL (ref 0.44–1.00)
GFR, Estimated: 10 mL/min — ABNORMAL LOW (ref >60)
Glucose, Bld: 84 mg/dL (ref 70–99)
Potassium: 4.9 mmol/L (ref 3.5–5.1)
Sodium: 139 mmol/L (ref 135–145)
Total Bilirubin: 0.7 mg/dL (ref 0.3–1.2)
Total Protein: 6.8 g/dL (ref 6.5–8.1)

## 2021-03-12 LAB — CBC WITH DIFFERENTIAL (CANCER CENTER ONLY)
Abs Immature Granulocytes: 0.01 10*3/uL (ref 0.00–0.07)
Basophils Absolute: 0 10*3/uL (ref 0.0–0.1)
Basophils Relative: 1 %
Eosinophils Absolute: 0.2 10*3/uL (ref 0.0–0.5)
Eosinophils Relative: 4 %
HCT: 35.4 % — ABNORMAL LOW (ref 36.0–46.0)
Hemoglobin: 11.3 g/dL — ABNORMAL LOW (ref 12.0–15.0)
Immature Granulocytes: 0 %
Lymphocytes Relative: 15 %
Lymphs Abs: 0.7 10*3/uL (ref 0.7–4.0)
MCH: 29.7 pg (ref 26.0–34.0)
MCHC: 31.9 g/dL (ref 30.0–36.0)
MCV: 92.9 fL (ref 80.0–100.0)
Monocytes Absolute: 0.4 10*3/uL (ref 0.1–1.0)
Monocytes Relative: 9 %
Neutro Abs: 3.3 10*3/uL (ref 1.7–7.7)
Neutrophils Relative %: 71 %
Platelet Count: 114 10*3/uL — ABNORMAL LOW (ref 150–400)
RBC: 3.81 MIL/uL — ABNORMAL LOW (ref 3.87–5.11)
RDW: 16.4 % — ABNORMAL HIGH (ref 11.5–15.5)
WBC Count: 4.7 10*3/uL (ref 4.0–10.5)
nRBC: 0 % (ref 0.0–0.2)

## 2021-03-12 LAB — IRON AND TIBC
Iron: 49 ug/dL (ref 28–170)
Saturation Ratios: 31 % (ref 10.4–31.8)
TIBC: 159 ug/dL — ABNORMAL LOW (ref 250–450)
UIBC: 110 ug/dL

## 2021-03-12 LAB — RETICULOCYTES
Immature Retic Fract: 10.7 % (ref 2.3–15.9)
RBC.: 3.79 MIL/uL — ABNORMAL LOW (ref 3.87–5.11)
Retic Count, Absolute: 120.5 10*3/uL (ref 19.0–186.0)
Retic Ct Pct: 3.2 % — ABNORMAL HIGH (ref 0.4–3.1)

## 2021-03-12 LAB — FERRITIN: Ferritin: 852 ng/mL — ABNORMAL HIGH (ref 11–307)

## 2021-03-12 MED ORDER — HEPARIN SODIUM (PORCINE) 5000 UNIT/ML IJ SOLN
5000.0000 [IU] | Freq: Two times a day (BID) | INTRAMUSCULAR | 0 refills | Status: DC
  Filled 2021-03-12: qty 6, 3d supply, fill #0

## 2021-03-12 NOTE — Progress Notes (Signed)
Hematology and Oncology Follow Up Visit  Whitney Chavez [id number] 01-09-1956 65 y.o. 03/12/2021   Principle Diagnosis:  Recurrent thromboembolic disease Heterozygous factor V Leiden mutation Prothrombin II gene mutation Calciphylaxis  Current Therapy:   Eliquis 5 mg po BID     Interim History:  Whitney Chavez is back for follow-up.  I saw her a year ago.  She was seen 6 months ago by Whitney Chavez.  She is going to have surgery on July 7.  She has calciphylaxis.  She has wounds on her skin that are opened up and.  She does have surgery to remove all this.  She is on Eliquis.  She will go on a heparin for 3 days before surgery.  We will send in the heparin for her.  She is on dialysis.  She is doing well on dialysis.  She has had no problems with the dialysis.  She has had no fever.  She has had no nausea or vomiting.  There is been no probably cough or shortness of breath.  Overall, she has avoided the coronavirus.  Her performance status right now is about ECOG 2.     Medications:  Current Outpatient Medications:    acetaminophen (TYLENOL) 325 MG tablet, Take 325 mg by mouth daily as needed for headache (pain). , Disp: , Rfl:    amiodarone (PACERONE) 200 MG tablet, Take 200 mg by mouth daily., Disp: , Rfl:    apixaban (ELIQUIS) 5 MG TABS tablet, Take 1 tablet (5 mg total) by mouth 2 (two) times daily., Disp: 180 tablet, Rfl: 2   atorvastatin (LIPITOR) 20 MG tablet, Take 20 mg by mouth daily. , Disp: , Rfl:    B Complex-C-Folic Acid (RENAL VITAMIN PO), Take 1 tablet by mouth daily., Disp: , Rfl:    budesonide-formoterol (SYMBICORT) 160-4.5 MCG/ACT inhaler, Inhale 2 puffs into the lungs 2 (two) times daily., Disp: 1 each, Rfl: 6   cetirizine (ZYRTEC) 10 MG tablet, Take 10 mg by mouth daily., Disp: , Rfl:    Darbepoetin Alfa-Albumin (ARANESP IJ), Inject as directed once a week. At dialysis center, dose varies., Disp: , Rfl:    Elastic Bandages & Supports ([place]) MISC,  by Does not apply route. Leg wraps., Disp: , Rfl:    Etelcalcetide HCl (PARSABIV IV), Inject 1 Dose into the vein 3 (three) times a week. Receives at Dialysis, Disp: , Rfl:    gabapentin (NEURONTIN) 100 MG capsule, Take 1 capsule (100 mg total) by mouth 4 (four) times daily., Disp: 360 capsule, Rfl: 3   heparin 5000 UNIT/ML injection, Inject 1 mL (5,000 Units total) into the skin 2 (two) times daily for 2 days. To be given SQ on Sept 20, Sept 21., Disp: 4 mL, Rfl: 0   INCRUSE ELLIPTA 62.5 MCG/INH AEPB, TAKE 1 PUFF BY MOUTH EVERY DAY, Disp: 90 each, Rfl: 1   lanthanum (FOSRENOL) 1000 MG chewable tablet, Chew 250-500 mg by mouth See admin instructions. Chew 1 tablet (500 mg) by mouth three times daily with meals, chew 1/4 tablet (250 mg) with snacks, Disp: , Rfl:    levalbuterol (XOPENEX HFA) 45 MCG/ACT inhaler, Inhale 2 puffs into the lungs every 6 (six) hours as needed for wheezing., Disp: 1 Inhaler, Rfl: 12   levalbuterol (XOPENEX) 0.63 MG/3ML nebulizer solution, Take 3 mLs (0.63 mg total) by nebulization every 4 (four) hours as needed for wheezing or shortness of breath., Disp: 75 mL, Rfl: 11   nortriptyline (PAMELOR) 10 MG capsule, Take 20  mg by mouth at bedtime. , Disp: , Rfl:    omeprazole (PRILOSEC) 20 MG capsule, Take 20 mg by mouth daily as needed (acid reflux). Rarely takes, Disp: , Rfl:    PRESCRIPTION MEDICATION, Inhale into the lungs at bedtime. CPAP with nasal pillow, Disp: , Rfl:    rOPINIRole (REQUIP) 0.5 MG tablet, Take 1-2 tablets in the morning po., Disp: 180 tablet, Rfl: 3   rOPINIRole (REQUIP) 2 MG tablet, Take 1 tablet (2 mg total) by mouth at bedtime., Disp: 90 tablet, Rfl: 3   SODIUM THIOSULFATE IV, Inject 1 Dose into the vein 3 (three) times a week. Receives at Dialysis, Disp: , Rfl:    sulfaSALAzine (AZULFIDINE) 500 MG tablet, Take 1,000 mg by mouth 2 (two) times daily. , Disp: , Rfl:    traMADol (ULTRAM) 50 MG tablet, Take 50 mg by mouth 2 (two) times daily as needed (pain).  , Disp: , Rfl:    triamcinolone cream (KENALOG) 0.1 %, APPLY CREAM EXTERNALLY ONCE DAILY AS NEEDED FOR 30 DAYS, Disp: , Rfl:   Allergies:  Allergies  Allergen Reactions   Estrogens Other (See Comments)    Blood clots   Other Other (See Comments)    Unable to take birth control pills due to clotting disorder/fim   Prednisone Other (See Comments)    Facial edema - has had this 2020 and had no difficulty "can take in small doses"   Progesterone Other (See Comments)    Blood clots   Propofol Other (See Comments)    Legs started thrashing and skin tears.   Tape Rash    Please use paper tape    Past Medical History, Surgical history, Social history, and Family History were reviewed and updated.  Review of Systems: Review of Systems  Constitutional: Negative.   HENT: Negative.    Eyes: Negative.   Respiratory: Negative.    Cardiovascular: Negative.   Gastrointestinal: Negative.   Genitourinary: Negative.   Musculoskeletal: Negative.   Skin:  Positive for rash.  Neurological: Negative.   Endo/Heme/Allergies: Negative.   Psychiatric/Behavioral: Negative.      Physical Exam:  weight is 220 lb (99.8 kg). Her oral temperature is 98.2 F (36.8 C). Her blood pressure is 107/67 and her pulse is 70. Her respiration is 18 and oxygen saturation is 100%.   Wt Readings from Last 3 Encounters:  03/12/21 220 lb (99.8 kg)  01/21/21 218 lb (98.9 kg)  01/08/21 225 lb (102.1 kg)     Physical Exam Vitals reviewed.  HENT:     Head: Normocephalic and atraumatic.  Eyes:     Pupils: Pupils are equal, round, and reactive to light.  Cardiovascular:     Rate and Rhythm: Normal rate and regular rhythm.     Heart sounds: Normal heart sounds.  Pulmonary:     Effort: Pulmonary effort is normal.     Breath sounds: Normal breath sounds.  Abdominal:     General: Bowel sounds are normal.     Palpations: Abdomen is soft.     Comments: Abdominal exam shows an obese abdomen.  She has an extended  laparotomy scar in the right lower quadrant.  This is healing nicely.  She does have some subcutaneous nodules which are consistent with the calciphylaxis.  Musculoskeletal:        General: No tenderness or deformity. Normal range of motion.     Cervical back: Normal range of motion.     Comments: She has marked swelling of the right  lower leg.  There is no erythema.  There is no tenderness to palpation.  She is a negative Homans sign.  Lymphadenopathy:     Cervical: No cervical adenopathy.  Skin:    General: Skin is warm and dry.     Findings: No erythema or rash.  Neurological:     Mental Status: She is alert and oriented to person, place, and time.  Psychiatric:        Behavior: Behavior normal.        Thought Content: Thought content normal.        Judgment: Judgment normal.    Lab Results  Component Value Date   WBC 4.7 03/12/2021   HGB 11.3 (L) 03/12/2021   HCT 35.4 (L) 03/12/2021   MCV 92.9 03/12/2021   PLT 114 (L) 03/12/2021     Chemistry      Component Value Date/Time   NA 139 03/12/2021 0939   NA 144 06/25/2017 1146   K 4.9 03/12/2021 0939   K 4.1 06/25/2017 1146   CL 92 (L) 03/12/2021 0939   CL 103 06/25/2017 1146   CO2 24 03/12/2021 0939   CO2 31 06/25/2017 1146   BUN 40 (H) 03/12/2021 0939   BUN 27 (H) 06/25/2017 1146   CREATININE 4.76 (HH) 03/12/2021 0939   CREATININE 4.7 (HH) 06/25/2017 1146      Component Value Date/Time   CALCIUM 9.2 03/12/2021 0939   CALCIUM 9.7 06/25/2017 1146   ALKPHOS 100 03/12/2021 0939   ALKPHOS 60 06/25/2017 1146   AST 16 03/12/2021 0939   ALT 6 03/12/2021 0939   ALT 14 06/25/2017 1146   BILITOT 0.7 03/12/2021 0939       Impression and Plan: Ms. Zara is a 65 year old white female. She has history of recurrent thrombosis. She has both factor V Leiden and prothrombin II gene mutation.  She is on Eliquis.  Eliquis is doing very well for her.  She needs lifelong Eliquis.  I am happy about her hemoglobin.  She does not  need any ESA.  I do not see any problems with her having surgery for removal of the calciphylaxis lesions.  I called in the heparin for her.  Overall, I really think her quality of life is doing pretty well.  Hopefully she will be going up to [place] to visit.  I think she has a daughter down in [place] that she wants to visit also.  We will plan to get her back in 6 months.    Volanda Napoleon, MD 6/28/202210:33 AM

## 2021-03-12 NOTE — Telephone Encounter (Signed)
Appts made and printed for pt per los 03/12/21  Whitney Chavez

## 2021-03-12 NOTE — Telephone Encounter (Signed)
Lab brought to my attention a critical lab value of Creatinine of 4.76. Results given to MD.

## 2021-03-14 ENCOUNTER — Other Ambulatory Visit (HOSPITAL_COMMUNITY): Payer: Self-pay

## 2021-04-05 ENCOUNTER — Other Ambulatory Visit: Payer: Self-pay | Admitting: Neurology

## 2021-04-11 ENCOUNTER — Encounter: Payer: Self-pay | Admitting: Neurology

## 2021-04-11 ENCOUNTER — Ambulatory Visit (INDEPENDENT_AMBULATORY_CARE_PROVIDER_SITE_OTHER): Payer: Medicare Other | Admitting: Neurology

## 2021-04-11 VITALS — BP 121/65 | HR 70 | Ht 60.0 in | Wt 220.0 lb

## 2021-04-11 DIAGNOSIS — N186 End stage renal disease: Secondary | ICD-10-CM

## 2021-04-11 DIAGNOSIS — G250 Essential tremor: Secondary | ICD-10-CM

## 2021-04-11 DIAGNOSIS — G4733 Obstructive sleep apnea (adult) (pediatric): Secondary | ICD-10-CM | POA: Diagnosis not present

## 2021-04-11 DIAGNOSIS — G629 Polyneuropathy, unspecified: Secondary | ICD-10-CM | POA: Diagnosis not present

## 2021-04-11 DIAGNOSIS — G2581 Restless legs syndrome: Secondary | ICD-10-CM | POA: Diagnosis not present

## 2021-04-11 DIAGNOSIS — Z992 Dependence on renal dialysis: Secondary | ICD-10-CM

## 2021-04-11 MED ORDER — ROPINIROLE HCL 2 MG PO TABS: 2.0000 mg | ORAL_TABLET | Freq: Every day | ORAL | 3 refills | Status: DC

## 2021-04-11 MED ORDER — ROPINIROLE HCL 0.5 MG PO TABS: ORAL_TABLET | ORAL | 4 refills | Status: DC

## 2021-04-11 MED ORDER — GABAPENTIN 100 MG PO CAPS: 100.0000 mg | ORAL_CAPSULE | Freq: Four times a day (QID) | ORAL | 3 refills | Status: DC

## 2021-04-11 MED ORDER — TRAMADOL HCL 50 MG PO TABS: 50.0000 mg | ORAL_TABLET | Freq: Two times a day (BID) | ORAL | 5 refills | Status: DC | PRN

## 2021-04-11 NOTE — Progress Notes (Signed)
[facility]  PATIENT: Whitney Chavez DOB: 11/09/1955  REFERRING DOCTOR OR PCP:  none SOURCE: patient and records from [facility]  _________________________________   HISTORICAL  CHIEF COMPLAINT:  Chief Complaint  Patient presents with   Follow-up    Rm 1, alone. Here for yearly f/u, pt reports doing well w/ cpap therapy. Pt dx w/ Calciphylaxis, has had a few surgeries.     HISTORY OF PRESENT ILLNESS:  Whitney Chavez is a 65 year old woman with Obstructive sleep apnea , insomnia, restless leg syndrome and polyneuropathy.      Update 04/10/2020: She is using CPAP nightly and can' sleep well without it.   Download shows compliance was 90% and efficacy was good (AHI = 2.6).     She has more RLS symptoms during HD.  She takes ropinirole 1/2 pill before HD and 1/2 pill afterwards and at bedtime.  That and gabapentin have helped.  Tramadol 50 mg po bid has helped.     She takes gabapentin 300 mg a day (100 in pm and 200 at bedtime) and ropinirole 0.5 mg in pm and 2 mg at bedtime)   She takes additional ropinirole 0.5 mg before HD.      Hands are shaky and weak  Neuropathy pain has done better with the gabapentin and tramadol.     She had calciphylaxis due to HD (x 11+ years)  and required surgery .   She is trying to lose weight to be on the transplant list.   She needs to get down to around 200 pounds.    She has mild EDS.  EPWORTH SLEEPINESS SCALE  On a scale of 0 - 3 what is the chance of dozing:  Sitting and Reading:   2 Watching TV:    2 Sitting inactive in a public place: 2 Passenger in car for one hour: 1 Lying down to rest in the afternoon: 3 Sitting and talking to someone: 0 Sitting quietly after lunch:  2 In a car, stopped in traffic:  0  Total (out of 24):  12/24   Mild EDS (generally worse on HD days and better on non-HD days)        REVIEW OF SYSTEMS: Constitutional: No fevers, chills, sweats, or change in appetite Eyes: No  visual changes, double vision, eye pain Ear, nose and throat: No hearing loss, ear pain, nasal congestion, sore throat Cardiovascular: No chest pain, palpitations Respiratory:  No shortness of breath at rest or with exertion.   No wheezes GastrointestinaI: No nausea, vomiting, diarrhea, abdominal pain, fecal incontinence Genitourinary:  She is on hemodialysis. Musculoskeletal:  No neck pain, back pain Integumentary: No rash, pruritus, skin lesions Neurological: as above Psychiatric: No depression at this time.  No anxiety Endocrine: No palpitations, diaphoresis, change in appetite, change in weigh or increased thirst Hematologic/Lymphatic:  No anemia, purpura, petechiae. Allergic/Immunologic: No itchy/runny eyes, nasal congestion, recent allergic reactions, rashes  ALLERGIES: Allergies  Allergen Reactions   Estrogens Other (See Comments)    Blood clots   Other Other (See Comments)    Unable to take birth control pills due to clotting disorder/fim   Prednisone Other (See Comments)    Facial edema - has had this 2020 and had no difficulty "can take in small doses"   Progesterone Other (See Comments)    Blood clots   Propofol Other (See Comments)    Legs started thrashing and skin tears.   Tape Rash    Please use paper tape  HOME MEDICATIONS:  Current Outpatient Medications:    acetaminophen (TYLENOL) 325 MG tablet, Take 325 mg by mouth daily as needed for headache (pain). , Disp: , Rfl:    amiodarone (PACERONE) 200 MG tablet, Take 200 mg by mouth daily., Disp: , Rfl:    apixaban (ELIQUIS) 5 MG TABS tablet, Take 1 tablet (5 mg total) by mouth 2 (two) times daily., Disp: 180 tablet, Rfl: 2   atorvastatin (LIPITOR) 20 MG tablet, Take 20 mg by mouth daily. , Disp: , Rfl:    B Complex-C-Folic Acid (RENAL VITAMIN PO), Take 1 tablet by mouth daily., Disp: , Rfl:    budesonide-formoterol (SYMBICORT) 160-4.5 MCG/ACT inhaler, Inhale 2 puffs into the lungs 2 (two) times daily., Disp: 1  each, Rfl: 6   Darbepoetin Alfa-Albumin (ARANESP IJ), Inject as directed once a week. At dialysis center, dose varies., Disp: , Rfl:    Elastic Bandages & Supports ([place]) MISC, by Does not apply route. Leg wraps., Disp: , Rfl:    Etelcalcetide HCl (PARSABIV IV), Inject 1 Dose into the vein 3 (three) times a week. Receives at Dialysis, Disp: , Rfl:    heparin 5000 UNIT/ML injection, Inject 1 mL (5,000 Units total) into the skin 2 (two) times daily. To be given subcutaneously for 3 days prior to surgery on July 7th., Disp: 6 mL, Rfl: 0   INCRUSE ELLIPTA 62.5 MCG/INH AEPB, TAKE 1 PUFF BY MOUTH EVERY DAY, Disp: 90 each, Rfl: 1   lanthanum (FOSRENOL) 1000 MG chewable tablet, Chew 250-500 mg by mouth See admin instructions. Chew 1 tablet (500 mg) by mouth three times daily with meals, chew 1/4 tablet (250 mg) with snacks, Disp: , Rfl:    levalbuterol (XOPENEX HFA) 45 MCG/ACT inhaler, Inhale 2 puffs into the lungs every 6 (six) hours as needed for wheezing., Disp: 1 Inhaler, Rfl: 12   levalbuterol (XOPENEX) 0.63 MG/3ML nebulizer solution, Take 3 mLs (0.63 mg total) by nebulization every 4 (four) hours as needed for wheezing or shortness of breath., Disp: 75 mL, Rfl: 11   omeprazole (PRILOSEC) 20 MG capsule, Take 20 mg by mouth daily as needed (acid reflux). Rarely takes, Disp: , Rfl:    PRESCRIPTION MEDICATION, Inhale into the lungs at bedtime. CPAP with nasal pillow, Disp: , Rfl:    SODIUM THIOSULFATE IV, Inject 1 Dose into the vein 3 (three) times a week. Receives at Dialysis, Disp: , Rfl:    sulfaSALAzine (AZULFIDINE) 500 MG tablet, Take 1,000 mg by mouth 2 (two) times daily. , Disp: , Rfl:    triamcinolone cream (KENALOG) 0.1 %, APPLY CREAM EXTERNALLY ONCE DAILY AS NEEDED FOR 30 DAYS, Disp: , Rfl:    gabapentin (NEURONTIN) 100 MG capsule, Take 1 capsule (100 mg total) by mouth 4 (four) times daily., Disp: 360 capsule, Rfl: 3   rOPINIRole (REQUIP) 0.5 MG tablet, TAKE 1-2 TABLETS  BY MOUTH IN THE MORNING., Disp: 180 tablet, Rfl: 4   rOPINIRole (REQUIP) 2 MG tablet, Take 1 tablet (2 mg total) by mouth at bedtime., Disp: 90 tablet, Rfl: 3   traMADol (ULTRAM) 50 MG tablet, Take 1 tablet (50 mg total) by mouth 2 (two) times daily as needed (pain)., Disp: 60 tablet, Rfl: 5  PAST MEDICAL HISTORY: Past Medical History:  Diagnosis Date   Asthma    Axillary vein thrombosis ([place]) 08/16/2015   Calciphylaxis    Chronic kidney disease    Colitis    COPD (chronic obstructive pulmonary disease) ([place])    Factor V  [place] [facility])    Hypertension    Lymphedema    legs   MRSA (methicillin resistant staph aureus) culture positive    Neuropathy    Obstructive sleep apnea 03/29/2015   OSA on CPAP    Vision abnormalities     PAST SURGICAL HISTORY: Past Surgical History:  Procedure Laterality Date   APPLICATION OF WOUND VAC     AV FISTULA PLACEMENT Left    lumbar decompression fusion     TONSILLECTOMY      FAMILY HISTORY: Family History  Problem Relation Age of Onset   Diabetes Mother    Congestive Heart Failure Mother    COPD Mother    Stroke Mother    Neuropathy Mother    Heart disease Father    Stroke Father    Cancer Brother    Diabetes Brother    Diabetes Brother    Alcohol abuse Brother     SOCIAL HISTORY:  Social History   Socioeconomic History   Marital status: Single    Spouse name: Not on file   Number of children: Not on file   Years of education: Not on file   Highest education level: Not on file  Occupational History   Not on file  Tobacco Use   Smoking status: Former    Packs/day: 2.00    Years: 30.00    Pack years: 60.00    Types: Cigarettes    Quit date: 11/25/1998    Years since quitting: 22.3   Smokeless tobacco: Never  Vaping Use   Vaping Use: Never used  Substance and Sexual Activity   Alcohol use: No    Alcohol/week: 0.0 standard drinks   Drug use: No   Sexual activity: Not on file  Other Topics Concern   Not on file   Social History Narrative   Not on file   Social Determinants of Health   Financial Resource Strain: Not on file  Food Insecurity: Not on file  Transportation Needs: Not on file  Physical Activity: Not on file  Stress: Not on file  Social Connections: Not on file  Intimate Partner Violence: Not on file     PHYSICAL EXAM  Vitals:   04/11/21 1054  BP: 121/65  Pulse: 70  Weight: 220 lb (99.8 kg)  Height: 5' (1.524 m)    Body mass index is 42.97 kg/m.   General: The patient is well-developed and well-nourished and in no acute distress.   She has Lymphedema and legs are wrapped   Neurologic Exam  Mental status: The patient is alert and oriented x 3 at the time of the examination. The patient has apparent normal recent and remote memory, with an apparently normal attention span and concentration ability.   Speech is normal.  Cranial nerves: Extraocular muscles are intact. Facial strength and sensation is normal.  Hearing is symmetric.    Motor:  Mild essential tremor in hands.  Muscle bulk is normal.   Tone is normal. Strength is normal in the arms. Strength is 4+/5 in the foot and ankle extensors  Sensory: She has reduced sensation to vibration at the ankles and absent at toes  There is reduced sensation to touch from the ankles down.  Coordination: Cerebellar testing reveals good finger-nose-finger bilaterally.  Heel-to-shin is poor.  Gait and station: Station is normal.   Gait is mildly wide.  She is unable to do tandem walk.  Romberg is mildly positive.  Reflexes: Deep tendon reflexes are 1+ in arms and absent  in knees and ankles.        ASSESSMENT AND PLAN    1. Obstructive sleep apnea   2. Polyneuropathy   3. Restless leg   4. End-stage renal disease on hemodialysis ([place])   5. Essential tremor   6. OSA (obstructive sleep apnea)      1.   Neuropathy is doing ok on current medications.   She will continue gabapentin, tramadol and ropinirole for her  neuropathic pain and restless leg syndrome.  She can take up to 400 mg gabapentin daily and up to 3 mg ropinirole.    Tramadol 50 mg po bid.   2.   She will continue CPAP +10cm .  She has benefited from CPAP for the treatment of her severe OSA (AHI = 44.7 in February 2015 PSG).    3.   Return in 6 months or sooner if there are new or worsening neurologic symptoms.   Dylyn Mclaren A. Felecia Shelling, MD, PhD, Charlynn Grimes  1/85/9093, 11:21 AM Certified in Neurology, Clinical Neurophysiology, Sleep Medicine, Pain Medicine and Neuroimaging  [facility] [address], [place], [place] [address] [phone]

## 2021-06-17 ENCOUNTER — Telehealth: Payer: Self-pay | Admitting: Neurology

## 2021-06-17 MED ORDER — TRAMADOL HCL 50 MG PO TABS: 50.0000 mg | ORAL_TABLET | Freq: Two times a day (BID) | ORAL | 0 refills | Status: DC | PRN

## 2021-06-17 NOTE — Telephone Encounter (Signed)
Pt called, going stay daughter for  10/8-10/29 in [place]. Asking if Dr. Felecia Shelling can write a 1 month prescription for traMADol (ULTRAM) 50 MG tablet. Was told by pharmacy could not do a vacation override because is a controlled substance. Would like a call from the nurse.

## 2021-06-17 NOTE — Telephone Encounter (Signed)
Called and spoke w/ pt. She is leaving for [facility] 06/22/21 and will be coming back 07/13/21. She refilled tramadol last 06/01/21. Not due for refill until 07/01/21. However, she will run out of med while in [facility]. Wondering if Dr. Felecia Shelling can call in 30 days supply to [facility] I added pharmacy in her chart. Aware we will speak w/ MD and call her back tomorrow

## 2021-06-18 NOTE — Telephone Encounter (Signed)
MD e-scribed rx to pharmacy last night for pt

## 2021-06-18 NOTE — Telephone Encounter (Signed)
Called pt. Relayed MD called in rx for her. She verbalized understanding and appreciation.

## 2021-06-19 ENCOUNTER — Other Ambulatory Visit: Payer: Self-pay | Admitting: Primary Care

## 2021-08-22 ENCOUNTER — Other Ambulatory Visit: Payer: Self-pay | Admitting: *Deleted

## 2021-08-22 DIAGNOSIS — D6851 Activated protein C resistance: Secondary | ICD-10-CM

## 2021-08-22 MED ORDER — APIXABAN 5 MG PO TABS: 5.0000 mg | ORAL_TABLET | Freq: Two times a day (BID) | ORAL | 2 refills | Status: DC

## 2021-08-27 ENCOUNTER — Inpatient Hospital Stay (HOSPITAL_BASED_OUTPATIENT_CLINIC_OR_DEPARTMENT_OTHER): Payer: Medicare Other | Admitting: Hematology & Oncology

## 2021-08-27 ENCOUNTER — Encounter: Payer: Self-pay | Admitting: Hematology & Oncology

## 2021-08-27 ENCOUNTER — Other Ambulatory Visit: Payer: Self-pay

## 2021-08-27 ENCOUNTER — Inpatient Hospital Stay: Payer: Medicare Other | Attending: Hematology & Oncology

## 2021-08-27 VITALS — BP 101/41 | HR 70 | Temp 98.2°F | Resp 18 | Wt 212.0 lb

## 2021-08-27 DIAGNOSIS — Z992 Dependence on renal dialysis: Secondary | ICD-10-CM | POA: Insufficient documentation

## 2021-08-27 DIAGNOSIS — Z7951 Long term (current) use of inhaled steroids: Secondary | ICD-10-CM | POA: Insufficient documentation

## 2021-08-27 DIAGNOSIS — Z86718 Personal history of other venous thrombosis and embolism: Secondary | ICD-10-CM | POA: Insufficient documentation

## 2021-08-27 DIAGNOSIS — Z7901 Long term (current) use of anticoagulants: Secondary | ICD-10-CM | POA: Insufficient documentation

## 2021-08-27 DIAGNOSIS — D6851 Activated protein C resistance: Secondary | ICD-10-CM

## 2021-08-27 DIAGNOSIS — Z79899 Other long term (current) drug therapy: Secondary | ICD-10-CM | POA: Insufficient documentation

## 2021-08-27 LAB — CMP (CANCER CENTER ONLY)
ALT: 5 U/L (ref 0–44)
AST: 14 U/L — ABNORMAL LOW (ref 15–41)
Albumin: 4.5 g/dL (ref 3.5–5.0)
Alkaline Phosphatase: 106 U/L (ref 38–126)
Anion gap: 26 — ABNORMAL HIGH (ref 5–15)
BUN: 35 mg/dL — ABNORMAL HIGH (ref 8–23)
CO2: 24 mmol/L (ref 22–32)
Calcium: 9.3 mg/dL (ref 8.9–10.3)
Chloride: 90 mmol/L — ABNORMAL LOW (ref 98–111)
Creatinine: 4.82 mg/dL (ref 0.44–1.00)
GFR, Estimated: 9 mL/min — ABNORMAL LOW (ref >60)
Glucose, Bld: 94 mg/dL (ref 70–99)
Potassium: 5.8 mmol/L — ABNORMAL HIGH (ref 3.5–5.1)
Sodium: 140 mmol/L (ref 135–145)
Total Bilirubin: 0.6 mg/dL (ref 0.3–1.2)
Total Protein: 7.3 g/dL (ref 6.5–8.1)

## 2021-08-27 LAB — CBC WITH DIFFERENTIAL (CANCER CENTER ONLY)
Abs Immature Granulocytes: 0.02 10*3/uL (ref 0.00–0.07)
Basophils Absolute: 0 10*3/uL (ref 0.0–0.1)
Basophils Relative: 1 %
Eosinophils Absolute: 0.4 10*3/uL (ref 0.0–0.5)
Eosinophils Relative: 6 %
HCT: 34.5 % — ABNORMAL LOW (ref 36.0–46.0)
Hemoglobin: 11 g/dL — ABNORMAL LOW (ref 12.0–15.0)
Immature Granulocytes: 0 %
Lymphocytes Relative: 13 %
Lymphs Abs: 0.9 10*3/uL (ref 0.7–4.0)
MCH: 30.3 pg (ref 26.0–34.0)
MCHC: 31.9 g/dL (ref 30.0–36.0)
MCV: 95 fL (ref 80.0–100.0)
Monocytes Absolute: 0.5 10*3/uL (ref 0.1–1.0)
Monocytes Relative: 8 %
Neutro Abs: 4.6 10*3/uL (ref 1.7–7.7)
Neutrophils Relative %: 72 %
Platelet Count: 172 10*3/uL (ref 150–400)
RBC: 3.63 MIL/uL — ABNORMAL LOW (ref 3.87–5.11)
RDW: 17.2 % — ABNORMAL HIGH (ref 11.5–15.5)
WBC Count: 6.3 10*3/uL (ref 4.0–10.5)
nRBC: 0 % (ref 0.0–0.2)

## 2021-08-27 NOTE — Progress Notes (Signed)
Hematology and Oncology Follow Up Visit  VETTA COUZENS [id number] September 06, 1956 65 y.o. 08/27/2021   Principle Diagnosis:  Recurrent thromboembolic disease Heterozygous factor V Leiden mutation Prothrombin II gene mutation Calciphylaxis  Current Therapy:   Eliquis 5 mg po BID     Interim History:  Ms. Deyoe is back for follow-up.  She was here 6 months ago.  Since we last saw her, she had surgery to remove a lot of the lesions and tumors that were caused by the calciphylaxis.  This was in early July.  She really looks great.  Surgery today fabulous job with her.  She was about over 100 staples in the wound.  She healed up incredibly well.  She really has no keloid formation.  She then went down to [place] to see her a daughter.  This was on the [facility].  She had a great time.  I saw pictures that she took.  Looks like she really enjoyed herself.  She gets her dialysis tomorrow.  She is doing well with dialysis.  She can lose some more weight, she will get back onto the kidney transplant list.  She is doing well on the Eliquis..  She has had no bleeding.  She has had no change in bowel or bladder habits.  She has had no fever.  There is no cough.  Overall, her performance status is ECOG 1.    Medications:  Current Outpatient Medications:    acetaminophen (TYLENOL) 325 MG tablet, Take 325 mg by mouth daily as needed for headache (pain). , Disp: , Rfl:    amiodarone (PACERONE) 200 MG tablet, Take 200 mg by mouth daily., Disp: , Rfl:    apixaban (ELIQUIS) 5 MG TABS tablet, Take 1 tablet (5 mg total) by mouth 2 (two) times daily., Disp: 180 tablet, Rfl: 2   atorvastatin (LIPITOR) 20 MG tablet, Take 20 mg by mouth daily. , Disp: , Rfl:    B Complex-C-Folic Acid (RENAL VITAMIN PO), Take 1 tablet by mouth daily., Disp: , Rfl:    budesonide-formoterol (SYMBICORT) 160-4.5 MCG/ACT inhaler, TAKE 2 PUFFS BY MOUTH TWICE A DAY, Disp: 30.6 each, Rfl: 3   Darbepoetin Alfa-Albumin (ARANESP IJ),  Inject as directed once a week. At dialysis center, dose varies., Disp: , Rfl:    Elastic Bandages & Supports ([place]) MISC, by Does not apply route. Leg wraps., Disp: , Rfl:    Etelcalcetide HCl (PARSABIV IV), Inject 1 Dose into the vein 3 (three) times a week. Receives at Dialysis, Disp: , Rfl:    gabapentin (NEURONTIN) 100 MG capsule, Take 1 capsule (100 mg total) by mouth 4 (four) times daily., Disp: 360 capsule, Rfl: 3   heparin 5000 UNIT/ML injection, Inject 1 mL (5,000 Units total) into the skin 2 (two) times daily. To be given subcutaneously for 3 days prior to surgery on July 7th., Disp: 6 mL, Rfl: 0   INCRUSE ELLIPTA 62.5 MCG/INH AEPB, TAKE 1 PUFF BY MOUTH EVERY DAY, Disp: 90 each, Rfl: 1   lanthanum (FOSRENOL) 1000 MG chewable tablet, Chew 250-500 mg by mouth See admin instructions. Chew 1 tablet (500 mg) by mouth three times daily with meals, chew 1/4 tablet (250 mg) with snacks, Disp: , Rfl:    levalbuterol (XOPENEX HFA) 45 MCG/ACT inhaler, Inhale 2 puffs into the lungs every 6 (six) hours as needed for wheezing., Disp: 1 Inhaler, Rfl: 12   omeprazole (PRILOSEC) 20 MG capsule, Take 20 mg by mouth daily as needed (acid reflux).  Rarely takes, Disp: , Rfl:    PRESCRIPTION MEDICATION, Inhale into the lungs at bedtime. CPAP with nasal pillow, Disp: , Rfl:    rOPINIRole (REQUIP) 0.5 MG tablet, TAKE 1-2 TABLETS BY MOUTH IN THE MORNING., Disp: 180 tablet, Rfl: 4   rOPINIRole (REQUIP) 2 MG tablet, Take 1 tablet (2 mg total) by mouth at bedtime., Disp: 90 tablet, Rfl: 3   SODIUM THIOSULFATE IV, Inject 1 Dose into the vein 3 (three) times a week. Receives at Dialysis, Disp: , Rfl:    traMADol (ULTRAM) 50 MG tablet, Take 1 tablet (50 mg total) by mouth 2 (two) times daily as needed (pain)., Disp: 60 tablet, Rfl: 0   triamcinolone cream (KENALOG) 0.1 %, APPLY CREAM EXTERNALLY ONCE DAILY AS NEEDED FOR 30 DAYS, Disp: , Rfl:    levalbuterol (XOPENEX) 0.63 MG/3ML nebulizer solution,  Take 3 mLs (0.63 mg total) by nebulization every 4 (four) hours as needed for wheezing or shortness of breath. (Patient not taking: Reported on 08/27/2021), Disp: 75 mL, Rfl: 11   sulfaSALAzine (AZULFIDINE) 500 MG tablet, Take 1,000 mg by mouth 2 (two) times daily.  (Patient not taking: Reported on 08/27/2021), Disp: , Rfl:   Allergies:  Allergies  Allergen Reactions   Estrogens Other (See Comments)    Blood clots   Other Other (See Comments)    Unable to take birth control pills due to clotting disorder/fim   Prednisone Other (See Comments)    Facial edema - has had this 2020 and had no difficulty "can take in small doses"   Progesterone Other (See Comments)    Blood clots   Propofol Other (See Comments)    Legs started thrashing and skin tears.   Lisinopril Cough   Clotrimazole Rash   Tape Rash    Please use paper tape    Past Medical History, Surgical history, Social history, and Family History were reviewed and updated.  Review of Systems: Review of Systems  Constitutional: Negative.   HENT: Negative.    Eyes: Negative.   Respiratory: Negative.    Cardiovascular: Negative.   Gastrointestinal: Negative.   Genitourinary: Negative.   Musculoskeletal: Negative.   Skin:  Positive for rash.  Neurological: Negative.   Endo/Heme/Allergies: Negative.   Psychiatric/Behavioral: Negative.      Physical Exam:  weight is 212 lb (96.2 kg). Her oral temperature is 98.2 F (36.8 C). Her blood pressure is 101/41 (abnormal) and her pulse is 70. Her respiration is 18 and oxygen saturation is 97%.   Wt Readings from Last 3 Encounters:  08/27/21 212 lb (96.2 kg)  04/11/21 220 lb (99.8 kg)  03/12/21 220 lb (99.8 kg)     Physical Exam Vitals reviewed.  HENT:     Head: Normocephalic and atraumatic.  Eyes:     Pupils: Pupils are equal, round, and reactive to light.  Cardiovascular:     Rate and Rhythm: Normal rate and regular rhythm.     Heart sounds: Normal heart sounds.   Pulmonary:     Effort: Pulmonary effort is normal.     Breath sounds: Normal breath sounds.  Abdominal:     General: Bowel sounds are normal.     Palpations: Abdomen is soft.     Comments: Abdominal exam shows an obese abdomen.  She has a healed laparotomy scar all through the abdomen from right to left side.  This is below the umbilicus.  I cannot palpate any obvious tumors from the calciphylaxis.    Musculoskeletal:  General: No tenderness or deformity. Normal range of motion.     Cervical back: Normal range of motion.     Comments: She has marked swelling of the right lower leg.  There is no erythema.  There is no tenderness to palpation.  She is a negative Homans sign.  Lymphadenopathy:     Cervical: No cervical adenopathy.  Skin:    General: Skin is warm and dry.     Findings: No erythema or rash.  Neurological:     Mental Status: She is alert and oriented to person, place, and time.  Psychiatric:        Behavior: Behavior normal.        Thought Content: Thought content normal.        Judgment: Judgment normal.    Lab Results  Component Value Date   WBC 6.3 08/27/2021   HGB 11.0 (L) 08/27/2021   HCT 34.5 (L) 08/27/2021   MCV 95.0 08/27/2021   PLT 172 08/27/2021     Chemistry      Component Value Date/Time   NA 139 03/12/2021 0939   NA 144 06/25/2017 1146   K 4.9 03/12/2021 0939   K 4.1 06/25/2017 1146   CL 92 (L) 03/12/2021 0939   CL 103 06/25/2017 1146   CO2 24 03/12/2021 0939   CO2 31 06/25/2017 1146   BUN 40 (H) 03/12/2021 0939   BUN 27 (H) 06/25/2017 1146   CREATININE 4.76 (HH) 03/12/2021 0939   CREATININE 4.7 (HH) 06/25/2017 1146      Component Value Date/Time   CALCIUM 9.2 03/12/2021 0939   CALCIUM 9.7 06/25/2017 1146   ALKPHOS 100 03/12/2021 0939   ALKPHOS 60 06/25/2017 1146   AST 16 03/12/2021 0939   ALT 6 03/12/2021 0939   ALT 14 06/25/2017 1146   BILITOT 0.7 03/12/2021 0939       Impression and Plan: Ms. Ehlert is a 65 year old white  female. She has history of recurrent thrombosis. She has both factor V Leiden and prothrombin II gene mutation.  She is on Eliquis.  Eliquis is doing very well for her.  She needs lifelong Eliquis.  She got through the surgery without any problems.  We had her on heparin then switched back over to Eliquis.  Her blood count is holding steady.  I am so happy for her.  I would that she was able to go down to [place].  It sounds like she will have a nice Christmas with her friends and family.  We will get her back in 6 months.    Volanda Napoleon, MD 12/13/202210:00 AM

## 2021-10-16 NOTE — Patient Instructions (Addendum)
Please continue using your CPAP regularly. While your insurance requires that you use CPAP at least 4 hours each night on 70% of the nights, I recommend, that you not skip any nights and use it throughout the night if you can. Getting used to CPAP and staying with the treatment long term does take time and patience and discipline. Untreated obstructive sleep apnea when it is moderate to severe can have an adverse impact on cardiovascular health and raise her risk for heart disease, arrhythmias, hypertension, congestive heart failure, stroke and diabetes. Untreated obstructive sleep apnea causes sleep disruption, nonrestorative sleep, and sleep deprivation. This can have an impact on your day to day functioning and cause daytime sleepiness and impairment of cognitive function, memory loss, mood disturbance, and problems focussing. Using CPAP regularly can improve these symptoms.   Continue gabapentin up to 400mg  daily and ropinirole up to 3mg  daily for neuropathy and restless legs. Continue tramadol up to 50mg  BID for breakthrough pain.   Let me know if you are not able to get the supplies you need and we will consider transfer of care to another DME. Continue to monitor right hand numbness and let me know if it worsens.   Follow up in 6 months

## 2021-10-16 NOTE — Progress Notes (Signed)
PATIENT: Whitney Chavez DOB: [birth year]  REASON FOR VISIT: follow up HISTORY FROM: patient  Chief Complaint  Patient presents with   Obstructive Sleep Apnea    Rm 11, alone. Here for 6 month RLS/neuropathy and CPAP f/u. Pt reports doing well on CPAP. Pt uses small nasal pillows and [facility] doesn't always get the right supplies. Pt would like to get supplies through someone else. Pt reports hand starting to feel numb. R hand can get swollen, possibly from arthritis.       HISTORY OF PRESENT ILLNESS: 10/17/21 ALL: Whitney Chavez returns for follow up for OSA on CPAP, insomnia, RLS and polyneuropathy. She continues to do well on CPAP therapy. She is sleeping well. She has had some trouble with DME getting her supplies. Was with [facility], now [facility]. She uses a small nasal pillow and they do not provide these individually.   She continues gabapentin 300mg  and ropinirole 2mg  at bedtime. RLS is stable. She takes an extra 100mg  dose of bapatenin and 0.5mg  dose of ropinirole on dialysis if needed. She has not had to use as often since discontinuing nortriptyline. She uses tramadol for breakthrough pain, usually 50mg  BID. She has had some waxing and waning numbness and swelling of the right hand. She feels that it is worse with rainy weather. She worked in an office for years. Tylenol helps.   She has lost over 100 pounds. She is hoping to qualify for kidney transplant in the future. She continues to use Rolator but working on Water quality scientist. She uses a stationary peddler at home.   10/04/2019 ALL:  Whitney Chavez is a 66 y.o. female here today for follow up for OSA, insomnia, RLS and polyneuropathy. She reports that she is doing very well. She is on dialysis. She is trying to lose weight to be considered for transplant. They are adjusting her dry weight and she reports that sometimes her RLS and neuropathy worsen. She does take 0.5mg  and 2mg  ropinirole. She uses 0.5mg  and 100mg  gabapentin during the day as  needed when on dialysis. This has helped significantly. She takes gabapentin 300mg  and 2mg  ropinirole at bedtime.   She recently received a new CPAP machine. She loves it. She is using CPAP nightly. She feels better rested with CPAP therapy. Compliance report reveals nightly use. Residual AHI 1.3 on 11cmH20. No leaks noted.   HISTORY: (copied from Dr Garth Bigness note on 04/05/2019)  Whitney Chavez is a 66 year old woman with Obstructive sleep apnea , insomnia, restless leg syndrome and polyneuropathy.       Update 04/05/2019: She feels gabapentin and ropinirole is helping her RLS.    The gabapentin also helps her neuropathic pain in her feet .   The tramadol started for another indication also helps.   She also developed lymphedema and sees a specialist at [facility].    She hsa lost 17 pounds of fluid   She has ESRD on HD and developed calciphylaxis with lumps of calcium in her skin.  Now treated with sodium thiopental after her HD treatments.    Due to pain she has been placed on tramadol for the calcitosis.    She is on tramadol 50 mg twice a day for the pain.   She tolerates it very well.   She has OSA and is doing well.   She sleeps through the night and feels refresehd when she wakes up.    She is on +11 cm H2O pressure.   She wears it for  naps if she takes one at home.    She does doze off at ESRD (can't wear CPAP there) but not with other activities.      Update 04/01/2017: She has had more RLS and neuropathy/.dysesthesia symptoms.    RLS is much worse with dialysis.       She is currently taking gabapentin 100 mg x 3 daily (one at dialysis)   She is on 1/2 mg ropinirole during the day and 2 mg at night.      She has calciphylaxis and has a lot of calcification issues with subcutaneous large nodules.   Also has Dercum's disease.   These issues have caused the lesions 'eaten' throug the skin and she has abdominal and hip lesions.   She is being treated for these issues.   She does wound care there.     She  also sees pain management and is on tramadol.      Her severe OSA does well and she uses CPAP daily and can't sleep well without it.     RLS bothers her just mildly at .night.   Insomnia is variable, bad last night   She needs new orthotics for her shoes   From 03/31/2017: She is having more difficulty getting around her house to perform activities of daily living and would benefit from a power vehicle such as a scooter.   Restless leg syndrome/polyneuropathy:  She gets RLS, worse with HD.   A combination of gabapentin with ropinirole have greatly helped.   Her RLS was severe and occurred day and night, worse after each HD session.   She feels she sleeps better with the RLS med's.     She has polyneuropathy with numbness up to her ankles and has been stable the past year. . A nerve conduction study/EMG February 2015 showed a length dependent sensory motor axonal polyneuropathy with active features in the lower leg history she had a superimposed mild-to-moderate right carpal tunnel syndrome.   She has special orthotics that help her foot pain some.     Gait:   Due to the polyneuropathy, she has a gait disturbance and has occasional falls. She uses a cane but has difficulty walking longer distances    OSA:  She is sleeping well with CPAP  +10 cm every night.   A PSG study February 2015 showed an AHI equals 44.7 with very poor sleep efficiency. Due to the poor sleep efficiency she had AutoPap as an outpatient to avoid another night in the laboratory.   She does not note much sleepiness but may lay nod off for a few minutes around 3 pm and then does well the rest of the day.      Insomnia:  She is sleeping much better on the combination of CPAP, gabapentin and ropinirole.  She has sleep maintenance more than sleep onset insomnia. She takes 400 mg gabapentin with benefit. Insomnia is worse night after hemodialysis.   Other: She is on hemodialysis for CRF due to vitamin D toxicity. She has had anemia  associated with ESRD.    She has painful lumps in her skin and subcutaneous tissue felt due to the Aranesp/Epogen.    REVIEW OF SYSTEMS: Out of a complete 14 system review of symptoms, the patient complains only of the following symptoms, restless legs, neuropathy, numbness of right wrist and hand and all other reviewed systems are negative.  Epworth sleepiness scale: 9  ALLERGIES: Allergies  Allergen Reactions   Estrogens Other (See  Comments)    Blood clots   Other Other (See Comments)    Unable to take birth control pills due to clotting disorder/fim   Prednisone Other (See Comments)    Facial edema - has had this 2020 and had no difficulty "can take in small doses"   Progesterone Other (See Comments)    Blood clots   Propofol Other (See Comments)    Legs started thrashing and skin tears.   Lisinopril Cough   Clotrimazole Rash   Tape Rash    Please use paper tape    HOME MEDICATIONS: Outpatient Medications Prior to Visit  Medication Sig Dispense Refill   acetaminophen (TYLENOL) 325 MG tablet Take 325 mg by mouth daily as needed for headache (pain).      amiodarone (PACERONE) 200 MG tablet Take 200 mg by mouth daily.     apixaban (ELIQUIS) 5 MG TABS tablet Take 1 tablet (5 mg total) by mouth 2 (two) times daily. 180 tablet 2   atorvastatin (LIPITOR) 20 MG tablet Take 20 mg by mouth daily.      B Complex-C-Folic Acid (RENAL VITAMIN PO) Take 1 tablet by mouth daily.     budesonide-formoterol (SYMBICORT) 160-4.5 MCG/ACT inhaler TAKE 2 PUFFS BY MOUTH TWICE A DAY 30.6 each 3   Elastic Bandages & Supports (MEDICAL COMPRESSION STOCKINGS) MISC by Does not apply route. Leg wraps.     epoetin alfa (EPOGEN) 2000 UNIT/ML injection See admin instructions.     Etelcalcetide HCl (PARSABIV IV) Inject 1 Dose into the vein 3 (three) times a week. Receives at Dialysis     gabapentin (NEURONTIN) 100 MG capsule Take 1 capsule (100 mg total) by mouth 4 (four) times daily. 360 capsule 3   heparin  5000 UNIT/ML injection Inject 1 mL (5,000 Units total) into the skin 2 (two) times daily. To be given subcutaneously for 3 days prior to surgery on July 7th. 6 mL 0   INCRUSE ELLIPTA 62.5 MCG/INH AEPB TAKE 1 PUFF BY MOUTH EVERY DAY 90 each 1   lanthanum (FOSRENOL) 1000 MG chewable tablet Chew 250-500 mg by mouth See admin instructions. Chew 1 tablet (500 mg) by mouth three times daily with meals, chew 1/4 tablet (250 mg) with snacks     levalbuterol (XOPENEX HFA) 45 MCG/ACT inhaler Inhale 2 puffs into the lungs every 6 (six) hours as needed for wheezing. 1 Inhaler 12   levalbuterol (XOPENEX) 0.63 MG/3ML nebulizer solution Take 3 mLs (0.63 mg total) by nebulization every 4 (four) hours as needed for wheezing or shortness of breath. 75 mL 11   omeprazole (PRILOSEC) 20 MG capsule Take 20 mg by mouth daily as needed (acid reflux). Rarely takes     PRESCRIPTION MEDICATION Inhale into the lungs at bedtime. CPAP with nasal pillow     rOPINIRole (REQUIP) 0.5 MG tablet TAKE 1-2 TABLETS BY MOUTH IN THE MORNING. 180 tablet 4   rOPINIRole (REQUIP) 2 MG tablet Take 1 tablet (2 mg total) by mouth at bedtime. 90 tablet 3   SODIUM THIOSULFATE IV Inject 1 Dose into the vein 3 (three) times a week. Receives at Dialysis     sulfaSALAzine (AZULFIDINE) 500 MG tablet Take 1,000 mg by mouth 2 (two) times daily.     traMADol (ULTRAM) 50 MG tablet Take 1 tablet (50 mg total) by mouth 2 (two) times daily as needed (pain). 60 tablet 0   triamcinolone cream (KENALOG) 0.1 % APPLY CREAM EXTERNALLY ONCE DAILY AS NEEDED FOR 30 DAYS  Darbepoetin Alfa-Albumin (ARANESP IJ) Inject as directed once a week. At dialysis center, dose varies.     No facility-administered medications prior to visit.    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   Calciphylaxis    Chronic kidney disease    Colitis    COPD (chronic obstructive pulmonary disease) (HCC)     Factor V Leiden (HCC)    Hypertension    Lymphedema    legs   MRSA (methicillin resistant staph aureus) culture positive    Neuropathy    Obstructive sleep apnea 03/29/2015   OSA on CPAP    Vision abnormalities     PAST SURGICAL HISTORY: Past Surgical History:  Procedure Laterality Date   APPLICATION OF WOUND VAC     AV FISTULA PLACEMENT Left    lumbar decompression fusion     TONSILLECTOMY      FAMILY HISTORY: Family History  Problem Relation Age of Onset   Diabetes Mother    Congestive Heart Failure Mother    COPD Mother    Stroke Mother    Neuropathy Mother    Heart disease Father    Stroke Father    Cancer Brother    Diabetes Brother    Diabetes Brother    Alcohol abuse Brother     SOCIAL HISTORY: Social History   Socioeconomic History   Marital status: Single    Spouse name: Not on file   Number of children: Not on file   Years of education: Not on file   Highest education level: Not on file  Occupational History   Not on file  Tobacco Use   Smoking status: Former    Packs/day: 2.00    Years: 30.00    Pack years: 60.00    Types: Cigarettes    Quit date: 11/25/1998    Years since quitting: 22.9   Smokeless tobacco: Never  Vaping Use   Vaping Use: Never used  Substance and Sexual Activity   Alcohol use: No    Alcohol/week: 0.0 standard drinks   Drug use: No   Sexual activity: Not on file  Other Topics Concern   Not on file  Social History Narrative   Not on file   Social Determinants of Health   Financial Resource Strain: Not on file  Food Insecurity: Not on file  Transportation Needs: Not on file  Physical Activity: Not on file  Stress: Not on file  Social Connections: Not on file  Intimate Partner Violence: Not on file      PHYSICAL EXAM  Vitals:   10/17/21 1020  BP: (!) 155/75  Pulse: 68  Weight: 210 lb (95.3 kg)  Height: 5' (1.524 m)    Body mass index is 41.01 kg/m.  Generalized: Well developed, in no acute distress   Cardiology: normal rate and rhythm, no murmur noted Respiratory: clear to auscultation bilaterally  Neurological examination  Mentation: Alert oriented to time, place, history taking. Follows all commands speech and language fluent Cranial nerve II-XII: Pupils were equal round reactive to light. Extraocular movements were full, visual field were full . Motor: The motor testing reveals 5 over 5 strength of all 4 extremities. Good symmetric motor tone is noted throughout.  Gait and station: Gait is stable with Rolator, Tandem not attempted.  Vascular: left fistula with good bruit auscultated and thrill palpated.  MSK: edema noted of right thumb, mostly at base, mild tenderness with palpation.  DIAGNOSTIC DATA (LABS, IMAGING, TESTING) - I reviewed patient records, labs, notes, testing and imaging myself where available.  No flowsheet data found.   Lab Results  Component Value Date   WBC 6.3 08/27/2021   HGB 11.0 (L) 08/27/2021   HCT 34.5 (L) 08/27/2021   MCV 95.0 08/27/2021   PLT 172 08/27/2021      Component Value Date/Time   NA 140 08/27/2021 0908   NA 144 06/25/2017 1146   K 5.8 (H) 08/27/2021 0908   K 4.1 06/25/2017 1146   CL 90 (L) 08/27/2021 0908   CL 103 06/25/2017 1146   CO2 24 08/27/2021 0908   CO2 31 06/25/2017 1146   GLUCOSE 94 08/27/2021 0908   GLUCOSE 119 (H) 06/25/2017 1146   BUN 35 (H) 08/27/2021 0908   BUN 27 (H) 06/25/2017 1146   CREATININE 4.82 (HH) 08/27/2021 0908   CREATININE 4.7 (HH) 06/25/2017 1146   CALCIUM 9.3 08/27/2021 0908   CALCIUM 9.7 06/25/2017 1146   PROT 7.3 08/27/2021 0908   PROT 7.0 06/25/2017 1146   ALBUMIN 4.5 08/27/2021 0908   ALBUMIN 3.6 06/25/2017 1146   AST 14 (L) 08/27/2021 0908   ALT 5 08/27/2021 0908   ALT 14 06/25/2017 1146   ALKPHOS 106 08/27/2021 0908   ALKPHOS 60 06/25/2017 1146   BILITOT 0.6 08/27/2021 0908   GFRNONAA 9 (L) 08/27/2021 0908   GFRAA 10 (L) 01/12/2020 0851   No results found for: CHOL, HDL, LDLCALC,  LDLDIRECT, TRIG, CHOLHDL No results found for: HGBA1C No results found for: VITAMINB12 No results found for: TSH     ASSESSMENT AND PLAN 66 y.o. year old female  has a past medical history of Asthma, Axillary vein thrombosis ([place]) (08/16/2015), Calciphylaxis, Chronic kidney disease, Colitis, COPD (chronic obstructive pulmonary disease) ([place]), Factor V Leiden ([place]), Hypertension, Lymphedema, MRSA (methicillin resistant staph aureus) culture positive, Neuropathy, Obstructive sleep apnea (03/29/2015), OSA on CPAP, and Vision abnormalities. here with     ICD-10-CM   1. Polyneuropathy  G62.9     2. Restless leg  G25.81     3. OSA on CPAP  G47.33    Z99.89        Caralee is doing very well. She will continue ropinirole 2 mg and gabapentin 300 mg at bedtime.  May take extra 100mg  dose of gabapentin and 0.5mg  dose of ropinirole as needed. Continue tramadol 50mg  BID as needed. PDMP appropriate. Last refill 10/05/2021. She will call when due. Compliance report reveals excellent compliance.  She is doing very well with her CPAP machine.  She was encouraged to continue using CPAP nightly and for greater than 4 hours each night.  She will reach out to [facility] regarding concerns obtaining nasal pillow mask. She will let me know if this goes unresolved. Can consider transfer of care to different DME if needed. She will continue to monitor right hand numbness and let me know if it worsens. She will continue close follow-up with primary care and nephrology as directed.  She will follow-up with [place] in 6 months, sooner if needed.  She verbalizes understanding and agreement with this plan.   No orders of the defined types were placed in this encounter.    No orders of the defined types were placed in this encounter.    Debbora Presto, FNP-C 10/17/2021, 11:01 AM [facility] [address], [place], [place] [address] [phone]

## 2021-10-17 ENCOUNTER — Other Ambulatory Visit: Payer: Self-pay

## 2021-10-17 ENCOUNTER — Encounter: Payer: Self-pay | Admitting: Family Medicine

## 2021-10-17 ENCOUNTER — Ambulatory Visit (INDEPENDENT_AMBULATORY_CARE_PROVIDER_SITE_OTHER): Payer: Medicare Other | Admitting: Family Medicine

## 2021-10-17 VITALS — BP 155/75 | HR 68 | Ht 60.0 in | Wt 210.0 lb

## 2021-10-17 DIAGNOSIS — G629 Polyneuropathy, unspecified: Secondary | ICD-10-CM

## 2021-10-17 DIAGNOSIS — G4733 Obstructive sleep apnea (adult) (pediatric): Secondary | ICD-10-CM

## 2021-10-17 DIAGNOSIS — R200 Anesthesia of skin: Secondary | ICD-10-CM | POA: Diagnosis not present

## 2021-10-17 DIAGNOSIS — G2581 Restless legs syndrome: Secondary | ICD-10-CM

## 2021-10-17 DIAGNOSIS — Z9989 Dependence on other enabling machines and devices: Secondary | ICD-10-CM

## 2021-11-03 ENCOUNTER — Other Ambulatory Visit: Payer: Self-pay | Admitting: Neurology

## 2021-11-04 NOTE — Telephone Encounter (Signed)
Received refill request for tramadol.  Last OV was on 10/17/21.  Next OV is scheduled for 04/22/22 .  Last RX was written on 10/05/21 for 60 tabs.   [place] [facility] Database has been reviewed.

## 2021-12-01 ENCOUNTER — Other Ambulatory Visit: Payer: Self-pay | Admitting: Neurology

## 2021-12-02 NOTE — Telephone Encounter (Signed)
Last OV was on 10/17/21.  ?Next OV is scheduled for 04/22/22.  ?Last RX was written on 11/04/21 for 60 tabs.  ? ?[place] [facility] Database has been reviewed.  ?

## 2021-12-05 ENCOUNTER — Other Ambulatory Visit (HOSPITAL_COMMUNITY): Payer: Self-pay

## 2021-12-11 ENCOUNTER — Telehealth: Payer: Self-pay

## 2021-12-11 DIAGNOSIS — D6851 Activated protein C resistance: Secondary | ICD-10-CM

## 2021-12-11 MED ORDER — APIXABAN 5 MG PO TABS: 5.0000 mg | ORAL_TABLET | Freq: Two times a day (BID) | ORAL | 2 refills | Status: DC

## 2021-12-11 NOTE — Telephone Encounter (Signed)
Received a letter from the pt requesting a printed prescription for Eliquis to go along with the patient assistance forms that have already been completed. The forms are requesting a 90 day supply with two refills. Rx printed and placed back in envelope to mail to addressed envelope.  ?

## 2022-01-02 ENCOUNTER — Other Ambulatory Visit: Payer: Self-pay | Admitting: Neurology

## 2022-01-09 ENCOUNTER — Other Ambulatory Visit: Payer: Self-pay | Admitting: Gastroenterology

## 2022-01-16 ENCOUNTER — Other Ambulatory Visit: Payer: Self-pay

## 2022-01-16 MED ORDER — BUDESONIDE-FORMOTEROL FUMARATE 160-4.5 MCG/ACT IN AERO: INHALATION_SPRAY | RESPIRATORY_TRACT | 3 refills | Status: DC

## 2022-01-16 NOTE — Addendum Note (Signed)
Addended by: Rodman Pickle on: 01/16/2022 01:31 PM ? ? Modules accepted: Orders ? ?

## 2022-01-16 NOTE — Telephone Encounter (Signed)
Patient has not been seen in >1 year. Scheduled to see me tomorrow 01/17/22. ?

## 2022-01-16 NOTE — Telephone Encounter (Signed)
Please print Incruse Ellipta Rx to be faxed to [facility] patient assistance  ?

## 2022-01-17 ENCOUNTER — Encounter: Payer: Self-pay | Admitting: Pulmonary Disease

## 2022-01-17 ENCOUNTER — Ambulatory Visit (INDEPENDENT_AMBULATORY_CARE_PROVIDER_SITE_OTHER): Payer: Medicare Other | Admitting: Pulmonary Disease

## 2022-01-17 VITALS — BP 126/70 | HR 77 | Temp 98.0°F | Ht 60.0 in | Wt 199.0 lb

## 2022-01-17 DIAGNOSIS — J449 Chronic obstructive pulmonary disease, unspecified: Secondary | ICD-10-CM

## 2022-01-17 NOTE — Progress Notes (Signed)
? ?'@Patient'$  ID: Whitney Chavez, female    DOB: 1956-01-11, 66 y.o.   MRN: [id number] ? ?Chief Complaint  ?Patient presents with  ? Follow-up  ?  Pt states that she is doing very well with the Symbicort and Incurse. Have to check on [place] and [employer] paperwork.   ? ? ?Referring provider: ?Kathyrn Lass, MD ? ?HPI: ?[name] is a 66 year old female former smoker (60-pack years), recurrent thromboembolic disease secondary to Factor V Leiden mutation and prothrombin II on anticoagulation, ESRD on HD, lymphedema, OSA and hx calciphylaxis who presents for follow-up for COPD. She reports she is overall doing well on Symbicort and Incruse. Denies fevers, chills, wheezing, shortness of breath. Has productive cough prior to dialysis. Symptoms improve after expelling sputum.  ? ?PFTs ?07/28/2019 FVC 1.59 (55%), FEV1 1.24 (56%), ratio 78, normal DLCO ?Moderate obstruction with positive BD response. Curvature to flow volume loop.  ? ?12/29/17 PFTs with MCT- FVC 1.73 (59%), FEV1 1.30 (58%), ratio 75 ?Moderate obstruction with positive hyper-reactive airway/ FEV1 declined by more than 20% ? ?Allergies  ?Allergen Reactions  ? Estrogens Other (See Comments)  ?  Blood clots  ? Other Other (See Comments)  ?  Unable to take birth control pills due to clotting disorder/fim  ? Prednisone Other (See Comments)  ?  Facial edema - has had this 2020 and had no difficulty ?"can take in small doses"  ? Progesterone Other (See Comments)  ?  Blood clots  ? Propofol Other (See Comments)  ?  Legs started thrashing and skin tears.  ? Lisinopril Cough  ? Clotrimazole Rash  ? Tape Rash  ?  Please use paper tape  ? ? ?Past Medical History:  ?Diagnosis Date  ? Asthma   ? Axillary vein thrombosis ([place]) 08/16/2015  ? Calciphylaxis   ? Chronic kidney disease   ? Colitis   ? COPD (chronic obstructive pulmonary disease) ([place])   ? Factor V Leiden ([place])   ? Hypertension   ? Lymphedema   ? legs  ? MRSA (methicillin resistant staph aureus) culture positive   ?  Neuropathy   ? Obstructive sleep apnea 03/29/2015  ? OSA on CPAP   ? Vision abnormalities   ? ? ?Outpatient Medications Prior to Visit  ?Medication Sig Dispense Refill  ? acetaminophen (TYLENOL) 325 MG tablet Take 325 mg by mouth daily as needed for headache (pain).     ? amiodarone (PACERONE) 200 MG tablet Take 200 mg by mouth daily.    ? apixaban (ELIQUIS) 5 MG TABS tablet Take 1 tablet (5 mg total) by mouth 2 (two) times daily. 180 tablet 2  ? atorvastatin (LIPITOR) 20 MG tablet Take 20 mg by mouth daily.     ? B Complex-C-Folic Acid (RENAL VITAMIN PO) Take 1 tablet by mouth daily.    ? Elastic Bandages & Supports (MEDICAL COMPRESSION STOCKINGS) MISC by Does not apply route. Leg wraps.    ? epoetin alfa (EPOGEN) 2000 UNIT/ML injection See admin instructions.    ? Etelcalcetide HCl (PARSABIV IV) Inject 1 Dose into the vein 3 (three) times a week. Receives at Dialysis    ? gabapentin (NEURONTIN) 100 MG capsule Take 1 capsule (100 mg total) by mouth 4 (four) times daily. 360 capsule 3  ? heparin 5000 UNIT/ML injection Inject 1 mL (5,000 Units total) into the skin 2 (two) times daily. To be given subcutaneously for 3 days prior to surgery on July 7th. 6 mL 0  ? INCRUSE  ELLIPTA 62.5 MCG/INH AEPB TAKE 1 PUFF BY MOUTH EVERY DAY 90 each 1  ? lanthanum (FOSRENOL) 1000 MG chewable tablet Chew 250-500 mg by mouth See admin instructions. Chew 1 tablet (500 mg) by mouth three times daily with meals, chew 1/4 tablet (250 mg) with snacks    ? levalbuterol (XOPENEX HFA) 45 MCG/ACT inhaler Inhale 2 puffs into the lungs every 6 (six) hours as needed for wheezing. 1 Inhaler 12  ? levalbuterol (XOPENEX) 0.63 MG/3ML nebulizer solution Take 3 mLs (0.63 mg total) by nebulization every 4 (four) hours as needed for wheezing or shortness of breath. 75 mL 11  ? omeprazole (PRILOSEC) 20 MG capsule Take 20 mg by mouth daily as needed (acid reflux). Rarely takes    ? PRESCRIPTION MEDICATION Inhale into the lungs at bedtime. CPAP with nasal  pillow    ? rOPINIRole (REQUIP) 0.5 MG tablet TAKE 1-2 TABLETS BY MOUTH IN THE MORNING. 180 tablet 4  ? rOPINIRole (REQUIP) 2 MG tablet Take 1 tablet (2 mg total) by mouth at bedtime. 90 tablet 3  ? SODIUM THIOSULFATE IV Inject 1 Dose into the vein 3 (three) times a week. Receives at Dialysis    ? sulfaSALAzine (AZULFIDINE) 500 MG tablet Take 1,000 mg by mouth 2 (two) times daily.    ? traMADol (ULTRAM) 50 MG tablet TAKE 1 TABLET (50 MG TOTAL) BY MOUTH 2 (TWO) TIMES DAILY AS NEEDED (PAIN). 60 tablet 0  ? triamcinolone cream (KENALOG) 0.1 % APPLY CREAM EXTERNALLY ONCE DAILY AS NEEDED FOR 30 DAYS    ? ?No facility-administered medications prior to visit.  ? ? ?Review of Systems ? ?Review of Systems  ?Constitutional:  Negative for chills, diaphoresis, fever, malaise/fatigue and weight loss.  ?HENT:  Negative for congestion.   ?Respiratory:  Negative for cough, hemoptysis, sputum production, shortness of breath and wheezing.   ?Cardiovascular:  Negative for chest pain, palpitations and leg swelling.  ? ?Blood pressure 126/70, pulse 77, temperature 98 ?F (36.7 ?C), temperature source Oral, height 5' (1.524 m), weight 199 lb (90.3 kg), SpO2 98 %. ? ?Physical Exam: ?General: Well-appearing, no acute distress ?HENT: [facility], AT ?Eyes: EOMI, no scleral icterus ?Respiratory: Clear to auscultation bilaterally.  No crackles, wheezing or rales ?Cardiovascular: RRR, -M/R/G, no JVD ?Extremities:-Edema,-tenderness ?Neuro: AAO x4, CNII-XII grossly intact ?Psych: Normal mood, normal affect ? ?Lab Results: ? ?CBC ?   ?Component Value Date/Time  ? WBC 6.3 08/27/2021 0908  ? WBC 6.8 05/24/2018 2123  ? RBC 3.63 (L) 08/27/2021 0908  ? HGB 11.0 (L) 08/27/2021 0908  ? HGB 9.9 (L) 06/25/2017 1146  ? HCT 34.5 (L) 08/27/2021 0908  ? HCT 32.3 (L) 06/25/2017 1146  ? PLT 172 08/27/2021 0908  ? PLT 202 06/25/2017 1146  ? MCV 95.0 08/27/2021 0908  ? MCV 92 06/25/2017 1146  ? MCH 30.3 08/27/2021 0908  ? MCHC 31.9 08/27/2021 0908  ? RDW 17.2 (H)  08/27/2021 0908  ? RDW 16.4 (H) 06/25/2017 1146  ? LYMPHSABS 0.9 08/27/2021 0908  ? LYMPHSABS 1.2 06/25/2017 1146  ? MONOABS 0.5 08/27/2021 0908  ? EOSABS 0.4 08/27/2021 0908  ? EOSABS 0.2 06/25/2017 1146  ? BASOSABS 0.0 08/27/2021 0908  ? BASOSABS 0.0 06/25/2017 1146  ? ? ?BMET ?   ?Component Value Date/Time  ? NA 140 08/27/2021 0908  ? NA 144 06/25/2017 1146  ? K 5.8 (H) 08/27/2021 0908  ? K 4.1 06/25/2017 1146  ? CL 90 (L) 08/27/2021 0908  ? CL 103 06/25/2017 1146  ?  CO2 24 08/27/2021 0908  ? CO2 31 06/25/2017 1146  ? GLUCOSE 94 08/27/2021 0908  ? GLUCOSE 119 (H) 06/25/2017 1146  ? BUN 35 (H) 08/27/2021 0908  ? BUN 27 (H) 06/25/2017 1146  ? CREATININE 4.82 (HH) 08/27/2021 0908  ? CREATININE 4.7 (HH) 06/25/2017 1146  ? CALCIUM 9.3 08/27/2021 0908  ? CALCIUM 9.7 06/25/2017 1146  ? GFRNONAA 9 (L) 08/27/2021 0908  ? GFRAA 10 (L) 01/12/2020 1610  ? ? ?BNP ?   ?Component Value Date/Time  ? BNP 434.6 (H) 10/23/2017 1058  ? ? ?ProBNP ?No results found for: PROBNP ? ?Imaging: ?No results found. ? ? ?Assessment & Plan:  ? ?No problem-specific Assessment & Plan notes found for this encounter. ? ?Moderately severe COPD (FEV1 58%) ?Asthma-COPD overlap ?Paperwork submitted for patient assistance ?--CONTINUE Symbicort TWO puffs TWICE a day ?--CONTINUE Incruse ONE puff a day ?--CONTINUE Albuterol TWO puffs as needed for shortness of breath or wheezing ? ?Eye redness - resolved ? ?Immunization History  ?Administered Date(s) Administered  ? Influenza-Unspecified 06/22/2015, 06/25/2016, 06/30/2019  ? Moderna Sars-Covid-2 Vaccination 12/01/2019, 12/29/2019  ? Pneumococcal Conjugate-13 11/27/2014  ? Pneumococcal Polysaccharide-23 07/11/2016  ? Zoster Recombinat (Shingrix) 04/11/2017, 08/17/2017  ? ?CT Lung screen - not qualified due to >15 years after smoking ? ?No orders of the defined types were placed in this encounter. ? ?No orders of the defined types were placed in this encounter. ? ?Return in about 6 months (around  07/20/2022). ? ?I have spent a total time of 35-minutes on the day of the appointment including chart review, data review, collecting history, coordinating care and discussing medical diagnosis and plan with the patient/fam

## 2022-01-17 NOTE — Patient Instructions (Signed)
Moderately severe COPD (FEV1 58%) ?Asthma-COPD overlap ?--CONTINUE Symbicort TWO puffs TWICE a day ?--CONTINUE Incruse ONE puff a day ?--CONTINUE Albuterol TWO puffs as needed for shortness of breath or wheezing ? ?Follow-up with me in 6 months ?

## 2022-01-21 ENCOUNTER — Telehealth: Payer: Self-pay | Admitting: Pulmonary Disease

## 2022-01-21 NOTE — Telephone Encounter (Signed)
Called and spoke with patient. She stated that she dropped off patient assistance forms 3 weeks ago for Symbicort and Incruse. She saw Dr. Loanne Drilling last week and was told that she should have been approved. She called [employer] and [employer] today. Neither one of them have received her paperwork. She is upset because the paperwork she dropped off contained PHI. I advised her that I would go downstairs and look for the paperwork. She verbalized understanding.  ? ?Found the paperwork in [facility]. I called the patient to let her know that I have found the paperwork. She is aware that I will make a copy of the paperwork in case [employer] and [employer] do not receive the 1st fax. I will mail her original documents back to her. She verbalized understanding.  ? ?Will keep this encounter open for follow up.  ?

## 2022-01-28 MED ORDER — BUDESONIDE-FORMOTEROL FUMARATE 160-4.5 MCG/ACT IN AERO: 2.0000 | INHALATION_SPRAY | Freq: Two times a day (BID) | RESPIRATORY_TRACT | 3 refills | Status: DC

## 2022-01-28 MED ORDER — INCRUSE ELLIPTA 62.5 MCG/ACT IN AEPB: 1.0000 | INHALATION_SPRAY | Freq: Every day | RESPIRATORY_TRACT | 3 refills | Status: DC

## 2022-01-28 NOTE — Telephone Encounter (Signed)
Will refax the Incruse application and mail the applications back to patient.  ?

## 2022-01-28 NOTE — Telephone Encounter (Signed)
Patient has update on patient assistance forms. Patient phone number is [phone]. ?

## 2022-01-28 NOTE — Telephone Encounter (Signed)
Spoke with the pt  ?She says that she called [place] and they never received application we faxed them for Incruse  ?It's not under her media tab yet in Epic  ?She says she called [employer] and that they are just needing prescription for Symbicort  ?She said it needs to be faxed to [facility] and Me at [phone] ? ?I have sent the rx for Symbicort  ? ?Cherina, do you know where the copy of pt assistance for Incruse is? ?If not we may need to ask pt to mail the original forms in? ?

## 2022-01-30 NOTE — Telephone Encounter (Signed)
[name] called stating she dropped off paperwork for assistance last month. Pt states she believes that her personal information has been lost, because she has not been able to get help yet. Pt is upset, cussing, and crying. Pt wants a call back [phone]

## 2022-01-30 NOTE — Telephone Encounter (Signed)
Called and spoke with patient. She stated that she hadn't called [place] nor [employer] since 01/27/22 so she is not sure if they have the information.I advised her that everything had been sent to both [place] and [employer] on 01/28/22 and I offered to refax everything. She verbalized understanding and will call both [place] and [employer]. She is also aware that I still have her paperwork to be placed in the mail since we have copies of everything.   Applications have been refaxed.   Nothing further needed at time of call.

## 2022-02-02 ENCOUNTER — Other Ambulatory Visit: Payer: Self-pay | Admitting: Neurology

## 2022-02-03 ENCOUNTER — Other Ambulatory Visit: Payer: Self-pay

## 2022-02-03 ENCOUNTER — Other Ambulatory Visit: Payer: Self-pay | Admitting: Neurology

## 2022-02-03 MED ORDER — TRAMADOL HCL 50 MG PO TABS: 50.0000 mg | ORAL_TABLET | Freq: Two times a day (BID) | ORAL | 3 refills | Status: DC | PRN

## 2022-02-03 MED ORDER — TRAMADOL HCL 50 MG PO TABS
50.0000 mg | ORAL_TABLET | Freq: Two times a day (BID) | ORAL | 3 refills | Status: DC | PRN
Start: 2022-02-03 — End: 2022-04-22

## 2022-02-03 NOTE — Progress Notes (Unsigned)
Last OV was on 10/17/21.  Next OV is scheduled for 04/22/22.  Last RX was written on 01/02/22 for 60 tabs.   [place] [facility] Database has been reviewed.

## 2022-02-25 ENCOUNTER — Other Ambulatory Visit: Payer: Self-pay | Admitting: Neurology

## 2022-02-25 ENCOUNTER — Inpatient Hospital Stay: Payer: Medicare Other | Attending: Hematology & Oncology

## 2022-02-25 ENCOUNTER — Other Ambulatory Visit (HOSPITAL_COMMUNITY): Payer: Self-pay

## 2022-02-25 ENCOUNTER — Other Ambulatory Visit: Payer: Self-pay | Admitting: Oncology

## 2022-02-25 ENCOUNTER — Telehealth: Payer: Self-pay

## 2022-02-25 ENCOUNTER — Encounter: Payer: Self-pay | Admitting: Hematology & Oncology

## 2022-02-25 ENCOUNTER — Inpatient Hospital Stay (HOSPITAL_BASED_OUTPATIENT_CLINIC_OR_DEPARTMENT_OTHER): Payer: Medicare Other | Admitting: Hematology & Oncology

## 2022-02-25 VITALS — BP 124/43 | HR 81 | Temp 98.4°F | Resp 18 | Ht 60.0 in | Wt 199.5 lb

## 2022-02-25 DIAGNOSIS — D649 Anemia, unspecified: Secondary | ICD-10-CM

## 2022-02-25 DIAGNOSIS — D6851 Activated protein C resistance: Secondary | ICD-10-CM | POA: Diagnosis present

## 2022-02-25 DIAGNOSIS — Z86718 Personal history of other venous thrombosis and embolism: Secondary | ICD-10-CM | POA: Diagnosis not present

## 2022-02-25 DIAGNOSIS — Z7901 Long term (current) use of anticoagulants: Secondary | ICD-10-CM | POA: Diagnosis not present

## 2022-02-25 LAB — CBC WITH DIFFERENTIAL (CANCER CENTER ONLY)
Abs Immature Granulocytes: 0.05 10*3/uL (ref 0.00–0.07)
Basophils Absolute: 0.1 10*3/uL (ref 0.0–0.1)
Basophils Relative: 1 %
Eosinophils Absolute: 0.2 10*3/uL (ref 0.0–0.5)
Eosinophils Relative: 2 %
HCT: 32.2 % — ABNORMAL LOW (ref 36.0–46.0)
Hemoglobin: 10.1 g/dL — ABNORMAL LOW (ref 12.0–15.0)
Immature Granulocytes: 1 %
Lymphocytes Relative: 9 %
Lymphs Abs: 0.8 10*3/uL (ref 0.7–4.0)
MCH: 30 pg (ref 26.0–34.0)
MCHC: 31.4 g/dL (ref 30.0–36.0)
MCV: 95.5 fL (ref 80.0–100.0)
Monocytes Absolute: 0.6 10*3/uL (ref 0.1–1.0)
Monocytes Relative: 7 %
Neutro Abs: 6.7 10*3/uL (ref 1.7–7.7)
Neutrophils Relative %: 80 %
Platelet Count: 157 10*3/uL (ref 150–400)
RBC: 3.37 MIL/uL — ABNORMAL LOW (ref 3.87–5.11)
RDW: 16.9 % — ABNORMAL HIGH (ref 11.5–15.5)
WBC Count: 8.4 10*3/uL (ref 4.0–10.5)
nRBC: 0 % (ref 0.0–0.2)

## 2022-02-25 LAB — CMP (CANCER CENTER ONLY)
ALT: 6 U/L (ref 0–44)
AST: 16 U/L (ref 15–41)
Albumin: 4.1 g/dL (ref 3.5–5.0)
Alkaline Phosphatase: 96 U/L (ref 38–126)
Anion gap: 24 — ABNORMAL HIGH (ref 5–15)
BUN: 31 mg/dL — ABNORMAL HIGH (ref 8–23)
CO2: 26 mmol/L (ref 22–32)
Calcium: 9.3 mg/dL (ref 8.9–10.3)
Chloride: 90 mmol/L — ABNORMAL LOW (ref 98–111)
Creatinine: 4.84 mg/dL (ref 0.44–1.00)
GFR, Estimated: 9 mL/min — ABNORMAL LOW (ref >60)
Glucose, Bld: 95 mg/dL (ref 70–99)
Potassium: 5.3 mmol/L — ABNORMAL HIGH (ref 3.5–5.1)
Sodium: 140 mmol/L (ref 135–145)
Total Bilirubin: 0.6 mg/dL (ref 0.3–1.2)
Total Protein: 7.1 g/dL (ref 6.5–8.1)

## 2022-02-25 LAB — LACTATE DEHYDROGENASE: LDH: 155 U/L (ref 98–192)

## 2022-02-25 MED ORDER — HEPARIN SODIUM (PORCINE) 5000 UNIT/ML IJ SOLN
5000.0000 [IU] | Freq: Two times a day (BID) | INTRAMUSCULAR | 0 refills | Status: DC
  Filled 2022-02-25: qty 4, 2d supply, fill #0

## 2022-02-25 MED ORDER — TRAMADOL HCL 50 MG PO TABS: 50.0000 mg | ORAL_TABLET | Freq: Two times a day (BID) | ORAL | 0 refills | Status: DC | PRN

## 2022-02-25 NOTE — Telephone Encounter (Signed)
Pt states she is leaving on 06-17 and will return on 07-01, this is FYI for [place], [place]

## 2022-02-25 NOTE — Telephone Encounter (Signed)
Pt last seen 10/17/21 and next f/u 04/22/22. Checked drug registry. Last refilled 02/03/22 #60 (30 days supply). Should have enough until 03/05/22.   LVM for pt to call office. Wanting to know when she is leaving on trip and when she will be back.

## 2022-02-25 NOTE — Progress Notes (Signed)
Hematology and Oncology Follow Up Visit  Whitney Chavez [id number] [birth year] 66 y.o. 02/25/2022   Principle Diagnosis:  Recurrent thromboembolic disease Heterozygous factor V Leiden mutation Prothrombin II gene mutation Calciphylaxis  Current Therapy:   Eliquis 5 mg po BID     Interim History:  Whitney Chavez is back for follow-up.  Every time that I see her, she looks better and better.  She really has done nicely since we last saw her.  I think what really has helped was that she had abdominal surgery back about a year ago.  This was removed these areas of calciphylaxis.  The plastic surgeon did a fantastic job.  The big news is that she is going go up to [place], [place].  She is going out for a couple weeks to visit family.  She will get her dialysis while she is up there.  When she comes back, she is going to need an upper endoscopy.  She wants to be on heparin as a "bridge" since being off Eliquis.  She likes to be on heparin.  We will go ahead and have her on heparin.  As GIST 5000 units twice daily for 2 days.  She is still wanting a kidney transplant.  Hopefully, she will be able to have a donor.  She has had no problems with bleeding.  She is on the Eliquis.  She has had no fever.  She has had no nausea or vomiting.  She has had no cough or shortness of breath.  She has had no rashes.  There is been no leg swelling.  Overall, I would say performance status is probably ECOG 1.    Medications:  Current Outpatient Medications:    amiodarone (PACERONE) 200 MG tablet, Take 200 mg by mouth daily., Disp: , Rfl:    apixaban (ELIQUIS) 5 MG TABS tablet, Take 1 tablet (5 mg total) by mouth 2 (two) times daily., Disp: 180 tablet, Rfl: 2   atorvastatin (LIPITOR) 20 MG tablet, Take 20 mg by mouth daily. , Disp: , Rfl:    B Complex-C-Folic Acid (RENAL VITAMIN PO), Take 1 tablet by mouth daily., Disp: , Rfl:    budesonide-formoterol (SYMBICORT) 160-4.5 MCG/ACT inhaler, Inhale 2 puffs into the  lungs in the morning and at bedtime., Disp: 30.6 g, Rfl: 3   Elastic Bandages & Supports (MEDICAL COMPRESSION STOCKINGS) MISC, by Does not apply route. Leg wraps., Disp: , Rfl:    epoetin alfa (EPOGEN) 2000 UNIT/ML injection, See admin instructions., Disp: , Rfl:    Etelcalcetide HCl (PARSABIV IV), Inject 1 Dose into the vein 3 (three) times a week. Receives at Dialysis, Disp: , Rfl:    gabapentin (NEURONTIN) 100 MG capsule, Take 1 capsule (100 mg total) by mouth 4 (four) times daily., Disp: 360 capsule, Rfl: 3   lanthanum (FOSRENOL) 1000 MG chewable tablet, Chew 250-500 mg by mouth See admin instructions. Chew 1 tablet (500 mg) by mouth three times daily with meals, chew 1/4 tablet (250 mg) with snacks, Disp: , Rfl:    levalbuterol (XOPENEX HFA) 45 MCG/ACT inhaler, Inhale 2 puffs into the lungs every 6 (six) hours as needed for wheezing., Disp: 1 Inhaler, Rfl: 12   omeprazole (PRILOSEC) 20 MG capsule, Take 20 mg by mouth daily as needed (acid reflux). Rarely takes, Disp: , Rfl:    PRESCRIPTION MEDICATION, Inhale into the lungs at bedtime. CPAP with nasal pillow, Disp: , Rfl:    rOPINIRole (REQUIP) 0.5 MG tablet, TAKE 1-2 TABLETS BY MOUTH IN  THE MORNING., Disp: 180 tablet, Rfl: 4   rOPINIRole (REQUIP) 2 MG tablet, Take 1 tablet (2 mg total) by mouth at bedtime., Disp: 90 tablet, Rfl: 3   SODIUM THIOSULFATE IV, Inject 1 Dose into the vein 3 (three) times a week. Receives at Dialysis, Disp: , Rfl:    sulfaSALAzine (AZULFIDINE) 500 MG tablet, Take 1,000 mg by mouth 2 (two) times daily., Disp: , Rfl:    traMADol (ULTRAM) 50 MG tablet, Take 1 tablet (50 mg total) by mouth 2 (two) times daily as needed (pain)., Disp: 60 tablet, Rfl: 3   umeclidinium bromide (INCRUSE ELLIPTA) 62.5 MCG/ACT AEPB, Inhale 1 puff into the lungs daily., Disp: 90 each, Rfl: 3   acetaminophen (TYLENOL) 325 MG tablet, Take 325 mg by mouth daily as needed for headache (pain).  (Patient not taking: Reported on 02/25/2022), Disp: , Rfl:     heparin 5000 UNIT/ML injection, Inject 1 mL (5,000 Units total) into the skin 2 (two) times daily. To be given subcutaneously for 3 days prior to surgery on July 7th., Disp: 4 mL, Rfl: 0   levalbuterol (XOPENEX) 0.63 MG/3ML nebulizer solution, Take 3 mLs (0.63 mg total) by nebulization every 4 (four) hours as needed for wheezing or shortness of breath. (Patient not taking: Reported on 02/25/2022), Disp: 75 mL, Rfl: 11   triamcinolone cream (KENALOG) 0.1 %, APPLY CREAM EXTERNALLY ONCE DAILY AS NEEDED FOR 30 DAYS (Patient not taking: Reported on 02/25/2022), Disp: , Rfl:   Allergies:  Allergies  Allergen Reactions   Estrogens Other (See Comments)    Blood clots   Other Other (See Comments)    Unable to take birth control pills due to clotting disorder/fim   Prednisone Other (See Comments)    Facial edema - has had this 2020 and had no difficulty "can take in small doses"   Progesterone Other (See Comments)    Blood clots   Propofol Other (See Comments)    Legs started thrashing and skin tears.   Lisinopril Cough   Clotrimazole Rash   Tape Rash    Please use paper tape    Past Medical History, Surgical history, Social history, and Family History were reviewed and updated.  Review of Systems: Review of Systems  Constitutional: Negative.   HENT: Negative.    Eyes: Negative.   Respiratory: Negative.    Cardiovascular: Negative.   Gastrointestinal: Negative.   Genitourinary: Negative.   Musculoskeletal: Negative.   Skin:  Positive for rash.  Neurological: Negative.   Endo/Heme/Allergies: Negative.   Psychiatric/Behavioral: Negative.       Physical Exam:  height is 5' (1.524 m) and weight is 199 lb 8 oz (90.5 kg). Her oral temperature is 98.4 F (36.9 C). Her blood pressure is 124/43 (abnormal) and her pulse is 81. Her respiration is 18 and oxygen saturation is 100%.   Wt Readings from Last 3 Encounters:  02/25/22 199 lb 8 oz (90.5 kg)  01/17/22 199 lb (90.3 kg)  10/17/21  210 lb (95.3 kg)     Physical Exam Vitals reviewed.  HENT:     Head: Normocephalic and atraumatic.  Eyes:     Pupils: Pupils are equal, round, and reactive to light.  Cardiovascular:     Rate and Rhythm: Normal rate and regular rhythm.     Heart sounds: Normal heart sounds.  Pulmonary:     Effort: Pulmonary effort is normal.     Breath sounds: Normal breath sounds.  Abdominal:     General: Bowel sounds  are normal.     Palpations: Abdomen is soft.     Comments: Abdominal exam shows an obese abdomen.  She has a healed laparotomy scar all through the abdomen from right to left side.  This is below the umbilicus.  I cannot palpate any obvious tumors from the calciphylaxis.    Musculoskeletal:        General: No tenderness or deformity. Normal range of motion.     Cervical back: Normal range of motion.     Comments: She has marked swelling of the right lower leg.  There is no erythema.  There is no tenderness to palpation.  She is a negative Homans sign.  Lymphadenopathy:     Cervical: No cervical adenopathy.  Skin:    General: Skin is warm and dry.     Findings: No erythema or rash.  Neurological:     Mental Status: She is alert and oriented to person, place, and time.  Psychiatric:        Behavior: Behavior normal.        Thought Content: Thought content normal.        Judgment: Judgment normal.     Lab Results  Component Value Date   WBC 8.4 02/25/2022   HGB 10.1 (L) 02/25/2022   HCT 32.2 (L) 02/25/2022   MCV 95.5 02/25/2022   PLT 157 02/25/2022     Chemistry      Component Value Date/Time   NA 140 02/25/2022 0921   NA 144 06/25/2017 1146   K 5.3 (H) 02/25/2022 0921   K 4.1 06/25/2017 1146   CL 90 (L) 02/25/2022 0921   CL 103 06/25/2017 1146   CO2 26 02/25/2022 0921   CO2 31 06/25/2017 1146   BUN 31 (H) 02/25/2022 0921   BUN 27 (H) 06/25/2017 1146   CREATININE 4.84 (HH) 02/25/2022 0921   CREATININE 4.7 (HH) 06/25/2017 1146      Component Value Date/Time    CALCIUM 9.3 02/25/2022 0921   CALCIUM 9.7 06/25/2017 1146   ALKPHOS 96 02/25/2022 0921   ALKPHOS 60 06/25/2017 1146   AST 16 02/25/2022 0921   ALT 6 02/25/2022 0921   ALT 14 06/25/2017 1146   BILITOT 0.6 02/25/2022 0921       Impression and Plan: Ms. Weitz is a 66 year old white female. She has history of recurrent thrombosis. She has both factor V Leiden and prothrombin II gene mutation.  She is on Eliquis.  Eliquis is doing very well for her.  She needs lifelong Eliquis.  Again, we will have her on the heparin just for couple days.  She will restart the Eliquis the day after her upper endoscopy.  Hopefully, she will be eligible for a kidney transplant.  I know she would really like to have this.  This would definitely improve her life.  I am sure that she will have a wonderful time up in [place] with her family.  We will plan to get her back in 6 more months.   Volanda Napoleon, MD 6/13/202310:46 AM

## 2022-02-25 NOTE — Telephone Encounter (Signed)
Notified by lab of critical creatinine of 4.84, MD notified

## 2022-02-25 NOTE — H&P (View-Only) (Signed)
Hematology and Oncology Follow Up Visit  ALEEN MARSTON [id number] February 24, 1956 66 y.o. 02/25/2022   Principle Diagnosis:  Recurrent thromboembolic disease Heterozygous factor V Leiden mutation Prothrombin II gene mutation Calciphylaxis  Current Therapy:   Eliquis 5 mg po BID     Interim History:  Ms. Roundy is back for follow-up.  Every time that I see her, she looks better and better.  She really has done nicely since we last saw her.  I think what really has helped was that she had abdominal surgery back about a year ago.  This was removed these areas of calciphylaxis.  The plastic surgeon did a fantastic job.  The big news is that she is going go up to [place], [place].  She is going out for a couple weeks to visit family.  She will get her dialysis while she is up there.  When she comes back, she is going to need an upper endoscopy.  She wants to be on heparin as a "bridge" since being off Eliquis.  She likes to be on heparin.  We will go ahead and have her on heparin.  As GIST 5000 units twice daily for 2 days.  She is still wanting a kidney transplant.  Hopefully, she will be able to have a donor.  She has had no problems with bleeding.  She is on the Eliquis.  She has had no fever.  She has had no nausea or vomiting.  She has had no cough or shortness of breath.  She has had no rashes.  There is been no leg swelling.  Overall, I would say performance status is probably ECOG 1.    Medications:  Current Outpatient Medications:    amiodarone (PACERONE) 200 MG tablet, Take 200 mg by mouth daily., Disp: , Rfl:    apixaban (ELIQUIS) 5 MG TABS tablet, Take 1 tablet (5 mg total) by mouth 2 (two) times daily., Disp: 180 tablet, Rfl: 2   atorvastatin (LIPITOR) 20 MG tablet, Take 20 mg by mouth daily. , Disp: , Rfl:    B Complex-C-Folic Acid (RENAL VITAMIN PO), Take 1 tablet by mouth daily., Disp: , Rfl:    budesonide-formoterol (SYMBICORT) 160-4.5 MCG/ACT inhaler, Inhale 2 puffs into the  lungs in the morning and at bedtime., Disp: 30.6 g, Rfl: 3   Elastic Bandages & Supports (MEDICAL COMPRESSION STOCKINGS) MISC, by Does not apply route. Leg wraps., Disp: , Rfl:    epoetin alfa (EPOGEN) 2000 UNIT/ML injection, See admin instructions., Disp: , Rfl:    Etelcalcetide HCl (PARSABIV IV), Inject 1 Dose into the vein 3 (three) times a week. Receives at Dialysis, Disp: , Rfl:    gabapentin (NEURONTIN) 100 MG capsule, Take 1 capsule (100 mg total) by mouth 4 (four) times daily., Disp: 360 capsule, Rfl: 3   lanthanum (FOSRENOL) 1000 MG chewable tablet, Chew 250-500 mg by mouth See admin instructions. Chew 1 tablet (500 mg) by mouth three times daily with meals, chew 1/4 tablet (250 mg) with snacks, Disp: , Rfl:    levalbuterol (XOPENEX HFA) 45 MCG/ACT inhaler, Inhale 2 puffs into the lungs every 6 (six) hours as needed for wheezing., Disp: 1 Inhaler, Rfl: 12   omeprazole (PRILOSEC) 20 MG capsule, Take 20 mg by mouth daily as needed (acid reflux). Rarely takes, Disp: , Rfl:    PRESCRIPTION MEDICATION, Inhale into the lungs at bedtime. CPAP with nasal pillow, Disp: , Rfl:    rOPINIRole (REQUIP) 0.5 MG tablet, TAKE 1-2 TABLETS BY MOUTH IN  THE MORNING., Disp: 180 tablet, Rfl: 4   rOPINIRole (REQUIP) 2 MG tablet, Take 1 tablet (2 mg total) by mouth at bedtime., Disp: 90 tablet, Rfl: 3   SODIUM THIOSULFATE IV, Inject 1 Dose into the vein 3 (three) times a week. Receives at Dialysis, Disp: , Rfl:    sulfaSALAzine (AZULFIDINE) 500 MG tablet, Take 1,000 mg by mouth 2 (two) times daily., Disp: , Rfl:    traMADol (ULTRAM) 50 MG tablet, Take 1 tablet (50 mg total) by mouth 2 (two) times daily as needed (pain)., Disp: 60 tablet, Rfl: 3   umeclidinium bromide (INCRUSE ELLIPTA) 62.5 MCG/ACT AEPB, Inhale 1 puff into the lungs daily., Disp: 90 each, Rfl: 3   acetaminophen (TYLENOL) 325 MG tablet, Take 325 mg by mouth daily as needed for headache (pain).  (Patient not taking: Reported on 02/25/2022), Disp: , Rfl:     heparin 5000 UNIT/ML injection, Inject 1 mL (5,000 Units total) into the skin 2 (two) times daily. To be given subcutaneously for 3 days prior to surgery on July 7th., Disp: 4 mL, Rfl: 0   levalbuterol (XOPENEX) 0.63 MG/3ML nebulizer solution, Take 3 mLs (0.63 mg total) by nebulization every 4 (four) hours as needed for wheezing or shortness of breath. (Patient not taking: Reported on 02/25/2022), Disp: 75 mL, Rfl: 11   triamcinolone cream (KENALOG) 0.1 %, APPLY CREAM EXTERNALLY ONCE DAILY AS NEEDED FOR 30 DAYS (Patient not taking: Reported on 02/25/2022), Disp: , Rfl:   Allergies:  Allergies  Allergen Reactions   Estrogens Other (See Comments)    Blood clots   Other Other (See Comments)    Unable to take birth control pills due to clotting disorder/fim   Prednisone Other (See Comments)    Facial edema - has had this 2020 and had no difficulty "can take in small doses"   Progesterone Other (See Comments)    Blood clots   Propofol Other (See Comments)    Legs started thrashing and skin tears.   Lisinopril Cough   Clotrimazole Rash   Tape Rash    Please use paper tape    Past Medical History, Surgical history, Social history, and Family History were reviewed and updated.  Review of Systems: Review of Systems  Constitutional: Negative.   HENT: Negative.    Eyes: Negative.   Respiratory: Negative.    Cardiovascular: Negative.   Gastrointestinal: Negative.   Genitourinary: Negative.   Musculoskeletal: Negative.   Skin:  Positive for rash.  Neurological: Negative.   Endo/Heme/Allergies: Negative.   Psychiatric/Behavioral: Negative.       Physical Exam:  height is 5' (1.524 m) and weight is 199 lb 8 oz (90.5 kg). Her oral temperature is 98.4 F (36.9 C). Her blood pressure is 124/43 (abnormal) and her pulse is 81. Her respiration is 18 and oxygen saturation is 100%.   Wt Readings from Last 3 Encounters:  02/25/22 199 lb 8 oz (90.5 kg)  01/17/22 199 lb (90.3 kg)  10/17/21  210 lb (95.3 kg)     Physical Exam Vitals reviewed.  HENT:     Head: Normocephalic and atraumatic.  Eyes:     Pupils: Pupils are equal, round, and reactive to light.  Cardiovascular:     Rate and Rhythm: Normal rate and regular rhythm.     Heart sounds: Normal heart sounds.  Pulmonary:     Effort: Pulmonary effort is normal.     Breath sounds: Normal breath sounds.  Abdominal:     General: Bowel sounds  are normal.     Palpations: Abdomen is soft.     Comments: Abdominal exam shows an obese abdomen.  She has a healed laparotomy scar all through the abdomen from right to left side.  This is below the umbilicus.  I cannot palpate any obvious tumors from the calciphylaxis.    Musculoskeletal:        General: No tenderness or deformity. Normal range of motion.     Cervical back: Normal range of motion.     Comments: She has marked swelling of the right lower leg.  There is no erythema.  There is no tenderness to palpation.  She is a negative Homans sign.  Lymphadenopathy:     Cervical: No cervical adenopathy.  Skin:    General: Skin is warm and dry.     Findings: No erythema or rash.  Neurological:     Mental Status: She is alert and oriented to person, place, and time.  Psychiatric:        Behavior: Behavior normal.        Thought Content: Thought content normal.        Judgment: Judgment normal.     Lab Results  Component Value Date   WBC 8.4 02/25/2022   HGB 10.1 (L) 02/25/2022   HCT 32.2 (L) 02/25/2022   MCV 95.5 02/25/2022   PLT 157 02/25/2022     Chemistry      Component Value Date/Time   NA 140 02/25/2022 0921   NA 144 06/25/2017 1146   K 5.3 (H) 02/25/2022 0921   K 4.1 06/25/2017 1146   CL 90 (L) 02/25/2022 0921   CL 103 06/25/2017 1146   CO2 26 02/25/2022 0921   CO2 31 06/25/2017 1146   BUN 31 (H) 02/25/2022 0921   BUN 27 (H) 06/25/2017 1146   CREATININE 4.84 (HH) 02/25/2022 0921   CREATININE 4.7 (HH) 06/25/2017 1146      Component Value Date/Time    CALCIUM 9.3 02/25/2022 0921   CALCIUM 9.7 06/25/2017 1146   ALKPHOS 96 02/25/2022 0921   ALKPHOS 60 06/25/2017 1146   AST 16 02/25/2022 0921   ALT 6 02/25/2022 0921   ALT 14 06/25/2017 1146   BILITOT 0.6 02/25/2022 0921       Impression and Plan: Ms. Markov is a 66 year old white female. She has history of recurrent thrombosis. She has both factor V Leiden and prothrombin II gene mutation.  She is on Eliquis.  Eliquis is doing very well for her.  She needs lifelong Eliquis.  Again, we will have her on the heparin just for couple days.  She will restart the Eliquis the day after her upper endoscopy.  Hopefully, she will be eligible for a kidney transplant.  I know she would really like to have this.  This would definitely improve her life.  I am sure that she will have a wonderful time up in [place] with her family.  We will plan to get her back in 6 more months.   Volanda Napoleon, MD 6/13/202310:46 AM

## 2022-02-25 NOTE — Telephone Encounter (Signed)
Pt is going to  [facility] for 2 weeks, she is asking for 12 days worth of traMADol (ULTRAM) 50 MG tablet (24 pills) to be called into [facility] [place], [place] [phone]

## 2022-02-25 NOTE — Addendum Note (Signed)
Addended by: Wyvonnia Lora on: 02/25/2022 01:28 PM   Modules accepted: Orders

## 2022-03-10 ENCOUNTER — Encounter (HOSPITAL_COMMUNITY): Payer: Self-pay | Admitting: Gastroenterology

## 2022-03-18 NOTE — Anesthesia Preprocedure Evaluation (Addendum)
Anesthesia Evaluation  Patient identified by MRN, date of birth, ID band Patient awake    Reviewed: Allergy & Precautions, NPO status , Patient's Chart, lab work & pertinent test results  Airway Mallampati: II  TM Distance: >3 FB Neck ROM: Full    Dental  (+) Teeth Intact, Dental Advisory Given   Pulmonary asthma , sleep apnea , COPD, former smoker,    Pulmonary exam normal breath sounds clear to auscultation       Cardiovascular hypertension, Normal cardiovascular exam+ Valvular Problems/Murmurs (mod AS) AS  Rhythm:Regular Rate:Normal  TTE 2020 1. The left ventricle has low normal systolic function, with an ejection  fraction of 50-55%. The cavity size was normal. There is moderate  concentric left ventricular hypertrophy. Left ventricular diastolic  Doppler parameters are consistent with  pseudonormalization. The E/e' is 26.6. There is Septal bounce is present.  No evidence of left ventricular regional wall motion abnormalities.  2. The right ventricle has normal systolic function. The cavity was  mildly enlarged. There is no increase in right ventricular wall thickness.  Right ventricular systolic pressure is mildly elevated with an estimated  pressure of 44.8 mmHg.  3. Left atrial size was moderately dilated.  4. Right atrial size was mildly dilated.  5. Mild thickening of the mitral valve leaflet. There is moderate mitral  annular calcification present.  6. The aortic valve has an indeterminate number of cusps Mild thickening  of the aortic valve Moderate calcification of the aortic valve. moderate  stenosis of the aortic valve.  7. Possibly functional bicuspid aortic valve due to calcification of the  left and non coronary commissure.  8. The inferior vena cava was dilated in size with <50% respiratory  variability   Neuro/Psych PSYCHIATRIC DISORDERS Depression negative neurological ROS     GI/Hepatic negative  GI ROS, Neg liver ROS,   Endo/Other  Morbid obesity  Renal/GU Dialysis and ESRFRenal disease  negative genitourinary   Musculoskeletal negative musculoskeletal ROS (+)   Abdominal   Peds  Hematology  (+) Blood dyscrasia (Factor V Leiden), ,   Anesthesia Other Findings   Reproductive/Obstetrics                          Anesthesia Physical Anesthesia Plan  ASA: 3  Anesthesia Plan: MAC   Post-op Pain Management: Precedex   Induction: Intravenous  PONV Risk Score and Plan: 2  Airway Management Planned: Natural Airway  Additional Equipment:   Intra-op Plan:   Post-operative Plan:   Informed Consent: I have reviewed the patients History and Physical, chart, labs and discussed the procedure including the risks, benefits and alternatives for the proposed anesthesia with the patient or authorized representative who has indicated his/her understanding and acceptance.     Dental advisory given  Plan Discussed with: CRNA  Anesthesia Plan Comments: (Per patient, received propofol during outpatient GI procedures and RLS exacerbated. Per Dr. Paulita Fujita, patient became disinhibited during outpatient procedures; propofol not a true allergy. Pt received propofol in 2019 without any complications. Patient and Dr. Paulita Fujita amenable to proceeding today with propofol. )      Anesthesia Quick Evaluation

## 2022-03-19 ENCOUNTER — Encounter (HOSPITAL_COMMUNITY): Payer: Self-pay | Admitting: Gastroenterology

## 2022-03-19 ENCOUNTER — Ambulatory Visit (HOSPITAL_COMMUNITY): Payer: Medicare Other | Admitting: Anesthesiology

## 2022-03-19 ENCOUNTER — Encounter (HOSPITAL_COMMUNITY)
Admission: RE | Disposition: A | Discharge status: Home / Self Care | Payer: Self-pay | Source: Home / Self Care | Attending: Gastroenterology

## 2022-03-19 ENCOUNTER — Ambulatory Visit (HOSPITAL_BASED_OUTPATIENT_CLINIC_OR_DEPARTMENT_OTHER): Payer: Medicare Other | Admitting: Anesthesiology

## 2022-03-19 ENCOUNTER — Ambulatory Visit (HOSPITAL_COMMUNITY)
Admission: RE | Admit: 2022-03-19 | Discharge: 2022-03-19 | Disposition: A | Discharge status: Home / Self Care | Payer: Medicare Other | Attending: Gastroenterology | Admitting: Gastroenterology

## 2022-03-19 ENCOUNTER — Other Ambulatory Visit: Payer: Self-pay

## 2022-03-19 DIAGNOSIS — Z992 Dependence on renal dialysis: Secondary | ICD-10-CM | POA: Diagnosis not present

## 2022-03-19 DIAGNOSIS — R112 Nausea with vomiting, unspecified: Secondary | ICD-10-CM | POA: Diagnosis not present

## 2022-03-19 DIAGNOSIS — G4730 Sleep apnea, unspecified: Secondary | ICD-10-CM | POA: Insufficient documentation

## 2022-03-19 DIAGNOSIS — N186 End stage renal disease: Secondary | ICD-10-CM | POA: Diagnosis not present

## 2022-03-19 DIAGNOSIS — I120 Hypertensive chronic kidney disease with stage 5 chronic kidney disease or end stage renal disease: Secondary | ICD-10-CM

## 2022-03-19 DIAGNOSIS — D6851 Activated protein C resistance: Secondary | ICD-10-CM | POA: Diagnosis not present

## 2022-03-19 DIAGNOSIS — J449 Chronic obstructive pulmonary disease, unspecified: Secondary | ICD-10-CM | POA: Diagnosis not present

## 2022-03-19 DIAGNOSIS — Z87891 Personal history of nicotine dependence: Secondary | ICD-10-CM | POA: Diagnosis not present

## 2022-03-19 DIAGNOSIS — Z6839 Body mass index (BMI) 39.0-39.9, adult: Secondary | ICD-10-CM | POA: Insufficient documentation

## 2022-03-19 HISTORY — PX: ESOPHAGOGASTRODUODENOSCOPY (EGD) WITH PROPOFOL: SHX5813

## 2022-03-19 LAB — POCT I-STAT, CHEM 8
BUN: 41 mg/dL — ABNORMAL HIGH (ref 8–23)
Calcium, Ion: 0.96 mmol/L — ABNORMAL LOW (ref 1.15–1.40)
Chloride: 100 mmol/L (ref 98–111)
Creatinine, Ser: 6.8 mg/dL — ABNORMAL HIGH (ref 0.44–1.00)
Glucose, Bld: 81 mg/dL (ref 70–99)
HCT: 31 % — ABNORMAL LOW (ref 36.0–46.0)
Hemoglobin: 10.5 g/dL — ABNORMAL LOW (ref 12.0–15.0)
Potassium: 4.6 mmol/L (ref 3.5–5.1)
Sodium: 137 mmol/L (ref 135–145)
TCO2: 25 mmol/L (ref 22–32)

## 2022-03-19 SURGERY — ESOPHAGOGASTRODUODENOSCOPY (EGD) WITH PROPOFOL
Anesthesia: Monitor Anesthesia Care | Laterality: Bilateral

## 2022-03-19 MED ORDER — MIDAZOLAM HCL 5 MG/5ML IJ SOLN
INTRAMUSCULAR | Status: DC | PRN
  Administered 2022-03-19: 2 mg via INTRAVENOUS

## 2022-03-19 MED ORDER — SODIUM CHLORIDE 0.9 % IV SOLN: INTRAVENOUS | Status: DC

## 2022-03-19 MED ORDER — LACTATED RINGERS IV SOLN
INTRAVENOUS | Status: AC | PRN
  Administered 2022-03-19: 1000 mL via INTRAVENOUS

## 2022-03-19 MED ORDER — PROPOFOL 500 MG/50ML IV EMUL
INTRAVENOUS | Status: DC | PRN
  Administered 2022-03-19: 75 ug/kg/min via INTRAVENOUS
  Administered 2022-03-19: 100 ug/kg/min via INTRAVENOUS

## 2022-03-19 MED ORDER — MIDAZOLAM HCL 2 MG/2ML IJ SOLN
INTRAMUSCULAR | Status: AC
  Filled 2022-03-19: qty 2

## 2022-03-19 MED ORDER — DEXMEDETOMIDINE HCL IN NACL 200 MCG/50ML IV SOLN
INTRAVENOUS | Status: DC | PRN
  Administered 2022-03-19: 4 ug via INTRAVENOUS

## 2022-03-19 MED ORDER — DEXMEDETOMIDINE HCL IN NACL 80 MCG/20ML IV SOLN
INTRAVENOUS | Status: AC
  Filled 2022-03-19: qty 20

## 2022-03-19 SURGICAL SUPPLY — 15 items

## 2022-03-19 NOTE — Transfer of Care (Signed)
Immediate Anesthesia Transfer of Care Note  Patient: Whitney Chavez  Procedure(s) Performed: ESOPHAGOGASTRODUODENOSCOPY (EGD) WITH PROPOFOL (Bilateral)  Patient Location: PACU  Anesthesia Type:MAC  Level of Consciousness: awake  Airway & Oxygen Therapy: Patient Spontanous Breathing  Post-op Assessment: Report given to RN  Post vital signs: stable  Last Vitals:  Vitals Value Taken Time  BP 162/63 03/19/22 0911  Temp 36.3 C 03/19/22 0910  Pulse 87 03/19/22 0918  Resp 28 03/19/22 0918  SpO2 92 % 03/19/22 0918  Vitals shown include unvalidated device data.  Last Pain:  Vitals:   03/19/22 0910  TempSrc: Temporal  PainSc: 0-No pain         Complications: No notable events documented.

## 2022-03-19 NOTE — Discharge Instructions (Signed)
Endoscopy °Care After °Please read the instructions outlined below and refer to this sheet in the next few weeks. These discharge instructions provide you with general information on caring for yourself after you leave the hospital. Your doctor may also give you specific instructions. While your treatment has been planned according to the most current medical practices available, unavoidable complications occasionally occur. If you have any problems or questions after discharge, please call Dr. Keyion Knack ([facility]) at [phone]. ° °HOME CARE INSTRUCTIONS °Activity °· You may resume your regular activity but move at a slower pace for the next 24 hours.  °· Take frequent rest periods for the next 24 hours.  °· Walking will help expel (get rid of) the air and reduce the bloated feeling in your abdomen.  °· No driving for 24 hours (because of the anesthesia (medicine) used during the test).  °· You may shower.  °· Do not sign any important legal documents or operate any machinery for 24 hours (because of the anesthesia used during the test).  °Nutrition °· Drink plenty of fluids.  °· You may resume your normal diet.  °· Begin with a light meal and progress to your normal diet.  °· Avoid alcoholic beverages for 24 hours or as instructed by your caregiver.  °Medications °You may resume your normal medications unless your caregiver tells you otherwise. °What you can expect today °· You may experience abdominal discomfort such as a feeling of fullness or "gas" pains.  °· You may experience a sore throat for 2 to 3 days. This is normal. Gargling with salt water may help this.  °·  °SEEK IMMEDIATE MEDICAL CARE IF: °· You have excessive nausea (feeling sick to your stomach) and/or vomiting.  °· You have severe abdominal pain and distention (swelling).  °· You have trouble swallowing.  °· You have a temperature over 100° F (37.8° C).  °· You have rectal bleeding or vomiting of blood.  °Document Released:  04/15/2004 Document Revised: 05/14/2011 Document Reviewed: 10/27/2007 °ExitCare® Patient Information ©2012 [employer]. °

## 2022-03-19 NOTE — Interval H&P Note (Signed)
History and Physical Interval Note:  03/19/2022 8:28 AM  Whitney Chavez  has presented today for surgery, with the diagnosis of nausea and vomiting, abdominal pain.  The various methods of treatment have been discussed with the patient and family. After consideration of risks, benefits and other options for treatment, the patient has consented to  Procedure(s): ESOPHAGOGASTRODUODENOSCOPY (EGD) WITH PROPOFOL (Bilateral) as a surgical intervention.  The patient's history has been reviewed, patient examined, no change in status, stable for surgery.  I have reviewed the patient's chart and labs.  Questions were answered to the patient's satisfaction.     Whitney Chavez M  Assessment:    Recurrent nausea/vomiting.  Improving.  Plan:  Assess today with endoscopy. Off anticoagulant (Eliquis) x 2 days. Risks (bleeding, infection, bowel perforation that could require surgery, sedation-related changes in cardiopulmonary systems), benefits (identification and possible treatment of source of symptoms, exclusion of certain causes of symptoms), and alternatives (watchful waiting, radiographic imaging studies, empiric medical treatment) of upper endoscopy (EGD) were explained to patient/family in detail and patient wishes to proceed.

## 2022-03-19 NOTE — Anesthesia Postprocedure Evaluation (Addendum)
Anesthesia Post Note  Patient: Whitney Chavez  Procedure(s) Performed: ESOPHAGOGASTRODUODENOSCOPY (EGD) WITH PROPOFOL (Bilateral)     Patient location during evaluation: Endoscopy Anesthesia Type: MAC Level of consciousness: awake and alert Pain management: pain level controlled Vital Signs Assessment: post-procedure vital signs reviewed and stable Respiratory status: spontaneous breathing, nonlabored ventilation, respiratory function stable and patient connected to nasal cannula oxygen Cardiovascular status: blood pressure returned to baseline and stable Postop Assessment: no apparent nausea or vomiting Anesthetic complications: no Comments: RLS was exacerabated after procedure. Unclear if this is related to propofol. Pt did not take RLS medications this morning. Pt has an upcoming colonoscopy and has requested an alternate form of sedation be used if possible.    No notable events documented.  Last Vitals:  Vitals:   03/19/22 0910 03/19/22 0920  BP: (!) 162/63 123/87  Pulse: 89 80  Resp: (!) 25 18  Temp: (!) 36.3 C   SpO2: 95% 97%    Last Pain:  Vitals:   03/19/22 0920  TempSrc:   PainSc: 0-No pain                 Whitney Chavez

## 2022-03-19 NOTE — Op Note (Signed)
[facility] Patient Name: Whitney Chavez Procedure Date: 03/19/2022 MRN: [id number] Attending MD: Arta Silence , MD Date of Birth: 12-22-1955 CSN: [id number] Age: 66 Admit Type: Outpatient Procedure:                Upper GI endoscopy Indications:              Nausea with vomiting Providers:                Arta Silence, MD, Allayne Gitelman, RN, Frazier Richards,                            Technician Referring MD:             Kathyrn Lass, MD Medicines:                Monitored Anesthesia Care Complications:            No immediate complications. Estimated Blood Loss:     Estimated blood loss: none. Procedure:                Pre-Anesthesia Assessment:                           - Prior to the procedure, a History and Physical                            was performed, and patient medications and                            allergies were reviewed. The patient's tolerance of                            previous anesthesia was also reviewed. The risks                            and benefits of the procedure and the sedation                            options and risks were discussed with the patient.                            All questions were answered, and informed consent                            was obtained. Prior Anticoagulants: The patient has                            taken Eliquis (apixaban), last dose was 2 days                            prior to procedure. ASA Grade Assessment: III - A                            patient with severe systemic disease. After  reviewing the risks and benefits, the patient was                            deemed in satisfactory condition to undergo the                            procedure.                           After obtaining informed consent, the endoscope was                            passed under direct vision. Throughout the                            procedure, the patient's blood pressure, pulse, and                             oxygen saturations were monitored continuously. The                            [id number]-[id number] ([id number] Olympus endoscope was introduced                            through the mouth, and advanced to the second part                            of duodenum. The upper GI endoscopy was                            accomplished without difficulty. The patient                            tolerated the procedure well. Scope In: Scope Out: Findings:      The examined esophagus was normal.      The entire examined stomach was normal.      The duodenal bulb, first portion of the duodenum and second portion of       the duodenum were normal. Impression:               - Normal esophagus.                           - Normal stomach.                           - Normal duodenal bulb, first portion of the                            duodenum and second portion of the duodenum.                           - No specimens collected. Moderate Sedation:      None Recommendation:           - Discharge patient to home (via wheelchair).                           -  Resume previous diet today.                           - Continue present medications.                           - Ok to resume Heparin and Eliquis today.                           - Return to GI clinic in 4 weeks.                           - Return to referring physician as previously                            scheduled. Procedure Code(s):        --- Professional ---                           [phone], Esophagogastroduodenoscopy, flexible,                            transoral; diagnostic, including collection of                            specimen(s) by brushing or washing, when performed                            (separate procedure) Diagnosis Code(s):        --- Professional ---                           R11.2, Nausea with vomiting, unspecified CPT copyright 2019 [employer]. All rights reserved. The codes documented  in this report are preliminary and upon coder review may  be revised to meet current compliance requirements. Arta Silence, MD 03/19/2022 8:47:06 AM This report has been signed electronically. Number of Addenda: 0

## 2022-03-20 ENCOUNTER — Encounter (HOSPITAL_COMMUNITY): Payer: Self-pay | Admitting: Gastroenterology

## 2022-04-22 ENCOUNTER — Encounter: Payer: Self-pay | Admitting: Neurology

## 2022-04-22 ENCOUNTER — Ambulatory Visit (INDEPENDENT_AMBULATORY_CARE_PROVIDER_SITE_OTHER): Payer: Medicare Other | Admitting: Neurology

## 2022-04-22 VITALS — BP 122/64 | HR 71 | Ht 59.0 in | Wt 197.5 lb

## 2022-04-22 DIAGNOSIS — G4733 Obstructive sleep apnea (adult) (pediatric): Secondary | ICD-10-CM | POA: Diagnosis not present

## 2022-04-22 DIAGNOSIS — R269 Unspecified abnormalities of gait and mobility: Secondary | ICD-10-CM | POA: Diagnosis not present

## 2022-04-22 DIAGNOSIS — G629 Polyneuropathy, unspecified: Secondary | ICD-10-CM | POA: Diagnosis not present

## 2022-04-22 DIAGNOSIS — Z992 Dependence on renal dialysis: Secondary | ICD-10-CM

## 2022-04-22 DIAGNOSIS — G2581 Restless legs syndrome: Secondary | ICD-10-CM | POA: Diagnosis not present

## 2022-04-22 DIAGNOSIS — Z9989 Dependence on other enabling machines and devices: Secondary | ICD-10-CM

## 2022-04-22 MED ORDER — TRAMADOL HCL 50 MG PO TABS: 50.0000 mg | ORAL_TABLET | Freq: Two times a day (BID) | ORAL | 3 refills | Status: DC | PRN

## 2022-04-22 MED ORDER — ROPINIROLE HCL 0.5 MG PO TABS
ORAL_TABLET | ORAL | 4 refills | Status: DC
Start: 2022-04-22 — End: 2022-10-30

## 2022-04-22 MED ORDER — GABAPENTIN 100 MG PO CAPS: 100.0000 mg | ORAL_CAPSULE | Freq: Four times a day (QID) | ORAL | 3 refills | Status: DC

## 2022-04-22 MED ORDER — ROPINIROLE HCL 2 MG PO TABS: 2.0000 mg | ORAL_TABLET | Freq: Every day | ORAL | 3 refills | Status: DC

## 2022-04-22 NOTE — Progress Notes (Signed)
[facility]  PATIENT: Whitney Chavez DOB: April 16, 1956  REFERRING DOCTOR OR PCP:  none SOURCE: patient and records from [facility]  _________________________________   HISTORICAL  CHIEF COMPLAINT:  Chief Complaint  Patient presents with   Follow-up    Rm 2, alone. Here to f/u for CPAP and polyneuropathy. Neuropathy has been bothering pt a bit more. RLS has gotten worse as well. Pt is on dialysis and loosing weight.    Obstructive Sleep Apnea    Pt reports doing well on CPAP. Pt would like to change DME to [facility].     HISTORY OF PRESENT ILLNESS:  Whitney Chavez is a 66 year old woman with Obstructive sleep apnea , insomnia, restless leg syndrome and polyneuropathy.      Update 04/22/2022: She is using CPAP nightly and can' sleep well without it.   Download shows compliance was 93% and efficacy was good (AHI = 1.6).     She has more RLS symptoms during HD and at night.  She takes ropinirole 1/2 pill before HD and 1/2 pill afterwards and at bedtime.  That and gabapentin have helped.  Tramadol 50 mg po bid has helped.     She takes gabapentin 300 mg a day (100 in pm and 200 at bedtime) and ropinirole 0.5 mg in pm and 2 mg at bedtime)   She is on HD for ESRD and is trying to get on the transplant list (was able to lose weight).   She takes additional ropinirole 0.5 mg before HD.      Neuropathy pain has done better with the gabapentin and tramadol.     She had calciphylaxis due to HD (x 11+ years)  and required surgery .    She has mild EDS.  EPWORTH SLEEPINESS SCALE  On a scale of 0 - 3 what is the chance of dozing:  Sitting and Reading:   3 Watching TV:    2 Sitting inactive in a public place: 2 Passenger in car for one hour: 2 Lying down to rest in the afternoon: 2 Sitting and talking to someone: 1 Sitting quietly after lunch:  1 In a car, stopped in traffic:  0  Total (out of 24):  13/24   Mild EDS (generally worse on HD days and better on  non-HD days)       She has had vomiting but EGD showed no problems.   She is seeing GI.   She takes Zofran on dialysis days.    REVIEW OF SYSTEMS: Constitutional: No fevers, chills, sweats, or change in appetite Eyes: No visual changes, double vision, eye pain Ear, nose and throat: No hearing loss, ear pain, nasal congestion, sore throat Cardiovascular: No chest pain, palpitations Respiratory:  No shortness of breath at rest or with exertion.   No wheezes GastrointestinaI: She has had vomiting, not always associated with dialysis though usually worse on those days Genitourinary:  She is on hemodialysis. Musculoskeletal:  No neck pain, back pain Integumentary: No rash, pruritus, skin lesions Neurological: as above Psychiatric: No depression at this time.  No anxiety Endocrine: No palpitations, diaphoresis, change in appetite, change in weigh or increased thirst Hematologic/Lymphatic:  No anemia, purpura, petechiae. Allergic/Immunologic: No itchy/runny eyes, nasal congestion, recent allergic reactions, rashes  ALLERGIES: Allergies  Allergen Reactions   Estrogens Other (See Comments)    Blood clots   Other Other (See Comments)    Unable to take birth control pills due to clotting disorder/fim   Prednisone Other (See Comments)  Facial edema - has had this 2020 and had no difficulty "can take in small doses"   Progesterone Other (See Comments)    Blood clots   Propofol Other (See Comments)    Became disinhibited and exacerbated restless leg syndrome during EGD/colonoscopy. Patient has requested an alternate form of sedation during future procedures.    Lisinopril Cough   Clotrimazole Rash   Tape Rash    Please use paper tape    HOME MEDICATIONS:  Current Outpatient Medications:    acetaminophen (TYLENOL) 500 MG tablet, Take 500 mg by mouth every 6 (six) hours as needed for moderate pain., Disp: , Rfl:    amiodarone (PACERONE) 200 MG tablet, Take 200 mg by mouth daily., Disp:  , Rfl:    atorvastatin (LIPITOR) 20 MG tablet, Take 20 mg by mouth every evening., Disp: , Rfl:    B Complex-C-Folic Acid (RENAL VITAMIN PO), Take 1 tablet by mouth daily., Disp: , Rfl:    budesonide-formoterol (SYMBICORT) 160-4.5 MCG/ACT inhaler, Inhale 2 puffs into the lungs in the morning and at bedtime., Disp: 30.6 g, Rfl: 3   cetirizine (ZYRTEC) 10 MG tablet, Take 10 mg by mouth daily., Disp: , Rfl:    Elastic Bandages & Supports ([place]) MISC, by Does not apply route. Leg wraps., Disp: , Rfl:    epoetin alfa (EPOGEN) 2000 UNIT/ML injection, See admin instructions., Disp: , Rfl:    Etelcalcetide HCl (PARSABIV IV), Inject 1 Dose into the vein 3 (three) times a week. Receives at Dialysis, Disp: , Rfl:    gabapentin (NEURONTIN) 100 MG capsule, Take 1 capsule (100 mg total) by mouth 4 (four) times daily. (Patient taking differently: Take 100-300 mg by mouth See admin instructions. Take 100 mg at 3 pm and 200-300 at 9 pm), Disp: 360 capsule, Rfl: 3   heparin 5000 UNIT/ML injection, Inject 1 mL (5,000 Units total) into the skin 2 (two) times daily. To be given subcutaneously for 3 days prior to surgery on July 7th., Disp: 4 mL, Rfl: 0   lanthanum (FOSRENOL) 1000 MG chewable tablet, Chew 500-1,000 mg by mouth See admin instructions. Take 1000 mg with each meal and 500 mg with each snack, Disp: , Rfl:    levalbuterol (XOPENEX HFA) 45 MCG/ACT inhaler, Inhale 2 puffs into the lungs every 6 (six) hours as needed for wheezing., Disp: 1 Inhaler, Rfl: 12   omeprazole (PRILOSEC) 20 MG capsule, Take 20 mg by mouth daily as needed (acid reflux). Rarely takes, Disp: , Rfl:    ondansetron (ZOFRAN-ODT) 4 MG disintegrating tablet, Take 4 mg by mouth every 8 (eight) hours as needed for nausea or vomiting., Disp: , Rfl:    PRESCRIPTION MEDICATION, Inhale into the lungs at bedtime. CPAP with nasal pillow, Disp: , Rfl:    rOPINIRole (REQUIP) 0.5 MG tablet, TAKE 1-2 TABLETS BY MOUTH IN THE MORNING.  (Patient taking differently: Take 0.5 mg by mouth daily in the afternoon.), Disp: 180 tablet, Rfl: 4   rOPINIRole (REQUIP) 2 MG tablet, Take 1 tablet (2 mg total) by mouth at bedtime., Disp: 90 tablet, Rfl: 3   SODIUM THIOSULFATE IV, Inject 1 Dose into the vein 3 (three) times a week. Receives at Dialysis, Disp: , Rfl:    sulfaSALAzine (AZULFIDINE) 500 MG tablet, Take 1,000 mg by mouth 2 (two) times daily., Disp: , Rfl:    traMADol (ULTRAM) 50 MG tablet, Take 1 tablet (50 mg total) by mouth 2 (two) times daily as needed (pain)., Disp: 60 tablet, Rfl:  3   triamcinolone cream (KENALOG) 0.1 %, Apply 1 Application topically daily as needed (itching)., Disp: , Rfl:    umeclidinium bromide (INCRUSE ELLIPTA) 62.5 MCG/ACT AEPB, Inhale 1 puff into the lungs daily., Disp: 90 each, Rfl: 3   apixaban (ELIQUIS) 5 MG TABS tablet, Take 1 tablet (5 mg total) by mouth 2 (two) times daily., Disp: 180 tablet, Rfl: 2  PAST MEDICAL HISTORY: Past Medical History:  Diagnosis Date   Asthma    Axillary vein thrombosis ([place]) 08/16/2015   Calciphylaxis    Chronic kidney disease    Colitis    COPD (chronic obstructive pulmonary disease) (HCC)    Factor V Leiden (HCC)    Hypertension    Lymphedema    legs   MRSA (methicillin resistant staph aureus) culture positive    Neuropathy    Obstructive sleep apnea 03/29/2015   OSA on CPAP    Vision abnormalities     PAST SURGICAL HISTORY: Past Surgical History:  Procedure Laterality Date   APPLICATION OF WOUND VAC     AV FISTULA PLACEMENT Left    ESOPHAGOGASTRODUODENOSCOPY (EGD) WITH PROPOFOL Bilateral 03/19/2022   Procedure: ESOPHAGOGASTRODUODENOSCOPY (EGD) WITH PROPOFOL;  Surgeon: Arta Silence, MD;  Location: [facility];  Service: Gastroenterology;  Laterality: Bilateral;   lumbar decompression fusion     TONSILLECTOMY      FAMILY HISTORY: Family History  Problem Relation Age of Onset   Diabetes Mother    Congestive Heart Failure Mother    COPD Mother     Stroke Mother    Neuropathy Mother    Heart disease Father    Stroke Father    Cancer Brother    Diabetes Brother    Diabetes Brother    Alcohol abuse Brother     SOCIAL HISTORY:  Social History   Socioeconomic History   Marital status: Single    Spouse name: Not on file   Number of children: Not on file   Years of education: Not on file   Highest education level: Not on file  Occupational History   Not on file  Tobacco Use   Smoking status: Former    Packs/day: 2.00    Years: 30.00    Total pack years: 60.00    Types: Cigarettes    Quit date: 11/25/1998    Years since quitting: 23.4   Smokeless tobacco: Never  Vaping Use   Vaping Use: Never used  Substance and Sexual Activity   Alcohol use: No    Alcohol/week: 0.0 standard drinks of alcohol   Drug use: No   Sexual activity: Not on file  Other Topics Concern   Not on file  Social History Narrative   Not on file   Social Determinants of Health   Financial Resource Strain: Not on file  Food Insecurity: Not on file  Transportation Needs: Not on file  Physical Activity: Not on file  Stress: Not on file  Social Connections: Not on file  Intimate Partner Violence: Not on file     PHYSICAL EXAM  Vitals:   04/22/22 1050  BP: 122/64  Pulse: 71  Weight: 197 lb 8 oz (89.6 kg)  Height: '4\' 11"'$  (1.499 m)    Body mass index is 39.89 kg/m.   General: The patient is well-developed and well-nourished and in no acute distress.   She has Lymphedema and legs are wrapped   Neurologic Exam  Mental status: The patient is alert and oriented x 3 at the time of the examination.  The patient has apparent normal recent and remote memory, with an apparently normal attention span and concentration ability.   Speech is normal.  Cranial nerves: Extraocular muscles are intact. Facial strength and sensation is normal.  Hearing is symmetric.    Motor:  Mild essential tremor in hands.  Muscle bulk is normal.   Tone is normal.  Strength is normal in the arms. Strength is 4+/5 in the foot and ankle extensors  Sensory: She has reduced sensation to vibration at the ankles and absent at toes  There is reduced sensation to touch from the ankles down.  Coordination: Cerebellar testing reveals good finger-nose-finger bilaterally.  Heel-to-shin is poor.  Gait and station: Station is normal.   Gait is wide but she can walk in the room without a walker.  With a walker she has a fairly good-sized stride and good speed.  She is unable to do tandem walk.  Romberg is mildly positive.  Reflexes: Deep tendon reflexes are 1+ in arms and absent in knees and ankles.        ASSESSMENT AND PLAN    1. Polyneuropathy   2. Restless leg   3. OSA on CPAP   4. End-stage renal disease on hemodialysis ([place])      1.   She will continue the medications for neuropathy and restless leg syndrome.  Prescriptions were sent in for refills 2.   She will continue CPAP +10cm .  She has benefited from CPAP for the treatment of her severe OSA (AHI = 44.7 in February 2015 PSG).   Download shows very good numbers.   3.   Physical therapy to help with ambulatory skills.  She would like to try to get from the walker to be more stable with a cane  4.   return in 6 months or sooner if there are new or worsening neurologic symptoms.   Levander Katzenstein A. Felecia Shelling, MD, PhD, Charlynn Grimes  11/20/1694, 78:93 AM Certified in Neurology, Clinical Neurophysiology, Sleep Medicine, Pain Medicine and Neuroimaging  [facility] [address] [place], [place] [address] [phone]

## 2022-05-06 ENCOUNTER — Telehealth: Payer: Self-pay | Admitting: Neurology

## 2022-05-06 NOTE — Telephone Encounter (Signed)
Called the patient back.  Advised the patient that I see he was currently set up and gets supplies through [employer].  Patient wants to transfer to the care but unfortunately to do that I have to for Medicare purposes have old office notes and sleep study.  Looks like she had a sleep study done according to the office notes on around February 2015.  Advised the patient I would need to have the office notes prior to her sleep study, sleep study, office note right after her sleep study. Other than that, I have everything else I would need to transfer her over to another company.  The patient is going to try to reach out to where she had her sleep study done to see if she can get this information for me.  In the meantime I will confirm with at the care that that is what they need, but from my understanding in order for Medicare to cover the supplies they would have to have this information.  If we are unable to access that information the only way I would be able to transfer her care would be repeating a home sleep test.  Patient will give me a call back if she has any questions or concerns.  Provided my fax number so that she can directly have her information faxed to me

## 2022-05-06 NOTE — Telephone Encounter (Signed)
Pt called [facility] to schedule appt for CPAP supplies. Informed did not have an order for CPAP supplies, and would need an order sent over. Would like a call from the nurse.

## 2022-05-15 ENCOUNTER — Telehealth: Payer: Self-pay | Admitting: Pulmonary Disease

## 2022-05-15 NOTE — Telephone Encounter (Signed)
Patient called to request a refill for her Incruse Ellipta inhaler.  She stated she is almost completely out and would like it sent to [address].  Please advise.

## 2022-05-16 MED ORDER — INCRUSE ELLIPTA 62.5 MCG/ACT IN AEPB: 1.0000 | INHALATION_SPRAY | Freq: Every day | RESPIRATORY_TRACT | 1 refills | Status: DC

## 2022-05-16 NOTE — Telephone Encounter (Signed)
I have refilled her Incruse  Spoke with her and she is aware rx was sent  Nothing further needed

## 2022-05-21 NOTE — Therapy (Signed)
OUTPATIENT PHYSICAL THERAPY NEURO EVALUATION   Patient Name: Whitney Chavez MRN: [id number] DOB:1956-06-20, 66 y.o., female Today's Date: 05/21/2022   PCP: Kathyrn Lass REFERRING PROVIDER: Arlice Colt    Past Medical History:  Diagnosis Date   Asthma    Axillary vein thrombosis ([place]) 08/16/2015   Calciphylaxis    Chronic kidney disease    Colitis    COPD (chronic obstructive pulmonary disease) ([place])    Factor V Leiden ([place])    Hypertension    Lymphedema    legs   MRSA (methicillin resistant staph aureus) culture positive    Neuropathy    Obstructive sleep apnea 03/29/2015   OSA on CPAP    Vision abnormalities    Past Surgical History:  Procedure Laterality Date   APPLICATION OF WOUND VAC     AV FISTULA PLACEMENT Left    ESOPHAGOGASTRODUODENOSCOPY (EGD) WITH PROPOFOL Bilateral 03/19/2022   Procedure: ESOPHAGOGASTRODUODENOSCOPY (EGD) WITH PROPOFOL;  Surgeon: Arta Silence, MD;  Location: [facility];  Service: Gastroenterology;  Laterality: Bilateral;   lumbar decompression fusion     TONSILLECTOMY     Patient Active Problem List   Diagnosis Date Noted   Eye redness 01/28/2021   Essential tremor 04/10/2020   Calciphylaxis 08/03/2018   Flatfoot 04/01/2018   Chronic pain syndrome 11/12/2017   Neuropathic pain 11/12/2017   COPD, moderate ([place]) 10/23/2017   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   Open abdominal wall wound 10/23/2017   Colitis 10/23/2017   Normocytic anemia 10/23/2017   Moderate major depression, single episode ([place]) 07/16/2017   Encounter for peritoneal dialysis catheter insertion ([place]) 06/18/2017   Panniculitis 04/28/2017   Long term current use of anticoagulant 01/17/2016   Factor V Leiden mutation ([place]) 01/17/2016   Axillary vein thrombosis ([place]) 08/16/2015   OSA on CPAP 03/29/2015   Factor 5 Leiden mutation, heterozygous ([place]) 03/29/2015   Insomnia 03/29/2015   Polyneuropathy 03/29/2015   Restless leg 03/29/2015   Gait disturbance 03/29/2015    Benign hypertension with end-stage renal disease ([place]) 03/01/2015   Asthma-COPD overlap syndrome ([place]) 08/19/2013   Morbid (severe) obesity due to excess calories ([place]) 08/19/2013   Morbid obesity with BMI of 45.0-49.9, adult ([place]) 08/19/2013   End-stage renal disease on hemodialysis ([place]) 08/18/2013    ONSET DATE: 04/22/22  REFERRING DIAG: G62.9, R26.9  THERAPY DIAG:  No diagnosis found.  Rationale for Evaluation and Treatment Rehabilitation  SUBJECTIVE:  SUBJECTIVE STATEMENT: My neuropathy and restless has been getting worse. I also have lymphedema in my legs and get dialysis 3x a week. I do lymphedema massages at home everyday but I should do it more often. I also have  Calciphylaxis so I have lumps all over me and it limits my range in some joints like my legs and arms.   Pt accompanied by: self  PERTINENT HISTORY: lumbar decompression fusion, COPD, CKD, neuropathy, dialysis   PAIN:  Are you having pain? No  PRECAUTIONS: Fall  WEIGHT BEARING RESTRICTIONS No  FALLS: Has patient fallen in last 6 months? No  LIVING ENVIRONMENT: Lives with: lives alone Lives in: House/apartment Stairs: No Has following equipment at home: Single point cane and Environmental consultant - 4 wheeled  PLOF: Independent  PATIENT GOALS walk better, balance better, strengthen my legs   OBJECTIVE:    COGNITION: Overall cognitive status: Within functional limits for tasks assessed   SENSATION: WFL   EDEMA:  Circumferential: patient has lymphedema in BLE and wears bandages   MUSCLE LENGTH: Hamstrings: tightness bilaterally, limited due to bandages and swelling from lymphedema   POSTURE: rounded shoulders, forward head, increased thoracic kyphosis, and flexed trunk   LOWER EXTREMITY ROM:   WFL, limited knee extension  bilaterally due to calciphylaxis and lymphedema    LOWER EXTREMITY MMT:    MMT Right Eval Left Eval  Hip flexion 4 4-  Hip extension    Hip abduction 4 4  Hip adduction 4 4  Hip internal rotation    Hip external rotation    Knee flexion 3 3  Knee extension 3+ 3  Ankle dorsiflexion 5 5  Ankle plantarflexion 5 5  Ankle inversion    Ankle eversion    (Blank rows = not tested)  TRANSFERS: Assistive device utilized: Environmental consultant - 4 wheeled  Sit to stand: Complete Independence Stand to sit: Modified independence Chair to chair: Modified independence   GAIT: Gait pattern: step to pattern, decreased step length- Right, decreased step length- Left, decreased stride length, decreased trunk rotation, trunk flexed, wide BOS, poor foot clearance- Right, and poor foot clearance- Left Distance walked: in clinic distances  Assistive device utilized: Environmental consultant - 4 wheeled Level of assistance: Complete Independence Comments: primarily using rollator to ambulate but uses SPC for short distances   FUNCTIONAL TESTs:  5 times sit to stand: 18.99s Timed up and go (TUG): 15.96s  TODAY'S TREATMENT:  Eval, POC, and HEP   PATIENT EDUCATION: Education details: POC and HEP Person educated: Patient Education method: Explanation Education comprehension: verbalized understanding   HOME EXERCISE PROGRAM: Access Code: [email or web address] URL: [email or web address] Date: 05/22/2022 Prepared by: Andris Baumann  Exercises - Seated Hip Abduction with Resistance  - 2 x daily - 7 x weekly - 2 sets - 10 reps - Seated Hip Adduction Isometrics with Ball  - 2 x daily - 7 x weekly - 2 sets - 10 reps - Standing March with Counter Support  - 2 x daily - 7 x weekly - 2 sets - 10 reps    GOALS: Goals reviewed with patient? Yes  SHORT TERM GOALS: Target date: 07/03/22  Patient will be independent with initial HEP. Goal status: INITIAL  2.  Patient will be educated on strategies to decrease risk of  falls.  Goal status: INITIAL   LONG TERM GOALS: Target date: 08/14/22  Patient will be independent with advanced/ongoing HEP to improve outcomes and carryover.  Goal status: INITIAL  2.  Patient will be able to ambulate  600' with LRAD with good safety to access community.  Baseline: using rollator Goal status: INITIAL  3.  Patient will be able to step up/down curb safely with LRAD for safety with community ambulation.  Baseline: unable to navigate curbs Goal status: INITIAL   4.  Patient will demonstrate improved functional LE strength as demonstrated by increase in 1 muscle grade in all weak groups. Goal status: INITIAL  5.  Patient will demonstrate decreased fall risk by scoring < 15 sec on 5xSTS. Baseline: 18.99s Goal status: INITIAL  6.  Patient will demonstrate decreased fall risk by scoring < 10 sec on TUG. Baseline: 15.96s Goal status: INITIAL  ASSESSMENT:  CLINICAL IMPRESSION: Patient is a 66 y.o. female who was seen today for physical therapy evaluation and treatment for gait and balance disturbances. She has an extensive PMH but remains motivated to get better in any way possible. She is using a rollator to ambulate but sometimes uses a cane short distances for example from her car to the grocery store. Patient would like to work on gait and balance and strength, to allow her to navigate the community faster and more steady. She will benefit from skilled PT to address gait and balance deficits and to build her endurance.     PERSONAL FACTORS Fitness and 3+ comorbidities: dialysis, calciphylaxis, polyneuropathy, lymphedema   are also affecting patient's functional outcome.   REHAB POTENTIAL: Good  CLINICAL DECISION MAKING: Stable/uncomplicated  EVALUATION COMPLEXITY: Low  PLAN: PT FREQUENCY: 2x/week  PT DURATION: 12 weeks  PLANNED INTERVENTIONS: Therapeutic exercises, Therapeutic activity, Neuromuscular re-education, Balance training, Gait training,  Patient/Family education, Self Care, Joint mobilization, Stair training, Cryotherapy, Moist heat, Manual lymph drainage, Compression bandaging, Ionotophoresis '4mg'$ /ml Dexamethasone, and Manual therapy  PLAN FOR NEXT SESSION: Nustep, LAQ, HS curls with band, gait training w/cane, step up 2" w/HHA, ball squeezes    [employer], PT 05/21/2022, 1:50 PM

## 2022-05-22 ENCOUNTER — Ambulatory Visit: Payer: Medicare Other | Attending: Neurology

## 2022-05-22 DIAGNOSIS — M6281 Muscle weakness (generalized): Secondary | ICD-10-CM | POA: Diagnosis present

## 2022-05-22 DIAGNOSIS — I890 Lymphedema, not elsewhere classified: Secondary | ICD-10-CM | POA: Insufficient documentation

## 2022-05-22 DIAGNOSIS — R279 Unspecified lack of coordination: Secondary | ICD-10-CM | POA: Insufficient documentation

## 2022-05-22 DIAGNOSIS — R269 Unspecified abnormalities of gait and mobility: Secondary | ICD-10-CM | POA: Diagnosis present

## 2022-05-22 DIAGNOSIS — G629 Polyneuropathy, unspecified: Secondary | ICD-10-CM | POA: Insufficient documentation

## 2022-05-22 DIAGNOSIS — R2689 Other abnormalities of gait and mobility: Secondary | ICD-10-CM | POA: Insufficient documentation

## 2022-06-03 ENCOUNTER — Ambulatory Visit: Payer: Medicare Other | Admitting: Physical Therapy

## 2022-06-03 ENCOUNTER — Encounter: Payer: Self-pay | Admitting: Physical Therapy

## 2022-06-03 DIAGNOSIS — R2689 Other abnormalities of gait and mobility: Secondary | ICD-10-CM | POA: Diagnosis not present

## 2022-06-03 DIAGNOSIS — R279 Unspecified lack of coordination: Secondary | ICD-10-CM

## 2022-06-03 DIAGNOSIS — M6281 Muscle weakness (generalized): Secondary | ICD-10-CM

## 2022-06-03 DIAGNOSIS — R269 Unspecified abnormalities of gait and mobility: Secondary | ICD-10-CM

## 2022-06-03 NOTE — Therapy (Signed)
OUTPATIENT PHYSICAL THERAPY NEURO TREATMENT   Patient Name: Whitney Chavez MRN: [id number] DOB:[birth year], 66 y.o., female Today's Date: 06/03/2022   PCP: Kathyrn Lass REFERRING PROVIDER: Arlice Colt   PT End of Session - 06/03/22 0853     Visit Number 2    Date for PT Re-Evaluation 08/14/22    Authorization Type Medicare    PT Start Time 0846    PT Stop Time 0927    PT Time Calculation (min) 41 min    Activity Tolerance Patient tolerated treatment well    Behavior During Therapy [facility] for tasks assessed/performed             Past Medical History:  Diagnosis Date   Asthma    Axillary vein thrombosis ([place]) 08/16/2015   Calciphylaxis    Chronic kidney disease    Colitis    COPD (chronic obstructive pulmonary disease) ([place])    Factor V Leiden ([place])    Hypertension    Lymphedema    legs   MRSA (methicillin resistant staph aureus) culture positive    Neuropathy    Obstructive sleep apnea 03/29/2015   OSA on CPAP    Vision abnormalities    Past Surgical History:  Procedure Laterality Date   APPLICATION OF WOUND VAC     AV FISTULA PLACEMENT Left    ESOPHAGOGASTRODUODENOSCOPY (EGD) WITH PROPOFOL Bilateral 03/19/2022   Procedure: ESOPHAGOGASTRODUODENOSCOPY (EGD) WITH PROPOFOL;  Surgeon: Arta Silence, MD;  Location: [facility];  Service: Gastroenterology;  Laterality: Bilateral;   lumbar decompression fusion     TONSILLECTOMY     Patient Active Problem List   Diagnosis Date Noted   Eye redness 01/28/2021   Essential tremor 04/10/2020   Calciphylaxis 08/03/2018   Flatfoot 04/01/2018   Chronic pain syndrome 11/12/2017   Neuropathic pain 11/12/2017   COPD, moderate ([place]) 10/23/2017   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   Open abdominal wall wound 10/23/2017   Colitis 10/23/2017   Normocytic anemia 10/23/2017   Moderate major depression, single episode ([place]) 07/16/2017   Encounter for peritoneal dialysis catheter insertion ([place]) 06/18/2017   Panniculitis 04/28/2017    Long term current use of anticoagulant 01/17/2016   Factor V Leiden mutation ([place]) 01/17/2016   Axillary vein thrombosis ([place]) 08/16/2015   OSA on CPAP 03/29/2015   Factor 5 Leiden mutation, heterozygous ([place]) 03/29/2015   Insomnia 03/29/2015   Polyneuropathy 03/29/2015   Restless leg 03/29/2015   Gait disturbance 03/29/2015   Benign hypertension with end-stage renal disease ([place]) 03/01/2015   Asthma-COPD overlap syndrome ([place]) 08/19/2013   Morbid (severe) obesity due to excess calories ([place]) 08/19/2013   Morbid obesity with BMI of 45.0-49.9, adult ([place]) 08/19/2013   End-stage renal disease on hemodialysis ([place]) 08/18/2013    ONSET DATE: 04/22/22  REFERRING DIAG: G62.9, R26.9  THERAPY DIAG:  Other abnormalities of gait and mobility  Gait disturbance  Unspecified lack of coordination  Muscle weakness (generalized)  Rationale for Evaluation and Treatment Rehabilitation  SUBJECTIVE:  SUBJECTIVE STATEMENT:  I do OK I can walk with a cane, I have a lot of problems with my health. I want to work on getting my legs stronger, I need to be able to walk a certain distance in a certain amount of time without a RW to be able to get a kidney transplant. I worked out 3 days a week 12 years ago before I lost my kidneys.   Pt accompanied by: self  PERTINENT HISTORY: lumbar decompression fusion, COPD, CKD, neuropathy, dialysis   PAIN:  Are you having pain? No  PRECAUTIONS: Fall  WEIGHT BEARING RESTRICTIONS No  FALLS: Has patient fallen in last 6 months? No  LIVING ENVIRONMENT: Lives with: lives alone Lives in: House/apartment Stairs: No Has following equipment at home: Single point cane and Environmental consultant - 4 wheeled  PLOF: Independent  PATIENT GOALS walk better, balance better, strengthen my legs    OBJECTIVE:    COGNITION: Overall cognitive status: Within functional limits for tasks assessed   SENSATION: WFL   EDEMA:  Circumferential: patient has lymphedema in BLE and wears bandages   MUSCLE LENGTH: Hamstrings: tightness bilaterally, limited due to bandages and swelling from lymphedema   POSTURE: rounded shoulders, forward head, increased thoracic kyphosis, and flexed trunk   LOWER EXTREMITY ROM:   WFL, limited knee extension bilaterally due to calciphylaxis and lymphedema    LOWER EXTREMITY MMT:    MMT Right Eval Left Eval  Hip flexion 4 4-  Hip extension    Hip abduction 4 4  Hip adduction 4 4  Hip internal rotation    Hip external rotation    Knee flexion 3 3  Knee extension 3+ 3  Ankle dorsiflexion 5 5  Ankle plantarflexion 5 5  Ankle inversion    Ankle eversion    (Blank rows = not tested)  TRANSFERS: Assistive device utilized: Environmental consultant - 4 wheeled  Sit to stand: Complete Independence Stand to sit: Modified independence Chair to chair: Modified independence   GAIT: Gait pattern: step to pattern, decreased step length- Right, decreased step length- Left, decreased stride length, decreased trunk rotation, trunk flexed, wide BOS, poor foot clearance- Right, and poor foot clearance- Left Distance walked: in clinic distances  Assistive device utilized: Environmental consultant - 4 wheeled Level of assistance: Complete Independence Comments: primarily using rollator to ambulate but uses SPC for short distances   FUNCTIONAL TESTs:  5 times sit to stand: 18.99s Timed up and go (TUG): 15.96s  TODAY'S TREATMENT:   06/03/22  TherEx  Nustep L3 x6 minutes BLEs only  Attempted LAQs with resistance unable to tolerate due to knee pain, LAQs 0# x10 B Seated  clams shells red TB x15  Supine bridges red TB around knees x10  Sidelying clams red TB 1x10 B STS 2x5 red TB above knees   Neuro Re-ed   Standing narrow BOS 3x30 seconds solid surface EC Semi-tandem stance  3x15 seconds B EO solid surface Semi-tandem stance 3 rounds with head turns horizontal plane       PATIENT EDUCATION: Education details: POC and HEP Person educated: Patient Education method: Explanation Education comprehension: verbalized understanding   HOME EXERCISE PROGRAM: Access Code: [email or web address] URL: [email or web address] Date: 05/22/2022 Prepared by: Andris Baumann  Exercises - Seated Hip Abduction with Resistance  - 2 x daily - 7 x weekly - 2 sets - 10 reps - Seated Hip Adduction Isometrics with Ball  - 2 x daily - 7 x weekly - 2 sets - 10 reps - Standing March with  Counter Support  - 2 x daily - 7 x weekly - 2 sets - 10 reps    GOALS: Goals reviewed with patient? Yes  SHORT TERM GOALS: Target date: 07/03/22  Patient will be independent with initial HEP. Goal status: INITIAL  2.  Patient will be educated on strategies to decrease risk of falls.  Goal status: INITIAL   LONG TERM GOALS: Target date: 08/14/22  Patient will be independent with advanced/ongoing HEP to improve outcomes and carryover.  Goal status: INITIAL  2.  Patient will be able to ambulate 600' with LRAD with good safety to access community.  Baseline: using rollator Goal status: INITIAL  3.  Patient will be able to step up/down curb safely with LRAD for safety with community ambulation.  Baseline: unable to navigate curbs Goal status: INITIAL   4.  Patient will demonstrate improved functional LE strength as demonstrated by increase in 1 muscle grade in all weak groups. Goal status: INITIAL  5.  Patient will demonstrate decreased fall risk by scoring < 15 sec on 5xSTS. Baseline: 18.99s Goal status: INITIAL  6.  Patient will demonstrate decreased fall risk by scoring < 10 sec on TUG. Baseline: 15.96s Goal status: INITIAL  ASSESSMENT:  CLINICAL IMPRESSION:               Shevaun arrives today very pleasant but very talkative, motivated to improve; did need intermittent cues  to remain on task. We focused on functional strength this morning per her request, has been compliant with HEP on non-HD days which is reasonable. We were unable to perform some resistance exercises today due to knee pain. Will continue to progress as able and tolerated.   PERSONAL FACTORS Fitness and 3+ comorbidities: dialysis, calciphylaxis, polyneuropathy, lymphedema   are also affecting patient's functional outcome.   REHAB POTENTIAL: Good  CLINICAL DECISION MAKING: Stable/uncomplicated  EVALUATION COMPLEXITY: Low  PLAN: PT FREQUENCY: 2x/week  PT DURATION: 12 weeks  PLANNED INTERVENTIONS: Therapeutic exercises, Therapeutic activity, Neuromuscular re-education, Balance training, Gait training, Patient/Family education, Self Care, Joint mobilization, Stair training, Cryotherapy, Moist heat, Manual lymph drainage, Compression bandaging, Ionotophoresis '4mg'$ /ml Dexamethasone, and Manual therapy  PLAN FOR NEXT SESSION: Nustep, LAQ, HS curls with band, gait training w/cane, step up 2" w/HHA, ball squeezes    Berkley Cronkright U PT DPT PN2  06/03/2022, 9:27 AM

## 2022-06-10 ENCOUNTER — Ambulatory Visit: Payer: Medicare Other | Admitting: Physical Therapy

## 2022-06-10 ENCOUNTER — Encounter: Payer: Self-pay | Admitting: Physical Therapy

## 2022-06-10 DIAGNOSIS — R2689 Other abnormalities of gait and mobility: Secondary | ICD-10-CM

## 2022-06-10 DIAGNOSIS — M6281 Muscle weakness (generalized): Secondary | ICD-10-CM

## 2022-06-10 DIAGNOSIS — R269 Unspecified abnormalities of gait and mobility: Secondary | ICD-10-CM

## 2022-06-10 DIAGNOSIS — R279 Unspecified lack of coordination: Secondary | ICD-10-CM

## 2022-06-10 NOTE — Therapy (Signed)
OUTPATIENT PHYSICAL THERAPY NEURO TREATMENT   Patient Name: Whitney Chavez MRN: [id number] DOB:December 04, 1955, 66 y.o., female Today's Date: 06/10/2022   PCP: Kathyrn Lass REFERRING PROVIDER: Arlice Colt   PT End of Session - 06/10/22 1017     Visit Number 3    Date for PT Re-Evaluation 08/14/22    PT Start Time 8333    PT Stop Time 1100    PT Time Calculation (min) 45 min    Activity Tolerance Patient tolerated treatment well    Behavior During Therapy [facility] for tasks assessed/performed             Past Medical History:  Diagnosis Date   Asthma    Axillary vein thrombosis ([place]) 08/16/2015   Calciphylaxis    Chronic kidney disease    Colitis    COPD (chronic obstructive pulmonary disease) ([place])    Factor V Leiden ([place])    Hypertension    Lymphedema    legs   MRSA (methicillin resistant staph aureus) culture positive    Neuropathy    Obstructive sleep apnea 03/29/2015   OSA on CPAP    Vision abnormalities    Past Surgical History:  Procedure Laterality Date   APPLICATION OF WOUND VAC     AV FISTULA PLACEMENT Left    ESOPHAGOGASTRODUODENOSCOPY (EGD) WITH PROPOFOL Bilateral 03/19/2022   Procedure: ESOPHAGOGASTRODUODENOSCOPY (EGD) WITH PROPOFOL;  Surgeon: Arta Silence, MD;  Location: [facility];  Service: Gastroenterology;  Laterality: Bilateral;   lumbar decompression fusion     TONSILLECTOMY     Patient Active Problem List   Diagnosis Date Noted   Eye redness 01/28/2021   Essential tremor 04/10/2020   Calciphylaxis 08/03/2018   Flatfoot 04/01/2018   Chronic pain syndrome 11/12/2017   Neuropathic pain 11/12/2017   COPD, moderate ([place]) 10/23/2017   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   Open abdominal wall wound 10/23/2017   Colitis 10/23/2017   Normocytic anemia 10/23/2017   Moderate major depression, single episode ([place]) 07/16/2017   Encounter for peritoneal dialysis catheter insertion ([employer]) 06/18/2017   Panniculitis 04/28/2017   Long term current use of  anticoagulant 01/17/2016   Factor V Leiden mutation ([place]) 01/17/2016   Axillary vein thrombosis ([place]) 08/16/2015   OSA on CPAP 03/29/2015   Factor 5 Leiden mutation, heterozygous ([place]) 03/29/2015   Insomnia 03/29/2015   Polyneuropathy 03/29/2015   Restless leg 03/29/2015   Gait disturbance 03/29/2015   Benign hypertension with end-stage renal disease ([place]) 03/01/2015   Asthma-COPD overlap syndrome ([place]) 08/19/2013   Morbid (severe) obesity due to excess calories ([place]) 08/19/2013   Morbid obesity with BMI of 45.0-49.9, adult ([place]) 08/19/2013   End-stage renal disease on hemodialysis ([place]) 08/18/2013    ONSET DATE: 04/22/22  REFERRING DIAG: G62.9, R26.9  THERAPY DIAG:  Other abnormalities of gait and mobility  Gait disturbance  Unspecified lack of coordination  Muscle weakness (generalized)  Rationale for Evaluation and Treatment Rehabilitation  SUBJECTIVE:  SUBJECTIVE STATEMENT:  "Good" Achy so that is a good sign when it hurts.  Pt accompanied by: self  PERTINENT HISTORY: lumbar decompression fusion, COPD, CKD, neuropathy, dialysis   PAIN:  Are you having pain? No  PRECAUTIONS: Fall  WEIGHT BEARING RESTRICTIONS No  FALLS: Has patient fallen in last 6 months? No  LIVING ENVIRONMENT: Lives with: lives alone Lives in: House/apartment Stairs: No Has following equipment at home: Single point cane and Environmental consultant - 4 wheeled  PLOF: Independent  PATIENT GOALS walk better, balance better, strengthen my legs   OBJECTIVE:    COGNITION: Overall cognitive status: Within functional limits for tasks assessed   SENSATION: WFL   EDEMA:  Circumferential: patient has lymphedema in BLE and wears bandages   MUSCLE LENGTH: Hamstrings: tightness bilaterally, limited due to bandages and  swelling from lymphedema   POSTURE: rounded shoulders, forward head, increased thoracic kyphosis, and flexed trunk   LOWER EXTREMITY ROM:   WFL, limited knee extension bilaterally due to calciphylaxis and lymphedema    LOWER EXTREMITY MMT:    MMT Right Eval Left Eval  Hip flexion 4 4-  Hip extension    Hip abduction 4 4  Hip adduction 4 4  Hip internal rotation    Hip external rotation    Knee flexion 3 3  Knee extension 3+ 3  Ankle dorsiflexion 5 5  Ankle plantarflexion 5 5  Ankle inversion    Ankle eversion    (Blank rows = not tested)  TRANSFERS: Assistive device utilized: Environmental consultant - 4 wheeled  Sit to stand: Complete Independence Stand to sit: Modified independence Chair to chair: Modified independence   GAIT: Gait pattern: step to pattern, decreased step length- Right, decreased step length- Left, decreased stride length, decreased trunk rotation, trunk flexed, wide BOS, poor foot clearance- Right, and poor foot clearance- Left Distance walked: in clinic distances  Assistive device utilized: Environmental consultant - 4 wheeled Level of assistance: Complete Independence Comments: primarily using rollator to ambulate but uses SPC for short distances   FUNCTIONAL TESTs:  5 times sit to stand: 18.99s Timed up and go (TUG): 15.96s  TODAY'S TREATMENT:  06/10/22 S2S x10 then one lap 167f x2  NuStep L4 x 5 min   LAQ 1lb 2x10 unable to complete with LLE to grinding pain  1lb seated march 2x10 Side step length of mat table x3 Gait w/ SPC 120 ft     06/03/22  TherEx  Nustep L3 x6 minutes BLEs only  Attempted LAQs with resistance unable to tolerate due to knee pain, LAQs 0# x10 B Seated  clams shells red TB x15  Supine bridges red TB around knees x10  Sidelying clams red TB 1x10 B STS 2x5 red TB above knees   Neuro Re-ed   Standing narrow BOS 3x30 seconds solid surface EC Semi-tandem stance 3x15 seconds B EO solid surface Semi-tandem stance 3 rounds with head turns  horizontal plane       PATIENT EDUCATION: Education details: POC and HEP Person educated: Patient Education method: Explanation Education comprehension: verbalized understanding   HOME EXERCISE PROGRAM: Access Code: [email or web address] URL: [email or web address] Date: 05/22/2022 Prepared by: MAndris Baumann Exercises - Seated Hip Abduction with Resistance  - 2 x daily - 7 x weekly - 2 sets - 10 reps - Seated Hip Adduction Isometrics with Ball  - 2 x daily - 7 x weekly - 2 sets - 10 reps - Standing March with Counter Support  - 2 x daily - 7 x weekly -  2 sets - 10 reps    GOALS: Goals reviewed with patient? Yes  SHORT TERM GOALS: Target date: 07/03/22  Patient will be independent with initial HEP. Goal status: progressing  2.  Patient will be educated on strategies to decrease risk of falls.  Goal status: INITIAL   LONG TERM GOALS: Target date: 08/14/22  Patient will be independent with advanced/ongoing HEP to improve outcomes and carryover.  Goal status: INITIAL  2.  Patient will be able to ambulate 600' with LRAD with good safety to access community.  Baseline: using rollator Goal status: INITIAL  3.  Patient will be able to step up/down curb safely with LRAD for safety with community ambulation.  Baseline: unable to navigate curbs Goal status: INITIAL   4.  Patient will demonstrate improved functional LE strength as demonstrated by increase in 1 muscle grade in all weak groups. Goal status: INITIAL  5.  Patient will demonstrate decreased fall risk by scoring < 15 sec on 5xSTS. Baseline: 18.99s Goal status: INITIAL  6.  Patient will demonstrate decreased fall risk by scoring < 10 sec on TUG. Baseline: 15.96s Goal status: INITIAL  ASSESSMENT:  CLINICAL IMPRESSION:               Mark arrives today very pleasant and motivated to improve session focused on functional strength and movement. Pt requested another print out of HEP. Unable to complete LAQ  with LLE due to increase pain. Side step were challenging for pt, cue required not to drag LE.   REHAB POTENTIAL: Good  CLINICAL DECISION MAKING: Stable/uncomplicated  EVALUATION COMPLEXITY: Low  PLAN: PT FREQUENCY: 2x/week  PT DURATION: 12 weeks  PLANNED INTERVENTIONS: Therapeutic exercises, Therapeutic activity, Neuromuscular re-education, Balance training, Gait training, Patient/Family education, Self Care, Joint mobilization, Stair training, Cryotherapy, Moist heat, Manual lymph drainage, Compression bandaging, Ionotophoresis '4mg'$ /ml Dexamethasone, and Manual therapy  PLAN FOR NEXT SESSION: Nustep, LAQ, HS curls with band, gait training w/cane, step up 2" w/HHA, ball squeezes    Cheri Fowler 06/10/2022, 10:18 AM

## 2022-06-12 ENCOUNTER — Ambulatory Visit: Payer: Medicare Other | Admitting: Physical Therapy

## 2022-06-12 ENCOUNTER — Encounter: Payer: Self-pay | Admitting: Physical Therapy

## 2022-06-12 DIAGNOSIS — R2689 Other abnormalities of gait and mobility: Secondary | ICD-10-CM

## 2022-06-12 DIAGNOSIS — R279 Unspecified lack of coordination: Secondary | ICD-10-CM

## 2022-06-12 DIAGNOSIS — M6281 Muscle weakness (generalized): Secondary | ICD-10-CM

## 2022-06-12 DIAGNOSIS — R269 Unspecified abnormalities of gait and mobility: Secondary | ICD-10-CM

## 2022-06-12 NOTE — Therapy (Signed)
OUTPATIENT PHYSICAL THERAPY NEURO TREATMENT   Patient Name: Whitney Chavez MRN: [id number] DOB:[birth year], 66 y.o., female Today's Date: 06/12/2022   PCP: Kathyrn Lass REFERRING PROVIDER: Arlice Colt   PT End of Session - 06/12/22 1025     Visit Number 4    Date for PT Re-Evaluation 08/14/22    Authorization Type Medicare    PT Start Time 1017    PT Stop Time 1058    PT Time Calculation (min) 41 min    Activity Tolerance Patient tolerated treatment well    Behavior During Therapy [facility] for tasks assessed/performed              Past Medical History:  Diagnosis Date   Asthma    Axillary vein thrombosis ([place]) 08/16/2015   Calciphylaxis    Chronic kidney disease    Colitis    COPD (chronic obstructive pulmonary disease) ([place])    Factor V Leiden ([place])    Hypertension    Lymphedema    legs   MRSA (methicillin resistant staph aureus) culture positive    Neuropathy    Obstructive sleep apnea 03/29/2015   OSA on CPAP    Vision abnormalities    Past Surgical History:  Procedure Laterality Date   APPLICATION OF WOUND VAC     AV FISTULA PLACEMENT Left    ESOPHAGOGASTRODUODENOSCOPY (EGD) WITH PROPOFOL Bilateral 03/19/2022   Procedure: ESOPHAGOGASTRODUODENOSCOPY (EGD) WITH PROPOFOL;  Surgeon: Arta Silence, MD;  Location: [facility];  Service: Gastroenterology;  Laterality: Bilateral;   lumbar decompression fusion     TONSILLECTOMY     Patient Active Problem List   Diagnosis Date Noted   Eye redness 01/28/2021   Essential tremor 04/10/2020   Calciphylaxis 08/03/2018   Flatfoot 04/01/2018   Chronic pain syndrome 11/12/2017   Neuropathic pain 11/12/2017   COPD, moderate ([place]) 10/23/2017   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   Open abdominal wall wound 10/23/2017   Colitis 10/23/2017   Normocytic anemia 10/23/2017   Moderate major depression, single episode ([place]) 07/16/2017   Encounter for peritoneal dialysis catheter insertion ([place]) 06/18/2017   Panniculitis 04/28/2017    Long term current use of anticoagulant 01/17/2016   Factor V Leiden mutation ([place]) 01/17/2016   Axillary vein thrombosis ([place]) 08/16/2015   OSA on CPAP 03/29/2015   Factor 5 Leiden mutation, heterozygous ([place]) 03/29/2015   Insomnia 03/29/2015   Polyneuropathy 03/29/2015   Restless leg 03/29/2015   Gait disturbance 03/29/2015   Benign hypertension with end-stage renal disease ([place]) 03/01/2015   Asthma-COPD overlap syndrome ([place]) 08/19/2013   Morbid (severe) obesity due to excess calories ([place]) 08/19/2013   Morbid obesity with BMI of 45.0-49.9, adult ([place]) 08/19/2013   End-stage renal disease on hemodialysis ([place]) 08/18/2013    ONSET DATE: 04/22/22  REFERRING DIAG: G62.9, R26.9  THERAPY DIAG:  Other abnormalities of gait and mobility  Gait disturbance  Unspecified lack of coordination  Muscle weakness (generalized)  Rationale for Evaluation and Treatment Rehabilitation  SUBJECTIVE:  SUBJECTIVE STATEMENT:  I'm doing OK just really sore from exercising today. Maybe the weather is making me more sore too. Can't do exercises on HD days too wiped out. Trying to get stronger, I still want to focus on my strength.   Pt accompanied by: self  PERTINENT HISTORY: lumbar decompression fusion, COPD, CKD, neuropathy, dialysis   PAIN:  Are you having pain? Yes: NPRS scale: 4/10 Pain location: both knees  Pain description: toothachey grinding pain Aggravating factors: bending knees trying straighten knees out all the way  Relieving factors: staying off of knees   PRECAUTIONS: Fall  WEIGHT BEARING RESTRICTIONS No  FALLS: Has patient fallen in last 6 months? No  LIVING ENVIRONMENT: Lives with: lives alone Lives in: House/apartment Stairs: No Has following equipment at home: Single point cane  and Environmental consultant - 4 wheeled  PLOF: Independent  PATIENT GOALS walk better, balance better, strengthen my legs   OBJECTIVE:    COGNITION: Overall cognitive status: Within functional limits for tasks assessed   SENSATION: WFL   EDEMA:  Circumferential: patient has lymphedema in BLE and wears bandages   MUSCLE LENGTH: Hamstrings: tightness bilaterally, limited due to bandages and swelling from lymphedema   POSTURE: rounded shoulders, forward head, increased thoracic kyphosis, and flexed trunk   LOWER EXTREMITY ROM:   WFL, limited knee extension bilaterally due to calciphylaxis and lymphedema    LOWER EXTREMITY MMT:    MMT Right Eval Left Eval  Hip flexion 4 4-  Hip extension    Hip abduction 4 4  Hip adduction 4 4  Hip internal rotation    Hip external rotation    Knee flexion 3 3  Knee extension 3+ 3  Ankle dorsiflexion 5 5  Ankle plantarflexion 5 5  Ankle inversion    Ankle eversion    (Blank rows = not tested)  TRANSFERS: Assistive device utilized: Environmental consultant - 4 wheeled  Sit to stand: Complete Independence Stand to sit: Modified independence Chair to chair: Modified independence   GAIT: Gait pattern: step to pattern, decreased step length- Right, decreased step length- Left, decreased stride length, decreased trunk rotation, trunk flexed, wide BOS, poor foot clearance- Right, and poor foot clearance- Left Distance walked: in clinic distances  Assistive device utilized: Environmental consultant - 4 wheeled Level of assistance: Complete Independence Comments: primarily using rollator to ambulate but uses SPC for short distances   FUNCTIONAL TESTs:  5 times sit to stand: 18.99s Timed up and go (TUG): 15.96s  TODAY'S TREATMENT:   06/12/22  TherEx:  Bridge with red TB and hip ABD x10 2 second holds Sidelying hip ABD x10 B red TB above knees  Attempted walking bridges- unable to tolerate, pain Attempted marching bridges- unable, weakness Gait about 150-125f with SBQC min  guard cues for safe use of device Attempted SAQs and quad sets, unable due to proprioception even with max cues  Side stepping alongside edge of mat table x4 BUE HHA  Square stepping x3 CW/CCW BUE HHA  Nustep L5 BUEs/BLEs x6 minutes >100spm    06/10/22 S2S x10 then one lap 122fx2  NuStep L4 x 5 min   LAQ 1lb 2x10 unable to complete with LLE to grinding pain  1lb seated march 2x10 Side step length of mat table x3 Gait w/ SPC 120 ft     06/03/22  TherEx  Nustep L3 x6 minutes BLEs only  Attempted LAQs with resistance unable to tolerate due to knee pain, LAQs 0# x10 B Seated  clams shells red TB  x15  Supine bridges red TB around knees x10  Sidelying clams red TB 1x10 B STS 2x5 red TB above knees   Neuro Re-ed   Standing narrow BOS 3x30 seconds solid surface EC Semi-tandem stance 3x15 seconds B EO solid surface Semi-tandem stance 3 rounds with head turns horizontal plane       PATIENT EDUCATION: Education details: POC and HEP Person educated: Patient Education method: Explanation Education comprehension: verbalized understanding   HOME EXERCISE PROGRAM: Access Code: [email or web address] URL: [email or web address] Date: 05/22/2022 Prepared by: Andris Baumann  Exercises - Seated Hip Abduction with Resistance  - 2 x daily - 7 x weekly - 2 sets - 10 reps - Seated Hip Adduction Isometrics with Ball  - 2 x daily - 7 x weekly - 2 sets - 10 reps - Standing March with Counter Support  - 2 x daily - 7 x weekly - 2 sets - 10 reps    GOALS: Goals reviewed with patient? Yes  SHORT TERM GOALS: Target date: 07/03/22  Patient will be independent with initial HEP. Goal status: progressing  2.  Patient will be educated on strategies to decrease risk of falls.  Goal status: INITIAL   LONG TERM GOALS: Target date: 08/14/22  Patient will be independent with advanced/ongoing HEP to improve outcomes and carryover.  Goal status: INITIAL  2.  Patient will be able to  ambulate 600' with LRAD with good safety to access community.  Baseline: using rollator Goal status: INITIAL  3.  Patient will be able to step up/down curb safely with LRAD for safety with community ambulation.  Baseline: unable to navigate curbs Goal status: INITIAL   4.  Patient will demonstrate improved functional LE strength as demonstrated by increase in 1 muscle grade in all weak groups. Goal status: INITIAL  5.  Patient will demonstrate decreased fall risk by scoring < 15 sec on 5xSTS. Baseline: 18.99s Goal status: INITIAL  6.  Patient will demonstrate decreased fall risk by scoring < 10 sec on TUG. Baseline: 15.96s Goal status: INITIAL  ASSESSMENT:  CLINICAL IMPRESSION:               Margo arrives doing OK, very sore from exercises but tells me she is definitely seeing functional improvement. Kept working on strength mixed with gait and machine intervals as able today. Some exercises limited by knee pain, modified PRN. Will continue to increase physical challenge as able and tolerated.   REHAB POTENTIAL: Good  CLINICAL DECISION MAKING: Stable/uncomplicated  EVALUATION COMPLEXITY: Low  PLAN: PT FREQUENCY: 2x/week  PT DURATION: 12 weeks  PLANNED INTERVENTIONS: Therapeutic exercises, Therapeutic activity, Neuromuscular re-education, Balance training, Gait training, Patient/Family education, Self Care, Joint mobilization, Stair training, Cryotherapy, Moist heat, Manual lymph drainage, Compression bandaging, Ionotophoresis '4mg'$ /ml Dexamethasone, and Manual therapy  PLAN FOR NEXT SESSION: Nustep, LAQ, HS curls with band, gait training w/cane, step up 2" w/HHA, ball squeezes    Eugenia Eldredge U PT DPT PN2  06/12/2022, 10:59 AM

## 2022-06-17 ENCOUNTER — Encounter: Payer: Self-pay | Admitting: Physical Therapy

## 2022-06-17 ENCOUNTER — Ambulatory Visit: Payer: Medicare Other | Attending: Neurology | Admitting: Physical Therapy

## 2022-06-17 DIAGNOSIS — R279 Unspecified lack of coordination: Secondary | ICD-10-CM | POA: Insufficient documentation

## 2022-06-17 DIAGNOSIS — G629 Polyneuropathy, unspecified: Secondary | ICD-10-CM | POA: Insufficient documentation

## 2022-06-17 DIAGNOSIS — M6281 Muscle weakness (generalized): Secondary | ICD-10-CM | POA: Insufficient documentation

## 2022-06-17 DIAGNOSIS — R293 Abnormal posture: Secondary | ICD-10-CM | POA: Insufficient documentation

## 2022-06-17 DIAGNOSIS — R2681 Unsteadiness on feet: Secondary | ICD-10-CM | POA: Diagnosis present

## 2022-06-17 DIAGNOSIS — R262 Difficulty in walking, not elsewhere classified: Secondary | ICD-10-CM | POA: Insufficient documentation

## 2022-06-17 DIAGNOSIS — R269 Unspecified abnormalities of gait and mobility: Secondary | ICD-10-CM | POA: Diagnosis present

## 2022-06-17 DIAGNOSIS — R278 Other lack of coordination: Secondary | ICD-10-CM | POA: Diagnosis present

## 2022-06-17 DIAGNOSIS — R2689 Other abnormalities of gait and mobility: Secondary | ICD-10-CM | POA: Diagnosis not present

## 2022-06-17 NOTE — Therapy (Signed)
OUTPATIENT PHYSICAL THERAPY NEURO TREATMENT   Patient Name: Whitney Chavez MRN: [id number] DOB:[birth year], 66 y.o., female Today's Date: 06/17/2022   PCP: Kathyrn Lass REFERRING PROVIDER: Arlice Colt   PT End of Session - 06/17/22 1017     Visit Number 5    Date for PT Re-Evaluation 08/14/22    Authorization Type Medicare    PT Start Time 4098    PT Stop Time 1100    PT Time Calculation (min) 45 min    Activity Tolerance Patient tolerated treatment well    Behavior During Therapy [facility] for tasks assessed/performed              Past Medical History:  Diagnosis Date   Asthma    Axillary vein thrombosis ([place]) 08/16/2015   Calciphylaxis    Chronic kidney disease    Colitis    COPD (chronic obstructive pulmonary disease) ([place])    Factor V Leiden ([place])    Hypertension    Lymphedema    legs   MRSA (methicillin resistant staph aureus) culture positive    Neuropathy    Obstructive sleep apnea 03/29/2015   OSA on CPAP    Vision abnormalities    Past Surgical History:  Procedure Laterality Date   APPLICATION OF WOUND VAC     AV FISTULA PLACEMENT Left    ESOPHAGOGASTRODUODENOSCOPY (EGD) WITH PROPOFOL Bilateral 03/19/2022   Procedure: ESOPHAGOGASTRODUODENOSCOPY (EGD) WITH PROPOFOL;  Surgeon: Arta Silence, MD;  [facility];  Service: Gastroenterology;  Laterality: Bilateral;   lumbar decompression fusion     TONSILLECTOMY     Patient Active Problem List   Diagnosis Date Noted   Eye redness 01/28/2021   Essential tremor 04/10/2020   Calciphylaxis 08/03/2018   Flatfoot 04/01/2018   Chronic pain syndrome 11/12/2017   Neuropathic pain 11/12/2017   COPD, moderate ([place]) 10/23/2017   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   Open abdominal wall wound 10/23/2017   Colitis 10/23/2017   Normocytic anemia 10/23/2017   Moderate major depression, single episode ([place]) 07/16/2017   Encounter for peritoneal dialysis catheter insertion ([place]) 06/18/2017   Panniculitis 04/28/2017    Long term current use of anticoagulant 01/17/2016   Factor V Leiden mutation ([place]) 01/17/2016   Axillary vein thrombosis ([place]) 08/16/2015   OSA on CPAP 03/29/2015   Factor 5 Leiden mutation, heterozygous ([place]) 03/29/2015   Insomnia 03/29/2015   Polyneuropathy 03/29/2015   Restless leg 03/29/2015   Gait disturbance 03/29/2015   Benign hypertension with end-stage renal disease ([place]) 03/01/2015   Asthma-COPD overlap syndrome 08/19/2013   Morbid (severe) obesity due to excess calories ([place]) 08/19/2013   Morbid obesity with BMI of 45.0-49.9, adult ([place]) 08/19/2013   End-stage renal disease on hemodialysis ([place]) 08/18/2013    ONSET DATE: 04/22/22  REFERRING DIAG: G62.9, R26.9  THERAPY DIAG:  Other abnormalities of gait and mobility  Gait disturbance  Unspecified lack of coordination  Muscle weakness (generalized)  Rationale for Evaluation and Treatment Rehabilitation  SUBJECTIVE:  SUBJECTIVE STATEMENT: I'm doing OK, does have come knee pain after treatment.   Pt accompanied by: self  PERTINENT HISTORY: lumbar decompression fusion, COPD, CKD, neuropathy, dialysis   PAIN:  Are you having pain? Yes: NPRS scale: 4/10 Pain location: both knees  Pain description: toothachey grinding pain Aggravating factors: bending knees trying straighten knees out all the way  Relieving factors: staying off of knees   PRECAUTIONS: Fall  WEIGHT BEARING RESTRICTIONS No  FALLS: Has patient fallen in last 6 months? No  LIVING ENVIRONMENT: Lives with: lives alone Lives in: House/apartment Stairs: No Has following equipment at home: Single point cane and Environmental consultant - 4 wheeled  PLOF: Independent  PATIENT GOALS walk better, balance better, strengthen my legs   OBJECTIVE:    COGNITION: Overall  cognitive status: Within functional limits for tasks assessed   SENSATION: WFL   EDEMA:  Circumferential: patient has lymphedema in BLE and wears bandages   MUSCLE LENGTH: Hamstrings: tightness bilaterally, limited due to bandages and swelling from lymphedema   POSTURE: rounded shoulders, forward head, increased thoracic kyphosis, and flexed trunk   LOWER EXTREMITY ROM:   WFL, limited knee extension bilaterally due to calciphylaxis and lymphedema    LOWER EXTREMITY MMT:    MMT Right Eval Left Eval  Hip flexion 4 4-  Hip extension    Hip abduction 4 4  Hip adduction 4 4  Hip internal rotation    Hip external rotation    Knee flexion 3 3  Knee extension 3+ 3  Ankle dorsiflexion 5 5  Ankle plantarflexion 5 5  Ankle inversion    Ankle eversion    (Blank rows = not tested)  TRANSFERS: Assistive device utilized: Environmental consultant - 4 wheeled  Sit to stand: Complete Independence Stand to sit: Modified independence Chair to chair: Modified independence   GAIT: Gait pattern: step to pattern, decreased step length- Right, decreased step length- Left, decreased stride length, decreased trunk rotation, trunk flexed, wide BOS, poor foot clearance- Right, and poor foot clearance- Left Distance walked: in clinic distances  Assistive device utilized: Environmental consultant - 4 wheeled Level of assistance: Complete Independence Comments: primarily using rollator to ambulate but uses SPC for short distances   FUNCTIONAL TESTs:  5 times sit to stand: 18.99s Timed up and go (TUG): 15.96s  TODAY'S TREATMENT:   06/17/22 Gait rollator 136f, Gait with SPC 1214fx2 seated rest after each trial NuStep L3 x 5 min LE only LAQ 3x5 each Limited ROM w/ LLE  S2S 2x10 one set with UE one set without  Standing ball toss  Standing ball kicks  Gait No AD 2039fpt looks down Hamstring curls green 2x12 Gait no AD 26 feet   06/12/22  TherEx:  Bridge with red TB and hip ABD x10 2 second holds Sidelying hip ABD  x10 B red TB above knees  Attempted walking bridges- unable to tolerate, pain Attempted marching bridges- unable, weakness Gait about 150-160f53fth SBQC min guard cues for safe use of device Attempted SAQs and quad sets, unable due to proprioception even with max cues  Side stepping alongside edge of mat table x4 BUE HHA  Square stepping x3 CW/CCW BUE HHA  Nustep L5 BUEs/BLEs x6 minutes >100spm    06/10/22 S2S x10 then one lap 120ft58f NuStep L4 x 5 min   LAQ 1lb 2x10 unable to complete with LLE to grinding pain  1lb seated march 2x10 Side step length of mat table x3 Gait w/ SPC 120 ft  06/03/22  TherEx  Nustep L3 x6 minutes BLEs only  Attempted LAQs with resistance unable to tolerate due to knee pain, LAQs 0# x10 B Seated  clams shells red TB x15  Supine bridges red TB around knees x10  Sidelying clams red TB 1x10 B STS 2x5 red TB above knees   Neuro Re-ed   Standing narrow BOS 3x30 seconds solid surface EC Semi-tandem stance 3x15 seconds B EO solid surface Semi-tandem stance 3 rounds with head turns horizontal plane       PATIENT EDUCATION: Education details: POC and HEP Person educated: Patient Education method: Explanation Education comprehension: verbalized understanding   HOME EXERCISE PROGRAM: Access Code: [email or web address] URL: [email or web address] Date: 05/22/2022 Prepared by: Andris Baumann  Exercises - Seated Hip Abduction with Resistance  - 2 x daily - 7 x weekly - 2 sets - 10 reps - Seated Hip Adduction Isometrics with Ball  - 2 x daily - 7 x weekly - 2 sets - 10 reps - Standing March with Counter Support  - 2 x daily - 7 x weekly - 2 sets - 10 reps    GOALS: Goals reviewed with patient? Yes  SHORT TERM GOALS: Target date: 07/03/22  Patient will be independent with initial HEP. Goal status: progressing  2.  Patient will be educated on strategies to decrease risk of falls.  Goal status: met   LONG TERM GOALS: Target date:  08/14/22  Patient will be independent with advanced/ongoing HEP to improve outcomes and carryover.  Goal status: INITIAL  2.  Patient will be able to ambulate 600' with LRAD with good safety to access community.  Baseline: using rollator Goal status: INITIAL  3.  Patient will be able to step up/down curb safely with LRAD for safety with community ambulation.  Baseline: unable to navigate curbs Goal status: INITIAL   4.  Patient will demonstrate improved functional LE strength as demonstrated by increase in 1 muscle grade in all weak groups. Goal status: INITIAL  5.  Patient will demonstrate decreased fall risk by scoring < 15 sec on 5xSTS. Baseline: 18.99s Goal status: INITIAL  6.  Patient will demonstrate decreased fall risk by scoring < 10 sec on TUG. Baseline: 15.96s Goal status: INITIAL  ASSESSMENT:  CLINICAL IMPRESSION:               Senie arrives doing OK, She did well during today's session. She has gained some confidence ambulating without AD with SBA. Limited ROM with RLE LAQ . Pt able to complete sit to stands without UE.     REHAB POTENTIAL: Good  CLINICAL DECISION MAKING: Stable/uncomplicated  EVALUATION COMPLEXITY: Low  PLAN: PT FREQUENCY: 2x/week  PT DURATION: 12 weeks  PLANNED INTERVENTIONS: Therapeutic exercises, Therapeutic activity, Neuromuscular re-education, Balance training, Gait training, Patient/Family education, Self Care, Joint mobilization, Stair training, Cryotherapy, Moist heat, Manual lymph drainage, Compression bandaging, Ionotophoresis 6m/ml Dexamethasone, and Manual therapy  PLAN FOR NEXT SESSION: Nustep, LAQ, HS curls with band, gait training w/cane, step up 2" w/HHA, ball squeezes    RCheri FowlerPTA 06/17/2022, 10:18 AM

## 2022-06-19 ENCOUNTER — Ambulatory Visit: Payer: Medicare Other | Admitting: Physical Therapy

## 2022-06-19 ENCOUNTER — Encounter: Payer: Self-pay | Admitting: Physical Therapy

## 2022-06-19 DIAGNOSIS — R269 Unspecified abnormalities of gait and mobility: Secondary | ICD-10-CM

## 2022-06-19 DIAGNOSIS — R2689 Other abnormalities of gait and mobility: Secondary | ICD-10-CM

## 2022-06-19 DIAGNOSIS — M6281 Muscle weakness (generalized): Secondary | ICD-10-CM

## 2022-06-19 NOTE — Therapy (Signed)
OUTPATIENT PHYSICAL THERAPY NEURO TREATMENT   Patient Name: Whitney Chavez MRN: [id number] DOB:12-02-1955, 66 y.o., female Today's Date: 06/19/2022   PCP: Kathyrn Lass REFERRING PROVIDER: Arlice Colt   PT End of Session - 06/19/22 1016     Visit Number 6    Date for PT Re-Evaluation 08/14/22    PT Start Time 2683    PT Stop Time 1100    PT Time Calculation (min) 45 min    Activity Tolerance Patient tolerated treatment well    Behavior During Therapy [facility] for tasks assessed/performed              Past Medical History:  Diagnosis Date   Asthma    Axillary vein thrombosis ([place]) 08/16/2015   Calciphylaxis    Chronic kidney disease    Colitis    COPD (chronic obstructive pulmonary disease) ([place])    Factor V Leiden ([place])    Hypertension    Lymphedema    legs   MRSA (methicillin resistant staph aureus) culture positive    Neuropathy    Obstructive sleep apnea 03/29/2015   OSA on CPAP    Vision abnormalities    Past Surgical History:  Procedure Laterality Date   APPLICATION OF WOUND VAC     AV FISTULA PLACEMENT Left    ESOPHAGOGASTRODUODENOSCOPY (EGD) WITH PROPOFOL Bilateral 03/19/2022   Procedure: ESOPHAGOGASTRODUODENOSCOPY (EGD) WITH PROPOFOL;  Surgeon: Arta Silence, MD;  Location: [facility];  Service: Gastroenterology;  Laterality: Bilateral;   lumbar decompression fusion     TONSILLECTOMY     Patient Active Problem List   Diagnosis Date Noted   Eye redness 01/28/2021   Essential tremor 04/10/2020   Calciphylaxis 08/03/2018   Flatfoot 04/01/2018   Chronic pain syndrome 11/12/2017   Neuropathic pain 11/12/2017   COPD, moderate ([place]) 10/23/2017   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   Open abdominal wall wound 10/23/2017   Colitis 10/23/2017   Normocytic anemia 10/23/2017   Moderate major depression, single episode ([place]) 07/16/2017   Encounter for peritoneal dialysis catheter insertion ([place]) 06/18/2017   Panniculitis 04/28/2017   Long term current use of  anticoagulant 01/17/2016   Factor V Leiden mutation ([place]) 01/17/2016   Axillary vein thrombosis ([place]) 08/16/2015   OSA on CPAP 03/29/2015   Factor 5 Leiden mutation, heterozygous ([place]) 03/29/2015   Insomnia 03/29/2015   Polyneuropathy 03/29/2015   Restless leg 03/29/2015   Gait disturbance 03/29/2015   Benign hypertension with end-stage renal disease ([place]) 03/01/2015   Asthma-COPD overlap syndrome 08/19/2013   Morbid (severe) obesity due to excess calories ([place]) 08/19/2013   Morbid obesity with BMI of 45.0-49.9, adult ([place]) 08/19/2013   End-stage renal disease on hemodialysis ([place]) 08/18/2013    ONSET DATE: 04/22/22  REFERRING DIAG: G62.9, R26.9  THERAPY DIAG:  Other abnormalities of gait and mobility  Gait disturbance  Muscle weakness (generalized)  Rationale for Evaluation and Treatment Rehabilitation  SUBJECTIVE:  SUBJECTIVE STATEMENT: "Today I feel pretty good"   Pt accompanied by: self  PERTINENT HISTORY: lumbar decompression fusion, COPD, CKD, neuropathy, dialysis   PAIN:  Are you having pain? Yes: NPRS scale: 2/10 Pain location: both knees  Pain description: toothachey grinding pain Aggravating factors: bending knees trying straighten knees out all the way  Relieving factors: staying off of knees   PRECAUTIONS: Fall  WEIGHT BEARING RESTRICTIONS No  FALLS: Has patient fallen in last 6 months? No  LIVING ENVIRONMENT: Lives with: lives alone Lives in: House/apartment Stairs: No Has following equipment at home: Single point cane and Environmental consultant - 4 wheeled  PLOF: Independent  PATIENT GOALS walk better, balance better, strengthen my legs   OBJECTIVE:    COGNITION: Overall cognitive status: Within functional limits for tasks assessed   SENSATION: WFL   EDEMA:   Circumferential: patient has lymphedema in BLE and wears bandages   MUSCLE LENGTH: Hamstrings: tightness bilaterally, limited due to bandages and swelling from lymphedema   POSTURE: rounded shoulders, forward head, increased thoracic kyphosis, and flexed trunk   LOWER EXTREMITY ROM:   WFL, limited knee extension bilaterally due to calciphylaxis and lymphedema    LOWER EXTREMITY MMT:    MMT Right Eval Left Eval Right  06/19/22 Left 06/19/22  Hip flexion 4 4- 4+ 4  Hip extension      Hip abduction _0 Hip adduction _1 Hip internal rotation      Hip external rotation      Knee flexion _2 4+  Knee extension 3+ _3 Ankle dorsiflexion 5 5    Ankle plantarflexion 5 5    Ankle inversion      Ankle eversion      (Blank rows = not tested)  TRANSFERS: Assistive device utilized: Environmental consultant - 4 wheeled  Sit to stand: Complete Independence Stand to sit: Modified independence Chair to chair: Modified independence   GAIT: Gait pattern: step to pattern, decreased step length- Right, decreased step length- Left, decreased stride length, decreased trunk rotation, trunk flexed, wide BOS, poor foot clearance- Right, and poor foot clearance- Left Distance walked: in clinic distances  Assistive device utilized: Environmental consultant - 4 wheeled Level of assistance: Complete Independence Comments: primarily using rollator to ambulate but uses SPC for short distances   FUNCTIONAL TESTs:  5 times sit to stand: 18.99s Timed up and go (TUG): 15.96s  TODAY'S TREATMENT:  06/19/22 Gait 26 ft then 36f No AD, pt looks down due to her neuropathy and not feeling feet, cues to increase step length with RLE   NuStep L5 x 6 min Ball kicks  Standing ball too Standing ball toss on airex Sit to stand on airex  LAQ x10 each    06/17/22 Gait rollator 127f Gait with SPC [id number] seated rest after each trial NuStep L3 x 5 min LE only LAQ 3x5 each Limited ROM w/ LLE  S2S 2x10 one set with UE one  set without  Standing ball toss  Standing ball kicks  Gait No AD 57f21ft looks down Hamstring curls green 2x12 Gait no AD 26 feet   06/12/22  TherEx:  Bridge with red TB and hip ABD x10 2 second holds Sidelying hip ABD x10 B red TB above knees  Attempted walking bridges- unable to tolerate, pain Attempted marching bridges- unable, weakness Gait about 150-160ft49fh SBQC min guard cues for safe use of device Attempted SAQs and quad sets, unable  due to proprioception even with max cues  Side stepping alongside edge of mat table x4 BUE HHA  Square stepping x3 CW/CCW BUE HHA  Nustep L5 BUEs/BLEs x6 minutes >100spm    06/10/22 S2S x10 then one lap 162f x2  NuStep L4 x 5 min   LAQ 1lb 2x10 unable to complete with LLE to grinding pain  1lb seated march 2x10 Side step length of mat table x3 Gait w/ SPC 120 ft     06/03/22  TherEx  Nustep L3 x6 minutes BLEs only  Attempted LAQs with resistance unable to tolerate due to knee pain, LAQs 0# x10 B Seated  clams shells red TB x15  Supine bridges red TB around knees x10  Sidelying clams red TB 1x10 B STS 2x5 red TB above knees   Neuro Re-ed   Standing narrow BOS 3x30 seconds solid surface EC Semi-tandem stance 3x15 seconds B EO solid surface Semi-tandem stance 3 rounds with head turns horizontal plane       PATIENT EDUCATION: Education details: POC and HEP Person educated: Patient Education method: Explanation Education comprehension: verbalized understanding   HOME EXERCISE PROGRAM: Access Code: [email or web address] URL: [email or web address] Date: 05/22/2022 Prepared by: MAndris Baumann Exercises - Seated Hip Abduction with Resistance  - 2 x daily - 7 x weekly - 2 sets - 10 reps - Seated Hip Adduction Isometrics with Ball  - 2 x daily - 7 x weekly - 2 sets - 10 reps - Standing March with Counter Support  - 2 x daily - 7 x weekly - 2 sets - 10 reps    GOALS: Goals reviewed with patient? Yes  SHORT TERM  GOALS: Target date: 07/03/22  Patient will be independent with initial HEP. Goal status: progressing  2.  Patient will be educated on strategies to decrease risk of falls.  Goal status: met   LONG TERM GOALS: Target date: 08/14/22  Patient will be independent with advanced/ongoing HEP to improve outcomes and carryover.  Goal status: INITIAL  2.  Patient will be able to ambulate 600' with LRAD with good safety to access community.  Baseline: using rollator Goal status: INITIAL  3.  Patient will be able to step up/down curb safely with LRAD for safety with community ambulation.  Baseline: unable to navigate curbs Goal status: INITIAL   4.  Patient will demonstrate improved functional LE strength as demonstrated by increase in 1 muscle grade in all weak groups. Goal status: Partly Met 10/5/23n  5.  Patient will demonstrate decreased fall risk by scoring < 15 sec on 5xSTS. Baseline: 18.99s Goal status: INITIAL  6.  Patient will demonstrate decreased fall risk by scoring < 10 sec on TUG. Baseline: 15.96s Goal status: Met 10.06 06/19/22  ASSESSMENT:  CLINICAL IMPRESSION:               LEnjoliarrives doing OK, She did well during today's session. She has gained some confidence ambulating without AD with SBA increasing her distance today. Limited ROM with RLE LAQ . Pt able to complete sit to stands without UE on airex.  LOB x1 with ball toss on airex, pt demo ed stepping righting reaction but required Min to MOD assist to recover.  She has progressed meeting some LTG's   REHAB POTENTIAL: Good  CLINICAL DECISION MAKING: Stable/uncomplicated  EVALUATION COMPLEXITY: Low  PLAN: PT FREQUENCY: 2x/week  PT DURATION: 12 weeks  PLANNED INTERVENTIONS: Therapeutic exercises, Therapeutic activity, Neuromuscular re-education, Balance training, Gait training, Patient/Family education, Self Care, Joint  mobilization, Stair training, Cryotherapy, Moist heat, Manual lymph drainage, Compression  bandaging, Ionotophoresis 49m/ml Dexamethasone, and Manual therapy  PLAN FOR NEXT SESSION: Nustep, LAQ, HS curls with band, gait training w/cane, step up 2" w/HHA, ball squeezes    RCheri FowlerPTA 06/19/2022, 10:17 AM

## 2022-06-26 ENCOUNTER — Encounter: Payer: Self-pay | Admitting: Physical Therapy

## 2022-06-26 ENCOUNTER — Ambulatory Visit: Payer: Medicare Other | Admitting: Physical Therapy

## 2022-06-26 DIAGNOSIS — R269 Unspecified abnormalities of gait and mobility: Secondary | ICD-10-CM

## 2022-06-26 DIAGNOSIS — R2689 Other abnormalities of gait and mobility: Secondary | ICD-10-CM | POA: Diagnosis not present

## 2022-06-26 DIAGNOSIS — R279 Unspecified lack of coordination: Secondary | ICD-10-CM

## 2022-06-26 DIAGNOSIS — M6281 Muscle weakness (generalized): Secondary | ICD-10-CM

## 2022-06-26 NOTE — Therapy (Signed)
OUTPATIENT PHYSICAL THERAPY NEURO TREATMENT   Patient Name: Whitney Chavez MRN: [id number] DOB:Feb 16, 1956, 66 y.o., female Today's Date: 06/26/2022   PCP: Kathyrn Lass REFERRING PROVIDER: Arlice Colt   PT End of Session - 06/26/22 1053     Visit Number 7    Date for PT Re-Evaluation 08/14/22    Authorization Type Medicare    PT Start Time 1055    PT Stop Time 1146    PT Time Calculation (min) 51 min    Activity Tolerance Patient tolerated treatment well    Behavior During Therapy [facility] for tasks assessed/performed              Past Medical History:  Diagnosis Date   Asthma    Axillary vein thrombosis ([place]) 08/16/2015   Calciphylaxis    Chronic kidney disease    Colitis    COPD (chronic obstructive pulmonary disease) ([place])    Factor V Leiden ([place])    Hypertension    Lymphedema    legs   MRSA (methicillin resistant staph aureus) culture positive    Neuropathy    Obstructive sleep apnea 03/29/2015   OSA on CPAP    Vision abnormalities    Past Surgical History:  Procedure Laterality Date   APPLICATION OF WOUND VAC     AV FISTULA PLACEMENT Left    ESOPHAGOGASTRODUODENOSCOPY (EGD) WITH PROPOFOL Bilateral 03/19/2022   Procedure: ESOPHAGOGASTRODUODENOSCOPY (EGD) WITH PROPOFOL;  Surgeon: Arta Silence, MD;  [facility];  Service: Gastroenterology;  Laterality: Bilateral;   lumbar decompression fusion     TONSILLECTOMY     Patient Active Problem List   Diagnosis Date Noted   Eye redness 01/28/2021   Essential tremor 04/10/2020   Calciphylaxis 08/03/2018   Flatfoot 04/01/2018   Chronic pain syndrome 11/12/2017   Neuropathic pain 11/12/2017   COPD, moderate ([place]) 10/23/2017   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   Open abdominal wall wound 10/23/2017   Colitis 10/23/2017   Normocytic anemia 10/23/2017   Moderate major depression, single episode ([place]) 07/16/2017   Encounter for peritoneal dialysis catheter insertion ([place]) 06/18/2017   Panniculitis 04/28/2017    Long term current use of anticoagulant 01/17/2016   Factor V Leiden mutation ([place]) 01/17/2016   Axillary vein thrombosis ([place]) 08/16/2015   OSA on CPAP 03/29/2015   Factor 5 Leiden mutation, heterozygous ([place]) 03/29/2015   Insomnia 03/29/2015   Polyneuropathy 03/29/2015   Restless leg 03/29/2015   Gait disturbance 03/29/2015   Benign hypertension with end-stage renal disease ([place]) 03/01/2015   Asthma-COPD overlap syndrome 08/19/2013   Morbid (severe) obesity due to excess calories ([place]) 08/19/2013   Morbid obesity with BMI of 45.0-49.9, adult ([place]) 08/19/2013   End-stage renal disease on hemodialysis ([place]) 08/18/2013    ONSET DATE: 04/22/22  REFERRING DIAG: G62.9, R26.9  THERAPY DIAG:  Other abnormalities of gait and mobility  Gait disturbance  Muscle weakness (generalized)  Unspecified lack of coordination  Rationale for Evaluation and Treatment Rehabilitation  SUBJECTIVE:  SUBJECTIVE STATEMENT: I got the needs for surgery I want to work on that   Pt accompanied by: self  PERTINENT HISTORY: lumbar decompression fusion, COPD, CKD, neuropathy, dialysis   PAIN:  Are you having pain? Yes: NPRS scale: 2/10 Pain location: both knees  Pain description: toothachey grinding pain Aggravating factors: bending knees trying straighten knees out all the way  Relieving factors: staying off of knees   PRECAUTIONS: Fall  WEIGHT BEARING RESTRICTIONS No  FALLS: Has patient fallen in last 6 months? No  LIVING ENVIRONMENT: Lives with: lives alone Lives in: House/apartment Stairs: No Has following equipment at home: Single point cane and Environmental consultant - 4 wheeled  PLOF: Independent  PATIENT GOALS walk better, balance better, strengthen my legs   OBJECTIVE:    COGNITION: Overall cognitive  status: Within functional limits for tasks assessed   SENSATION: WFL   EDEMA:  Circumferential: patient has lymphedema in BLE and wears bandages   MUSCLE LENGTH: Hamstrings: tightness bilaterally, limited due to bandages and swelling from lymphedema   POSTURE: rounded shoulders, forward head, increased thoracic kyphosis, and flexed trunk   LOWER EXTREMITY ROM:   WFL, limited knee extension bilaterally due to calciphylaxis and lymphedema    LOWER EXTREMITY MMT:    MMT Right Eval Left Eval Right  06/19/22 Left 06/19/22  Hip flexion 4 4- 4+ 4  Hip extension      Hip abduction '4 4 5 5  ' Hip adduction '4 4 5 5  ' Hip internal rotation      Hip external rotation      Knee flexion '3 3 5 ' 4+  Knee extension 3+ '3 4 3  ' Ankle dorsiflexion 5 5    Ankle plantarflexion 5 5    Ankle inversion      Ankle eversion      (Blank rows = not tested)  TRANSFERS: Assistive device utilized: Environmental consultant - 4 wheeled  Sit to stand: Complete Independence Stand to sit: Modified independence Chair to chair: Modified independence   GAIT: Gait pattern: step to pattern, decreased step length- Right, decreased step length- Left, decreased stride length, decreased trunk rotation, trunk flexed, wide BOS, poor foot clearance- Right, and poor foot clearance- Left Distance walked: in clinic distances  Assistive device utilized: Environmental consultant - 4 wheeled Level of assistance: Complete Independence Comments: primarily using rollator to ambulate but uses SPC for short distances   FUNCTIONAL TESTs:  5 times sit to stand: 18 seconds on 06/26/22 Timed up and go (TUG): 15.96s  TODAY'S TREATMENT:  06/26/22 Right Grip strength 33# BMI 38.1 Rolling start 15 foot walk test 7 seconds without device STS 3x5 5# straight arm pulls 2x10 Feet on ball K2C, trunk rotation, small bridges, isometric abs Gait with rollator out the back door and around to the front of th building  06/19/22 Gait 26 ft then 45f No AD, pt looks down  due to her neuropathy and not feeling feet, cues to increase step length with RLE   NuStep L5 x 6 min Ball kicks  Standing ball too Standing ball toss on airex Sit to stand on airex  LAQ x10 each    06/17/22 Gait rollator 1288f Gait with SPC 12068f2 seated rest after each trial NuStep L3 x 5 min LE only LAQ 3x5 each Limited ROM w/ LLE  S2S 2x10 one set with UE one set without  Standing ball toss  Standing ball kicks  Gait No AD 71f70ft looks down Hamstring curls green 2x12 Gait no AD  26 feet   06/12/22  TherEx:  Bridge with red TB and hip ABD x10 2 second holds Sidelying hip ABD x10 B red TB above knees  Attempted walking bridges- unable to tolerate, pain Attempted marching bridges- unable, weakness Gait about 150-142f with SBQC min guard cues for safe use of device Attempted SAQs and quad sets, unable due to proprioception even with max cues  Side stepping alongside edge of mat table x4 BUE HHA  Square stepping x3 CW/CCW BUE HHA  Nustep L5 BUEs/BLEs x6 minutes >100spm    06/10/22 S2S x10 then one lap [id number]  NuStep L4 x 5 min   LAQ 1lb 2x10 unable to complete with LLE to grinding pain  1lb seated march 2x10 Side step length of mat table x3 Gait w/ SPC 120 ft     06/03/22  TherEx  Nustep L3 x6 minutes BLEs only  Attempted LAQs with resistance unable to tolerate due to knee pain, LAQs 0# x10 B Seated  clams shells red TB x15  Supine bridges red TB around knees x10  Sidelying clams red TB 1x10 B STS 2x5 red TB above knees   Neuro Re-ed   Standing narrow BOS 3x30 seconds solid surface EC Semi-tandem stance 3x15 seconds B EO solid surface Semi-tandem stance 3 rounds with head turns horizontal plane       PATIENT EDUCATION: Education details: POC and HEP Person educated: Patient Education method: Explanation Education comprehension: verbalized understanding   HOME EXERCISE PROGRAM: Access Code: [email or web address] URL:  [email or web address] Date: 05/22/2022 Prepared by: MoAndris BaumannExercises - Seated Hip Abduction with Resistance  - 2 x daily - 7 x weekly - 2 sets - 10 reps - Seated Hip Adduction Isometrics with Ball  - 2 x daily - 7 x weekly - 2 sets - 10 reps - Standing March with Counter Support  - 2 x daily - 7 x weekly - 2 sets - 10 reps    GOALS: Goals reviewed with patient? Yes  SHORT TERM GOALS: Target date: 07/03/22  Patient will be independent with initial HEP. Goal status: progressing  2.  Patient will be educated on strategies to decrease risk of falls.  Goal status: met   LONG TERM GOALS: Target date: 08/14/22  Patient will be independent with advanced/ongoing HEP to improve outcomes and carryover.  Goal status: INITIAL  2.  Patient will be able to ambulate 600' with LRAD with good safety to access community.  Baseline: using rollator Goal status: INITIAL  3.  Patient will be able to step up/down curb safely with LRAD for safety with community ambulation.  Baseline: unable to navigate curbs Goal status: INITIAL   4.  Patient will demonstrate improved functional LE strength as demonstrated by increase in 1 muscle grade in all weak groups. Goal status: Partly Met 10/5/23n  5.  Patient will demonstrate decreased fall risk by scoring < 15 sec on 5xSTS. Baseline: 18.99s Goal status: INITIAL  6.  Patient will demonstrate decreased fall risk by scoring < 10 sec on TUG. Baseline: 15.96s Goal status: Met 10.06 06/19/22  ASSESSMENT:  CLINICAL IMPRESSION:               Patient is doing well, she got the fit test requirements for her to qualify for surgery, I put it in the [place]older.  She is very close to the requirement on the 15 foot walk, the grip and the sit to stand, the balance will be difficult for her due to  the neuropathy.  I added some strength, we worked on the testing and then did a long walk.  Some fatigue with this   REHAB POTENTIAL:  Good  CLINICAL DECISION MAKING: Stable/uncomplicated  EVALUATION COMPLEXITY: Low  PLAN: PT FREQUENCY: 2x/week  PT DURATION: 12 weeks  PLANNED INTERVENTIONS: Therapeutic exercises, Therapeutic activity, Neuromuscular re-education, Balance training, Gait training, Patient/Family education, Self Care, Joint mobilization, Stair training, Cryotherapy, Moist heat, Manual lymph drainage, Compression bandaging, Ionotophoresis 5m/ml Dexamethasone, and Manual therapy  PLAN FOR NEXT SESSION: Work on core, functionality, balance if possible.  MLum Babe PT 06/26/2022, 10:53 AM

## 2022-06-30 NOTE — Therapy (Signed)
OUTPATIENT PHYSICAL THERAPY NEURO TREATMENT   Patient Name: Whitney Chavez MRN: [id number] DOB:[birth year], 66 y.o., female Today's Date: 07/01/2022   PCP: Kathyrn Lass REFERRING PROVIDER: Arlice Colt   PT End of Session - 07/01/22 1315     Visit Number 8    Date for PT Re-Evaluation 08/14/22    Authorization Type Medicare    PT Start Time 1315    PT Stop Time 1355    PT Time Calculation (min) 40 min    Activity Tolerance Patient tolerated treatment well    Behavior During Therapy [facility] for tasks assessed/performed               Past Medical History:  Diagnosis Date   Asthma    Axillary vein thrombosis ([place]) 08/16/2015   Calciphylaxis    Chronic kidney disease    Colitis    COPD (chronic obstructive pulmonary disease) ([place])    Factor V Leiden ([place])    Hypertension    Lymphedema    legs   MRSA (methicillin resistant staph aureus) culture positive    Neuropathy    Obstructive sleep apnea 03/29/2015   OSA on CPAP    Vision abnormalities    Past Surgical History:  Procedure Laterality Date   APPLICATION OF WOUND VAC     AV FISTULA PLACEMENT Left    ESOPHAGOGASTRODUODENOSCOPY (EGD) WITH PROPOFOL Bilateral 03/19/2022   Procedure: ESOPHAGOGASTRODUODENOSCOPY (EGD) WITH PROPOFOL;  Surgeon: Arta Silence, MD;  Location: [facility];  Service: Gastroenterology;  Laterality: Bilateral;   lumbar decompression fusion     TONSILLECTOMY     Patient Active Problem List   Diagnosis Date Noted   Eye redness 01/28/2021   Essential tremor 04/10/2020   Calciphylaxis 08/03/2018   Flatfoot 04/01/2018   Chronic pain syndrome 11/12/2017   Neuropathic pain 11/12/2017   COPD, moderate ([place]) 10/23/2017   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   Open abdominal wall wound 10/23/2017   Colitis 10/23/2017   Normocytic anemia 10/23/2017   Moderate major depression, single episode ([place]) 07/16/2017   Encounter for peritoneal dialysis catheter insertion ([place]) 06/18/2017   Panniculitis  04/28/2017   Long term current use of anticoagulant 01/17/2016   Factor V Leiden mutation ([place]) 01/17/2016   Axillary vein thrombosis ([place]) 08/16/2015   OSA on CPAP 03/29/2015   Factor 5 Leiden mutation, heterozygous ([place]) 03/29/2015   Insomnia 03/29/2015   Polyneuropathy 03/29/2015   Restless leg 03/29/2015   Gait disturbance 03/29/2015   Benign hypertension with end-stage renal disease ([place]) 03/01/2015   Asthma-COPD overlap syndrome 08/19/2013   Morbid (severe) obesity due to excess calories ([place]) 08/19/2013   Morbid obesity with BMI of 45.0-49.9, adult ([place]) 08/19/2013   End-stage renal disease on hemodialysis ([place]) 08/18/2013    ONSET DATE: 04/22/22  REFERRING DIAG: G62.9, R26.9  THERAPY DIAG:  Difficulty in walking, not elsewhere classified  Muscle weakness (generalized)  Other abnormalities of gait and mobility  Other lack of coordination  Unsteadiness on feet  Abnormal posture  Rationale for Evaluation and Treatment Rehabilitation  SUBJECTIVE:  SUBJECTIVE STATEMENT: I am doing good, I can therapy is helping because I feel stronger and am moving better. I want to continue working on things to help me get on the list.   Pt accompanied by: self  PERTINENT HISTORY: lumbar decompression fusion, COPD, CKD, neuropathy, dialysis   PAIN:  Are you having pain? Yes: NPRS scale: 0/10 Pain location: both knees  Pain description: toothachey grinding pain Aggravating factors: bending knees trying straighten knees out all the way  Relieving factors: staying off of knees   PRECAUTIONS: Fall  WEIGHT BEARING RESTRICTIONS No  FALLS: Has patient fallen in last 6 months? No  LIVING ENVIRONMENT: Lives with: lives alone Lives in: House/apartment Stairs: No Has following equipment at home:  Single point cane and Environmental consultant - 4 wheeled  PLOF: Independent  PATIENT GOALS walk better, balance better, strengthen my legs   OBJECTIVE:    COGNITION: Overall cognitive status: Within functional limits for tasks assessed   SENSATION: WFL   EDEMA:  Circumferential: patient has lymphedema in BLE and wears bandages   MUSCLE LENGTH: Hamstrings: tightness bilaterally, limited due to bandages and swelling from lymphedema   POSTURE: rounded shoulders, forward head, increased thoracic kyphosis, and flexed trunk   LOWER EXTREMITY ROM:   WFL, limited knee extension bilaterally due to calciphylaxis and lymphedema    LOWER EXTREMITY MMT:    MMT Right Eval Left Eval Right  06/19/22 Left 06/19/22  Hip flexion 4 4- 4+ 4  Hip extension      Hip abduction _0 Hip adduction _1 Hip internal rotation      Hip external rotation      Knee flexion _2 4+  Knee extension 3+ _3 Ankle dorsiflexion 5 5    Ankle plantarflexion 5 5    Ankle inversion      Ankle eversion      (Blank rows = not tested)  TRANSFERS: Assistive device utilized: Environmental consultant - 4 wheeled  Sit to stand: Complete Independence Stand to sit: Modified independence Chair to chair: Modified independence   GAIT: Gait pattern: step to pattern, decreased step length- Right, decreased step length- Left, decreased stride length, decreased trunk rotation, trunk flexed, wide BOS, poor foot clearance- Right, and poor foot clearance- Left Distance walked: in clinic distances  Assistive device utilized: Environmental consultant - 4 wheeled Level of assistance: Complete Independence Comments: primarily using rollator to ambulate but uses SPC for short distances   FUNCTIONAL TESTs:  5 times sit to stand: 18 seconds on 06/26/22 Timed up and go (TUG): 15.96s  TODAY'S TREATMENT:  07/01/22 NuStep L5 x59mns  S2S from elevated mat 3x5 with progressive lowering of table  Modified crunches on BOSU x10, then x10 holding out red ball   Grip strength with yellow 2x10 each hand Semi tandem and feet together 10s  HS curls greenTB 2x10 LAQ no weight 2x10   Rows and ext greenTB 2x10  06/26/22 Right Grip strength 33# BMI 38.1 Rolling start 15 foot walk test 7 seconds without device STS 3x5 5# straight arm pulls 2x10 Feet on ball K2C, trunk rotation, small bridges, isometric abs Gait with rollator out the back door and around to the front of th building  06/19/22 Gait 26 ft then 611fNo AD, pt looks down due to her neuropathy and not feeling feet, cues to increase step length with RLE   NuStep L5 x 6 min Ball kicks  Standing ball too Standing ball  toss on airex Sit to stand on airex  LAQ x10 each    06/17/22 Gait rollator 165f, Gait with SPC 1229fx2 seated rest after each trial NuStep L3 x 5 min LE only LAQ 3x5 each Limited ROM w/ LLE  S2S 2x10 one set with UE one set without  Standing ball toss  Standing ball kicks  Gait No AD 2034fpt looks down Hamstring curls green 2x12 Gait no AD 26 feet   06/12/22  TherEx:  Bridge with red TB and hip ABD x10 2 second holds Sidelying hip ABD x10 B red TB above knees  Attempted walking bridges- unable to tolerate, pain Attempted marching bridges- unable, weakness Gait about 150-160f50fth SBQC min guard cues for safe use of device Attempted SAQs and quad sets, unable due to proprioception even with max cues  Side stepping alongside edge of mat table x4 BUE HHA  Square stepping x3 CW/CCW BUE HHA  Nustep L5 BUEs/BLEs x6 minutes >100spm    06/10/22 S2S x10 then one lap 120ft48f NuStep L4 x 5 min   LAQ 1lb 2x10 unable to complete with LLE to grinding pain  1lb seated march 2x10 Side step length of mat table x3 Gait w/ SPC 120 ft     06/03/22  TherEx  Nustep L3 x6 minutes BLEs only  Attempted LAQs with resistance unable to tolerate due to knee pain, LAQs 0# x10 B Seated  clams shells red TB x15  Supine bridges red TB around knees x10  Sidelying clams  red TB 1x10 B STS 2x5 red TB above knees   Neuro Re-ed   Standing narrow BOS 3x30 seconds solid surface EC Semi-tandem stance 3x15 seconds B EO solid surface Semi-tandem stance 3 rounds with head turns horizontal plane       PATIENT EDUCATION: Education details: POC and HEP Person educated: Patient Education method: Explanation Education comprehension: verbalized understanding   HOME EXERCISE PROGRAM: Access Code: [email or web address] URL: [email or web address] Date: 05/22/2022 Prepared by: Spru[employer] Andris Baumannrcises - Seated Hip Abduction with Resistance  - 2 x daily - 7 x weekly - 2 sets - 10 reps - Seated Hip Adduction Isometrics with Ball  - 2 x daily - 7 x weekly - 2 sets - 10 reps - Standing March with Counter Support  - 2 x daily - 7 x weekly - 2 sets - 10 reps    GOALS: Goals reviewed with patient? Yes  SHORT TERM GOALS: Target date: 07/03/22  Patient will be independent with initial HEP. Goal status: progressing  2.  Patient will be educated on strategies to decrease risk of falls.  Goal status: met   LONG TERM GOALS: Target date: 08/14/22  Patient will be independent with advanced/ongoing HEP to improve outcomes and carryover.  Goal status: INITIAL  2.  Patient will be able to ambulate 600' with LRAD with good safety to access community.  Baseline: using rollator Goal status: INITIAL  3.  Patient will be able to step up/down curb safely with LRAD for safety with community ambulation.  Baseline: unable to navigate curbs Goal status: INITIAL   4.  Patient will demonstrate improved functional LE strength as demonstrated by increase in 1 muscle grade in all weak groups. Goal status: Partly Met 10/5/23n  5.  Patient will demonstrate decreased fall risk by scoring < 15 sec on 5xSTS. Baseline: 18.99s Goal status: INITIAL  6.  Patient will demonstrate decreased fall risk by scoring < 10 sec on TUG. Baseline:  15.96s Goal status: Met 10.06 06/19/22   ASSESSMENT:  CLINICAL IMPRESSION:  We focused on meeting some of the requirements for her fit test to get a transplant. Worked on grip strength and some balance, need help getting in to positions for feet together and half tandem but able to hold for 10s. Pain and difficulty with leg lifts on left leg.    REHAB POTENTIAL: Good  CLINICAL DECISION MAKING: Stable/uncomplicated  EVALUATION COMPLEXITY: Low  PLAN: PT FREQUENCY: 2x/week  PT DURATION: 12 weeks  PLANNED INTERVENTIONS: Therapeutic exercises, Therapeutic activity, Neuromuscular re-education, Balance training, Gait training, Patient/Family education, Self Care, Joint mobilization, Stair training, Cryotherapy, Moist heat, Manual lymph drainage, Compression bandaging, Ionotophoresis 18m/ml Dexamethasone, and Manual therapy  PLAN FOR NEXT SESSION: Work on core, functionality, balance if possible, walking laps, calf raises  MAndris Baumann DPT  07/01/2022, 1:56 PM

## 2022-07-01 ENCOUNTER — Ambulatory Visit: Payer: Medicare Other

## 2022-07-01 DIAGNOSIS — R2681 Unsteadiness on feet: Secondary | ICD-10-CM

## 2022-07-01 DIAGNOSIS — M6281 Muscle weakness (generalized): Secondary | ICD-10-CM

## 2022-07-01 DIAGNOSIS — R2689 Other abnormalities of gait and mobility: Secondary | ICD-10-CM

## 2022-07-01 DIAGNOSIS — R262 Difficulty in walking, not elsewhere classified: Secondary | ICD-10-CM

## 2022-07-01 DIAGNOSIS — R278 Other lack of coordination: Secondary | ICD-10-CM

## 2022-07-01 DIAGNOSIS — R293 Abnormal posture: Secondary | ICD-10-CM

## 2022-07-02 NOTE — Therapy (Signed)
OUTPATIENT PHYSICAL THERAPY NEURO TREATMENT   Patient Name: Whitney Chavez MRN: [id number] DOB:05-18-1956, 66 y.o., female Today's Date: 07/03/2022   PCP: Kathyrn Lass REFERRING PROVIDER: Arlice Colt   PT End of Session - 07/03/22 1057     Visit Number 9    Date for PT Re-Evaluation 08/14/22    Authorization Type Medicare    PT Start Time 1100    PT Stop Time 2811    PT Time Calculation (min) 45 min    Activity Tolerance Patient tolerated treatment well    Behavior During Therapy [facility] for tasks assessed/performed                Past Medical History:  Diagnosis Date   Asthma    Axillary vein thrombosis ([place]) 08/16/2015   Calciphylaxis    Chronic kidney disease    Colitis    COPD (chronic obstructive pulmonary disease) ([place])    Factor V Leiden ([place])    Hypertension    Lymphedema    legs   MRSA (methicillin resistant staph aureus) culture positive    Neuropathy    Obstructive sleep apnea 03/29/2015   OSA on CPAP    Vision abnormalities    Past Surgical History:  Procedure Laterality Date   APPLICATION OF WOUND VAC     AV FISTULA PLACEMENT Left    ESOPHAGOGASTRODUODENOSCOPY (EGD) WITH PROPOFOL Bilateral 03/19/2022   Procedure: ESOPHAGOGASTRODUODENOSCOPY (EGD) WITH PROPOFOL;  Surgeon: Arta Silence, MD;  [facility];  Service: Gastroenterology;  Laterality: Bilateral;   lumbar decompression fusion     TONSILLECTOMY     Patient Active Problem List   Diagnosis Date Noted   Eye redness 01/28/2021   Essential tremor 04/10/2020   Calciphylaxis 08/03/2018   Flatfoot 04/01/2018   Chronic pain syndrome 11/12/2017   Neuropathic pain 11/12/2017   COPD, moderate ([place]) 10/23/2017   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   Open abdominal wall wound 10/23/2017   Colitis 10/23/2017   Normocytic anemia 10/23/2017   Moderate major depression, single episode ([place]) 07/16/2017   Encounter for peritoneal dialysis catheter insertion ([place]) 06/18/2017   Panniculitis  04/28/2017   Long term current use of anticoagulant 01/17/2016   Factor V Leiden mutation ([place]) 01/17/2016   Axillary vein thrombosis ([place]) 08/16/2015   OSA on CPAP 03/29/2015   Factor 5 Leiden mutation, heterozygous ([place]) 03/29/2015   Insomnia 03/29/2015   Polyneuropathy 03/29/2015   Restless leg 03/29/2015   Gait disturbance 03/29/2015   Benign hypertension with end-stage renal disease ([place]) 03/01/2015   Asthma-COPD overlap syndrome 08/19/2013   Morbid (severe) obesity due to excess calories ([place]) 08/19/2013   Morbid obesity with BMI of 45.0-49.9, adult ([place]) 08/19/2013   End-stage renal disease on hemodialysis ([place]) 08/18/2013    ONSET DATE: 04/22/22  REFERRING DIAG: G62.9, R26.9  THERAPY DIAG:  Difficulty in walking, not elsewhere classified  Muscle weakness (generalized)  Other abnormalities of gait and mobility  Other lack of coordination  Unsteadiness on feet  Unspecified lack of coordination  Gait disturbance  Abnormal posture  Polyneuropathy  Rationale for Evaluation and Treatment Rehabilitation  SUBJECTIVE:  SUBJECTIVE STATEMENT: Doing fine not having pain but I didn't sleep well and have a runny nose today. Do not feel like I have a cold.   Pt accompanied by: self  PERTINENT HISTORY: lumbar decompression fusion, COPD, CKD, neuropathy, dialysis   PAIN:  Are you having pain? Yes: NPRS scale: 0/10 Pain location: both knees  Pain description: toothachey grinding pain Aggravating factors: bending knees trying straighten knees out all the way  Relieving factors: staying off of knees   PRECAUTIONS: Fall  WEIGHT BEARING RESTRICTIONS No  FALLS: Has patient fallen in last 6 months? No  LIVING ENVIRONMENT: Lives with: lives alone Lives in: House/apartment Stairs:  No Has following equipment at home: Single point cane and Environmental consultant - 4 wheeled  PLOF: Independent  PATIENT GOALS walk better, balance better, strengthen my legs   OBJECTIVE:    COGNITION: Overall cognitive status: Within functional limits for tasks assessed   SENSATION: WFL   EDEMA:  Circumferential: patient has lymphedema in BLE and wears bandages   MUSCLE LENGTH: Hamstrings: tightness bilaterally, limited due to bandages and swelling from lymphedema   POSTURE: rounded shoulders, forward head, increased thoracic kyphosis, and flexed trunk   LOWER EXTREMITY ROM:   WFL, limited knee extension bilaterally due to calciphylaxis and lymphedema    LOWER EXTREMITY MMT:    MMT Right Eval Left Eval Right  06/19/22 Left 06/19/22  Hip flexion 4 4- 4+ 4  Hip extension      Hip abduction _0 Hip adduction _1 Hip internal rotation      Hip external rotation      Knee flexion _2 4+  Knee extension 3+ _3 Ankle dorsiflexion 5 5    Ankle plantarflexion 5 5    Ankle inversion      Ankle eversion      (Blank rows = not tested)  TRANSFERS: Assistive device utilized: Environmental consultant - 4 wheeled  Sit to stand: Complete Independence Stand to sit: Modified independence Chair to chair: Modified independence   GAIT: Gait pattern: step to pattern, decreased step length- Right, decreased step length- Left, decreased stride length, decreased trunk rotation, trunk flexed, wide BOS, poor foot clearance- Right, and poor foot clearance- Left Distance walked: in clinic distances  Assistive device utilized: Environmental consultant - 4 wheeled Level of assistance: Complete Independence Comments: primarily using rollator to ambulate but uses SPC for short distances   FUNCTIONAL TESTs:  5 times sit to stand: 18 seconds on 06/26/22 Timed up and go (TUG): 15.96s  TODAY'S TREATMENT:  07/03/22 NuStep L5 x28mns Walking 2 laps with cane  greenTB twists for core 2x10 Shoulder ext 5# 2x10 Step ups on  airex in RW  S2S on airex 4x5  Side steps along mat   07/01/22 NuStep L5 x656ms  S2S from elevated mat 3x5 with progressive lowering of table  Modified crunches on BOSU x10, then x10 holding out red ball  Grip strength with yellow 2x10 each hand Semi tandem and feet together 10s  HS curls greenTB 2x10 LAQ no weight 2x10   Rows and ext greenTB 2x10  06/26/22 Right Grip strength 33# BMI 38.1 Rolling start 15 foot walk test 7 seconds without device STS 3x5 5# straight arm pulls 2x10 Feet on ball K2C, trunk rotation, small bridges, isometric abs Gait with rollator out the back door and around to the front of th building  06/19/22 Gait 26 ft then 6078fo AD, pt looks down  due to her neuropathy and not feeling feet, cues to increase step length with RLE   NuStep L5 x 6 min Ball kicks  Standing ball too Standing ball toss on airex Sit to stand on airex  LAQ x10 each    06/17/22 Gait rollator 110f, Gait with SPC 1231fx2 seated rest after each trial NuStep L3 x 5 min LE only LAQ 3x5 each Limited ROM w/ LLE  S2S 2x10 one set with UE one set without  Standing ball toss  Standing ball kicks  Gait No AD 2018fpt looks down Hamstring curls green 2x12 Gait no AD 26 feet   06/12/22  TherEx:  Bridge with red TB and hip ABD x10 2 second holds Sidelying hip ABD x10 B red TB above knees  Attempted walking bridges- unable to tolerate, pain Attempted marching bridges- unable, weakness Gait about 150-160f43fth SBQC min guard cues for safe use of device Attempted SAQs and quad sets, unable due to proprioception even with max cues  Side stepping alongside edge of mat table x4 BUE HHA  Square stepping x3 CW/CCW BUE HHA  Nustep L5 BUEs/BLEs x6 minutes >100spm    06/10/22 S2S x10 then one lap 120ft58f NuStep L4 x 5 min   LAQ 1lb 2x10 unable to complete with LLE to grinding pain  1lb seated march 2x10 Side step length of mat table x3 Gait w/ SPC 120 ft      06/03/22  TherEx  Nustep L3 x6 minutes BLEs only  Attempted LAQs with resistance unable to tolerate due to knee pain, LAQs 0# x10 B Seated  clams shells red TB x15  Supine bridges red TB around knees x10  Sidelying clams red TB 1x10 B STS 2x5 red TB above knees   Neuro Re-ed   Standing narrow BOS 3x30 seconds solid surface EC Semi-tandem stance 3x15 seconds B EO solid surface Semi-tandem stance 3 rounds with head turns horizontal plane       PATIENT EDUCATION: Education details: POC and HEP Person educated: Patient Education method: Explanation Education comprehension: verbalized understanding   HOME EXERCISE PROGRAM: Access Code: [email or web address] URL: [email or web address] Date: 05/22/2022 Prepared by: Elaf Clauson Andris Baumannrcises - Seated Hip Abduction with Resistance  - 2 x daily - 7 x weekly - 2 sets - 10 reps - Seated Hip Adduction Isometrics with Ball  - 2 x daily - 7 x weekly - 2 sets - 10 reps - Standing March with Counter Support  - 2 x daily - 7 x weekly - 2 sets - 10 reps    GOALS: Goals reviewed with patient? Yes  SHORT TERM GOALS: Target date: 07/03/22  Patient will be independent with initial HEP. Goal status: progressing  2.  Patient will be educated on strategies to decrease risk of falls.  Goal status: met   LONG TERM GOALS: Target date: 08/14/22  Patient will be independent with advanced/ongoing HEP to improve outcomes and carryover.  Goal status: INITIAL  2.  Patient will be able to ambulate 600' with LRAD with good safety to access community.  Baseline: using rollator Goal status: INITIAL  3.  Patient will be able to step up/down curb safely with LRAD for safety with community ambulation.  Baseline: unable to navigate curbs Goal status: INITIAL   4.  Patient will demonstrate improved functional LE strength as demonstrated by increase in 1 muscle grade in all weak groups. Goal status: Partly Met 10/5/23n  5.  Patient will  demonstrate decreased  fall risk by scoring < 15 sec on 5xSTS. Baseline: 18.99s Goal status: INITIAL  6.  Patient will demonstrate decreased fall risk by scoring < 10 sec on TUG. Baseline: 15.96s Goal status: Met 10.06 06/19/22  ASSESSMENT:  CLINICAL IMPRESSION:  Patient not feeling the best due to having a runny nose. She is a bit more fatigued today with walking and sit to stands, and states she is tired today. Still gives a good effort throughout session.    REHAB POTENTIAL: Good  CLINICAL DECISION MAKING: Stable/uncomplicated  EVALUATION COMPLEXITY: Low  PLAN: PT FREQUENCY: 2x/week  PT DURATION: 12 weeks  PLANNED INTERVENTIONS: Therapeutic exercises, Therapeutic activity, Neuromuscular re-education, Balance training, Gait training, Patient/Family education, Self Care, Joint mobilization, Stair training, Cryotherapy, Moist heat, Manual lymph drainage, Compression bandaging, Ionotophoresis 66m/ml Dexamethasone, and Manual therapy  PLAN FOR NEXT SESSION: Work on core, functionality, balance if possible, walking laps, calf raises  MAndris Baumann DPT  07/03/2022, 11:46 AM

## 2022-07-03 ENCOUNTER — Ambulatory Visit: Payer: Medicare Other

## 2022-07-03 DIAGNOSIS — G629 Polyneuropathy, unspecified: Secondary | ICD-10-CM

## 2022-07-03 DIAGNOSIS — R278 Other lack of coordination: Secondary | ICD-10-CM

## 2022-07-03 DIAGNOSIS — M6281 Muscle weakness (generalized): Secondary | ICD-10-CM

## 2022-07-03 DIAGNOSIS — R2689 Other abnormalities of gait and mobility: Secondary | ICD-10-CM | POA: Diagnosis not present

## 2022-07-03 DIAGNOSIS — R262 Difficulty in walking, not elsewhere classified: Secondary | ICD-10-CM

## 2022-07-03 DIAGNOSIS — R279 Unspecified lack of coordination: Secondary | ICD-10-CM

## 2022-07-03 DIAGNOSIS — R2681 Unsteadiness on feet: Secondary | ICD-10-CM

## 2022-07-03 DIAGNOSIS — R293 Abnormal posture: Secondary | ICD-10-CM

## 2022-07-03 DIAGNOSIS — R269 Unspecified abnormalities of gait and mobility: Secondary | ICD-10-CM

## 2022-07-08 ENCOUNTER — Encounter: Payer: Self-pay | Admitting: Physical Therapy

## 2022-07-08 ENCOUNTER — Ambulatory Visit: Payer: Medicare Other | Admitting: Physical Therapy

## 2022-07-08 DIAGNOSIS — R2681 Unsteadiness on feet: Secondary | ICD-10-CM

## 2022-07-08 DIAGNOSIS — R262 Difficulty in walking, not elsewhere classified: Secondary | ICD-10-CM

## 2022-07-08 DIAGNOSIS — M6281 Muscle weakness (generalized): Secondary | ICD-10-CM

## 2022-07-08 DIAGNOSIS — R2689 Other abnormalities of gait and mobility: Secondary | ICD-10-CM | POA: Diagnosis not present

## 2022-07-08 NOTE — Therapy (Signed)
OUTPATIENT PHYSICAL THERAPY NEURO TREATMENT   Patient Name: Whitney Chavez MRN: [id number] DOB:1956/01/17, 66 y.o., female Today's Date: 07/08/2022 Progress Note Reporting Period 05/22/22 to 07/08/22  See note below for Objective Data and Assessment of Progress/Goals.      PCP: Kathyrn Lass REFERRING PROVIDER: Arlice Colt   PT End of Session - 07/08/22 0926     Visit Number 10    Date for PT Re-Evaluation 08/14/22    PT Start Time 0926    PT Stop Time 1015    PT Time Calculation (min) 49 min    Activity Tolerance Patient tolerated treatment well    Behavior During Therapy [facility] for tasks assessed/performed                Past Medical History:  Diagnosis Date   Asthma    Axillary vein thrombosis ([place]) 08/16/2015   Calciphylaxis    Chronic kidney disease    Colitis    COPD (chronic obstructive pulmonary disease) (HCC)    Factor V Leiden (HCC)    Hypertension    Lymphedema    legs   MRSA (methicillin resistant staph aureus) culture positive    Neuropathy    Obstructive sleep apnea 03/29/2015   OSA on CPAP    Vision abnormalities    Past Surgical History:  Procedure Laterality Date   APPLICATION OF WOUND VAC     AV FISTULA PLACEMENT Left    ESOPHAGOGASTRODUODENOSCOPY (EGD) WITH PROPOFOL Bilateral 03/19/2022   Procedure: ESOPHAGOGASTRODUODENOSCOPY (EGD) WITH PROPOFOL;  Surgeon: Arta Silence, MD;  Location: [facility];  Service: Gastroenterology;  Laterality: Bilateral;   lumbar decompression fusion     TONSILLECTOMY     Patient Active Problem List   Diagnosis Date Noted   Eye redness 01/28/2021   Essential tremor 04/10/2020   Calciphylaxis 08/03/2018   Flatfoot 04/01/2018   Chronic pain syndrome 11/12/2017   Neuropathic pain 11/12/2017   COPD, moderate ([place]) 10/23/2017   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   Open abdominal wall wound 10/23/2017   Colitis 10/23/2017   Normocytic anemia 10/23/2017   Moderate major depression, single episode ([place]) 07/16/2017    Encounter for peritoneal dialysis catheter insertion ([place]) 06/18/2017   Panniculitis 04/28/2017   Long term current use of anticoagulant 01/17/2016   Factor V Leiden mutation ([place]) 01/17/2016   Axillary vein thrombosis ([place]) 08/16/2015   OSA on CPAP 03/29/2015   Factor 5 Leiden mutation, heterozygous ([place]) 03/29/2015   Insomnia 03/29/2015   Polyneuropathy 03/29/2015   Restless leg 03/29/2015   Gait disturbance 03/29/2015   Benign hypertension with end-stage renal disease ([place]) 03/01/2015   Asthma-COPD overlap syndrome 08/19/2013   Morbid (severe) obesity due to excess calories ([place]) 08/19/2013   Morbid obesity with BMI of 45.0-49.9, adult ([place]) 08/19/2013   End-stage renal disease on hemodialysis ([place]) 08/18/2013    ONSET DATE: 04/22/22  REFERRING DIAG: G62.9, R26.9  THERAPY DIAG:  Difficulty in walking, not elsewhere classified  Muscle weakness (generalized)  Other abnormalities of gait and mobility  Unsteadiness on feet  Rationale for Evaluation and Treatment Rehabilitation  SUBJECTIVE:  SUBJECTIVE STATEMENT: "I feel good today"  Pt accompanied by: self  PERTINENT HISTORY: lumbar decompression fusion, COPD, CKD, neuropathy, dialysis   PAIN:  Are you having pain? Yes: NPRS scale: 2/10 Pain location: both knees  Pain description: toothachey grinding pain Aggravating factors: bending knees trying straighten knees out all the way  Relieving factors: staying off of knees   PRECAUTIONS: Fall  WEIGHT BEARING RESTRICTIONS No  FALLS: Has patient fallen in last 6 months? No  LIVING ENVIRONMENT: Lives with: lives alone Lives in: House/apartment Stairs: No Has following equipment at home: Single point cane and Environmental consultant - 4 wheeled  PLOF: Independent  PATIENT GOALS walk better,  balance better, strengthen my legs   OBJECTIVE:    COGNITION: Overall cognitive status: Within functional limits for tasks assessed   SENSATION: WFL   EDEMA:  Circumferential: patient has lymphedema in BLE and wears bandages   MUSCLE LENGTH: Hamstrings: tightness bilaterally, limited due to bandages and swelling from lymphedema   POSTURE: rounded shoulders, forward head, increased thoracic kyphosis, and flexed trunk   LOWER EXTREMITY ROM:   WFL, limited knee extension bilaterally due to calciphylaxis and lymphedema    LOWER EXTREMITY MMT:    MMT Right Eval Left Eval Right  06/19/22 Left 06/19/22 Right 07/08/22 Left  07/08/22  Hip flexion 4 4- 4+ 4 4+ 4+  Hip extension        Hip abduction '4 4 5 5 5 5  ' Hip adduction '4 4 5 5 5 5  ' Hip internal rotation        Hip external rotation        Knee flexion '3 3 5 ' 4+ 5 5  Knee extension 3+ '3 4 3 4 ' grinding pain 3+ grinding pain  Ankle dorsiflexion 5 5      Ankle plantarflexion 5 5      Ankle inversion        Ankle eversion        (Blank rows = not tested)  TRANSFERS: Assistive device utilized: Environmental consultant - 4 wheeled  Sit to stand: Complete Independence Stand to sit: Modified independence Chair to chair: Modified independence   GAIT: Gait pattern: step to pattern, decreased step length- Right, decreased step length- Left, decreased stride length, decreased trunk rotation, trunk flexed, wide BOS, poor foot clearance- Right, and poor foot clearance- Left Distance walked: in clinic distances  Assistive device utilized: Environmental consultant - 4 wheeled Level of assistance: Complete Independence Comments: primarily using rollator to ambulate but uses SPC for short distances   FUNCTIONAL TESTs:  5 times sit to stand: 18 seconds on 06/26/22 Timed up and go (TUG): 15.96s  TODAY'S TREATMENT:  07/07/22 NuStep L5 x 7 min GOALS Stair negotiation 4 and 6 in, heavy UE use, L knee pain when descending stairs Shoulder Ext red 2x10 ALT 6in box  taps SPC 2x10 LAQ 2lb 3x10 Hip add ball squeezes   07/03/22 NuStep L5 x54mns Walking 2 laps with cane  greenTB twists for core 2x10 Shoulder ext 5# 2x10 Step ups on airex in RW  S2S on airex 4x5  Side steps along mat   07/01/22 NuStep L5 x619ms  S2S from elevated mat 3x5 with progressive lowering of table  Modified crunches on BOSU x10, then x10 holding out red ball  Grip strength with yellow 2x10 each hand Semi tandem and feet together 10s  HS curls greenTB 2x10 LAQ no weight 2x10   Rows and ext greenTB 2x10  06/26/22 Right Grip strength 33# BMI 38.1 Rolling  start 15 foot walk test 7 seconds without device STS 3x5 5# straight arm pulls 2x10 Feet on ball K2C, trunk rotation, small bridges, isometric abs Gait with rollator out the back door and around to the front of th building  PATIENT EDUCATION: Education details: POC and HEP Person educated: Patient Education method: Explanation Education comprehension: verbalized understanding   HOME EXERCISE PROGRAM: Access Code: [email or web address] URL: [email or web address] Date: 05/22/2022 Prepared by: Andris Baumann  Exercises - Seated Hip Abduction with Resistance  - 2 x daily - 7 x weekly - 2 sets - 10 reps - Seated Hip Adduction Isometrics with Ball  - 2 x daily - 7 x weekly - 2 sets - 10 reps - Standing March with Counter Support  - 2 x daily - 7 x weekly - 2 sets - 10 reps    GOALS: Goals reviewed with patient? Yes  SHORT TERM GOALS: Target date: 07/03/22  Patient will be independent with initial HEP. Goal status: Met  2.  Patient will be educated on strategies to decrease risk of falls.  Goal status: Met   LONG TERM GOALS: Target date: 08/14/22  Patient will be independent with advanced/ongoing HEP to improve outcomes and carryover.  Goal status: INITIAL  2.  Patient will be able to ambulate 600' with LRAD with good safety to access community.  Baseline: using rollator Goal status: 357f SPC  Progressing 07/08/22  3.  Patient will be able to step up/down curb safely with LRAD for safety with community ambulation.  Baseline: unable to navigate curbs Goal status: INITIAL   4.  Patient will demonstrate improved functional LE strength as demonstrated by increase in 1 muscle grade in all weak groups. Goal status: All muscle groups have met goal except for quads pain prevent maximum effort 07/08/22  5.  Patient will demonstrate decreased fall risk by scoring < 15 sec on 5xSTS. Baseline: 18.99s Goal status: 13.66s Met 07/08/22  6.  Patient will demonstrate decreased fall risk by scoring < 10 sec on TUG. Baseline: 15.96s Goal status: Met 10.06 06/19/22  ASSESSMENT:  CLINICAL IMPRESSION:  Pt enters doing well, She has progressed meeting some LTGs.  Pt gives a good effort throughout session. Difficulty with stair negotiation due to knee pain. Knee pain prevent pt form giving maximum effort with MMT.     REHAB POTENTIAL: Good  CLINICAL DECISION MAKING: Stable/uncomplicated  EVALUATION COMPLEXITY: Low  PLAN: PT FREQUENCY: 2x/week  PT DURATION: 12 weeks  PLANNED INTERVENTIONS: Therapeutic exercises, Therapeutic activity, Neuromuscular re-education, Balance training, Gait training, Patient/Family education, Self Care, Joint mobilization, Stair training, Cryotherapy, Moist heat, Manual lymph drainage, Compression bandaging, Ionotophoresis 460mml Dexamethasone, and Manual therapy  PLAN FOR NEXT SESSION: Work on core, functionality, balance if possible, walking laps, calf raises  RoCheri FowlerPTA MoAndris BaumannDPT 07/08/2022, 9:29 AM

## 2022-07-10 ENCOUNTER — Ambulatory Visit: Payer: Medicare Other | Admitting: Physical Therapy

## 2022-07-10 ENCOUNTER — Encounter: Payer: Self-pay | Admitting: Physical Therapy

## 2022-07-10 DIAGNOSIS — R2689 Other abnormalities of gait and mobility: Secondary | ICD-10-CM

## 2022-07-10 DIAGNOSIS — R262 Difficulty in walking, not elsewhere classified: Secondary | ICD-10-CM

## 2022-07-10 DIAGNOSIS — R2681 Unsteadiness on feet: Secondary | ICD-10-CM

## 2022-07-10 DIAGNOSIS — M6281 Muscle weakness (generalized): Secondary | ICD-10-CM

## 2022-07-10 NOTE — Therapy (Signed)
OUTPATIENT PHYSICAL THERAPY NEURO TREATMENT   Patient Name: Whitney Chavez MRN: [id number] DOB:01-20-1956, 66 y.o., female Today's Date: 07/10/2022   PCP: Kathyrn Lass REFERRING PROVIDER: Arlice Colt   PT End of Session - 07/10/22 1058     Visit Number 11    Date for PT Re-Evaluation 08/14/22    PT Start Time 1100    PT Stop Time 3578    PT Time Calculation (min) 45 min    Activity Tolerance Patient tolerated treatment well    Behavior During Therapy [facility] for tasks assessed/performed                Past Medical History:  Diagnosis Date   Asthma    Axillary vein thrombosis ([place]) 08/16/2015   Calciphylaxis    Chronic kidney disease    Colitis    COPD (chronic obstructive pulmonary disease) ([place])    Factor V Leiden ([place])    Hypertension    Lymphedema    legs   MRSA (methicillin resistant staph aureus) culture positive    Neuropathy    Obstructive sleep apnea 03/29/2015   OSA on CPAP    Vision abnormalities    Past Surgical History:  Procedure Laterality Date   APPLICATION OF WOUND VAC     AV FISTULA PLACEMENT Left    ESOPHAGOGASTRODUODENOSCOPY (EGD) WITH PROPOFOL Bilateral 03/19/2022   Procedure: ESOPHAGOGASTRODUODENOSCOPY (EGD) WITH PROPOFOL;  Surgeon: Arta Silence, MD;  [facility];  Service: Gastroenterology;  Laterality: Bilateral;   lumbar decompression fusion     TONSILLECTOMY     Patient Active Problem List   Diagnosis Date Noted   Eye redness 01/28/2021   Essential tremor 04/10/2020   Calciphylaxis 08/03/2018   Flatfoot 04/01/2018   Chronic pain syndrome 11/12/2017   Neuropathic pain 11/12/2017   COPD, moderate ([place]) 10/23/2017   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   Open abdominal wall wound 10/23/2017   Colitis 10/23/2017   Normocytic anemia 10/23/2017   Moderate major depression, single episode ([place]) 07/16/2017   Encounter for peritoneal dialysis catheter insertion ([place]) 06/18/2017   Panniculitis 04/28/2017   Long term current use of  anticoagulant 01/17/2016   Factor V Leiden mutation ([place]) 01/17/2016   Axillary vein thrombosis ([place]) 08/16/2015   OSA on CPAP 03/29/2015   Factor 5 Leiden mutation, heterozygous ([place]) 03/29/2015   Insomnia 03/29/2015   Polyneuropathy 03/29/2015   Restless leg 03/29/2015   Gait disturbance 03/29/2015   Benign hypertension with end-stage renal disease ([place]) 03/01/2015   Asthma-COPD overlap syndrome 08/19/2013   Morbid (severe) obesity due to excess calories ([place]) 08/19/2013   Morbid obesity with BMI of 45.0-49.9, adult ([place]) 08/19/2013   End-stage renal disease on hemodialysis ([place]) 08/18/2013    ONSET DATE: 04/22/22  REFERRING DIAG: G62.9, R26.9  THERAPY DIAG:  Difficulty in walking, not elsewhere classified  Muscle weakness (generalized)  Other abnormalities of gait and mobility  Unsteadiness on feet  Rationale for Evaluation and Treatment Rehabilitation  SUBJECTIVE:  SUBJECTIVE STATEMENT: "Im doing fine"  Pt accompanied by: self  PERTINENT HISTORY: lumbar decompression fusion, COPD, CKD, neuropathy, dialysis   PAIN:  Are you having pain? Yes: NPRS scale: 2/10 Pain location: both knees  Pain description: toothachey grinding pain Aggravating factors: bending knees trying straighten knees out all the way  Relieving factors: staying off of knees   PRECAUTIONS: Fall  WEIGHT BEARING RESTRICTIONS No  FALLS: Has patient fallen in last 6 months? No  LIVING ENVIRONMENT: Lives with: lives alone Lives in: House/apartment Stairs: No Has following equipment at home: Single point cane and Environmental consultant - 4 wheeled  PLOF: Independent  PATIENT GOALS walk better, balance better, strengthen my legs   OBJECTIVE:    COGNITION: Overall cognitive status: Within functional limits for tasks  assessed   SENSATION: WFL   EDEMA:  Circumferential: patient has lymphedema in BLE and wears bandages   MUSCLE LENGTH: Hamstrings: tightness bilaterally, limited due to bandages and swelling from lymphedema   POSTURE: rounded shoulders, forward head, increased thoracic kyphosis, and flexed trunk   LOWER EXTREMITY ROM:   WFL, limited knee extension bilaterally due to calciphylaxis and lymphedema    LOWER EXTREMITY MMT:    MMT Right Eval Left Eval Right  06/19/22 Left 06/19/22 Right 07/08/22 Left  07/08/22  Hip flexion 4 4- 4+ 4 4+ 4+  Hip extension        Hip abduction '4 4 5 5 5 5  ' Hip adduction '4 4 5 5 5 5  ' Hip internal rotation        Hip external rotation        Knee flexion '3 3 5 ' 4+ 5 5  Knee extension 3+ '3 4 3 4 ' grinding pain 3+ grinding pain  Ankle dorsiflexion 5 5      Ankle plantarflexion 5 5      Ankle inversion        Ankle eversion        (Blank rows = not tested)  TRANSFERS: Assistive device utilized: Environmental consultant - 4 wheeled  Sit to stand: Complete Independence Stand to sit: Modified independence Chair to chair: Modified independence   GAIT: Gait pattern: step to pattern, decreased step length- Right, decreased step length- Left, decreased stride length, decreased trunk rotation, trunk flexed, wide BOS, poor foot clearance- Right, and poor foot clearance- Left Distance walked: in clinic distances  Assistive device utilized: Environmental consultant - 4 wheeled Level of assistance: Complete Independence Comments: primarily using rollator to ambulate but uses SPC for short distances   FUNCTIONAL TESTs:  5 times sit to stand: 18 seconds on 06/26/22 Timed up and go (TUG): 15.96s  TODAY'S TREATMENT:  07/10/22  NuStep L5 x 6 min Gait SPC 340f Gait no AD 257fx2  Standing Rows red 2x10 Standing shoulder Ext red 2x10 Gait no AD 75 ft, LOB x1 needed HHA x1 after 3077feated crunches BOSU at back  2x12 S2S 3x5  Standing ball toss   07/07/22 NuStep L5 x 7  min GOALS Stair negotiation 4 and 6 in, heavy UE use, L knee pain when descending stairs Shoulder Ext red 2x10 ALT 6in box taps SPC 2x10 LAQ 2lb 3x10 Hip add ball squeezes   07/03/22 NuStep L5 x6mi57mWalking 2 laps with cane  greenTB twists for core 2x10 Shoulder ext 5# 2x10 Step ups on airex in RW  S2S on airex 4x5  Side steps along mat   07/01/22 NuStep L5 x6min34mS2S from elevated mat 3x5 with progressive lowering of table  Modified crunches on BOSU x10, then x10 holding out red ball  Grip strength with yellow 2x10 each hand Semi tandem and feet together 10s  HS curls greenTB 2x10 LAQ no weight 2x10   Rows and ext greenTB 2x10  06/26/22 Right Grip strength 33# BMI 38.1 Rolling start 15 foot walk test 7 seconds without device STS 3x5 5# straight arm pulls 2x10 Feet on ball K2C, trunk rotation, small bridges, isometric abs Gait with rollator out the back door and around to the front of th building  PATIENT EDUCATION: Education details: POC and HEP Person educated: Patient Education method: Explanation Education comprehension: verbalized understanding   HOME EXERCISE PROGRAM: Access Code: [email or web address] URL: [email or web address] Date: 05/22/2022 Prepared by: Andris Baumann  Exercises - Seated Hip Abduction with Resistance  - 2 x daily - 7 x weekly - 2 sets - 10 reps - Seated Hip Adduction Isometrics with Ball  - 2 x daily - 7 x weekly - 2 sets - 10 reps - Standing March with Counter Support  - 2 x daily - 7 x weekly - 2 sets - 10 reps    GOALS: Goals reviewed with patient? Yes  SHORT TERM GOALS: Target date: 07/03/22  Patient will be independent with initial HEP. Goal status: Met  2.  Patient will be educated on strategies to decrease risk of falls.  Goal status: Met   LONG TERM GOALS: Target date: 08/14/22  Patient will be independent with advanced/ongoing HEP to improve outcomes and carryover.  Goal status: INITIAL  2.  Patient will be  able to ambulate 600' with LRAD with good safety to access community.  Baseline: using rollator Goal status: 375f SPC Progressing 07/08/22  3.  Patient will be able to step up/down curb safely with LRAD for safety with community ambulation.  Baseline: unable to navigate curbs Goal status: INITIAL   4.  Patient will demonstrate improved functional LE strength as demonstrated by increase in 1 muscle grade in all weak groups. Goal status: All muscle groups have met goal except for quads pain prevent maximum effort 07/08/22  5.  Patient will demonstrate decreased fall risk by scoring < 15 sec on 5xSTS. Baseline: 18.99s Goal status: 13.66s Met 07/08/22  6.  Patient will demonstrate decreased fall risk by scoring < 10 sec on TUG. Baseline: 15.96s Goal status: Met 10.06 06/19/22  ASSESSMENT:  CLINICAL IMPRESSION:  Pt enters doing well, She did well with a progressed session. Pt gives a good effort throughout session. Some fatigue and LOB with second gait trial without AD, requiring assist to regain balance. PT able to complete sit to stands but with some effort. Some increase in R knee pain with standing activities. REHAB POTENTIAL: Good  CLINICAL DECISION MAKING: Stable/uncomplicated  EVALUATION COMPLEXITY: Low  PLAN: PT FREQUENCY: 2x/week  PT DURATION: 12 weeks  PLANNED INTERVENTIONS: Therapeutic exercises, Therapeutic activity, Neuromuscular re-education, Balance training, Gait training, Patient/Family education, Self Care, Joint mobilization, Stair training, Cryotherapy, Moist heat, Manual lymph drainage, Compression bandaging, Ionotophoresis 474mml Dexamethasone, and Manual therapy  PLAN FOR NEXT SESSION: Work on core, functionality, balance if possible, walking laps, calf raises  RoCheri FowlerPTA MoAndris BaumannDPT 07/10/2022, 10:59 AM

## 2022-07-17 ENCOUNTER — Ambulatory Visit: Payer: Medicare Other | Attending: Neurology | Admitting: Physical Therapy

## 2022-07-17 DIAGNOSIS — R262 Difficulty in walking, not elsewhere classified: Secondary | ICD-10-CM | POA: Diagnosis not present

## 2022-07-17 DIAGNOSIS — R2689 Other abnormalities of gait and mobility: Secondary | ICD-10-CM | POA: Diagnosis present

## 2022-07-17 DIAGNOSIS — R278 Other lack of coordination: Secondary | ICD-10-CM | POA: Insufficient documentation

## 2022-07-17 DIAGNOSIS — R279 Unspecified lack of coordination: Secondary | ICD-10-CM | POA: Diagnosis present

## 2022-07-17 DIAGNOSIS — R293 Abnormal posture: Secondary | ICD-10-CM | POA: Insufficient documentation

## 2022-07-17 DIAGNOSIS — R2681 Unsteadiness on feet: Secondary | ICD-10-CM | POA: Insufficient documentation

## 2022-07-17 DIAGNOSIS — R269 Unspecified abnormalities of gait and mobility: Secondary | ICD-10-CM | POA: Diagnosis present

## 2022-07-17 DIAGNOSIS — M6281 Muscle weakness (generalized): Secondary | ICD-10-CM | POA: Diagnosis present

## 2022-07-17 NOTE — Therapy (Signed)
OUTPATIENT PHYSICAL THERAPY NEURO TREATMENT   Patient Name: Whitney Chavez MRN: [id number] DOB:November 18, 1955, 66 y.o., female Today's Date: 07/17/2022   PCP: Kathyrn Lass REFERRING PROVIDER: Arlice Colt   PT End of Session - 07/17/22 0923     Visit Number 12    Date for PT Re-Evaluation 08/14/22    Authorization Type Medicare    PT Start Time 0925    PT Stop Time 1937    PT Time Calculation (min) 50 min                Past Medical History:  Diagnosis Date   Asthma    Axillary vein thrombosis ([place]) 08/16/2015   Calciphylaxis    Chronic kidney disease    Colitis    COPD (chronic obstructive pulmonary disease) ([place])    Factor V Leiden ([place])    Hypertension    Lymphedema    legs   MRSA (methicillin resistant staph aureus) culture positive    Neuropathy    Obstructive sleep apnea 03/29/2015   OSA on CPAP    Vision abnormalities    Past Surgical History:  Procedure Laterality Date   APPLICATION OF WOUND VAC     AV FISTULA PLACEMENT Left    ESOPHAGOGASTRODUODENOSCOPY (EGD) WITH PROPOFOL Bilateral 03/19/2022   Procedure: ESOPHAGOGASTRODUODENOSCOPY (EGD) WITH PROPOFOL;  Surgeon: Arta Silence, MD;  Location: [facility];  Service: Gastroenterology;  Laterality: Bilateral;   lumbar decompression fusion     TONSILLECTOMY     Patient Active Problem List   Diagnosis Date Noted   Eye redness 01/28/2021   Essential tremor 04/10/2020   Calciphylaxis 08/03/2018   Flatfoot 04/01/2018   Chronic pain syndrome 11/12/2017   Neuropathic pain 11/12/2017   COPD, moderate ([place]) 10/23/2017   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   Open abdominal wall wound 10/23/2017   Colitis 10/23/2017   Normocytic anemia 10/23/2017   Moderate major depression, single episode ([place]) 07/16/2017   Encounter for peritoneal dialysis catheter insertion ([place]) 06/18/2017   Panniculitis 04/28/2017   Long term current use of anticoagulant 01/17/2016   Factor V Leiden mutation ([place]) 01/17/2016   Axillary  vein thrombosis ([place]) 08/16/2015   OSA on CPAP 03/29/2015   Factor 5 Leiden mutation, heterozygous ([place]) 03/29/2015   Insomnia 03/29/2015   Polyneuropathy 03/29/2015   Restless leg 03/29/2015   Gait disturbance 03/29/2015   Benign hypertension with end-stage renal disease ([place]) 03/01/2015   Asthma-COPD overlap syndrome 08/19/2013   Morbid (severe) obesity due to excess calories ([place]) 08/19/2013   Morbid obesity with BMI of 45.0-49.9, adult ([place]) 08/19/2013   End-stage renal disease on hemodialysis ([place]) 08/18/2013    ONSET DATE: 04/22/22  REFERRING DIAG: G62.9, R26.9  THERAPY DIAG:  Difficulty in walking, not elsewhere classified  Muscle weakness (generalized)  Unsteadiness on feet  Rationale for Evaluation and Treatment Rehabilitation  SUBJECTIVE:  SUBJECTIVE STATEMENT: "Doing really well" PT is really helping  Pt accompanied by: self  PERTINENT HISTORY: lumbar decompression fusion, COPD, CKD, neuropathy, dialysis   PAIN:  Are you having pain? Yes: NPRS scale: 1/10 Pain location: both knees  Pain description: toothachey grinding pain Aggravating factors: bending knees trying straighten knees out all the way  Relieving factors: staying off of knees   PRECAUTIONS: Fall  WEIGHT BEARING RESTRICTIONS No  FALLS: Has patient fallen in last 6 months? No  LIVING ENVIRONMENT: Lives with: lives alone Lives in: House/apartment Stairs: No Has following equipment at home: Single point cane and Environmental consultant - 4 wheeled  PLOF: Independent  PATIENT GOALS walk better, balance better, strengthen my legs   OBJECTIVE:    COGNITION: Overall cognitive status: Within functional limits for tasks assessed   SENSATION: WFL   EDEMA:  Circumferential: patient has lymphedema in BLE and wears  bandages   MUSCLE LENGTH: Hamstrings: tightness bilaterally, limited due to bandages and swelling from lymphedema   POSTURE: rounded shoulders, forward head, increased thoracic kyphosis, and flexed trunk   LOWER EXTREMITY ROM:   WFL, limited knee extension bilaterally due to calciphylaxis and lymphedema    LOWER EXTREMITY MMT:    MMT Right Eval Left Eval Right  06/19/22 Left 06/19/22 Right 07/08/22 Left  07/08/22  Hip flexion 4 4- 4+ 4 4+ 4+  Hip extension        Hip abduction _0 Hip adduction _1 Hip internal rotation        Hip external rotation        Knee flexion _2 4+ 5 5  Knee extension 3+ _3 grinding pain 3+ grinding pain  Ankle dorsiflexion 5 5      Ankle plantarflexion 5 5      Ankle inversion        Ankle eversion        (Blank rows = not tested)  TRANSFERS: Assistive device utilized: Environmental consultant - 4 wheeled  Sit to stand: Complete Independence Stand to sit: Modified independence Chair to chair: Modified independence   GAIT: Gait pattern: step to pattern, decreased step length- Right, decreased step length- Left, decreased stride length, decreased trunk rotation, trunk flexed, wide BOS, poor foot clearance- Right, and poor foot clearance- Left Distance walked: in clinic distances  Assistive device utilized: Environmental consultant - 4 wheeled Level of assistance: Complete Independence Comments: primarily using rollator to ambulate but uses SPC for short distances   FUNCTIONAL TESTs:  5 times sit to stand: 18 seconds on 06/26/22 Timed up and go (TUG): 15.96s  TODAY'S TREATMENT:   07/17/22 Nustep L5 6 min Amb 4 laps 480 feet with SPC cuing to look up and slow down for last lap SBA Gait no AD 85f x2  CGA STS with 3# chest press 2 sets 5 4 inch step up with 4WW 5 x each leg 2 sets RT stronger than left Standing Rows red 2x10 Standing shoulder Ext red 2x10 Standing Rows red 2x10 Standing shoulder Ext red 2x10     07/10/22  NuStep L5 x 6  min Gait SPC 3666fGait no AD 2585f2  Standing Rows red 2x10 Standing shoulder Ext red 2x10 Gait no AD 75 ft, LOB x1 needed HHA x1 after 31f28fanding Rows red 2x10 Standing shoulder Ext red 2x10 S2S 3x5  Standing ball toss   07/07/22 NuStep L5 x 7 min GOALS Stair negotiation 4  and 6 in, heavy UE use, L knee pain when descending stairs Shoulder Ext red 2x10 ALT 6in box taps SPC 2x10 LAQ 2lb 3x10 Hip add ball squeezes   07/03/22 NuStep L5 x34mns Walking 2 laps with cane  greenTB twists for core 2x10 Shoulder ext 5# 2x10 Step ups on airex in RW  S2S on airex 4x5  Side steps along mat   07/01/22 NuStep L5 x64ms  S2S from elevated mat 3x5 with progressive lowering of table  Modified crunches on BOSU x10, then x10 holding out red ball  Grip strength with yellow 2x10 each hand Semi tandem and feet together 10s  HS curls greenTB 2x10 LAQ no weight 2x10   Rows and ext greenTB 2x10  06/26/22 Right Grip strength 33# BMI 38.1 Rolling start 15 foot walk test 7 seconds without device STS 3x5 5# straight arm pulls 2x10 Feet on ball K2C, trunk rotation, small bridges, isometric abs Gait with rollator out the back door and around to the front of th building  PATIENT EDUCATION: Education details: POC and HEP Person educated: Patient Education method: Explanation Education comprehension: verbalized understanding   HOME EXERCISE PROGRAM: Access Code: [email or web address] URL: [email or web address] Date: 05/22/2022 Prepared by: MoAndris BaumannExercises - Seated Hip Abduction with Resistance  - 2 x daily - 7 x weekly - 2 sets - 10 reps - Seated Hip Adduction Isometrics with Ball  - 2 x daily - 7 x weekly - 2 sets - 10 reps - Standing March with Counter Support  - 2 x daily - 7 x weekly - 2 sets - 10 reps    GOALS: Goals reviewed with patient? Yes  SHORT TERM GOALS: Target date: 07/03/22  Patient will be independent with initial HEP. Goal status: Met  2.   Patient will be educated on strategies to decrease risk of falls.  Goal status: Met   LONG TERM GOALS: Target date: 08/14/22  Patient will be independent with advanced/ongoing HEP to improve outcomes and carryover.  Goal status: INITIAL  2.  Patient will be able to ambulate 600' with LRAD with good safety to access community.  Baseline: using rollator Goal status: 36022fPC Progressing 07/08/22  07/17/22 480 feet with SPC  3.  Patient will be able to step up/down curb safely with LRAD for safety with community ambulation.  Baseline: unable to navigate curbs Goal status: INITIAL   4.  Patient will demonstrate improved functional LE strength as demonstrated by increase in 1 muscle grade in all weak groups. Goal status: All muscle groups have met goal except for quads pain prevent maximum effort 07/08/22  5.  Patient will demonstrate decreased fall risk by scoring < 15 sec on 5xSTS. Baseline: 18.99s Goal status: 13.66s Met 07/08/22  6.  Patient will demonstrate decreased fall risk by scoring < 10 sec on TUG. Baseline: 15.96s Goal status: Met 10.06 06/19/22    07/17/22 no AD 16 sec, with SPC 11.92 sec progressing ASSESSMENT:  CLINICAL IMPRESSION:  Pt enters doing well, She did well with a progressed session. Pt gives a good effort throughout session. Progressing with goals  REHAB POTENTIAL: Good  CLINICAL DECISION MAKING: Stable/uncomplicated  EVALUATION COMPLEXITY: Low  PLAN: PT FREQUENCY: 2x/week  PT DURATION: 12 weeks  PLANNED INTERVENTIONS: Therapeutic exercises, Therapeutic activity, Neuromuscular re-education, Balance training, Gait training, Patient/Family education, Self Care, Joint mobilization, Stair training, Cryotherapy, Moist heat, Manual lymph drainage, Compression bandaging, Ionotophoresis 4mg70m Dexamethasone, and Manual therapy  PLAN FOR NEXT SESSION: Work on core, functionality, balance  if possible, walking laps, calf raises  Cheri Fowler, PTA Andris Baumann, DPT 07/17/2022, 9:24 [address] [place], [place], [address] Phone: [phone]   Fax:  [phone]  Patient Details  Name: Whitney Chavez MRN: [id number] Date of Birth: 1956/08/17 Referring Provider:  Kathyrn Lass, MD  Encounter Date: 07/17/2022   Laqueta Carina, PTA 07/17/2022, 9:24 AM  [place] [place], [place], [address] Phone: [phone]   Fax:  [phone]

## 2022-07-21 NOTE — Therapy (Signed)
OUTPATIENT PHYSICAL THERAPY NEURO TREATMENT   Patient Name: Whitney Chavez MRN: [id number] DOB:05/26/1956, 66 y.o., female Today's Date: 07/22/2022   PCP: Kathyrn Lass REFERRING PROVIDER: Arlice Colt   PT End of Session - 07/22/22 1315     Visit Number 13    Date for PT Re-Evaluation 08/14/22    Authorization Type Medicare    PT Start Time 1310    PT Stop Time 1355    PT Time Calculation (min) 45 min    Activity Tolerance Patient tolerated treatment well    Behavior During Therapy [facility] for tasks assessed/performed                 Past Medical History:  Diagnosis Date   Asthma    Axillary vein thrombosis ([place]) 08/16/2015   Calciphylaxis    Chronic kidney disease    Colitis    COPD (chronic obstructive pulmonary disease) ([place])    Factor V Leiden ([place])    Hypertension    Lymphedema    legs   MRSA (methicillin resistant staph aureus) culture positive    Neuropathy    Obstructive sleep apnea 03/29/2015   OSA on CPAP    Vision abnormalities    Past Surgical History:  Procedure Laterality Date   APPLICATION OF WOUND VAC     AV FISTULA PLACEMENT Left    ESOPHAGOGASTRODUODENOSCOPY (EGD) WITH PROPOFOL Bilateral 03/19/2022   Procedure: ESOPHAGOGASTRODUODENOSCOPY (EGD) WITH PROPOFOL;  Surgeon: Arta Silence, MD;  Location[facility];  Service: Gastroenterology;  Laterality: Bilateral;   lumbar decompression fusion     TONSILLECTOMY     Patient Active Problem List   Diagnosis Date Noted   Eye redness 01/28/2021   Essential tremor 04/10/2020   Calciphylaxis 08/03/2018   Flatfoot 04/01/2018   Chronic pain syndrome 11/12/2017   Neuropathic pain 11/12/2017   COPD, moderate ([place]) 10/23/2017   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   Open abdominal wall wound 10/23/2017   Colitis 10/23/2017   Normocytic anemia 10/23/2017   Moderate major depression, single episode ([place]) 07/16/2017   Encounter for peritoneal dialysis catheter insertion ([place]) 06/18/2017   Panniculitis  04/28/2017   Long term current use of anticoagulant 01/17/2016   Factor V Leiden mutation ([place]) 01/17/2016   Axillary vein thrombosis ([place]) 08/16/2015   OSA on CPAP 03/29/2015   Factor 5 Leiden mutation, heterozygous ([place]) 03/29/2015   Insomnia 03/29/2015   Polyneuropathy 03/29/2015   Restless leg 03/29/2015   Gait disturbance 03/29/2015   Benign hypertension with end-stage renal disease ([place]) 03/01/2015   Asthma-COPD overlap syndrome 08/19/2013   Morbid (severe) obesity due to excess calories ([place]) 08/19/2013   Morbid obesity with BMI of 45.0-49.9, adult ([place]) 08/19/2013   End-stage renal disease on hemodialysis ([place]) 08/18/2013    ONSET DATE: 04/22/22  REFERRING DIAG: G62.9, R26.9  THERAPY DIAG:  Difficulty in walking, not elsewhere classified  Muscle weakness (generalized)  Unsteadiness on feet  Other abnormalities of gait and mobility  Other lack of coordination  Unspecified lack of coordination  Gait disturbance  Abnormal posture  Rationale for Evaluation and Treatment Rehabilitation  SUBJECTIVE:  SUBJECTIVE STATEMENT: I am doing well but today is a sore day. I am sore everywhere today. I think I do better in the mornings because I am not as tired.  Pt accompanied by: self  PERTINENT HISTORY: lumbar decompression fusion, COPD, CKD, neuropathy, dialysis   PAIN:  Are you having pain? Yes: NPRS scale: 1/10 Pain location: both knees  Pain description: toothachey grinding pain Aggravating factors: bending knees trying straighten knees out all the way  Relieving factors: staying off of knees   PRECAUTIONS: Fall  WEIGHT BEARING RESTRICTIONS No  FALLS: Has patient fallen in last 6 months? No  LIVING ENVIRONMENT: Lives with: lives alone Lives in: House/apartment Stairs:  No Has following equipment at home: Single point cane and Environmental consultant - 4 wheeled  PLOF: Independent  PATIENT GOALS walk better, balance better, strengthen my legs   OBJECTIVE:    COGNITION: Overall cognitive status: Within functional limits for tasks assessed   SENSATION: WFL   EDEMA:  Circumferential: patient has lymphedema in BLE and wears bandages   MUSCLE LENGTH: Hamstrings: tightness bilaterally, limited due to bandages and swelling from lymphedema   POSTURE: rounded shoulders, forward head, increased thoracic kyphosis, and flexed trunk   LOWER EXTREMITY ROM:   WFL, limited knee extension bilaterally due to calciphylaxis and lymphedema    LOWER EXTREMITY MMT:    MMT Right Eval Left Eval Right  06/19/22 Left 06/19/22 Right 07/08/22 Left  07/08/22  Hip flexion 4 4- 4+ 4 4+ 4+  Hip extension        Hip abduction _0 Hip adduction _1 Hip internal rotation        Hip external rotation        Knee flexion _2 4+ 5 5  Knee extension 3+ _3 grinding pain 3+ grinding pain  Ankle dorsiflexion 5 5      Ankle plantarflexion 5 5      Ankle inversion        Ankle eversion        (Blank rows = not tested)  TRANSFERS: Assistive device utilized: Environmental consultant - 4 wheeled  Sit to stand: Complete Independence Stand to sit: Modified independence Chair to chair: Modified independence   GAIT: Gait pattern: step to pattern, decreased step length- Right, decreased step length- Left, decreased stride length, decreased trunk rotation, trunk flexed, wide BOS, poor foot clearance- Right, and poor foot clearance- Left Distance walked: in clinic distances  Assistive device utilized: Environmental consultant - 4 wheeled Level of assistance: Complete Independence Comments: primarily using rollator to ambulate but uses SPC for short distances   FUNCTIONAL TESTs:  5 times sit to stand: 18 seconds on 06/26/22 Timed up and go (TUG): 15.96s  TODAY'S TREATMENT:  07/22/22 NuStep L5  x37mns Walking with SPC 1 lap Walking with RW 2 laps STS 4x5 Standing rows and ext greenTB 2x10 Alt box taps 6" with SPC x5 each side, then with 4"    07/17/22 Nustep L5 6 min Amb 4 laps 480 feet with SPC cuing to look up and slow down for last lap SBA Gait no AD 228fx2  CGA STS with 3# chest press 2 sets 5 4 inch step up with 4WW 5 x each leg 2 sets RT stronger than left Standing Rows red 2x10 Standing shoulder Ext red 2x10    07/10/22  NuStep L5 x 6 min Gait SPC 36036fait no AD 26f100f  Standing Rows red 2x10 Standing shoulder Ext red 2x10 Gait no AD 75 ft, LOB x1 needed HHA x1 after 12f Standing Rows red 2x10 Standing shoulder Ext red 2x10 S2S 3x5  Standing ball toss   07/07/22 NuStep L5 x 7 min GOALS Stair negotiation 4 and 6 in, heavy UE use, L knee pain when descending stairs Shoulder Ext red 2x10 ALT 6in box taps SPC 2x10 LAQ 2lb 3x10 Hip add ball squeezes   07/03/22 NuStep L5 x639ms Walking 2 laps with cane  greenTB twists for core 2x10 Shoulder ext 5# 2x10 Step ups on airex in RW  S2S on airex 4x5  Side steps along mat   07/01/22 NuStep L5 x6m75m  S2S from elevated mat 3x5 with progressive lowering of table  Modified crunches on BOSU x10, then x10 holding out red ball  Grip strength with yellow 2x10 each hand Semi tandem and feet together 10s  HS curls greenTB 2x10 LAQ no weight 2x10   Rows and ext greenTB 2x10  06/26/22 Right Grip strength 33# BMI 38.1 Rolling start 15 foot walk test 7 seconds without device STS 3x5 5# straight arm pulls 2x10 Feet on ball K2C, trunk rotation, small bridges, isometric abs Gait with rollator out the back door and around to the front of th building  PATIENT EDUCATION: Education details: POC and HEP Person educated: Patient Education method: Explanation Education comprehension: verbalized understanding   HOME EXERCISE PROGRAM: Access Code: [email or web address] URL: [email or web address] Date:  05/22/2022 Prepared by: MonAndris Baumannxercises - Seated Hip Abduction with Resistance  - 2 x daily - 7 x weekly - 2 sets - 10 reps - Seated Hip Adduction Isometrics with Ball  - 2 x daily - 7 x weekly - 2 sets - 10 reps - Standing March with Counter Support  - 2 x daily - 7 x weekly - 2 sets - 10 reps    GOALS: Goals reviewed with patient? Yes  SHORT TERM GOALS: Target date: 07/03/22  Patient will be independent with initial HEP. Goal status: Met  2.  Patient will be educated on strategies to decrease risk of falls.  Goal status: Met   LONG TERM GOALS: Target date: 08/14/22  Patient will be independent with advanced/ongoing HEP to improve outcomes and carryover.  Goal status: INITIAL  2.  Patient will be able to ambulate 600' with LRAD with good safety to access community.  Baseline: using rollator Goal status: [id number] Progressing 07/08/22  07/17/22 480 feet with SPC  3.  Patient will be able to step up/down curb safely with LRAD for safety with community ambulation.  Baseline: unable to navigate curbs Goal status: INITIAL   4.  Patient will demonstrate improved functional LE strength as demonstrated by increase in 1 muscle grade in all weak groups. Goal status: All muscle groups have met goal except for quads pain prevent maximum effort 07/08/22  5.  Patient will demonstrate decreased fall risk by scoring < 15 sec on 5xSTS. Baseline: 18.99s Goal status: 13.66s Met 07/08/22  6.  Patient will demonstrate decreased fall risk by scoring < 10 sec on TUG. Baseline: 15.96s Goal status: Met 10.06 06/19/22    07/17/22 no AD 16 sec, with SPC 11.92 sec progressing ASSESSMENT:  CLINICAL IMPRESSION:  Pt enters with increased soreness and pain in her body esp her knees. Limited in session due to pain and fatigue today. Difficulty throughout session but states she always feels better at end of session.   REHAB POTENTIAL: Good  CLINICAL DECISION MAKING:  Stable/uncomplicated  EVALUATION COMPLEXITY: Low  PLAN: PT FREQUENCY: 2x/week  PT DURATION: 12 weeks  PLANNED INTERVENTIONS: Therapeutic exercises, Therapeutic activity, Neuromuscular re-education, Balance training, Gait training, Patient/Family education, Self Care, Joint mobilization, Stair training, Cryotherapy, Moist heat, Manual lymph drainage, Compression bandaging, Ionotophoresis 30m/ml Dexamethasone, and Manual therapy  PLAN FOR NEXT SESSION: Work on core, functionality, balance if possible, walking laps, calf raises   MAndris Baumann DPT 07/22/2022, 1:53 [place] [address]Phone: [phone]  Fax:  [phone] Patient Details  Name: LDEDEE LISSMRN: [id number] of Birth: 102-17-1957Referring Provider:  MKathyrn Lass MD  Encounter Date: 07/22/2022   MAndris Baumann PT 07/22/2022, 1:53 PM  [place] [address]Phone: [phone]  Fax:  [phone]

## 2022-07-22 ENCOUNTER — Ambulatory Visit: Payer: Medicare Other

## 2022-07-22 DIAGNOSIS — R279 Unspecified lack of coordination: Secondary | ICD-10-CM

## 2022-07-22 DIAGNOSIS — M6281 Muscle weakness (generalized): Secondary | ICD-10-CM

## 2022-07-22 DIAGNOSIS — R2689 Other abnormalities of gait and mobility: Secondary | ICD-10-CM

## 2022-07-22 DIAGNOSIS — R262 Difficulty in walking, not elsewhere classified: Secondary | ICD-10-CM

## 2022-07-22 DIAGNOSIS — R278 Other lack of coordination: Secondary | ICD-10-CM

## 2022-07-22 DIAGNOSIS — R269 Unspecified abnormalities of gait and mobility: Secondary | ICD-10-CM

## 2022-07-22 DIAGNOSIS — R293 Abnormal posture: Secondary | ICD-10-CM

## 2022-07-22 DIAGNOSIS — R2681 Unsteadiness on feet: Secondary | ICD-10-CM

## 2022-07-24 ENCOUNTER — Encounter: Payer: Self-pay | Admitting: Physical Therapy

## 2022-07-24 ENCOUNTER — Ambulatory Visit: Payer: Medicare Other | Admitting: Physical Therapy

## 2022-07-24 DIAGNOSIS — R262 Difficulty in walking, not elsewhere classified: Secondary | ICD-10-CM | POA: Diagnosis not present

## 2022-07-24 DIAGNOSIS — M6281 Muscle weakness (generalized): Secondary | ICD-10-CM

## 2022-07-24 DIAGNOSIS — R2681 Unsteadiness on feet: Secondary | ICD-10-CM

## 2022-07-24 NOTE — Therapy (Signed)
OUTPATIENT PHYSICAL THERAPY NEURO TREATMENT   Patient Name: Whitney Chavez MRN: [id number] DOB:January 10, 1956, 66 y.o., female Today's Date: 07/24/2022   PCP: Kathyrn Lass REFERRING PROVIDER: Arlice Colt   PT End of Session - 07/24/22 1010     Visit Number 14    Date for PT Re-Evaluation 08/14/22    PT Start Time 1013    PT Stop Time 1100    PT Time Calculation (min) 47 min    Activity Tolerance Patient tolerated treatment well    Behavior During Therapy [facility] for tasks assessed/performed                 Past Medical History:  Diagnosis Date   Asthma    Axillary vein thrombosis ([place]) 08/16/2015   Calciphylaxis    Chronic kidney disease    Colitis    COPD (chronic obstructive pulmonary disease) ([place])    Factor V Leiden ([place])    Hypertension    Lymphedema    legs   MRSA (methicillin resistant staph aureus) culture positive    Neuropathy    Obstructive sleep apnea 03/29/2015   OSA on CPAP    Vision abnormalities    Past Surgical History:  Procedure Laterality Date   APPLICATION OF WOUND VAC     AV FISTULA PLACEMENT Left    ESOPHAGOGASTRODUODENOSCOPY (EGD) WITH PROPOFOL Bilateral 03/19/2022   Procedure: ESOPHAGOGASTRODUODENOSCOPY (EGD) WITH PROPOFOL;  Surgeon: Arta Silence, MD;  Location: [facility];  Service: Gastroenterology;  Laterality: Bilateral;   lumbar decompression fusion     TONSILLECTOMY     Patient Active Problem List   Diagnosis Date Noted   Eye redness 01/28/2021   Essential tremor 04/10/2020   Calciphylaxis 08/03/2018   Flatfoot 04/01/2018   Chronic pain syndrome 11/12/2017   Neuropathic pain 11/12/2017   COPD, moderate ([place]) 10/23/2017   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   Open abdominal wall wound 10/23/2017   Colitis 10/23/2017   Normocytic anemia 10/23/2017   Moderate major depression, single episode ([place]) 07/16/2017   Encounter for peritoneal dialysis catheter insertion ([place]) 06/18/2017   Panniculitis 04/28/2017   Long term current use  of anticoagulant 01/17/2016   Factor V Leiden mutation ([place]) 01/17/2016   Axillary vein thrombosis ([place]) 08/16/2015   OSA on CPAP 03/29/2015   Factor 5 Leiden mutation, heterozygous ([place]) 03/29/2015   Insomnia 03/29/2015   Polyneuropathy 03/29/2015   Restless leg 03/29/2015   Gait disturbance 03/29/2015   Benign hypertension with end-stage renal disease ([place]) 03/01/2015   Asthma-COPD overlap syndrome 08/19/2013   Morbid (severe) obesity due to excess calories ([place]) 08/19/2013   Morbid obesity with BMI of 45.0-49.9, adult ([place]) 08/19/2013   End-stage renal disease on hemodialysis ([place]) 08/18/2013    ONSET DATE: 04/22/22  REFERRING DIAG: G62.9, R26.9  THERAPY DIAG:  Difficulty in walking, not elsewhere classified  Muscle weakness (generalized)  Unsteadiness on feet  Rationale for Evaluation and Treatment Rehabilitation  SUBJECTIVE:  SUBJECTIVE STATEMENT: "Im good"  Pt accompanied by: self  PERTINENT HISTORY: lumbar decompression fusion, COPD, CKD, neuropathy, dialysis   PAIN:  Are you having pain? Yes: NPRS scale: 4/10 Pain location: both knees  Pain description: toothachey grinding pain Aggravating factors: bending knees trying straighten knees out all the way  Relieving factors: staying off of knees   PRECAUTIONS: Fall  WEIGHT BEARING RESTRICTIONS No  FALLS: Has patient fallen in last 6 months? No  LIVING ENVIRONMENT: Lives with: lives alone Lives in: House/apartment Stairs: No Has following equipment at home: Single point cane and Environmental consultant - 4 wheeled  PLOF: Independent  PATIENT GOALS walk better, balance better, strengthen my legs   OBJECTIVE:    COGNITION: Overall cognitive status: Within functional limits for tasks assessed   SENSATION: WFL   EDEMA:   Circumferential: patient has lymphedema in BLE and wears bandages   MUSCLE LENGTH: Hamstrings: tightness bilaterally, limited due to bandages and swelling from lymphedema   POSTURE: rounded shoulders, forward head, increased thoracic kyphosis, and flexed trunk   LOWER EXTREMITY ROM:   WFL, limited knee extension bilaterally due to calciphylaxis and lymphedema    LOWER EXTREMITY MMT:    MMT Right Eval Left Eval Right  06/19/22 Left 06/19/22 Right 07/08/22 Left  07/08/22  Hip flexion 4 4- 4+ 4 4+ 4+  Hip extension        Hip abduction _0 Hip adduction _1 Hip internal rotation        Hip external rotation        Knee flexion _2 4+ 5 5  Knee extension 3+ _3 grinding pain 3+ grinding pain  Ankle dorsiflexion 5 5      Ankle plantarflexion 5 5      Ankle inversion        Ankle eversion        (Blank rows = not tested)  TRANSFERS: Assistive device utilized: Environmental consultant - 4 wheeled  Sit to stand: Complete Independence Stand to sit: Modified independence Chair to chair: Modified independence   GAIT: Gait pattern: step to pattern, decreased step length- Right, decreased step length- Left, decreased stride length, decreased trunk rotation, trunk flexed, wide BOS, poor foot clearance- Right, and poor foot clearance- Left Distance walked: in clinic distances  Assistive device utilized: Environmental consultant - 4 wheeled Level of assistance: Complete Independence Comments: primarily using rollator to ambulate but uses SPC for short distances   FUNCTIONAL TESTs:  5 times sit to stand: 18 seconds on 06/26/22 Timed up and go (TUG): 15.96s  TODAY'S TREATMENT:  07/24/22 NuStep L5 x 7 min  Standing rows green 2x15 Standing shoulder Ext 2x12 Gait w/ SPC 216f two laps Gait w/ SPC 3640fthree laps Alt 4in box taps w/ SPC 2x10 Side step in front of mat table  Hamstring curls green 2x15   07/22/22 NuStep L5 x6m66m Walking with SPC 1 lap Walking with RW 2 laps STS  4x5 Standing rows and ext greenTB 2x10 Alt box taps 6" with SPC x5 each side, then with 4"    07/17/22 Nustep L5 6 min Amb 4 laps 480 feet with SPC cuing to look up and slow down for last lap SBA Gait no AD 36f33f  CGA STS with 3# chest press 2 sets 5 4 inch step up with 4WW 5 x each leg 2 sets RT stronger than left Standing Rows red 2x10 Standing shoulder Ext  red 2x10    07/10/22  NuStep L5 x 6 min Gait SPC 325f Gait no AD 251fx2  Standing Rows red 2x10 Standing shoulder Ext red 2x10 Gait no AD 75 ft, LOB x1 needed HHA x1 after 3059ftanding Rows red 2x10 Standing shoulder Ext red 2x10 S2S 3x5  Standing ball toss   07/07/22 NuStep L5 x 7 min GOALS Stair negotiation 4 and 6 in, heavy UE use, L knee pain when descending stairs Shoulder Ext red 2x10 ALT 6in box taps SPC 2x10 LAQ 2lb 3x10 Hip add ball squeezes   07/03/22 NuStep L5 x6mi25mWalking 2 laps with cane  greenTB twists for core 2x10 Shoulder ext 5# 2x10 Step ups on airex in RW  S2S on airex 4x5  Side steps along mat   07/01/22 NuStep L5 x6min67mS2S from elevated mat 3x5 with progressive lowering of table  Modified crunches on BOSU x10, then x10 holding out red ball  Grip strength with yellow 2x10 each hand Semi tandem and feet together 10s  HS curls greenTB 2x10 LAQ no weight 2x10   Rows and ext greenTB 2x10  06/26/22 Right Grip strength 33# BMI 38.1 Rolling start 15 foot walk test 7 seconds without device STS 3x5 5# straight arm pulls 2x10 Feet on ball K2C, trunk rotation, small bridges, isometric abs Gait with rollator out the back door and around to the front of th building  PATIENT EDUCATION: Education details: POC and HEP Person educated: Patient Education method: Explanation Education comprehension: verbalized understanding   HOME EXERCISE PROGRAM: Access Code: [email or web address] URL: [email or web address] Date: 05/22/2022 Prepared by: Mona Andris Baumannrcises - Seated  Hip Abduction with Resistance  - 2 x daily - 7 x weekly - 2 sets - 10 reps - Seated Hip Adduction Isometrics with Ball  - 2 x daily - 7 x weekly - 2 sets - 10 reps - Standing March with Counter Support  - 2 x daily - 7 x weekly - 2 sets - 10 reps    GOALS: Goals reviewed with patient? Yes  SHORT TERM GOALS: Target date: 07/03/22  Patient will be independent with initial HEP. Goal status: Met  2.  Patient will be educated on strategies to decrease risk of falls.  Goal status: Met   LONG TERM GOALS: Target date: 08/14/22  Patient will be independent with advanced/ongoing HEP to improve outcomes and carryover.  Goal status: INITIAL  2.  Patient will be able to ambulate 600' with LRAD with good safety to access community.  Baseline: using rollator Goal status: 360ft 33fProgressing 07/08/22  07/17/22 480 feet with SPC  3.  Patient will be able to step up/down curb safely with LRAD for safety with community ambulation.  Baseline: unable to navigate curbs Goal status: INITIAL   4.  Patient will demonstrate improved functional LE strength as demonstrated by increase in 1 muscle grade in all weak groups. Goal status: All muscle groups have met goal except for quads pain prevent maximum effort 07/08/22  5.  Patient will demonstrate decreased fall risk by scoring < 15 sec on 5xSTS. Baseline: 18.99s Goal status: 13.66s Met 07/08/22  6.  Patient will demonstrate decreased fall risk by scoring < 10 sec on TUG. Baseline: 15.96s Goal status: Met 10.06 06/19/22    07/17/22 no AD 16 sec, with SPC 11.92 sec progressing ASSESSMENT:  CLINICAL IMPRESSION:  Pt enters with increased soreness from knees down. Some increase fatigue present with gait. CGA neededa t times to comletAffiliated Computer Services  alt box taps. Cues needed to bend knee with side steps. Difficulty throughout session but states she always feels better at end of session.   REHAB POTENTIAL: Good  CLINICAL DECISION MAKING:  Stable/uncomplicated  EVALUATION COMPLEXITY: Low  PLAN: PT FREQUENCY: 2x/week  PT DURATION: 12 weeks  PLANNED INTERVENTIONS: Therapeutic exercises, Therapeutic activity, Neuromuscular re-education, Balance training, Gait training, Patient/Family education, Self Care, Joint mobilization, Stair training, Cryotherapy, Moist heat, Manual lymph drainage, Compression bandaging, Ionotophoresis 61m/ml Dexamethasone, and Manual therapy  PLAN FOR NEXT SESSION: Work on core, functionality, balance if possible, walking laps, calf raises    RScot Jun PTA 07/24/2022, 10:10 AM

## 2022-07-29 ENCOUNTER — Ambulatory Visit: Payer: Medicare Other | Admitting: Physical Therapy

## 2022-07-29 DIAGNOSIS — R262 Difficulty in walking, not elsewhere classified: Secondary | ICD-10-CM | POA: Diagnosis not present

## 2022-07-29 DIAGNOSIS — R2681 Unsteadiness on feet: Secondary | ICD-10-CM

## 2022-07-29 DIAGNOSIS — M6281 Muscle weakness (generalized): Secondary | ICD-10-CM

## 2022-07-29 NOTE — Therapy (Signed)
OUTPATIENT PHYSICAL THERAPY NEURO TREATMENT   Patient Name: Whitney Chavez MRN: [id number] DOB:1955/12/07, 66 y.o., female Today's Date: 07/29/2022   PCP: Kathyrn Lass REFERRING PROVIDER: Arlice Colt   PT End of Session - 07/29/22 1056     Visit Number 15    Date for PT Re-Evaluation 08/14/22    Authorization Type Medicare    PT Start Time 1051    PT Stop Time 1140    PT Time Calculation (min) 49 min                 Past Medical History:  Diagnosis Date   Asthma    Axillary vein thrombosis ([place]) 08/16/2015   Calciphylaxis    Chronic kidney disease    Colitis    COPD (chronic obstructive pulmonary disease) ([place])    Factor V Leiden ([place])    Hypertension    Lymphedema    legs   MRSA (methicillin resistant staph aureus) culture positive    Neuropathy    Obstructive sleep apnea 03/29/2015   OSA on CPAP    Vision abnormalities    Past Surgical History:  Procedure Laterality Date   APPLICATION OF WOUND VAC     AV FISTULA PLACEMENT Left    ESOPHAGOGASTRODUODENOSCOPY (EGD) WITH PROPOFOL Bilateral 03/19/2022   Procedure: ESOPHAGOGASTRODUODENOSCOPY (EGD) WITH PROPOFOL;  Surgeon: Arta Silence, MD;  [facility];  Service: Gastroenterology;  Laterality: Bilateral;   lumbar decompression fusion     TONSILLECTOMY     Patient Active Problem List   Diagnosis Date Noted   Eye redness 01/28/2021   Essential tremor 04/10/2020   Calciphylaxis 08/03/2018   Flatfoot 04/01/2018   Chronic pain syndrome 11/12/2017   Neuropathic pain 11/12/2017   COPD, moderate ([place]) 10/23/2017   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   Open abdominal wall wound 10/23/2017   Colitis 10/23/2017   Normocytic anemia 10/23/2017   Moderate major depression, single episode ([place]) 07/16/2017   Encounter for peritoneal dialysis catheter insertion ([place]) 06/18/2017   Panniculitis 04/28/2017   Long term current use of anticoagulant 01/17/2016   Factor V Leiden mutation ([place]) 01/17/2016   Axillary  vein thrombosis ([place]) 08/16/2015   OSA on CPAP 03/29/2015   Factor 5 Leiden mutation, heterozygous ([place]) 03/29/2015   Insomnia 03/29/2015   Polyneuropathy 03/29/2015   Restless leg 03/29/2015   Gait disturbance 03/29/2015   Benign hypertension with end-stage renal disease ([place]) 03/01/2015   Asthma-COPD overlap syndrome 08/19/2013   Morbid (severe) obesity due to excess calories ([place]) 08/19/2013   Morbid obesity with BMI of 45.0-49.9, adult ([place]) 08/19/2013   End-stage renal disease on hemodialysis ([place]) 08/18/2013    ONSET DATE: 04/22/22  REFERRING DIAG: G62.9, R26.9  THERAPY DIAG:  Difficulty in walking, not elsewhere classified  Muscle weakness (generalized)  Unsteadiness on feet  Rationale for Evaluation and Treatment Rehabilitation  SUBJECTIVE:  SUBJECTIVE STATEMENT: Knees about the same. Hurt RT wrist 7/10 and swollen Declined from [facility] for transplant-when I am ready I will try [facility] or [facility]- want/need  to get stronger  Pt accompanied by: self  PERTINENT HISTORY: lumbar decompression fusion, COPD, CKD, neuropathy, dialysis   PAIN:  Are you having pain? Yes: NPRS scale: 3-4/10 Pain location: both knees  Pain description: toothachey grinding pain Aggravating factors: bending knees trying straighten knees out all the way  Relieving factors: staying off of knees   PRECAUTIONS: Fall  WEIGHT BEARING RESTRICTIONS No  FALLS: Has patient fallen in last 6 months? No  LIVING ENVIRONMENT: Lives with: lives alone Lives in: House/apartment Stairs: No Has following equipment at home: Single point cane and Environmental consultant - 4 wheeled  PLOF: Independent  PATIENT GOALS walk better, balance better, strengthen my legs   OBJECTIVE:    COGNITION: Overall cognitive status: Within functional limits  for tasks assessed   SENSATION: WFL   EDEMA:  Circumferential: patient has lymphedema in BLE and wears bandages   MUSCLE LENGTH: Hamstrings: tightness bilaterally, limited due to bandages and swelling from lymphedema   POSTURE: rounded shoulders, forward head, increased thoracic kyphosis, and flexed trunk   LOWER EXTREMITY ROM:   WFL, limited knee extension bilaterally due to calciphylaxis and lymphedema    LOWER EXTREMITY MMT:    MMT Right Eval Left Eval Right  06/19/22 Left 06/19/22 Right 07/08/22 Left  07/08/22  Hip flexion 4 4- 4+ 4 4+ 4+  Hip extension        Hip abduction _0 Hip adduction _1 Hip internal rotation        Hip external rotation        Knee flexion _2 4+ 5 5  Knee extension 3+ _3 grinding pain 3+ grinding pain  Ankle dorsiflexion 5 5      Ankle plantarflexion 5 5      Ankle inversion        Ankle eversion        (Blank rows = not tested)  TRANSFERS: Assistive device utilized: Environmental consultant - 4 wheeled  Sit to stand: Complete Independence Stand to sit: Modified independence Chair to chair: Modified independence   GAIT: Gait pattern: step to pattern, decreased step length- Right, decreased step length- Left, decreased stride length, decreased trunk rotation, trunk flexed, wide BOS, poor foot clearance- Right, and poor foot clearance- Left Distance walked: in clinic distances  Assistive device utilized: Environmental consultant - 4 wheeled Level of assistance: Complete Independence Comments: primarily using rollator to ambulate but uses SPC for short distances   FUNCTIONAL TESTs:  5 times sit to stand: 18 seconds on 06/26/22 Timed up and go (TUG): 15.96s  TODAY'S TREATMENT:   07/29/22 Nustep L 5 7 min Gait w/ SPC 393f three laps Seated rest Gait w/ SPC 3657fthree laps STS without UE 2 sets 5 3# LAQ 2 sets 10 HHA standing 3#  hip flex,ext and abd alt 20 x  ( tried marching but too much pain) Standing rows green 2x15 Standing  shoulder Ext 2x12     07/24/22 NuStep L5 x 7 min  Standing rows green 2x15 Standing shoulder Ext 2x12 Gait w/ SPC 24054fwo laps Gait w/ SPC 360f1free laps Alt 4in box taps w/ SPC 2x10 Side step in front of mat table  Hamstring curls green 2x15   07/22/22 NuStep L5 x6min23malking with SPC 1  lap Walking with RW 2 laps STS 4x5 Standing rows and ext greenTB 2x10 Alt box taps 6" with SPC x5 each side, then with 4"    07/17/22 Nustep L5 6 min Amb 4 laps 480 feet with SPC cuing to look up and slow down for last lap SBA Gait no AD 50f x2  CGA STS with 3# chest press 2 sets 5 4 inch step up with 4WW 5 x each leg 2 sets RT stronger than left Standing Rows red 2x10 Standing shoulder Ext red 2x10    07/10/22  NuStep L5 x 6 min Gait SPC 3646fGait no AD 2553f2  Standing Rows red 2x10 Standing shoulder Ext red 2x10 Gait no AD 75 ft, LOB x1 needed HHA x1 after 87f60fanding Rows red 2x10 Standing shoulder Ext red 2x10 S2S 3x5  Standing ball toss   07/07/22 NuStep L5 x 7 min GOALS Stair negotiation 4 and 6 in, heavy UE use, L knee pain when descending stairs Shoulder Ext red 2x10 ALT 6in box taps SPC 2x10 LAQ 2lb 3x10 Hip add ball squeezes   07/03/22 NuStep L5 x6min53malking 2 laps with cane  greenTB twists for core 2x10 Shoulder ext 5# 2x10 Step ups on airex in RW  S2S on airex 4x5  Side steps along mat   07/01/22 NuStep L5 x6mins108m2S from elevated mat 3x5 with progressive lowering of table  Modified crunches on BOSU x10, then x10 holding out red ball  Grip strength with yellow 2x10 each hand Semi tandem and feet together 10s  HS curls greenTB 2x10 LAQ no weight 2x10   Rows and ext greenTB 2x10  06/26/22 Right Grip strength 33# BMI 38.1 Rolling start 15 foot walk test 7 seconds without device STS 3x5 5# straight arm pulls 2x10 Feet on ball K2C, trunk rotation, small bridges, isometric abs Gait with rollator out the back door and around to the  front of th building  PATIENT EDUCATION: Education details: POC and HEP Person educated: Patient Education method: Explanation Education comprehension: verbalized understanding   HOME EXERCISE PROGRAM: Access Code: [email or web address] URL: [email or web address] Date: 05/22/2022 Prepared by: Mona SAndris Baumanncises - Seated Hip Abduction with Resistance  - 2 x daily - 7 x weekly - 2 sets - 10 reps - Seated Hip Adduction Isometrics with Ball  - 2 x daily - 7 x weekly - 2 sets - 10 reps - Standing March with Counter Support  - 2 x daily - 7 x weekly - 2 sets - 10 reps    GOALS: Goals reviewed with patient? Yes  SHORT TERM GOALS: Target date: 07/03/22  Patient will be independent with initial HEP. Goal status: Met  2.  Patient will be educated on strategies to decrease risk of falls.  Goal status: Met   LONG TERM GOALS: Target date: 08/14/22  Patient will be independent with advanced/ongoing HEP to improve outcomes and carryover.  Goal status: INITIAL  2.  Patient will be able to ambulate 600' with LRAD with good safety to access community.  Baseline: using rollator Goal status: 360ft S29frogressing 07/08/22  07/17/22 480 feet with SPC  3.  Patient will be able to step up/down curb safely with LRAD for safety with community ambulation.  Baseline: unable to navigate curbs Goal status: INITIAL   4.  Patient will demonstrate improved functional LE strength as demonstrated by increase in 1 muscle grade in all weak groups. Goal status: All muscle groups have met goal except for  quads pain prevent maximum effort 07/08/22  5.  Patient will demonstrate decreased fall risk by scoring < 15 sec on 5xSTS. Baseline: 18.99s Goal status: 13.66s Met 07/08/22  6.  Patient will demonstrate decreased fall risk by scoring < 10 sec on TUG. Baseline: 15.96s Goal status: Met 10.06 06/19/22    07/17/22 no AD 16 sec, with SPC 11.92 sec progressing ASSESSMENT:  CLINICAL IMPRESSION: pt  able to to do 3 laps 2 x at good pace and carry on conversation Progressed standing ex  REHAB POTENTIAL: Good  CLINICAL DECISION MAKING: Stable/uncomplicated  EVALUATION COMPLEXITY: Low  PLAN: PT FREQUENCY: 2x/week  PT DURATION: 12 weeks  PLANNED INTERVENTIONS: Therapeutic exercises, Therapeutic activity, Neuromuscular re-education, Balance training, Gait training, Patient/Family education, Self Care, Joint mobilization, Stair training, Cryotherapy, Moist heat, Manual lymph drainage, Compression bandaging, Ionotophoresis 69m/ml Dexamethasone, and Manual therapy  PLAN FOR NEXT SESSION: assess goals    Leslieann Whisman,ANGIE, PTA 07/29/2022, 10:56 [place] [address]Phone: [phone]  Fax:  [phone] Patient Details  Name: LLATARSHA ZANIMRN: [id number] of Birth: [birth year]Referring Provider:  MKathyrn Lass MD  Encounter Date: 07/29/2022   PLaqueta Carina PTA 07/29/2022, 10:56 AM  [place] [address]Phone: [phone]  Fax:  [phone]

## 2022-07-31 ENCOUNTER — Encounter: Payer: Self-pay | Admitting: Physical Therapy

## 2022-07-31 ENCOUNTER — Ambulatory Visit: Payer: Medicare Other | Admitting: Physical Therapy

## 2022-07-31 DIAGNOSIS — R262 Difficulty in walking, not elsewhere classified: Secondary | ICD-10-CM | POA: Diagnosis not present

## 2022-07-31 DIAGNOSIS — R2681 Unsteadiness on feet: Secondary | ICD-10-CM

## 2022-07-31 DIAGNOSIS — M6281 Muscle weakness (generalized): Secondary | ICD-10-CM

## 2022-07-31 NOTE — Therapy (Signed)
OUTPATIENT PHYSICAL THERAPY NEURO TREATMENT   Patient Name: Whitney Chavez MRN: [id number] DOB:1955-12-12, 66 y.o., female Today's Date: 07/31/2022   PCP: Kathyrn Lass REFERRING PROVIDER: Arlice Colt   PT End of Session - 07/31/22 1056     Visit Number 16    Date for PT Re-Evaluation 08/14/22    PT Start Time 1100    PT Stop Time 1145    PT Time Calculation (min) 45 min    Activity Tolerance Patient tolerated treatment well    Behavior During Therapy [facility] for tasks assessed/performed                 Past Medical History:  Diagnosis Date   Asthma    Axillary vein thrombosis ([place]) 08/16/2015   Calciphylaxis    Chronic kidney disease    Colitis    COPD (chronic obstructive pulmonary disease) ([place])    Factor V Leiden ([place])    Hypertension    Lymphedema    legs   MRSA (methicillin resistant staph aureus) culture positive    Neuropathy    Obstructive sleep apnea 03/29/2015   OSA on CPAP    Vision abnormalities    Past Surgical History:  Procedure Laterality Date   APPLICATION OF WOUND VAC     AV FISTULA PLACEMENT Left    ESOPHAGOGASTRODUODENOSCOPY (EGD) WITH PROPOFOL Bilateral 03/19/2022   Procedure: ESOPHAGOGASTRODUODENOSCOPY (EGD) WITH PROPOFOL;  Surgeon: Arta Silence, MD;  Location: [facility];  Service: Gastroenterology;  Laterality: Bilateral;   lumbar decompression fusion     TONSILLECTOMY     Patient Active Problem List   Diagnosis Date Noted   Eye redness 01/28/2021   Essential tremor 04/10/2020   Calciphylaxis 08/03/2018   Flatfoot 04/01/2018   Chronic pain syndrome 11/12/2017   Neuropathic pain 11/12/2017   COPD, moderate ([place]) 10/23/2017   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   Open abdominal wall wound 10/23/2017   Colitis 10/23/2017   Normocytic anemia 10/23/2017   Moderate major depression, single episode ([place]) 07/16/2017   Encounter for peritoneal dialysis catheter insertion ([place]) 06/18/2017   Panniculitis 04/28/2017   Long term current use  of anticoagulant 01/17/2016   Factor V Leiden mutation ([place]) 01/17/2016   Axillary vein thrombosis ([place]) 08/16/2015   OSA on CPAP 03/29/2015   Factor 5 Leiden mutation, heterozygous ([place]) 03/29/2015   Insomnia 03/29/2015   Polyneuropathy 03/29/2015   Restless leg 03/29/2015   Gait disturbance 03/29/2015   Benign hypertension with end-stage renal disease ([place]) 03/01/2015   Asthma-COPD overlap syndrome 08/19/2013   Morbid (severe) obesity due to excess calories ([place]) 08/19/2013   Morbid obesity with BMI of 45.0-49.9, adult ([place]) 08/19/2013   End-stage renal disease on hemodialysis ([place]) 08/18/2013    ONSET DATE: 04/22/22  REFERRING DIAG: G62.9, R26.9  THERAPY DIAG:  Difficulty in walking, not elsewhere classified  Muscle weakness (generalized)  Unsteadiness on feet  Rationale for Evaluation and Treatment Rehabilitation  SUBJECTIVE:  SUBJECTIVE STATEMENT: Sore today in the R wrist. Not sure what happen, Can't twist R wrist, Knees and low back is also hurting.   Pt accompanied by: self  PERTINENT HISTORY: lumbar decompression fusion, COPD, CKD, neuropathy, dialysis   PAIN:  Are you having pain? Yes: NPRS scale: 6/10 Pain location: knees, wrist, low back Pain description: toothachey grinding pain Aggravating factors: bending knees trying straighten knees out all the way  Relieving factors: staying off of knees   PRECAUTIONS: Fall  WEIGHT BEARING RESTRICTIONS No  FALLS: Has patient fallen in last 6 months? No  LIVING ENVIRONMENT: Lives with: lives alone Lives in: House/apartment Stairs: No Has following equipment at home: Single point cane and Environmental consultant - 4 wheeled  PLOF: Independent  PATIENT GOALS walk better, balance better, strengthen my legs   OBJECTIVE:     COGNITION: Overall cognitive status: Within functional limits for tasks assessed   SENSATION: WFL   EDEMA:  Circumferential: patient has lymphedema in BLE and wears bandages   MUSCLE LENGTH: Hamstrings: tightness bilaterally, limited due to bandages and swelling from lymphedema   POSTURE: rounded shoulders, forward head, increased thoracic kyphosis, and flexed trunk   LOWER EXTREMITY ROM:   WFL, limited knee extension bilaterally due to calciphylaxis and lymphedema    LOWER EXTREMITY MMT:    MMT Right Eval Left Eval Right  06/19/22 Left 06/19/22 Right 07/08/22 Left  07/08/22  Hip flexion 4 4- 4+ 4 4+ 4+  Hip extension        Hip abduction _0 Hip adduction _1 Hip internal rotation        Hip external rotation        Knee flexion _2 4+ 5 5  Knee extension 3+ _3 grinding pain 3+ grinding pain  Ankle dorsiflexion 5 5      Ankle plantarflexion 5 5      Ankle inversion        Ankle eversion        (Blank rows = not tested)  TRANSFERS: Assistive device utilized: Environmental consultant - 4 wheeled  Sit to stand: Complete Independence Stand to sit: Modified independence Chair to chair: Modified independence   GAIT: Gait pattern: step to pattern, decreased step length- Right, decreased step length- Left, decreased stride length, decreased trunk rotation, trunk flexed, wide BOS, poor foot clearance- Right, and poor foot clearance- Left Distance walked: in clinic distances  Assistive device utilized: Environmental consultant - 4 wheeled Level of assistance: Complete Independence Comments: primarily using rollator to ambulate but uses SPC for short distances   FUNCTIONAL TESTs:  5 times sit to stand: 18 seconds on 06/26/22 Timed up and go (TUG): 15.96s  TODAY'S TREATMENT:  07/31/22 NuStep L5 x 7 min Attempted to ambulate with SPC but unable to due to increase pain. Gait with rollator 120 ft S2S 3x5  Standing rows green 2x15 Standing shoulder Ext 2x12 Gait w/ SPC  120 feet one lap Gait w/ rollator 232f Seated crunches  07/29/22 Nustep L 5 7 min Gait w/ SPC 3669fthree laps Seated rest Gait w/ SPC 36025fhree laps STS without UE 2 sets 5 3# LAQ 2 sets 10 HHA standing 3#  hip flex,ext and abd alt 20 x  ( tried marching but too much pain) Standing rows green 2x15 Standing shoulder Ext 2x12     07/24/22 NuStep L5 x 7 min  Standing rows green 2x15 Standing shoulder Ext 2x12  Gait w/ SPC 228f two laps Gait w/ SPC 3684fthree laps Alt 4in box taps w/ SPC 2x10 Side step in front of mat table  Hamstring curls green 2x15   07/22/22 NuStep L5 x6m23m Walking with SPC 1 lap Walking with RW 2 laps STS 4x5 Standing rows and ext greenTB 2x10 Alt box taps 6" with SPC x5 each side, then with 4"    07/17/22 Nustep L5 6 min Amb 4 laps 480 feet with SPC cuing to look up and slow down for last lap SBA Gait no AD 30f69f  CGA STS with 3# chest press 2 sets 5 4 inch step up with 4WW 5 x each leg 2 sets RT stronger than left Standing Rows red 2x10 Standing shoulder Ext red 2x10    07/10/22  NuStep L5 x 6 min Gait SPC 360ft31ft no AD 30ft 76fStanding Rows red 2x10 Standing shoulder Ext red 2x10 Gait no AD 75 ft, LOB x1 needed HHA x1 after 30ft S11fing Rows red 2x10 Standing shoulder Ext red 2x10 S2S 3x5  Standing ball toss   07/07/22 NuStep L5 x 7 min GOALS Stair negotiation 4 and 6 in, heavy UE use, L knee pain when descending stairs Shoulder Ext red 2x10 ALT 6in box taps SPC 2x10 LAQ 2lb 3x10 Hip add ball squeezes   07/03/22 NuStep L5 x6mins W47ming 2 laps with cane  greenTB twists for core 2x10 Shoulder ext 5# 2x10 Step ups on airex in RW  S2S on airex 4x5  Side steps along mat   07/01/22 NuStep L5 x6mins  S97mfrom elevated mat 3x5 with progressive lowering of table  Modified crunches on BOSU x10, then x10 holding out red ball  Grip strength with yellow 2x10 each hand Semi tandem and feet together 10s  HS curls  greenTB 2x10 LAQ no weight 2x10   Rows and ext greenTB 2x10  06/26/22 Right Grip strength 33# BMI 38.1 Rolling start 15 foot walk test 7 seconds without device STS 3x5 5# straight arm pulls 2x10 Feet on ball K2C, trunk rotation, small bridges, isometric abs Gait with rollator out the back door and around to the front of th building  PATIENT EDUCATION: Education details: POC and HEP Person educated: Patient Education method: Explanation Education comprehension: verbalized understanding   HOME EXERCISE PROGRAM: Access Code: [email or web address] URL: [email or web address] Date: 05/22/2022 Prepared by: Mona SajjAndris Baumannes - Seated Hip Abduction with Resistance  - 2 x daily - 7 x weekly - 2 sets - 10 reps - Seated Hip Adduction Isometrics with Ball  - 2 x daily - 7 x weekly - 2 sets - 10 reps - Standing March with Counter Support  - 2 x daily - 7 x weekly - 2 sets - 10 reps    GOALS: Goals reviewed with patient? Yes  SHORT TERM GOALS: Target date: 07/03/22  Patient will be independent with initial HEP. Goal status: Met  2.  Patient will be educated on strategies to decrease risk of falls.  Goal status: Met   LONG TERM GOALS: Target date: 08/14/22  Patient will be independent with advanced/ongoing HEP to improve outcomes and carryover.  Goal status: INITIAL  2.  Patient will be able to ambulate 600' with LRAD with good safety to access community.  Baseline: using rollator Goal status: 360ft SPC 75fressing 07/08/22  07/17/22 480 feet with SPC  3.  Patient will be able to step up/down curb safely with LRAD for safety with community ambulation.  Baseline: unable to navigate curbs Goal status: INITIAL   4.  Patient will demonstrate improved functional LE strength as demonstrated by increase in 1 muscle grade in all weak groups. Goal status: All muscle groups have met goal except for quads pain prevent maximum effort 07/08/22  5.  Patient will demonstrate  decreased fall risk by scoring < 15 sec on 5xSTS. Baseline: 18.99s Goal status: 13.66s Met 07/08/22  6.  Patient will demonstrate decreased fall risk by scoring < 10 sec on TUG. Baseline: 15.96s Goal status: Met 10.06 06/19/22    07/17/22 no AD 16 sec, with SPC 11.92 sec progressing ASSESSMENT:  CLINICAL IMPRESSION: Pt enters with reports of pain. She was unable to complete her gait trials that's she did last session. Postural cue needed with standing rows and extensions. Increase fatigue with activity today.  REHAB POTENTIAL: Good  CLINICAL DECISION MAKING: Stable/uncomplicated  EVALUATION COMPLEXITY: Low  PLAN: PT FREQUENCY: 2x/week  PT DURATION: 12 weeks  PLANNED INTERVENTIONS: Therapeutic exercises, Therapeutic activity, Neuromuscular re-education, Balance training, Gait training, Patient/Family education, Self Care, Joint mobilization, Stair training, Cryotherapy, Moist heat, Manual lymph drainage, Compression bandaging, Ionotophoresis 50m/ml Dexamethasone, and Manual therapy  PLAN FOR NEXT SESSION: assess goals    RScot Jun PTA 07/31/2022, 10:56 AAlston GGaylord [place] [address]Phone: [phone]  Fax:  [phone] Patient Details  Name: LCARRINE KROBOTHMRN: [id number] of Birth: 109-Feb-1957Referring Provider:  MKathyrn Lass MD  Encounter Date: 07/31/2022   RScot Jun PTA 07/31/2022, 10:56 AM  [place] [address]Phone: [phone]  Fax:  [phone]

## 2022-08-05 ENCOUNTER — Ambulatory Visit: Payer: Medicare Other

## 2022-08-12 ENCOUNTER — Ambulatory Visit: Payer: Medicare Other | Admitting: Physical Therapy

## 2022-08-12 DIAGNOSIS — M6281 Muscle weakness (generalized): Secondary | ICD-10-CM

## 2022-08-12 DIAGNOSIS — R2681 Unsteadiness on feet: Secondary | ICD-10-CM

## 2022-08-12 DIAGNOSIS — R262 Difficulty in walking, not elsewhere classified: Secondary | ICD-10-CM

## 2022-08-12 NOTE — Therapy (Signed)
OUTPATIENT PHYSICAL THERAPY NEURO TREATMENT   Patient Name: Whitney Chavez MRN: [id number] DOB:May 02, 1956, 66 y.o., female Today's Date: 08/12/2022   PCP: Kathyrn Lass REFERRING PROVIDER: Arlice Colt   PT End of Session - 08/12/22 1047     Visit Number 17    Date for PT Re-Evaluation 08/14/22    Authorization Type Medicare    PT Start Time 1050    PT Stop Time 1142    PT Time Calculation (min) 52 min                 Past Medical History:  Diagnosis Date   Asthma    Axillary vein thrombosis ([place]) 08/16/2015   Calciphylaxis    Chronic kidney disease    Colitis    COPD (chronic obstructive pulmonary disease) ([place])    Factor V Leiden ([place])    Hypertension    Lymphedema    legs   MRSA (methicillin resistant staph aureus) culture positive    Neuropathy    Obstructive sleep apnea 03/29/2015   OSA on CPAP    Vision abnormalities    Past Surgical History:  Procedure Laterality Date   APPLICATION OF WOUND VAC     AV FISTULA PLACEMENT Left    ESOPHAGOGASTRODUODENOSCOPY (EGD) WITH PROPOFOL Bilateral 03/19/2022   Procedure: ESOPHAGOGASTRODUODENOSCOPY (EGD) WITH PROPOFOL;  Surgeon: Arta Silence, MD;  [facility];  Service: Gastroenterology;  Laterality: Bilateral;   lumbar decompression fusion     TONSILLECTOMY     Patient Active Problem List   Diagnosis Date Noted   Eye redness 01/28/2021   Essential tremor 04/10/2020   Calciphylaxis 08/03/2018   Flatfoot 04/01/2018   Chronic pain syndrome 11/12/2017   Neuropathic pain 11/12/2017   COPD, moderate ([place]) 10/23/2017   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   Open abdominal wall wound 10/23/2017   Colitis 10/23/2017   Normocytic anemia 10/23/2017   Moderate major depression, single episode ([place]) 07/16/2017   Encounter for peritoneal dialysis catheter insertion ([place]) 06/18/2017   Panniculitis 04/28/2017   Long term current use of anticoagulant 01/17/2016   Factor V Leiden mutation ([place]) 01/17/2016   Axillary  vein thrombosis ([place]) 08/16/2015   OSA on CPAP 03/29/2015   Factor 5 Leiden mutation, heterozygous ([place]) 03/29/2015   Insomnia 03/29/2015   Polyneuropathy 03/29/2015   Restless leg 03/29/2015   Gait disturbance 03/29/2015   Benign hypertension with end-stage renal disease ([place]) 03/01/2015   Asthma-COPD overlap syndrome 08/19/2013   Morbid (severe) obesity due to excess calories ([place]) 08/19/2013   Morbid obesity with BMI of 45.0-49.9, adult ([place]) 08/19/2013   End-stage renal disease on hemodialysis ([place]) 08/18/2013    ONSET DATE: 04/22/22  REFERRING DIAG: G62.9, R26.9  THERAPY DIAG:  Difficulty in walking, not elsewhere classified  Muscle weakness (generalized)  Unsteadiness on feet  Rationale for Evaluation and Treatment Rehabilitation  SUBJECTIVE:  SUBJECTIVE STATEMENT: wrist better but not 100%-twisting is hard. Doing okay, trying to work at home since no PT openings.   Pt accompanied by: self  PERTINENT HISTORY: lumbar decompression fusion, COPD, CKD, neuropathy, dialysis   PAIN:  Are you having pain? Yes: NPRS scale: 3/10 Pain location: knees, wrist, low back Pain description: toothachey grinding pain Aggravating factors: bending knees trying straighten knees out all the way  Relieving factors: staying off of knees   PRECAUTIONS: Fall  WEIGHT BEARING RESTRICTIONS No  FALLS: Has patient fallen in last 6 months? No  LIVING ENVIRONMENT: Lives with: lives alone Lives in: House/apartment Stairs: No Has following equipment at home: Single point cane and Environmental consultant - 4 wheeled  PLOF: Independent  PATIENT GOALS walk better, balance better, strengthen my legs   OBJECTIVE:    COGNITION: Overall cognitive status: Within functional limits for tasks  assessed   SENSATION: WFL   EDEMA:  Circumferential: patient has lymphedema in BLE and wears bandages   MUSCLE LENGTH: Hamstrings: tightness bilaterally, limited due to bandages and swelling from lymphedema   POSTURE: rounded shoulders, forward head, increased thoracic kyphosis, and flexed trunk   LOWER EXTREMITY ROM:   WFL, limited knee extension bilaterally due to calciphylaxis and lymphedema    LOWER EXTREMITY MMT:    MMT Right Eval Left Eval Right  06/19/22 Left 06/19/22 Right 07/08/22 Left  07/08/22  Hip flexion 4 4- 4+ 4 4+ 4+  Hip extension        Hip abduction _0 Hip adduction _1 Hip internal rotation        Hip external rotation        Knee flexion _2 4+ 5 5  Knee extension 3+ _3 grinding pain 3+ grinding pain  Ankle dorsiflexion 5 5      Ankle plantarflexion 5 5      Ankle inversion        Ankle eversion        (Blank rows = not tested)  TRANSFERS: Assistive device utilized: Environmental consultant - 4 wheeled  Sit to stand: Complete Independence Stand to sit: Modified independence Chair to chair: Modified independence   GAIT: Gait pattern: step to pattern, decreased step length- Right, decreased step length- Left, decreased stride length, decreased trunk rotation, trunk flexed, wide BOS, poor foot clearance- Right, and poor foot clearance- Left Distance walked: in clinic distances  Assistive device utilized: Environmental consultant - 4 wheeled Level of assistance: Complete Independence Comments: primarily using rollator to ambulate but uses SPC for short distances   FUNCTIONAL TESTs:  5 times sit to stand: 18 seconds on 06/26/22 Timed up and go (TUG): 15.96s  TODAY'S TREATMENT:   08/12/22 L 6 7 min 4# LAQ 2 sets 10 Blue tband HS curl 2 sets 10 Hip flex into abd 4# 2 sets 10 3 laps 350 feet SPC SBA 2 x STS with wt ball press 10 x Seated crunches 15 x Wt ball trunk rotation 10 x each   07/31/22 NuStep L5 x 7 min Attempted to ambulate with SPC  but unable to due to increase pain. Gait with rollator 120 ft S2S 3x5  Standing rows green 2x15 Standing shoulder Ext 2x12 Gait w/ SPC 120 feet one lap Gait w/ rollator 247f Seated crunches  07/29/22 Nustep L 5 7 min Gait w/ SPC 3625fthree laps Seated rest Gait w/ SPC 36075fhree laps STS without UE 2 sets 5  3# LAQ 2 sets 10 HHA standing 3#  hip flex,ext and abd alt 20 x  ( tried marching but too much pain) Standing rows green 2x15 Standing shoulder Ext 2x12     07/24/22 NuStep L5 x 7 min  Standing rows green 2x15 Standing shoulder Ext 2x12 Gait w/ SPC 240f two laps Gait w/ SPC 3672fthree laps Alt 4in box taps w/ SPC 2x10 Side step in front of mat table  Hamstring curls green 2x15   07/22/22 NuStep L5 x6m14m Walking with SPC 1 lap Walking with RW 2 laps STS 4x5 Standing rows and ext greenTB 2x10 Alt box taps 6" with SPC x5 each side, then with 4"    07/17/22 Nustep L5 6 min Amb 4 laps 480 feet with SPC cuing to look up and slow down for last lap SBA Gait no AD 62f82f  CGA STS with 3# chest press 2 sets 5 4 inch step up with 4WW 5 x each leg 2 sets RT stronger than left Standing Rows red 2x10 Standing shoulder Ext red 2x10    07/10/22  NuStep L5 x 6 min Gait SPC 360ft24ft no AD 62ft 35fStanding Rows red 2x10 Standing shoulder Ext red 2x10 Gait no AD 75 ft, LOB x1 needed HHA x1 after 30ft S82fing Rows red 2x10 Standing shoulder Ext red 2x10 S2S 3x5  Standing ball toss   07/07/22 NuStep L5 x 7 min GOALS Stair negotiation 4 and 6 in, heavy UE use, L knee pain when descending stairs Shoulder Ext red 2x10 ALT 6in box taps SPC 2x10 LAQ 2lb 3x10 Hip add ball squeezes   07/03/22 NuStep L5 x6mins W68ming 2 laps with cane  greenTB twists for core 2x10 Shoulder ext 5# 2x10 Step ups on airex in RW  S2S on airex 4x5  Side steps along mat   07/01/22 NuStep L5 x6mins  S29mfrom elevated mat 3x5 with progressive lowering of table  Modified  crunches on BOSU x10, then x10 holding out red ball  Grip strength with yellow 2x10 each hand Semi tandem and feet together 10s  HS curls greenTB 2x10 LAQ no weight 2x10   Rows and ext greenTB 2x10  06/26/22 Right Grip strength 33# BMI 38.1 Rolling start 15 foot walk test 7 seconds without device STS 3x5 5# straight arm pulls 2x10 Feet on ball K2C, trunk rotation, small bridges, isometric abs Gait with rollator out the back door and around to the front of th building  PATIENT EDUCATION: Education details: POC and HEP Person educated: Patient Education method: Explanation Education comprehension: verbalized understanding   HOME EXERCISE PROGRAM: Access Code: [email or web address] URL: [email or web address] Date: 05/22/2022 Prepared by: Mona SajjAndris Baumannes - Seated Hip Abduction with Resistance  - 2 x daily - 7 x weekly - 2 sets - 10 reps - Seated Hip Adduction Isometrics with Ball  - 2 x daily - 7 x weekly - 2 sets - 10 reps - Standing March with Counter Support  - 2 x daily - 7 x weekly - 2 sets - 10 reps    GOALS: Goals reviewed with patient? Yes  SHORT TERM GOALS: Target date: 07/03/22  Patient will be independent with initial HEP. Goal status: Met  2.  Patient will be educated on strategies to decrease risk of falls.  Goal status: Met   LONG TERM GOALS: Target date: 08/14/22  Patient will be independent with advanced/ongoing HEP to improve outcomes and carryover.  Goal status: INITIAL  2.  Patient will be able to ambulate 600' with LRAD with good safety to access community.  Baseline: using rollator Goal status: 353f SPC Progressing 07/08/22  07/17/22 480 feet with SPC  3.  Patient will be able to step up/down curb safely with LRAD for safety with community ambulation.  Baseline: unable to navigate curbs Goal status: INITIAL   4.  Patient will demonstrate improved functional LE strength as demonstrated by increase in 1 muscle grade in all weak  groups. Goal status: All muscle groups have met goal except for quads pain prevent maximum effort 07/08/22  5.  Patient will demonstrate decreased fall risk by scoring < 15 sec on 5xSTS. Baseline: 18.99s Goal status: 13.66s Met 07/08/22  6.  Patient will demonstrate decreased fall risk by scoring < 10 sec on TUG. Baseline: 15.96s Goal status: Met 10.06 06/19/22    07/17/22 no AD 16 sec, with SPC 11.92 sec progressing ASSESSMENT:  CLINICAL IMPRESSION: pt states PT is really helping and would like to continue progressed ex with increased wt and resistance REHAB POTENTIAL: Good  CLINICAL DECISION MAKING: Stable/uncomplicated  EVALUATION COMPLEXITY: Low  PLAN: PT FREQUENCY: 2x/week  PT DURATION: 12 weeks  PLANNED INTERVENTIONS: Therapeutic exercises, Therapeutic activity, Neuromuscular re-education, Balance training, Gait training, Patient/Family education, Self Care, Joint mobilization, Stair training, Cryotherapy, Moist heat, Manual lymph drainage, Compression bandaging, Ionotophoresis 481mml Dexamethasone, and Manual therapy  PLAN FOR NEXT SESSION: assess goals and due renewal- rec add'l month of PT as she feels therapy is really helping and getting stronger, doing more    Silverio Hagan,ANGIE, PTA 08/12/2022, 10:48 AMSugar GroveGr[place]entine[place]1898hone: [phone] Fax:  [phone] Details  Name: Whitney SHELVINRN: [id number] of Birth: [birth year]eferring Provider:  MiKathyrn LassMD  Encounter Date: 08/12/2022   PALaqueta CarinaPTA 08/12/2022, 10:48 AM  Co[place]Gr[place]2715183hone: [phone] Fax:  33[place] CityGr[place] Valley[place]2747841hone: [phone] Fax:  [phone] Details  Name: Whitney Chavez: [id number] of Birth: [birth year]eferring Provider:  MiKathyrn LassMD  Encounter Date: 08/12/2022   PALaqueta CarinaPTA 08/12/2022, 10:48 AM  CoHawaiian AcresGrColfax[place]2774935hone: [phone] Fax:  [phone]

## 2022-08-14 ENCOUNTER — Ambulatory Visit: Payer: Medicare Other | Admitting: Physical Therapy

## 2022-08-14 DIAGNOSIS — R262 Difficulty in walking, not elsewhere classified: Secondary | ICD-10-CM

## 2022-08-14 DIAGNOSIS — R2681 Unsteadiness on feet: Secondary | ICD-10-CM

## 2022-08-14 DIAGNOSIS — M6281 Muscle weakness (generalized): Secondary | ICD-10-CM

## 2022-08-14 NOTE — Therapy (Signed)
OUTPATIENT PHYSICAL THERAPY NEURO TREATMENT   Patient Name: Whitney Chavez MRN: [id number] DOB:03/24/1956, 66 y.o., female Today's Date: 08/14/2022   PCP: Kathyrn Lass REFERRING PROVIDER: Arlice Colt   PT End of Session - 08/14/22 1052     Visit Number 18    Date for PT Re-Evaluation 08/14/22    PT Start Time 48    PT Stop Time 1140    PT Time Calculation (min) 50 min                 Past Medical History:  Diagnosis Date   Asthma    Axillary vein thrombosis ([place]) 08/16/2015   Calciphylaxis    Chronic kidney disease    Colitis    COPD (chronic obstructive pulmonary disease) ([place])    Factor V Leiden ([place])    Hypertension    Lymphedema    legs   MRSA (methicillin resistant staph aureus) culture positive    Neuropathy    Obstructive sleep apnea 03/29/2015   OSA on CPAP    Vision abnormalities    Past Surgical History:  Procedure Laterality Date   APPLICATION OF WOUND VAC     AV FISTULA PLACEMENT Left    ESOPHAGOGASTRODUODENOSCOPY (EGD) WITH PROPOFOL Bilateral 03/19/2022   Procedure: ESOPHAGOGASTRODUODENOSCOPY (EGD) WITH PROPOFOL;  Surgeon: Arta Silence, MD;  Location: [facility];  Service: Gastroenterology;  Laterality: Bilateral;   lumbar decompression fusion     TONSILLECTOMY     Patient Active Problem List   Diagnosis Date Noted   Eye redness 01/28/2021   Essential tremor 04/10/2020   Calciphylaxis 08/03/2018   Flatfoot 04/01/2018   Chronic pain syndrome 11/12/2017   Neuropathic pain 11/12/2017   COPD, moderate ([place]) 10/23/2017   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   Open abdominal wall wound 10/23/2017   Colitis 10/23/2017   Normocytic anemia 10/23/2017   Moderate major depression, single episode ([place]) 07/16/2017   Encounter for peritoneal dialysis catheter insertion ([place]) 06/18/2017   Panniculitis 04/28/2017   Long term current use of anticoagulant 01/17/2016   Factor V Leiden mutation ([place]) 01/17/2016   Axillary vein thrombosis ([place]) 08/16/2015    OSA on CPAP 03/29/2015   Factor 5 Leiden mutation, heterozygous ([place]) 03/29/2015   Insomnia 03/29/2015   Polyneuropathy 03/29/2015   Restless leg 03/29/2015   Gait disturbance 03/29/2015   Benign hypertension with end-stage renal disease ([place]) 03/01/2015   Asthma-COPD overlap syndrome 08/19/2013   Morbid (severe) obesity due to excess calories ([place]) 08/19/2013   Morbid obesity with BMI of 45.0-49.9, adult ([place]) 08/19/2013   End-stage renal disease on hemodialysis ([place]) 08/18/2013    ONSET DATE: 04/22/22  REFERRING DIAG: G62.9, R26.9  THERAPY DIAG:  Difficulty in walking, not elsewhere classified  Muscle weakness (generalized)  Unsteadiness on feet  Rationale for Evaluation and Treatment Rehabilitation  SUBJECTIVE:  SUBJECTIVE STATEMENT:  going pretty good. PT is really helping   Pt accompanied by: self  PERTINENT HISTORY: lumbar decompression fusion, COPD, CKD, neuropathy, dialysis   PAIN:  Are you having pain? Yes: NPRS scale: 4/10 Pain location: knees, wrist, low back Pain description: toothachey grinding pain Aggravating factors: bending knees trying straighten knees out all the way  Relieving factors: staying off of knees   PRECAUTIONS: Fall  WEIGHT BEARING RESTRICTIONS No  FALLS: Has patient fallen in last 6 months? No  LIVING ENVIRONMENT: Lives with: lives alone Lives in: House/apartment Stairs: No Has following equipment at home: Single point cane and Environmental consultant - 4 wheeled  PLOF: Independent  PATIENT GOALS walk better, balance better, strengthen my legs   OBJECTIVE:    COGNITION: Overall cognitive status: Within functional limits for tasks assessed   SENSATION: WFL   EDEMA:  Circumferential: patient has lymphedema in BLE and wears bandages   MUSCLE  LENGTH: Hamstrings: tightness bilaterally, limited due to bandages and swelling from lymphedema   POSTURE: rounded shoulders, forward head, increased thoracic kyphosis, and flexed trunk   LOWER EXTREMITY ROM:   WFL, limited knee extension bilaterally due to calciphylaxis and lymphedema    LOWER EXTREMITY MMT:    MMT Right Eval Left Eval Right  06/19/22 Left 06/19/22 Right 07/08/22 Left  07/08/22  Hip flexion 4 4- 4+ 4 4+ 4+  Hip extension        Hip abduction _0 Hip adduction _1 Hip internal rotation        Hip external rotation        Knee flexion _2 4+ 5 5  Knee extension 3+ _3 grinding pain 3+ grinding pain  Ankle dorsiflexion 5 5      Ankle plantarflexion 5 5      Ankle inversion        Ankle eversion        (Blank rows = not tested)  TRANSFERS: Assistive device utilized: Environmental consultant - 4 wheeled  Sit to stand: Complete Independence Stand to sit: Modified independence Chair to chair: Modified independence   GAIT: Gait pattern: step to pattern, decreased step length- Right, decreased step length- Left, decreased stride length, decreased trunk rotation, trunk flexed, wide BOS, poor foot clearance- Right, and poor foot clearance- Left Distance walked: in clinic distances  Assistive device utilized: Environmental consultant - 4 wheeled Level of assistance: Complete Independence Comments: primarily using rollator to ambulate but uses SPC for short distances   FUNCTIONAL TESTs:  5 times sit to stand: 18 seconds on 06/26/22 Timed up and go (TUG): 15.96s  TODAY'S TREATMENT:   08/14/22 Nustep L 6 8 min ( pt really pushing) Up and down 3 4 inch step with SPC and rail, step too and defiante need of rail Up and down 2 6 inch steps with SPC and rail SBA with pain and compensations with turning SW dt pain Seated Tband- trunk flex and ext 2 sets 10, shld row and shld ext 2 sets 10 Step ups 2 sets 5 4 inch cane and SPC with cuing to decrease compensation Alt step tap 20  x with spc and rail 20x then 20x with just spc- CGA ,much harder for pt Amb with SPC at end of session  3 laps but very fatigued    08/12/22 L 6 7 min 4# LAQ 2 sets 10 Blue tband HS curl 2 sets 10 Hip flex into  abd 4# 2 sets 10 3 laps 350 feet SPC SBA 2 x STS with wt ball press 10 x Seated crunches 15 x Wt ball trunk rotation 10 x each   07/31/22 NuStep L5 x 7 min Attempted to ambulate with SPC but unable to due to increase pain. Gait with rollator 120 ft S2S 3x5  Standing rows green 2x15 Standing shoulder Ext 2x12 Gait w/ SPC 120 feet one lap Gait w/ rollator 21f Seated crunches  07/29/22 Nustep L 5 7 min Gait w/ SPC 3657fthree laps Seated rest Gait w/ SPC 36057fhree laps STS without UE 2 sets 5 3# LAQ 2 sets 10 HHA standing 3#  hip flex,ext and abd alt 20 x  ( tried marching but too much pain) Standing rows green 2x15 Standing shoulder Ext 2x12     07/24/22 NuStep L5 x 7 min  Standing rows green 2x15 Standing shoulder Ext 2x12 Gait w/ SPC 240f48fo laps Gait w/ SPC 360ft14fee laps Alt 4in box taps w/ SPC 2x10 Side step in front of mat table  Hamstring curls green 2x15   07/22/22 NuStep L5 x6mins82mlking with SPC 1 lap Walking with RW 2 laps STS 4x5 Standing rows and ext greenTB 2x10 Alt box taps 6" with SPC x5 each side, then with 4"    07/17/22 Nustep L5 6 min Amb 4 laps 480 feet with SPC cuing to look up and slow down for last lap SBA Gait no AD 25ft x47fGA STS with 3# chest press 2 sets 5 4 inch step up with 4WW 5 x each leg 2 sets RT stronger than left Standing Rows red 2x10 Standing shoulder Ext red 2x10    07/10/22  NuStep L5 x 6 min Gait SPC 360ft Ga95fo AD 25ft x2 104fnding Rows red 2x10 Standing shoulder Ext red 2x10 Gait no AD 75 ft, LOB x1 needed HHA x1 after 30ft Stan80f Rows red 2x10 Standing shoulder Ext red 2x10 S2S 3x5  Standing ball toss   07/07/22 NuStep L5 x 7 min GOALS Stair negotiation 4 and 6 in,  heavy UE use, L knee pain when descending stairs Shoulder Ext red 2x10 ALT 6in box taps SPC 2x10 LAQ 2lb 3x10 Hip add ball squeezes   07/03/22 NuStep L5 x6mins Walk78m 2 laps with cane  greenTB twists for core 2x10 Shoulder ext 5# 2x10 Step ups on airex in RW  S2S on airex 4x5  Side steps along mat   07/01/22 NuStep L5 x6mins  S2S 78mm elevated mat 3x5 with progressive lowering of table  Modified crunches on BOSU x10, then x10 holding out red ball  Grip strength with yellow 2x10 each hand Semi tandem and feet together 10s  HS curls greenTB 2x10 LAQ no weight 2x10   Rows and ext greenTB 2x10  06/26/22 Right Grip strength 33# BMI 38.1 Rolling start 15 foot walk test 7 seconds without device STS 3x5 5# straight arm pulls 2x10 Feet on ball K2C, trunk rotation, small bridges, isometric abs Gait with rollator out the back door and around to the front of th building  PATIENT EDUCATION: Education details: POC and HEP Person educated: Patient Education method: Explanation Education comprehension: verbalized understanding   HOME EXERCISE PROGRAM: Access Code: [email or web address] URL: [email or web address] Date: 05/22/2022 Prepared by: Mona Sajjad Andris Baumann- Seated Hip Abduction with Resistance  - 2 x daily - 7 x weekly - 2 sets - 10 reps - Seated Hip Adduction Isometrics with Ball  -Diona Foley  2 x daily - 7 x weekly - 2 sets - 10 reps - Standing March with Counter Support  - 2 x daily - 7 x weekly - 2 sets - 10 reps    GOALS: Goals reviewed with patient? Yes  SHORT TERM GOALS: Target date: 07/03/22  Patient will be independent with initial HEP. Goal status: Met  2.  Patient will be educated on strategies to decrease risk of falls.  Goal status: Met   LONG TERM GOALS: Target date: 09/25/22  Patient will be independent with advanced/ongoing HEP to improve outcomes and carryover.  Goal status: progressing as pt gets stronger 08/14/22  2.  Patient will be able to  ambulate 600' with LRAD with good safety to access community.  Baseline: using rollator Goal status: 364f SPC Progressing 07/08/22  07/17/22 480 feet with SPC 08/14/22  SPC 400 feet supervsion  3.  Patient will be able to step up/down curb safely with LRAD for safety with community ambulation.  Baseline: unable to navigate curbs Goal status: progressing 08/14/22   4.  Patient will demonstrate improved functional LE strength as demonstrated by increase in 1 muscle grade in all weak groups. Goal status: All muscle groups have met goal except for quads pain prevent maximum effort 07/08/22. 11/30 met for all but quads  5.  Patient will demonstrate decreased fall risk by scoring < 15 sec on 5xSTS. Baseline: 18.99s Goal status: 13.66s Met 07/08/22  6.  Patient will demonstrate decreased fall risk by scoring < 10 sec on TUG. Baseline: 15.96s Goal status: Met 10.06 06/19/22    07/17/22 no AD 16 sec, with SPC 11.92 sec progressing ASSESSMENT:  CLINICAL IMPRESSION: pt states PT is really helping and would like to continue , see note for goal assessment. MMT well ( fatigues quickly but improving)and WFLS minus quad weakness sand some pain. Continue to work on stamina and increased gait distance and ability to navigate community with ramp and steps/curbs. Pt really struggles with steps pain and weakness.  REHAB POTENTIAL: Good  CLINICAL DECISION MAKING: Stable/uncomplicated  EVALUATION COMPLEXITY: Low  PLAN: PT FREQUENCY: 2x/week  PT DURATION: 12 weeks  PLANNED INTERVENTIONS: Therapeutic exercises, Therapeutic activity, Neuromuscular re-education, Balance training, Gait training, Patient/Family education, Self Care, Joint mobilization, Stair training, Cryotherapy, Moist heat, Manual lymph drainage, Compression bandaging, Ionotophoresis 485mml Dexamethasone, and Manual therapy  PLAN FOR NEXT SESSION: gait progression, stamina and community reentry. Renewal DoFabio Neighbors PTA 08/14/2022, 10:53 AMSt. Peter[address]Apple [place]hone: [phone] Fax:  [phone] Details  Name: Whitney NOVOSADRN: [id number] of Birth: [birth year]eferring Provider:  MiKathyrn LassMD  Encounter Date: 08/14/2022   PALaqueta CarinaPTA 08/14/2022, 10:53 AM  [place]hone: [phone] Fax:  33[place]hone: [phone] Fax:  [phone] Details  Name: Whitney DISSRN: [id number] of Birth: 1002-Dec-1957eferring Provider:  MiKathyrn LassMD  Encounter Date: 08/14/2022   PALaqueta CarinaPTA 08/14/2022, 10:53 AM  [place]hone: [phone] Fax:  33[place]381hone: [phone] Fax:  [phone] Details  Name: Whitney Chavez MRN: [id number] Date of Birth: 06/06/1956 Referring Provider:  Kathyrn Lass, MD  Encounter Date: 08/14/2022   Laqueta Carina, PTA 08/14/2022, 10:53 AM  [place]. [place], [place], [address] Phone: [phone]   Fax:  [phone]

## 2022-08-18 NOTE — Therapy (Signed)
OUTPATIENT PHYSICAL THERAPY NEURO TREATMENT   Patient Name: Whitney Chavez MRN: [id number] DOB:[birth year], 66 y.o., female Today's Date: 08/19/2022   PCP: Kathyrn Lass REFERRING PROVIDER: Arlice Colt   PT End of Session - 08/19/22 1134     Visit Number 19    Date for PT Re-Evaluation 09/25/22    PT Start Time 1133    PT Stop Time 1218    PT Time Calculation (min) 45 min    Activity Tolerance Patient tolerated treatment well;Patient limited by fatigue    Behavior During Therapy [facility] for tasks assessed/performed                 Past Medical History:  Diagnosis Date   Asthma    Axillary vein thrombosis ([place]) 08/16/2015   Calciphylaxis    Chronic kidney disease    Colitis    COPD (chronic obstructive pulmonary disease) ([facility])    Factor V Leiden ([place])    Hypertension    Lymphedema    legs   MRSA (methicillin resistant staph aureus) culture positive    Neuropathy    Obstructive sleep apnea 03/29/2015   OSA on CPAP    Vision abnormalities    Past Surgical History:  Procedure Laterality Date   APPLICATION OF WOUND VAC     AV FISTULA PLACEMENT Left    ESOPHAGOGASTRODUODENOSCOPY (EGD) WITH PROPOFOL Bilateral 03/19/2022   Procedure: ESOPHAGOGASTRODUODENOSCOPY (EGD) WITH PROPOFOL;  Surgeon: Arta Silence, MD;  [facility];  Service: Gastroenterology;  Laterality: Bilateral;   lumbar decompression fusion     TONSILLECTOMY     Patient Active Problem List   Diagnosis Date Noted   Eye redness 01/28/2021   Essential tremor 04/10/2020   Calciphylaxis 08/03/2018   Flatfoot 04/01/2018   Chronic pain syndrome 11/12/2017   Neuropathic pain 11/12/2017   COPD, moderate ([place]) 10/23/2017   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   Open abdominal wall wound 10/23/2017   Colitis 10/23/2017   Normocytic anemia 10/23/2017   Moderate major depression, single episode ([place]) 07/16/2017   Encounter for peritoneal dialysis catheter insertion ([place]) 06/18/2017   Panniculitis 04/28/2017    Long term current use of anticoagulant 01/17/2016   Factor V Leiden mutation ([place]) 01/17/2016   Axillary vein thrombosis ([place]) 08/16/2015   OSA on CPAP 03/29/2015   Factor 5 Leiden mutation, heterozygous ([place]) 03/29/2015   Insomnia 03/29/2015   Polyneuropathy 03/29/2015   Restless leg 03/29/2015   Gait disturbance 03/29/2015   Benign hypertension with end-stage renal disease ([place]) 03/01/2015   Asthma-COPD overlap syndrome 08/19/2013   Morbid (severe) obesity due to excess calories ([place]) 08/19/2013   Morbid obesity with BMI of 45.0-49.9, adult ([place]) 08/19/2013   End-stage renal disease on hemodialysis ([place]) 08/18/2013    ONSET DATE: 04/22/22  REFERRING DIAG: G62.9, R26.9  THERAPY DIAG:  Difficulty in walking, not elsewhere classified  Muscle weakness (generalized)  Unsteadiness on feet  Other abnormalities of gait and mobility  Other lack of coordination  Unspecified lack of coordination  Abnormal posture  Gait disturbance  Rationale for Evaluation and Treatment Rehabilitation  SUBJECTIVE:  SUBJECTIVE STATEMENT:  doing well, my knees hurt but that's normal   Pt accompanied by: self  PERTINENT HISTORY: lumbar decompression fusion, COPD, CKD, neuropathy, dialysis   PAIN:  Are you having pain? Yes: NPRS scale: 4/10 Pain location: knees, wrist, low back Pain description: toothachey grinding pain Aggravating factors: bending knees trying straighten knees out all the way  Relieving factors: staying off of knees   PRECAUTIONS: Fall  WEIGHT BEARING RESTRICTIONS No  FALLS: Has patient fallen in last 6 months? No  LIVING ENVIRONMENT: Lives with: lives alone Lives in: House/apartment Stairs: No Has following equipment at home: Single point cane and Walker - 4 wheeled  PLOF:  Independent  PATIENT GOALS walk better, balance better, strengthen my legs   OBJECTIVE:    COGNITION: Overall cognitive status: Within functional limits for tasks assessed   SENSATION: WFL   EDEMA:  Circumferential: patient has lymphedema in BLE and wears bandages   MUSCLE LENGTH: Hamstrings: tightness bilaterally, limited due to bandages and swelling from lymphedema   POSTURE: rounded shoulders, forward head, increased thoracic kyphosis, and flexed trunk   LOWER EXTREMITY ROM:   WFL, limited knee extension bilaterally due to calciphylaxis and lymphedema    LOWER EXTREMITY MMT:    MMT Right Eval Left Eval Right  06/19/22 Left 06/19/22 Right 07/08/22 Left  07/08/22  Hip flexion 4 4- 4+ 4 4+ 4+  Hip extension        Hip abduction 4 4 5 5 5 5  Hip adduction 4 4 5 5 5 5  Hip internal rotation        Hip external rotation        Knee flexion 3 3 5 4+ 5 5  Knee extension 3+ 3 4 3 4 grinding pain 3+ grinding pain  Ankle dorsiflexion 5 5      Ankle plantarflexion 5 5      Ankle inversion        Ankle eversion        (Blank rows = not tested)  TRANSFERS: Assistive device utilized: Walker - 4 wheeled  Sit to stand: Complete Independence Stand to sit: Modified independence Chair to chair: Modified independence   GAIT: Gait pattern: step to pattern, decreased step length- Right, decreased step length- Left, decreased stride length, decreased trunk rotation, trunk flexed, wide BOS, poor foot clearance- Right, and poor foot clearance- Left Distance walked: in clinic distances  Assistive device utilized: Walker - 4 wheeled Level of assistance: Complete Independence Comments: primarily using rollator to ambulate but uses SPC for short distances   FUNCTIONAL TESTs:  5 times sit to stand: 18 seconds on 06/26/22 Timed up and go (TUG): 15.96s  TODAY'S TREATMENT:  08/19/22 NuStep L5 x8mins Heel taps 4" holding on to rollator, tried w/o but unable to do STS on airex 4x5  on elevated mat table  Walking with cane 3 laps  blackTB ext 2x10   08/14/22 Nustep L 6 8 min ( pt really pushing) Up and down 3 4 inch step with SPC and rail, step too and defiante need of rail Up and down 2 6 inch steps with SPC and rail SBA with pain and compensations with turning SW dt pain Seated Tband- trunk flex and ext 2 sets 10, shld row and shld ext 2 sets 10 Step ups 2 sets 5 4 inch cane and SPC with cuing to decrease compensation Alt step tap 20 x with spc and rail 20x then 20x with just spc- CGA ,much harder for   pt Amb with SPC at end of session  3 laps but very fatigued    08/12/22 L 6 7 min 4# LAQ 2 sets 10 Blue tband HS curl 2 sets 10 Hip flex into abd 4# 2 sets 10 3 laps 350 feet SPC SBA 2 x STS with wt ball press 10 x Seated crunches 15 x Wt ball trunk rotation 10 x each   07/31/22 NuStep L5 x 7 min Attempted to ambulate with SPC but unable to due to increase pain. Gait with rollator 120 ft S2S 3x5  Standing rows green 2x15 Standing shoulder Ext 2x12 Gait w/ SPC 120 feet one lap Gait w/ rollator 200ft Seated crunches  07/29/22 Nustep L 5 7 min Gait w/ SPC 360ft three laps Seated rest Gait w/ SPC 360ft three laps STS without UE 2 sets 5 3# LAQ 2 sets 10 HHA standing 3#  hip flex,ext and abd alt 20 x  ( tried marching but too much pain) Standing rows green 2x15 Standing shoulder Ext 2x12     07/24/22 NuStep L5 x 7 min  Standing rows green 2x15 Standing shoulder Ext 2x12 Gait w/ SPC 240ft two laps Gait w/ SPC 360ft three laps Alt 4in box taps w/ SPC 2x10 Side step in front of mat table  Hamstring curls green 2x15   07/22/22 NuStep L5 x6mins Walking with SPC 1 lap Walking with RW 2 laps STS 4x5 Standing rows and ext greenTB 2x10 Alt box taps 6" with SPC x5 each side, then with 4"     PATIENT EDUCATION: Education details: POC and HEP Person educated: Patient Education method: Explanation Education comprehension: verbalized  understanding   HOME EXERCISE PROGRAM: Access Code: [email or web address] URL: [email or web address] Date: 05/22/2022 Prepared by:    Exercises - Seated Hip Abduction with Resistance  - 2 x daily - 7 x weekly - 2 sets - 10 reps - Seated Hip Adduction Isometrics with Ball  - 2 x daily - 7 x weekly - 2 sets - 10 reps - Standing March with Counter Support  - 2 x daily - 7 x weekly - 2 sets - 10 reps    GOALS: Goals reviewed with patient? Yes  SHORT TERM GOALS: Target date: 07/03/22  Patient will be independent with initial HEP. Goal status: Met  2.  Patient will be educated on strategies to decrease risk of falls.  Goal status: Met   LONG TERM GOALS: Target date: 09/25/22  Patient will be independent with advanced/ongoing HEP to improve outcomes and carryover.  Goal status: progressing as pt gets stronger 08/14/22  2.  Patient will be able to ambulate 600' with LRAD with good safety to access community.  Baseline: using rollator Goal status: 360ft SPC Progressing 07/08/22  07/17/22 480 feet with SPC 08/14/22  SPC 400 feet supervsion  3.  Patient will be able to step up/down curb safely with LRAD for safety with community ambulation.  Baseline: unable to navigate curbs Goal status: progressing 08/14/22   4.  Patient will demonstrate improved functional LE strength as demonstrated by increase in 1 muscle grade in all weak groups. Goal status: All muscle groups have met goal except for quads pain prevent maximum effort 07/08/22. 11/30 met for all but quads  5.  Patient will demonstrate decreased fall risk by scoring < 15 sec on 5xSTS. Baseline: 18.99s Goal status: 13.66s Met 07/08/22  6.  Patient will demonstrate decreased fall risk by scoring < 10 sec on TUG. Baseline: 15.96s Goal status: Met 10.06   06/19/22    07/17/22 no AD 16 sec, with SPC 11.92 sec progressing ASSESSMENT:  CLINICAL IMPRESSION: pt states PT is really helping and would like to continue , see  note for goal assessment. MMT well ( fatigues quickly but improving)and WFLS minus quad weakness sand some pain. Continue to work on stamina and increased gait distance and ability to navigate community with ramp and steps/curbs. Pt really struggles with steps pain and weakness.  REHAB POTENTIAL: Good  CLINICAL DECISION MAKING: Stable/uncomplicated  EVALUATION COMPLEXITY: Low  PLAN: PT FREQUENCY: 2x/week  PT DURATION: 12 weeks  PLANNED INTERVENTIONS: Therapeutic exercises, Therapeutic activity, Neuromuscular re-education, Balance training, Gait training, Patient/Family education, Self Care, Joint mobilization, Stair training, Cryotherapy, Moist heat, Manual lymph drainage, Compression bandaging, Ionotophoresis 4mg/ml Dexamethasone, and Manual therapy  PLAN FOR NEXT SESSION: gait progression, stamina and community reentry. Renewal Done     , PT 08/19/2022, 12:18 [facility] [address] [place], [place], [address] Phone: [phone]   Fax:  [phone]  Patient Details  Name: Whitney Chavez MRN: [id number] Date of Birth: 11/14/1955 Referring Provider:  Miller, Lisa, MD  Encounter Date: 08/19/2022     , PT 08/19/2022, 12:18 PM  [facility] [address] [place], [place], [address] Phone: [phone]   Fax:  [phone] [address] [place], [place], [address] Phone: [phone]   Fax:  [phone]  Patient Details  Name: Whitney Chavez MRN: [id number] Date of Birth: 11/13/1955 Referring Provider:  Miller, Lisa, MD  Encounter Date: 08/19/2022     , PT 08/19/2022, 12:18 PM  [facility] [address] [place], [place], [address] Phone: [phone]   Fax:  [phone] [address] [place], [place], [address] Phone: [phone]   Fax:  [phone]  Patient Details  Name: Whitney Chavez MRN: [id number] Date of Birth: 06/23/1956 Referring Provider:  Miller, Lisa, MD  Encounter Date: 08/19/2022     , PT 08/19/2022, 12:18 PM  [facility] [address] [place], [place], [address] Phone: [phone]   Fax:  [phone] 

## 2022-08-19 ENCOUNTER — Ambulatory Visit: Payer: Medicare Other | Attending: Neurology

## 2022-08-19 DIAGNOSIS — R2689 Other abnormalities of gait and mobility: Secondary | ICD-10-CM | POA: Diagnosis present

## 2022-08-19 DIAGNOSIS — R269 Unspecified abnormalities of gait and mobility: Secondary | ICD-10-CM | POA: Insufficient documentation

## 2022-08-19 DIAGNOSIS — M6281 Muscle weakness (generalized): Secondary | ICD-10-CM | POA: Insufficient documentation

## 2022-08-19 DIAGNOSIS — R293 Abnormal posture: Secondary | ICD-10-CM | POA: Insufficient documentation

## 2022-08-19 DIAGNOSIS — R2681 Unsteadiness on feet: Secondary | ICD-10-CM | POA: Insufficient documentation

## 2022-08-19 DIAGNOSIS — R278 Other lack of coordination: Secondary | ICD-10-CM | POA: Diagnosis present

## 2022-08-19 DIAGNOSIS — R279 Unspecified lack of coordination: Secondary | ICD-10-CM | POA: Diagnosis present

## 2022-08-19 DIAGNOSIS — R262 Difficulty in walking, not elsewhere classified: Secondary | ICD-10-CM | POA: Diagnosis present

## 2022-08-21 ENCOUNTER — Ambulatory Visit: Payer: Medicare Other | Admitting: Physical Therapy

## 2022-08-21 ENCOUNTER — Encounter: Payer: Self-pay | Admitting: Physical Therapy

## 2022-08-21 DIAGNOSIS — R262 Difficulty in walking, not elsewhere classified: Secondary | ICD-10-CM

## 2022-08-21 DIAGNOSIS — M6281 Muscle weakness (generalized): Secondary | ICD-10-CM

## 2022-08-21 DIAGNOSIS — R2681 Unsteadiness on feet: Secondary | ICD-10-CM

## 2022-08-21 NOTE — Therapy (Signed)
OUTPATIENT PHYSICAL THERAPY NEURO TREATMENT  Progress Note Reporting Period 07/10/22 to 08/21/22  See note below for Objective Data and Assessment of Progress/Goals.     Patient Name: Whitney Chavez MRN: [id number] DOB:05-18-1956, 66 y.o., female Today's Date: 08/21/2022   PCP: Kathyrn Lass REFERRING PROVIDER: Arlice Colt   PT End of Session - 08/21/22 1016     Visit Number 20    Date for PT Re-Evaluation 09/25/22    PT Start Time 1015    PT Stop Time 1100    PT Time Calculation (min) 45 min    Activity Tolerance Patient tolerated treatment well;Patient limited by fatigue    Behavior During Therapy [facility] for tasks assessed/performed                 Past Medical History:  Diagnosis Date   Asthma    Axillary vein thrombosis ([place]) 08/16/2015   Calciphylaxis    Chronic kidney disease    Colitis    COPD (chronic obstructive pulmonary disease) ([place])    Factor V Leiden ([place])    Hypertension    Lymphedema    legs   MRSA (methicillin resistant staph aureus) culture positive    Neuropathy    Obstructive sleep apnea 03/29/2015   OSA on CPAP    Vision abnormalities    Past Surgical History:  Procedure Laterality Date   APPLICATION OF WOUND VAC     AV FISTULA PLACEMENT Left    ESOPHAGOGASTRODUODENOSCOPY (EGD) WITH PROPOFOL Bilateral 03/19/2022   Procedure: ESOPHAGOGASTRODUODENOSCOPY (EGD) WITH PROPOFOL;  Surgeon: Arta Silence, MD;  Location: [facility];  Service: Gastroenterology;  Laterality: Bilateral;   lumbar decompression fusion     TONSILLECTOMY     Patient Active Problem List   Diagnosis Date Noted   Eye redness 01/28/2021   Essential tremor 04/10/2020   Calciphylaxis 08/03/2018   Flatfoot 04/01/2018   Chronic pain syndrome 11/12/2017   Neuropathic pain 11/12/2017   COPD, moderate ([place]) 10/23/2017   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   Open abdominal wall wound 10/23/2017   Colitis 10/23/2017   Normocytic anemia 10/23/2017   Moderate major depression,  single episode ([place]) 07/16/2017   Encounter for peritoneal dialysis catheter insertion ([place]) 06/18/2017   Panniculitis 04/28/2017   Long term current use of anticoagulant 01/17/2016   Factor V Leiden mutation ([place]) 01/17/2016   Axillary vein thrombosis ([place]) 08/16/2015   OSA on CPAP 03/29/2015   Factor 5 Leiden mutation, heterozygous ([place]) 03/29/2015   Insomnia 03/29/2015   Polyneuropathy 03/29/2015   Restless leg 03/29/2015   Gait disturbance 03/29/2015   Benign hypertension with end-stage renal disease ([place]) 03/01/2015   Asthma-COPD overlap syndrome 08/19/2013   Morbid (severe) obesity due to excess calories ([place]) 08/19/2013   Morbid obesity with BMI of 45.0-49.9, adult ([place]) 08/19/2013   End-stage renal disease on hemodialysis ([place]) 08/18/2013    ONSET DATE: 04/22/22  REFERRING DIAG: G62.9, R26.9  THERAPY DIAG:  Difficulty in walking, not elsewhere classified  Muscle weakness (generalized)  Unsteadiness on feet  Rationale for Evaluation and Treatment Rehabilitation  SUBJECTIVE:  SUBJECTIVE STATEMENT:   My knees are about a 4 other than that I'm good  Pt accompanied by: self  PERTINENT HISTORY: lumbar decompression fusion, COPD, CKD, neuropathy, dialysis   PAIN:  Are you having pain? Yes: NPRS scale: 4/10 Pain location: knees, wrist, low back Pain description: toothachey grinding pain Aggravating factors: bending knees trying straighten knees out all the way  Relieving factors: staying off of knees   PRECAUTIONS: Fall  WEIGHT BEARING RESTRICTIONS No  FALLS: Has patient fallen in last 6 months? No  LIVING ENVIRONMENT: Lives with: lives alone Lives in: House/apartment Stairs: No Has following equipment at home: Single point cane and Environmental consultant - 4 wheeled  PLOF:  Independent  PATIENT GOALS walk better, balance better, strengthen my legs   OBJECTIVE:    COGNITION: Overall cognitive status: Within functional limits for tasks assessed   SENSATION: WFL   EDEMA:  Circumferential: patient has lymphedema in BLE and wears bandages   MUSCLE LENGTH: Hamstrings: tightness bilaterally, limited due to bandages and swelling from lymphedema   POSTURE: rounded shoulders, forward head, increased thoracic kyphosis, and flexed trunk   LOWER EXTREMITY ROM:   WFL, limited knee extension bilaterally due to calciphylaxis and lymphedema    LOWER EXTREMITY MMT:    MMT Right Eval Left Eval Right  06/19/22 Left 06/19/22 Right 07/08/22 Left  07/08/22 Right 08/21/22 Left 08/21/22  Hip flexion 4 4- 4+ 4 4+ 4+ 4+ 5  Hip extension          Hip abduction _0 Hip adduction _1 Hip internal rotation          Hip external rotation          Knee flexion _2 4+ _3 Knee extension 3+ _4 grinding pain 3+ grinding pain 4+  4 grinding pain  Ankle dorsiflexion 5 5        Ankle plantarflexion 5 5        Ankle inversion          Ankle eversion          (Blank rows = not tested)  TRANSFERS: Assistive device utilized: Environmental consultant - 4 wheeled  Sit to stand: Complete Independence Stand to sit: Modified independence Chair to chair: Modified independence   GAIT: Gait pattern: step to pattern, decreased step length- Right, decreased step length- Left, decreased stride length, decreased trunk rotation, trunk flexed, wide BOS, poor foot clearance- Right, and poor foot clearance- Left Distance walked: in clinic distances  Assistive device utilized: Environmental consultant - 4 wheeled Level of assistance: Complete Independence Comments: primarily using rollator to ambulate but uses SPC for short distances   FUNCTIONAL TESTs:  5 times sit to stand: 18 seconds on 06/26/22 Timed up and go (TUG): 15.96s  TODAY'S TREATMENT:  08/21/22 NuStep L5 x 6 min   Gait with SPC 2 laps 238f  x 3  stopped due to knee pain Seated rows green 2x15 Hip add ball squeeze 2x10 S2S 4x5    08/19/22 NuStep L5 x891ms Heel taps 4" holding on to rollator, tried w/o but unable to do STS on airex 4x5 on elevated mat table  Walking with cane 3 laps  blackTB ext 2x10   08/14/22 Nustep L 6 8 min ( pt really pushing) Up and down 3 4 inch step with SPC and rail, step too and defiante need of rail Up and  down 2 6 inch steps with SPC and rail SBA with pain and compensations with turning SW dt pain Seated Tband- trunk flex and ext 2 sets 10, shld row and shld ext 2 sets 10 Step ups 2 sets 5 4 inch cane and SPC with cuing to decrease compensation Alt step tap 20 x with spc and rail 20x then 20x with just spc- CGA ,much harder for pt Amb with SPC at end of session  3 laps but very fatigued    08/12/22 L 6 7 min 4# LAQ 2 sets 10 Blue tband HS curl 2 sets 10 Hip flex into abd 4# 2 sets 10 3 laps 350 feet SPC SBA 2 x STS with wt ball press 10 x Seated crunches 15 x Wt ball trunk rotation 10 x each   07/31/22 NuStep L5 x 7 min Attempted to ambulate with SPC but unable to due to increase pain. Gait with rollator 120 ft S2S 3x5  Standing rows green 2x15 Standing shoulder Ext 2x12 Gait w/ SPC 120 feet one lap Gait w/ rollator 25f Seated crunches  07/29/22 Nustep L 5 7 min Gait w/ SPC 3676fthree laps Seated rest Gait w/ SPC 36050fhree laps STS without UE 2 sets 5 3# LAQ 2 sets 10 HHA standing 3#  hip flex,ext and abd alt 20 x  ( tried marching but too much pain) Standing rows green 2x15 Standing shoulder Ext 2x12     07/24/22 NuStep L5 x 7 min  Standing rows green 2x15 Standing shoulder Ext 2x12 Gait w/ SPC [id number] laps Gait w/ SPC [id number] laps Alt 4in box taps w/ SPC 2x10 Side step in front of mat table  Hamstring curls green 2x15   07/22/22 NuStep L5 x6mins66mlking with SPC 1 lap Walking with RW 2 laps STS 4x5 Standing  rows and ext greenTB 2x10 Alt box taps 6" with SPC x5 each side, then with 4"     PATIENT EDUCATION: Education details: POC and HEP Person educated: Patient Education method: Explanation Education comprehension: verbalized understanding   HOME EXERCISE PROGRAM: Access Code: [email or web address] URL: [email or web address] Date: 05/22/2022 Prepared by: Mona SAndris Baumanncises - Seated Hip Abduction with Resistance  - 2 x daily - 7 x weekly - 2 sets - 10 reps - Seated Hip Adduction Isometrics with Ball  - 2 x daily - 7 x weekly - 2 sets - 10 reps - Standing March with Counter Support  - 2 x daily - 7 x weekly - 2 sets - 10 reps    GOALS: Goals reviewed with patient? Yes  SHORT TERM GOALS: Target date: 07/03/22  Patient will be independent with initial HEP. Goal status: Met  2.  Patient will be educated on strategies to decrease risk of falls.  Goal status: Met   LONG TERM GOALS: Target date: 09/25/22  Patient will be independent with advanced/ongoing HEP to improve outcomes and carryover.  Goal status: progressing as pt gets stronger 08/14/22  2.  Patient will be able to ambulate 600' with LRAD with good safety to access community.  Baseline: using rollator Goal status: 360ft S105frogressing 07/08/22  07/17/22 480 feet with SPC 08/14/22  SPC 400 feet supervsion  3.  Patient will be able to step up/down curb safely with LRAD for safety with community ambulation.  Baseline: unable to navigate curbs Goal status: progressing 08/14/22   4.  Patient will demonstrate improved functional LE strength as demonstrated by increase in 1 muscle grade in  all weak groups. Goal status: Met but with pain when testing quads 08/21/22.   5.  Patient will demonstrate decreased fall risk by scoring < 15 sec on 5xSTS. Baseline: 18.99s Goal status: 13.66s Met 07/08/22  6.  Patient will demonstrate decreased fall risk by scoring < 10 sec on TUG. Baseline: 15.96s Goal status: Met 10.06  06/19/22    07/17/22 no AD 16 sec, with SPC 11.92 sec progressing ASSESSMENT:  CLINICAL IMPRESSION:  Pt continues to report PT is really helping and would like to continue. She progressed with LE strength, but has bilateral knee pain with MMT quad test.  Pt able to ambulate 600 ft with SPC but doe require seated rest due to knee pain. Heavy forward lean present with sit to stands. Pt tends to lean forward extending knees then using low back to extend trunk.  REHAB POTENTIAL: Good  CLINICAL DECISION MAKING: Stable/uncomplicated  EVALUATION COMPLEXITY: Low  PLAN: PT FREQUENCY: 2x/week  PT DURATION: 12 weeks  PLANNED INTERVENTIONS: Therapeutic exercises, Therapeutic activity, Neuromuscular re-education, Balance training, Gait training, Patient/Family education, Self Care, Joint mobilization, Stair training, Cryotherapy, Moist heat, Manual lymph drainage, Compression bandaging, Ionotophoresis 56m/ml Dexamethasone, and Manual therapy  PLAN FOR NEXT SESSION: gait progression, stamina and community reentry. Renewal Done    RScot Jun PTA 08/21/2022, 10:17 AM  [place] [address]Phone: [phone]  Fax:  [phone]

## 2022-08-26 ENCOUNTER — Encounter: Payer: Self-pay | Admitting: Physical Therapy

## 2022-08-26 ENCOUNTER — Ambulatory Visit: Payer: Medicare Other | Admitting: Physical Therapy

## 2022-08-26 ENCOUNTER — Other Ambulatory Visit: Payer: Self-pay | Admitting: Gastroenterology

## 2022-08-26 DIAGNOSIS — R2681 Unsteadiness on feet: Secondary | ICD-10-CM

## 2022-08-26 DIAGNOSIS — R262 Difficulty in walking, not elsewhere classified: Secondary | ICD-10-CM

## 2022-08-26 DIAGNOSIS — M6281 Muscle weakness (generalized): Secondary | ICD-10-CM

## 2022-08-26 NOTE — Therapy (Signed)
OUTPATIENT PHYSICAL THERAPY NEURO TREATMENT     Patient Name: Whitney Chavez MRN: [id number] DOB:[birth year], 66 y.o., female Today's Date: 08/26/2022   PCP: Kathyrn Lass REFERRING PROVIDER: Arlice Colt   PT End of Session - 08/26/22 1002     Visit Number 21    Date for PT Re-Evaluation 09/25/22    PT Start Time 1003    PT Stop Time 1048    PT Time Calculation (min) 45 min    Activity Tolerance Patient tolerated treatment well;Patient limited by fatigue    Behavior During Therapy [facility] for tasks assessed/performed                 Past Medical History:  Diagnosis Date   Asthma    Axillary vein thrombosis ([place]) 08/16/2015   Calciphylaxis    Chronic kidney disease    Colitis    COPD (chronic obstructive pulmonary disease) ([facility])    Factor V Leiden ([facility])    Hypertension    Lymphedema    legs   MRSA (methicillin resistant staph aureus) culture positive    Neuropathy    Obstructive sleep apnea 03/29/2015   OSA on CPAP    Vision abnormalities    Past Surgical History:  Procedure Laterality Date   APPLICATION OF WOUND VAC     AV FISTULA PLACEMENT Left    ESOPHAGOGASTRODUODENOSCOPY (EGD) WITH PROPOFOL Bilateral 03/19/2022   Procedure: ESOPHAGOGASTRODUODENOSCOPY (EGD) WITH PROPOFOL;  Surgeon: Arta Silence, MD;  Location: [facility];  Service: Gastroenterology;  Laterality: Bilateral;   lumbar decompression fusion     TONSILLECTOMY     Patient Active Problem List   Diagnosis Date Noted   Eye redness 01/28/2021   Essential tremor 04/10/2020   Calciphylaxis 08/03/2018   Flatfoot 04/01/2018   Chronic pain syndrome 11/12/2017   Neuropathic pain 11/12/2017   COPD, moderate ([place]) 10/23/2017   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   Open abdominal wall wound 10/23/2017   Colitis 10/23/2017   Normocytic anemia 10/23/2017   Moderate major depression, single episode ([place]) 07/16/2017   Encounter for peritoneal dialysis catheter insertion ([place]) 06/18/2017   Panniculitis  04/28/2017   Long term current use of anticoagulant 01/17/2016   Factor V Leiden mutation ([place]) 01/17/2016   Axillary vein thrombosis ([place]) 08/16/2015   OSA on CPAP 03/29/2015   Factor 5 Leiden mutation, heterozygous ([place]) 03/29/2015   Insomnia 03/29/2015   Polyneuropathy 03/29/2015   Restless leg 03/29/2015   Gait disturbance 03/29/2015   Benign hypertension with end-stage renal disease ([place]) 03/01/2015   Asthma-COPD overlap syndrome 08/19/2013   Morbid (severe) obesity due to excess calories ([place]) 08/19/2013   Morbid obesity with BMI of 45.0-49.9, adult ([place]) 08/19/2013   End-stage renal disease on hemodialysis ([place]) 08/18/2013    ONSET DATE: 04/22/22  REFERRING DIAG: G62.9, R26.9  THERAPY DIAG:  Difficulty in walking, not elsewhere classified  Muscle weakness (generalized)  Unsteadiness on feet  Rationale for Evaluation and Treatment Rehabilitation  SUBJECTIVE:  SUBJECTIVE STATEMENT:   "I feel pretty good today actuall" Pt accompanied by: self  PERTINENT HISTORY: lumbar decompression fusion, COPD, CKD, neuropathy, dialysis   PAIN:  Are you having pain? Yes: NPRS scale: 2/10 Pain location: knees, wrist, low back Pain description: toothachey grinding pain Aggravating factors: bending knees trying straighten knees out all the way  Relieving factors: staying off of knees   PRECAUTIONS: Fall  WEIGHT BEARING RESTRICTIONS No  FALLS: Has patient fallen in last 6 months? No  LIVING ENVIRONMENT: Lives with: lives alone Lives in: House/apartment Stairs: No Has following equipment at home: Single point cane and Environmental consultant - 4 wheeled  PLOF: Independent  PATIENT GOALS walk better, balance better, strengthen my legs   OBJECTIVE:    COGNITION: Overall cognitive status: Within  functional limits for tasks assessed   SENSATION: WFL   EDEMA:  Circumferential: patient has lymphedema in BLE and wears bandages   MUSCLE LENGTH: Hamstrings: tightness bilaterally, limited due to bandages and swelling from lymphedema   POSTURE: rounded shoulders, forward head, increased thoracic kyphosis, and flexed trunk   LOWER EXTREMITY ROM:   WFL, limited knee extension bilaterally due to calciphylaxis and lymphedema    LOWER EXTREMITY MMT:    MMT Right Eval Left Eval Right  06/19/22 Left 06/19/22 Right 07/08/22 Left  07/08/22 Right 08/21/22 Left 08/21/22  Hip flexion 4 4- 4+ 4 4+ 4+ 4+ 5  Hip extension          Hip abduction _0 Hip adduction _1 Hip internal rotation          Hip external rotation          Knee flexion _2 4+ _3 Knee extension 3+ _4 grinding pain 3+ grinding pain 4+  4 grinding pain  Ankle dorsiflexion 5 5        Ankle plantarflexion 5 5        Ankle inversion          Ankle eversion          (Blank rows = not tested)  TRANSFERS: Assistive device utilized: Environmental consultant - 4 wheeled  Sit to stand: Complete Independence Stand to sit: Modified independence Chair to chair: Modified independence   GAIT: Gait pattern: step to pattern, decreased step length- Right, decreased step length- Left, decreased stride length, decreased trunk rotation, trunk flexed, wide BOS, poor foot clearance- Right, and poor foot clearance- Left Distance walked: in clinic distances  Assistive device utilized: Environmental consultant - 4 wheeled Level of assistance: Complete Independence Comments: primarily using rollator to ambulate but uses SPC for short distances   FUNCTIONAL TESTs:  5 times sit to stand: 18 seconds on 06/26/22 Timed up and go (TUG): 15.96s  TODAY'S TREATMENT:  08/26/22 NuStep L5 x 6 min Seated Crunches against BOSU Gait with SPC 4 laps 437f   S2S holding red ball 3x5 Standing shoulder Ext red 2x15 Standing rows green  2x15 Hamstring curls green   08/21/22 NuStep L5 x 6 min  Gait with SPC 2 laps 2438f x 3  stopped due to knee pain Seated rows green 2x15 Hip add ball squeeze 2x10 S2S 4x5    08/19/22 NuStep L5 x8m67m Heel taps 4" holding on to rollator, tried w/o but unable to do STS on airex 4x5 on elevated mat table  Walking with cane 3 laps  blackTB ext 2x10  08/14/22 Nustep L 6 8 min ( pt really pushing) Up and down 3 4 inch step with SPC and rail, step too and defiante need of rail Up and down 2 6 inch steps with SPC and rail SBA with pain and compensations with turning SW dt pain Seated Tband- trunk flex and ext 2 sets 10, shld row and shld ext 2 sets 10 Step ups 2 sets 5 4 inch cane and SPC with cuing to decrease compensation Alt step tap 20 x with spc and rail 20x then 20x with just spc- CGA ,much harder for pt Amb with SPC at end of session  3 laps but very fatigued    08/12/22 L 6 7 min 4# LAQ 2 sets 10 Blue tband HS curl 2 sets 10 Hip flex into abd 4# 2 sets 10 3 laps 350 feet SPC SBA 2 x STS with wt ball press 10 x Seated crunches 15 x Wt ball trunk rotation 10 x each   07/31/22 NuStep L5 x 7 min Attempted to ambulate with SPC but unable to due to increase pain. Gait with rollator 120 ft S2S 3x5  Standing rows green 2x15 Standing shoulder Ext 2x12 Gait w/ SPC 120 feet one lap Gait w/ rollator 227f Seated crunches  07/29/22 Nustep L 5 7 min Gait w/ SPC 3687fthree laps Seated rest Gait w/ SPC 36025fhree laps STS without UE 2 sets 5 3# LAQ 2 sets 10 HHA standing 3#  hip flex,ext and abd alt 20 x  ( tried marching but too much pain) Standing rows green 2x15 Standing shoulder Ext 2x12     PATIENT EDUCATION: Education details: POC and HEP Person educated: Patient Education method: Explanation Education comprehension: verbalized understanding   HOME EXERCISE PROGRAM: Access Code: [email or web address] URL: [email or web address] Date:  05/22/2022 Prepared by: MonAndris Baumannxercises - Seated Hip Abduction with Resistance  - 2 x daily - 7 x weekly - 2 sets - 10 reps - Seated Hip Adduction Isometrics with Ball  - 2 x daily - 7 x weekly - 2 sets - 10 reps - Standing March with Counter Support  - 2 x daily - 7 x weekly - 2 sets - 10 reps    GOALS: Goals reviewed with patient? Yes  SHORT TERM GOALS: Target date: 07/03/22  Patient will be independent with initial HEP. Goal status: Met  2.  Patient will be educated on strategies to decrease risk of falls.  Goal status: Met   LONG TERM GOALS: Target date: 09/25/22  Patient will be independent with advanced/ongoing HEP to improve outcomes and carryover.  Goal status: progressing as pt gets stronger 08/14/22  2.  Patient will be able to ambulate 600' with LRAD with good safety to access community.  Baseline: using rollator Goal status: [id number] Progressing 07/08/22  07/17/22 480 feet with SPC 08/14/22  SPC 400 feet supervsion  3.  Patient will be able to step up/down curb safely with LRAD for safety with community ambulation.  Baseline: unable to navigate curbs Goal status: progressing 08/14/22   4.  Patient will demonstrate improved functional LE strength as demonstrated by increase in 1 muscle grade in all weak groups. Goal status: Met but with pain when testing quads 08/21/22.   5.  Patient will demonstrate decreased fall risk by scoring < 15 sec on 5xSTS. Baseline: 18.99s Goal status: 13.66s Met 07/08/22  6.  Patient will demonstrate decreased fall risk by scoring < 10 sec on TUG. Baseline: 15.96s Goal  status: Met 10.06 06/19/22    07/17/22 no AD 16 sec, with SPC 11.92 sec progressing ASSESSMENT:  CLINICAL IMPRESSION:  Pt continues to report PT is really helping and would like to continue. She enters today stating it was a good day, increasing her single  gait trial distance. Heavy forward lean present with sit to stands. Pt did well maintaining stability with  standing rows and extensions.  REHAB POTENTIAL: Good  CLINICAL DECISION MAKING: Stable/uncomplicated  EVALUATION COMPLEXITY: Low  PLAN: PT FREQUENCY: 2x/week  PT DURATION: 12 weeks  PLANNED INTERVENTIONS: Therapeutic exercises, Therapeutic activity, Neuromuscular re-education, Balance training, Gait training, Patient/Family education, Self Care, Joint mobilization, Stair training, Cryotherapy, Moist heat, Manual lymph drainage, Compression bandaging, Ionotophoresis 70m/ml Dexamethasone, and Manual therapy  PLAN FOR NEXT SESSION: gait progression, stamina and community reentry.    RScot Jun PTA 08/26/2022, 10:04 AM  [place] [address]Phone: [phone]  Fax:  [phone]

## 2022-08-28 ENCOUNTER — Ambulatory Visit: Payer: Medicare Other | Admitting: Physical Therapy

## 2022-08-28 ENCOUNTER — Inpatient Hospital Stay: Payer: Medicare Other

## 2022-08-28 ENCOUNTER — Inpatient Hospital Stay: Payer: Medicare Other | Admitting: Hematology & Oncology

## 2022-08-28 ENCOUNTER — Encounter: Payer: Self-pay | Admitting: Physical Therapy

## 2022-08-28 DIAGNOSIS — R2689 Other abnormalities of gait and mobility: Secondary | ICD-10-CM

## 2022-08-28 DIAGNOSIS — M6281 Muscle weakness (generalized): Secondary | ICD-10-CM

## 2022-08-28 DIAGNOSIS — R262 Difficulty in walking, not elsewhere classified: Secondary | ICD-10-CM

## 2022-08-28 DIAGNOSIS — R2681 Unsteadiness on feet: Secondary | ICD-10-CM

## 2022-08-28 NOTE — Therapy (Signed)
OUTPATIENT PHYSICAL THERAPY NEURO TREATMENT     Patient Name: Whitney Chavez MRN: [id number] DOB:1956/01/21, 66 y.o., female Today's Date: 08/28/2022   PCP: Kathyrn Lass REFERRING PROVIDER: Arlice Colt   PT End of Session - 08/28/22 1143     Visit Number 22    Date for PT Re-Evaluation 09/25/22    Authorization Type Medicare    PT Start Time 9675    PT Stop Time 1230    PT Time Calculation (min) 45 min                 Past Medical History:  Diagnosis Date   Asthma    Axillary vein thrombosis ([place]) 08/16/2015   Calciphylaxis    Chronic kidney disease    Colitis    COPD (chronic obstructive pulmonary disease) (HCC)    Factor V Leiden (HCC)    Hypertension    Lymphedema    legs   MRSA (methicillin resistant staph aureus) culture positive    Neuropathy    Obstructive sleep apnea 03/29/2015   OSA on CPAP    Vision abnormalities    Past Surgical History:  Procedure Laterality Date   APPLICATION OF WOUND VAC     AV FISTULA PLACEMENT Left    ESOPHAGOGASTRODUODENOSCOPY (EGD) WITH PROPOFOL Bilateral 03/19/2022   Procedure: ESOPHAGOGASTRODUODENOSCOPY (EGD) WITH PROPOFOL;  Surgeon: Arta Silence, MD;  Location: [facility];  Service: Gastroenterology;  Laterality: Bilateral;   lumbar decompression fusion     TONSILLECTOMY     Patient Active Problem List   Diagnosis Date Noted   Eye redness 01/28/2021   Essential tremor 04/10/2020   Calciphylaxis 08/03/2018   Flatfoot 04/01/2018   Chronic pain syndrome 11/12/2017   Neuropathic pain 11/12/2017   COPD, moderate ([place]) 10/23/2017   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   Open abdominal wall wound 10/23/2017   Colitis 10/23/2017   Normocytic anemia 10/23/2017   Moderate major depression, single episode ([place]) 07/16/2017   Encounter for peritoneal dialysis catheter insertion ([place]) 06/18/2017   Panniculitis 04/28/2017   Long term current use of anticoagulant 01/17/2016   Factor V Leiden mutation ([place]) 01/17/2016    Axillary vein thrombosis ([place]) 08/16/2015   OSA on CPAP 03/29/2015   Factor 5 Leiden mutation, heterozygous ([place]) 03/29/2015   Insomnia 03/29/2015   Polyneuropathy 03/29/2015   Restless leg 03/29/2015   Gait disturbance 03/29/2015   Benign hypertension with end-stage renal disease ([place]) 03/01/2015   Asthma-COPD overlap syndrome 08/19/2013   Morbid (severe) obesity due to excess calories ([place]) 08/19/2013   Morbid obesity with BMI of 45.0-49.9, adult ([place]) 08/19/2013   End-stage renal disease on hemodialysis ([place]) 08/18/2013    ONSET DATE: 04/22/22  REFERRING DIAG: G62.9, R26.9  THERAPY DIAG:  Difficulty in walking, not elsewhere classified  Muscle weakness (generalized)  Unsteadiness on feet  Other abnormalities of gait and mobility  Rationale for Evaluation and Treatment Rehabilitation  SUBJECTIVE:  SUBJECTIVE STATEMENT:   "I am good today"  PERTINENT HISTORY: lumbar decompression fusion, COPD, CKD, neuropathy, dialysis   PAIN:  Are you having pain? Yes: NPRS scale: 2/10 Pain location: knees, wrist, low back Pain description: toothachey grinding pain Aggravating factors: bending knees trying straighten knees out all the way  Relieving factors: staying off of knees   PRECAUTIONS: Fall  WEIGHT BEARING RESTRICTIONS No  FALLS: Has patient fallen in last 6 months? No  LIVING ENVIRONMENT: Lives with: lives alone Lives in: House/apartment Stairs: No Has following equipment at home: Single point cane and Environmental consultant - 4 wheeled  PLOF: Independent  PATIENT GOALS walk better, balance better, strengthen my legs   OBJECTIVE:    COGNITION: Overall cognitive status: Within functional limits for tasks assessed   SENSATION: WFL   EDEMA:  Circumferential: patient has lymphedema in BLE  and wears bandages   MUSCLE LENGTH: Hamstrings: tightness bilaterally, limited due to bandages and swelling from lymphedema   POSTURE: rounded shoulders, forward head, increased thoracic kyphosis, and flexed trunk   LOWER EXTREMITY ROM:   WFL, limited knee extension bilaterally due to calciphylaxis and lymphedema    LOWER EXTREMITY MMT:    MMT Right Eval Left Eval Right  06/19/22 Left 06/19/22 Right 07/08/22 Left  07/08/22 Right 08/21/22 Left 08/21/22  Hip flexion 4 4- 4+ 4 4+ 4+ 4+ 5  Hip extension          Hip abduction _0 Hip adduction _1 Hip internal rotation          Hip external rotation          Knee flexion _2 4+ _3 Knee extension 3+ _4 grinding pain 3+ grinding pain 4+  4 grinding pain  Ankle dorsiflexion 5 5        Ankle plantarflexion 5 5        Ankle inversion          Ankle eversion          (Blank rows = not tested)  TRANSFERS: Assistive device utilized: Environmental consultant - 4 wheeled  Sit to stand: Complete Independence Stand to sit: Modified independence Chair to chair: Modified independence   GAIT: Gait pattern: step to pattern, decreased step length- Right, decreased step length- Left, decreased stride length, decreased trunk rotation, trunk flexed, wide BOS, poor foot clearance- Right, and poor foot clearance- Left Distance walked: in clinic distances  Assistive device utilized: Environmental consultant - 4 wheeled Level of assistance: Complete Independence Comments: primarily using rollator to ambulate but uses SPC for short distances   FUNCTIONAL TESTs:  5 times sit to stand: 18 seconds on 06/26/22 Timed up and go (TUG): 15.96s  TODAY'S TREATMENT:  08/28/22 NuStep L5 x 6 min Seated Crunches against BOSU Gait with SPC 4 laps 467f   rows green 2x15 Shoulder Ext standing bleen 2x15 Gait with SPC1 lap 1275f  Hamstring curls green  S2S holding 2x5  08/26/22 NuStep L5 x 6 min Seated Crunches against BOSU Gait with SPC 4 laps  [id number] S2S holding red ball 3x5 Standing shoulder Ext red 2x15 Standing rows green 2x15 Hamstring curls green   08/21/22 NuStep L5 x 6 min  Gait with SPC 2 laps [id number] 3  stopped due to knee pain Seated rows green 2x15 Hip add ball squeeze 2x10 S2S 4x5    08/19/22  NuStep L5 x46mns Heel taps 4" holding on to rollator, tried w/o but unable to do STS on airex 4x5 on elevated mat table  Walking with cane 3 laps  blackTB ext 2x10   08/14/22 Nustep L 6 8 min ( pt really pushing) Up and down 3 4 inch step with SPC and rail, step too and defiante need of rail Up and down 2 6 inch steps with SPC and rail SBA with pain and compensations with turning SW dt pain Seated Tband- trunk flex and ext 2 sets 10, shld row and shld ext 2 sets 10 Step ups 2 sets 5 4 inch cane and SPC with cuing to decrease compensation Alt step tap 20 x with spc and rail 20x then 20x with just spc- CGA ,much harder for pt Amb with SPC at end of session  3 laps but very fatigued    08/12/22 L 6 7 min 4# LAQ 2 sets 10 Blue tband HS curl 2 sets 10 Hip flex into abd 4# 2 sets 10 3 laps 350 feet SPC SBA 2 x STS with wt ball press 10 x Seated crunches 15 x Wt ball trunk rotation 10 x each   07/31/22 NuStep L5 x 7 min Attempted to ambulate with SPC but unable to due to increase pain. Gait with rollator 120 ft S2S 3x5  Standing rows green 2x15 Standing shoulder Ext 2x12 Gait w/ SPC 120 feet one lap Gait w/ rollator 2050fSeated crunches  07/29/22 Nustep L 5 7 min Gait w/ SPC [id number] laps Seated rest Gait w/ SPC [id number] laps STS without UE 2 sets 5 3# LAQ 2 sets 10 HHA standing 3#  hip flex,ext and abd alt 20 x  ( tried marching but too much pain) Standing rows green 2x15 Standing shoulder Ext 2x12     PATIENT EDUCATION: Education details: POC and HEP Person educated: Patient Education method: Explanation Education comprehension: verbalized understanding   HOME EXERCISE  PROGRAM: Access Code: [email or web address] URL: [email or web address] Date: 05/22/2022 Prepared by: MonaAndris Baumannercises - Seated Hip Abduction with Resistance  - 2 x daily - 7 x weekly - 2 sets - 10 reps - Seated Hip Adduction Isometrics with Ball  - 2 x daily - 7 x weekly - 2 sets - 10 reps - Standing March with Counter Support  - 2 x daily - 7 x weekly - 2 sets - 10 reps    GOALS: Goals reviewed with patient? Yes  SHORT TERM GOALS: Target date: 07/03/22  Patient will be independent with initial HEP. Goal status: Met  2.  Patient will be educated on strategies to decrease risk of falls.  Goal status: Met   LONG TERM GOALS: Target date: 09/25/22  Patient will be independent with advanced/ongoing HEP to improve outcomes and carryover.  Goal status: progressing as pt gets stronger 08/14/22  2.  Patient will be able to ambulate 600' with LRAD with good safety to access community.  Baseline: using rollator Goal status: 360ft45f Progressing 07/08/22  07/17/22 480 feet with SPC 08/14/22  SPC 400 feet supervsion  3.  Patient will be able to step up/down curb safely with LRAD for safety with community ambulation.  Baseline: unable to navigate curbs Goal status: progressing 08/14/22   4.  Patient will demonstrate improved functional LE strength as demonstrated by increase in 1 muscle grade in all weak groups. Goal status: Met but with pain when testing quads 08/21/22.   5.  Patient will demonstrate  decreased fall risk by scoring < 15 sec on 5xSTS. Baseline: 18.99s Goal status: 13.66s Met 07/08/22  6.  Patient will demonstrate decreased fall risk by scoring < 10 sec on TUG. Baseline: 15.96s Goal status: Met 10.06 06/19/22    07/17/22 no AD 16 sec, with SPC 11.92 sec progressing ASSESSMENT:  CLINICAL IMPRESSION:  Pt continues to report PT is really helping and would like to continue. Again she enters today stating it was a good day, Good carryover form last session considering  gait tolerance. Tactile cue for good scapular squeeze needed with seated rows.   REHAB POTENTIAL: Good  CLINICAL DECISION MAKING: Stable/uncomplicated  EVALUATION COMPLEXITY: Low  PLAN: PT FREQUENCY: 2x/week  PT DURATION: 12 weeks  PLANNED INTERVENTIONS: Therapeutic exercises, Therapeutic activity, Neuromuscular re-education, Balance training, Gait training, Patient/Family education, Self Care, Joint mobilization, Stair training, Cryotherapy, Moist heat, Manual lymph drainage, Compression bandaging, Ionotophoresis 2m/ml Dexamethasone, and Manual therapy  PLAN FOR NEXT SESSION: gait progression, stamina and community reentry.    RScot Jun PTA 08/28/2022, 11:44 AM  [facility] [place] [address]Phone: [phone]  Fax:  [phone]

## 2022-09-01 ENCOUNTER — Encounter: Payer: Self-pay | Admitting: Family

## 2022-09-01 ENCOUNTER — Inpatient Hospital Stay: Payer: Medicare Other | Attending: Hematology & Oncology | Admitting: Family

## 2022-09-01 ENCOUNTER — Inpatient Hospital Stay: Payer: Medicare Other

## 2022-09-01 VITALS — BP 98/53 | HR 96 | Temp 98.4°F | Resp 18 | Wt 191.8 lb

## 2022-09-01 DIAGNOSIS — Z86718 Personal history of other venous thrombosis and embolism: Secondary | ICD-10-CM | POA: Insufficient documentation

## 2022-09-01 DIAGNOSIS — I82A19 Acute embolism and thrombosis of unspecified axillary vein: Secondary | ICD-10-CM

## 2022-09-01 DIAGNOSIS — D649 Anemia, unspecified: Secondary | ICD-10-CM

## 2022-09-01 DIAGNOSIS — D6851 Activated protein C resistance: Secondary | ICD-10-CM | POA: Insufficient documentation

## 2022-09-01 DIAGNOSIS — D509 Iron deficiency anemia, unspecified: Secondary | ICD-10-CM

## 2022-09-01 DIAGNOSIS — Z7901 Long term (current) use of anticoagulants: Secondary | ICD-10-CM | POA: Diagnosis not present

## 2022-09-01 DIAGNOSIS — R112 Nausea with vomiting, unspecified: Secondary | ICD-10-CM | POA: Diagnosis not present

## 2022-09-01 DIAGNOSIS — Z992 Dependence on renal dialysis: Secondary | ICD-10-CM | POA: Diagnosis not present

## 2022-09-01 DIAGNOSIS — R609 Edema, unspecified: Secondary | ICD-10-CM | POA: Insufficient documentation

## 2022-09-01 DIAGNOSIS — R790 Abnormal level of blood mineral: Secondary | ICD-10-CM | POA: Insufficient documentation

## 2022-09-01 LAB — CBC WITH DIFFERENTIAL (CANCER CENTER ONLY)
Abs Immature Granulocytes: 0.02 10*3/uL (ref 0.00–0.07)
Basophils Absolute: 0.1 10*3/uL (ref 0.0–0.1)
Basophils Relative: 1 %
Eosinophils Absolute: 0.3 10*3/uL (ref 0.0–0.5)
Eosinophils Relative: 5 %
HCT: 36.3 % (ref 36.0–46.0)
Hemoglobin: 11.2 g/dL — ABNORMAL LOW (ref 12.0–15.0)
Immature Granulocytes: 0 %
Lymphocytes Relative: 15 %
Lymphs Abs: 0.9 10*3/uL (ref 0.7–4.0)
MCH: 27.4 pg (ref 26.0–34.0)
MCHC: 30.9 g/dL (ref 30.0–36.0)
MCV: 88.8 fL (ref 80.0–100.0)
Monocytes Absolute: 0.4 10*3/uL (ref 0.1–1.0)
Monocytes Relative: 6 %
Neutro Abs: 4.2 10*3/uL (ref 1.7–7.7)
Neutrophils Relative %: 73 %
Platelet Count: 149 10*3/uL — ABNORMAL LOW (ref 150–400)
RBC: 4.09 MIL/uL (ref 3.87–5.11)
RDW: 17.9 % — ABNORMAL HIGH (ref 11.5–15.5)
WBC Count: 5.8 10*3/uL (ref 4.0–10.5)
nRBC: 0 % (ref 0.0–0.2)

## 2022-09-01 LAB — RETICULOCYTES
Immature Retic Fract: 8.3 % (ref 2.3–15.9)
RBC.: 4.03 MIL/uL (ref 3.87–5.11)
Retic Count, Absolute: 47.2 10*3/uL (ref 19.0–186.0)
Retic Ct Pct: 1.2 % (ref 0.4–3.1)

## 2022-09-01 LAB — IRON AND IRON BINDING CAPACITY (CC-WL,HP ONLY)
Iron: 86 ug/dL (ref 28–170)
Saturation Ratios: 55 % — ABNORMAL HIGH (ref 10.4–31.8)
TIBC: 155 ug/dL — ABNORMAL LOW (ref 250–450)
UIBC: 69 ug/dL — ABNORMAL LOW (ref 148–442)

## 2022-09-01 LAB — CMP (CANCER CENTER ONLY)
ALT: 5 U/L (ref 0–44)
AST: 15 U/L (ref 15–41)
Albumin: 4.5 g/dL (ref 3.5–5.0)
Alkaline Phosphatase: 114 U/L (ref 38–126)
Anion gap: 19 — ABNORMAL HIGH (ref 5–15)
BUN: 15 mg/dL (ref 8–23)
CO2: 28 mmol/L (ref 22–32)
Calcium: 9.3 mg/dL (ref 8.9–10.3)
Chloride: 96 mmol/L — ABNORMAL LOW (ref 98–111)
Creatinine: 3.34 mg/dL (ref 0.44–1.00)
GFR, Estimated: 15 mL/min — ABNORMAL LOW (ref >60)
Glucose, Bld: 119 mg/dL — ABNORMAL HIGH (ref 70–99)
Potassium: 4.4 mmol/L (ref 3.5–5.1)
Sodium: 143 mmol/L (ref 135–145)
Total Bilirubin: 0.5 mg/dL (ref 0.3–1.2)
Total Protein: 7.5 g/dL (ref 6.5–8.1)

## 2022-09-01 LAB — LACTATE DEHYDROGENASE: LDH: 144 U/L (ref 98–192)

## 2022-09-01 LAB — FERRITIN: Ferritin: 1172 ng/mL — ABNORMAL HIGH (ref 11–307)

## 2022-09-01 NOTE — Progress Notes (Signed)
Critical result of creatinine 3.34 received from [place] in lab. Lottie Dawson NP aware and no new orders received.

## 2022-09-01 NOTE — Progress Notes (Signed)
Hematology and Oncology Follow Up Visit  Whitney Chavez [id number] May 23, 1956 66 y.o. 09/01/2022   Principle Diagnosis:  Recurrent thromboembolic disease Heterozygous factor V Leiden mutation Prothrombin II gene mutation Calciphylaxis   Current Therapy:        Eliquis 5 mg PO BID   Interim History:  Ms. Scheu is here today for follow-up. She is doing well and has no complaints at this time.  She states that dialysis is going well. She went this morning.  She has some fluid retention in her legs and wraps her legs or wears her compression stockings daily for added support. She does not make urine.   No blood loss noted. No abnormal bruising, no petechiae. Appetite comes and goes. She has occasional episodes of n/v and states that her work up with GI so far has negative.  She does her best to hydrate properly on fluid restrictions. Her weight today is 191 lbs.  No fever, chills, cough, rash, dizziness, SOB, chest pain or changes in bowel habits  No numbness or tingling in her extremities at this time.  No falls or syncope reported.   ECOG Performance Status: 1 - Symptomatic but completely ambulatory  Medications:  Allergies as of 09/01/2022       Reactions   Estrogens Other (See Comments)   Blood clots   Other Other (See Comments)   Unable to take birth control pills due to clotting disorder/fim   Prednisone Other (See Comments)   Facial edema - has had this 2020 and had no difficulty "can take in small doses"   Progesterone Other (See Comments)   Blood clots   Propofol Other (See Comments)   Became disinhibited and exacerbated restless leg syndrome during EGD/colonoscopy. Patient has requested an alternate form of sedation during future procedures.    Lisinopril Cough   Clotrimazole Rash   Tape Rash   Please use paper tape        Medication List        Accurate as of September 01, 2022  1:07 PM. If you have any questions, ask your nurse or doctor.           acetaminophen 500 MG tablet Commonly known as: TYLENOL Take 500 mg by mouth every 6 (six) hours as needed for moderate pain.   amiodarone 200 MG tablet Commonly known as: PACERONE Take 200 mg by mouth daily.   apixaban 5 MG Tabs tablet Commonly known as: Eliquis Take 1 tablet (5 mg total) by mouth 2 (two) times daily.   atorvastatin 20 MG tablet Commonly known as: LIPITOR Take 20 mg by mouth every evening.   budesonide-formoterol 160-4.5 MCG/ACT inhaler Commonly known as: Symbicort Inhale 2 puffs into the lungs in the morning and at bedtime.   cetirizine 10 MG tablet Commonly known as: ZYRTEC Take 10 mg by mouth daily.   Epogen 2000 UNIT/ML injection Generic drug: epoetin alfa See admin instructions.   gabapentin 100 MG capsule Commonly known as: NEURONTIN Take 1 capsule (100 mg total) by mouth 4 (four) times daily.   heparin 5000 UNIT/ML injection Inject 1 mL (5,000 Units total) into the skin 2 (two) times daily. To be given subcutaneously for 3 days prior to surgery on July 7th.   Incruse Ellipta 62.5 MCG/ACT Aepb Generic drug: umeclidinium bromide Inhale 1 puff into the lungs daily.   lanthanum 1000 MG chewable tablet Commonly known as: FOSRENOL Chew 500-1,000 mg by mouth See admin instructions. Take 1000 mg with each meal and 500  mg with each snack   levalbuterol 45 MCG/ACT inhaler Commonly known as: XOPENEX HFA Inhale 2 puffs into the lungs every 6 (six) hours as needed for wheezing.   Medical Compression Stockings Misc by Does not apply route. Leg wraps.   omeprazole 20 MG capsule Commonly known as: PRILOSEC Take 20 mg by mouth daily as needed (acid reflux). Rarely takes   ondansetron 4 MG disintegrating tablet Commonly known as: ZOFRAN-ODT Take 4 mg by mouth every 8 (eight) hours as needed for nausea or vomiting.   PARSABIV IV Inject 1 Dose into the vein 3 (three) times a week. Receives at Dialysis   PRESCRIPTION MEDICATION Inhale into the  lungs at bedtime. CPAP with nasal pillow   RENAL VITAMIN PO Take 1 tablet by mouth daily.   rOPINIRole 2 MG tablet Commonly known as: REQUIP Take 1 tablet (2 mg total) by mouth at bedtime.   rOPINIRole 0.5 MG tablet Commonly known as: REQUIP TAKE 1-2 TABLETS BY MOUTH IN THE MORNING.   SODIUM THIOSULFATE IV Inject 1 Dose into the vein 3 (three) times a week. Receives at Dialysis   sulfaSALAzine 500 MG tablet Commonly known as: AZULFIDINE Take 1,000 mg by mouth 2 (two) times daily.   traMADol 50 MG tablet Commonly known as: ULTRAM Take 1 tablet (50 mg total) by mouth 2 (two) times daily as needed (pain).   triamcinolone cream 0.1 % Commonly known as: KENALOG Apply 1 Application topically daily as needed (itching).        Allergies:  Allergies  Allergen Reactions   Estrogens Other (See Comments)    Blood clots   Other Other (See Comments)    Unable to take birth control pills due to clotting disorder/fim   Prednisone Other (See Comments)    Facial edema - has had this 2020 and had no difficulty "can take in small doses"   Progesterone Other (See Comments)    Blood clots   Propofol Other (See Comments)    Became disinhibited and exacerbated restless leg syndrome during EGD/colonoscopy. Patient has requested an alternate form of sedation during future procedures.    Lisinopril Cough   Clotrimazole Rash   Tape Rash    Please use paper tape    Past Medical History, Surgical history, Social history, and Family History were reviewed and updated.  Review of Systems: All other 10 point review of systems is negative.   Physical Exam:  vitals were not taken for this visit.   Wt Readings from Last 3 Encounters:  04/22/22 197 lb 8 oz (89.6 kg)  03/19/22 195 lb (88.5 kg)  02/25/22 199 lb 8 oz (90.5 kg)    Ocular: Sclerae unicteric, pupils equal, round and reactive to light Ear-nose-throat: Oropharynx clear, dentition fair Lymphatic: No cervical or supraclavicular  adenopathy Lungs no rales or rhonchi, good excursion bilaterally Heart regular rate and rhythm, no murmur appreciated Abd soft, nontender, positive bowel sounds MSK no focal spinal tenderness, no joint edema Neuro: non-focal, well-oriented, appropriate affect Breasts: Deferred   Lab Results  Component Value Date   WBC 5.8 09/01/2022   HGB 11.2 (L) 09/01/2022   HCT 36.3 09/01/2022   MCV 88.8 09/01/2022   PLT 149 (L) 09/01/2022   Lab Results  Component Value Date   FERRITIN 852 (H) 03/12/2021   IRON 49 03/12/2021   TIBC 159 (L) 03/12/2021   UIBC 110 03/12/2021   IRONPCTSAT 31 03/12/2021   Lab Results  Component Value Date   RETICCTPCT 1.2 09/01/2022  RBC 4.09 09/01/2022   RBC 4.03 09/01/2022   No results found for: "KPAFRELGTCHN", "LAMBDASER", "KAPLAMBRATIO" No results found for: "IGGSERUM", "IGA", "IGMSERUM" No results found for: "TOTALPROTELP", "ALBUMINELP", "A1GS", "A2GS", "BETS", "BETA2SER", "GAMS", "MSPIKE", "SPEI"   Chemistry      Component Value Date/Time   NA 137 03/19/2022 0806   NA 144 06/25/2017 1146   K 4.6 03/19/2022 0806   K 4.1 06/25/2017 1146   CL 100 03/19/2022 0806   CL 103 06/25/2017 1146   CO2 26 02/25/2022 0921   CO2 31 06/25/2017 1146   BUN 41 (H) 03/19/2022 0806   BUN 27 (H) 06/25/2017 1146   CREATININE 6.80 (H) 03/19/2022 0806   CREATININE 4.84 (HH) 02/25/2022 0921   CREATININE 4.7 (HH) 06/25/2017 1146      Component Value Date/Time   CALCIUM 9.3 02/25/2022 0921   CALCIUM 9.7 06/25/2017 1146   ALKPHOS 96 02/25/2022 0921   ALKPHOS 60 06/25/2017 1146   AST 16 02/25/2022 0921   ALT 6 02/25/2022 0921   ALT 14 06/25/2017 1146   BILITOT 0.6 02/25/2022 0921       Impression and Plan: Ms. Senseney is a very pleasant 66 yo caucasian female with history of recurrent thrombotic events. She has bother Factor V Leiden as well as the prothrombin II gene mutation.  She is doing well on Eliquis and so far there has been no new recurrence. She will  continue her same regimen.  Follow-up in 6 months.   Lottie Dawson, NP 12/18/20231:07 PM

## 2022-09-02 ENCOUNTER — Ambulatory Visit (INDEPENDENT_AMBULATORY_CARE_PROVIDER_SITE_OTHER): Payer: Medicare Other | Admitting: Pulmonary Disease

## 2022-09-02 ENCOUNTER — Ambulatory Visit: Payer: Medicare Other

## 2022-09-02 ENCOUNTER — Telehealth: Payer: Self-pay

## 2022-09-02 ENCOUNTER — Encounter (HOSPITAL_BASED_OUTPATIENT_CLINIC_OR_DEPARTMENT_OTHER): Payer: Self-pay | Admitting: Pulmonary Disease

## 2022-09-02 VITALS — BP 108/60 | HR 65 | Ht 60.0 in | Wt 193.4 lb

## 2022-09-02 DIAGNOSIS — J4489 Other specified chronic obstructive pulmonary disease: Secondary | ICD-10-CM

## 2022-09-02 NOTE — Telephone Encounter (Signed)
Called and LM informing patient of lab results - ok per DPR. Requested a CB if she had any further questions or concerns.

## 2022-09-02 NOTE — Patient Instructions (Signed)
Asthma-COPD overlap --CONTINUE Symbicort 160-4.5 mcg TWO puffs TWICE a day --CONTINUE Incruse ONE puff a day --CONTINUE Albuterol TWO puffs as needed for shortness of breath or wheezing --ORDER pulmonary function tests  Follow-up with me in 3 months (March) with PFTs prior to visit

## 2022-09-02 NOTE — Progress Notes (Unsigned)
$'@Patient'E$  ID: Whitney Chavez, female    DOB: April 15, 1956, 66 y.o.   MRN: [id number]  Chief Complaint  Patient presents with   Follow-up    No updates    Referring provider: Kathyrn Lass, MD  HPI: [name] is a 66 year old female former smoker (60-pack years), recurrent thromboembolic disease secondary to Factor V Leiden mutation and prothrombin II on anticoagulation, ESRD on HD, lymphedema, OSA and hx calciphylaxis who presents for follow-up for COPD.   Since her last visit she reports that she is on Symbicort and Incruse. She chronic cough with productive sputum whitish. Denies wheezing or shortness of breath. Overall compliant with inhaler. Has not needed emergency inhaler in years. She has had more fatigue with her dialysis sessions lately. Has more nausea later. Followed by [facility] with Dr. Paulita Fujita and work-up has been negative from their standpoint. Appetite poor and has been losing weight. Participating in physical therapy. Once graduated she plans to join [employer]. She wants to be evaluated for kidney transplant.  Past Medical History:  Diagnosis Date   Asthma    Axillary vein thrombosis ([place]) 08/16/2015   Calciphylaxis    Chronic kidney disease    Colitis    COPD (chronic obstructive pulmonary disease) (HCC)    Factor V Leiden (HCC)    Hypertension    Lymphedema    legs   MRSA (methicillin resistant staph aureus) culture positive    Neuropathy    Obstructive sleep apnea 03/29/2015   OSA on CPAP    Vision abnormalities     Outpatient Medications Prior to Visit  Medication Sig Dispense Refill   acetaminophen (TYLENOL) 500 MG tablet Take 500 mg by mouth every 6 (six) hours as needed for moderate pain.     atorvastatin (LIPITOR) 20 MG tablet Take 20 mg by mouth every evening.     B Complex-C-Folic Acid (RENAL VITAMIN PO) Take 1 tablet by mouth daily.     budesonide-formoterol (SYMBICORT) 160-4.5 MCG/ACT inhaler Inhale 2 puffs into the lungs in the morning  and at bedtime. 30.6 g 3   cetirizine (ZYRTEC) 10 MG tablet Take 10 mg by mouth daily.     Elastic Bandages & Supports (MEDICAL COMPRESSION STOCKINGS) MISC by Does not apply route. Leg wraps.     epoetin alfa (EPOGEN) 2000 UNIT/ML injection See admin instructions.     Etelcalcetide HCl (PARSABIV IV) Inject 1 Dose into the vein 3 (three) times a week. Receives at Dialysis     gabapentin (NEURONTIN) 100 MG capsule Take 1 capsule (100 mg total) by mouth 4 (four) times daily. 360 capsule 3   lanthanum (FOSRENOL) 1000 MG chewable tablet Chew 500-1,000 mg by mouth See admin instructions. Take 1000 mg with each meal and 500 mg with each snack     metoprolol succinate (TOPROL-XL) 25 MG 24 hr tablet Take 12.5 mg by mouth daily.     omeprazole (PRILOSEC) 20 MG capsule Take 20 mg by mouth daily as needed (acid reflux). Rarely takes     ondansetron (ZOFRAN-ODT) 4 MG disintegrating tablet Take 4 mg by mouth every 8 (eight) hours as needed for nausea or vomiting.     PRESCRIPTION MEDICATION Inhale into the lungs at bedtime. CPAP with nasal pillow     rOPINIRole (REQUIP) 0.5 MG tablet TAKE 1-2 TABLETS BY MOUTH IN THE MORNING. 180 tablet 4   rOPINIRole (REQUIP) 2 MG tablet Take 1 tablet (2 mg total) by mouth at bedtime. 90 tablet 3  SODIUM THIOSULFATE IV Inject 1 Dose into the vein 3 (three) times a week. Receives at Dialysis     sulfaSALAzine (AZULFIDINE) 500 MG tablet Take 1,000 mg by mouth 2 (two) times daily.     traMADol (ULTRAM) 50 MG tablet Take 1 tablet (50 mg total) by mouth 2 (two) times daily as needed (pain). 60 tablet 3   triamcinolone cream (KENALOG) 0.1 % Apply 1 Application topically daily as needed (itching).     umeclidinium bromide (INCRUSE ELLIPTA) 62.5 MCG/ACT AEPB Inhale 1 puff into the lungs daily. 90 each 1   amiodarone (PACERONE) 200 MG tablet Take 200 mg by mouth daily. (Patient not taking: Reported on 09/01/2022)     apixaban (ELIQUIS) 5 MG TABS tablet Take 1 tablet (5 mg total) by  mouth 2 (two) times daily. 180 tablet 2   heparin 5000 UNIT/ML injection Inject 1 mL (5,000 Units total) into the skin 2 (two) times daily. To be given subcutaneously for 3 days prior to surgery on July 7th. (Patient not taking: Reported on 09/02/2022) 4 mL 0   levalbuterol (XOPENEX HFA) 45 MCG/ACT inhaler Inhale 2 puffs into the lungs every 6 (six) hours as needed for wheezing. 1 Inhaler 12   No facility-administered medications prior to visit.    Review of Systems  Review of Systems  Constitutional:  Negative for chills, diaphoresis, fever, malaise/fatigue and weight loss.  HENT:  Negative for congestion.   Respiratory:  Negative for cough, hemoptysis, sputum production, shortness of breath and wheezing.   Cardiovascular:  Negative for chest pain, palpitations and leg swelling.    Blood pressure 108/60, pulse 65, height 5' (1.524 m), weight 193 lb 6.4 oz (87.7 kg), SpO2 96 %.  Physical Exam: General: Well-appearing, no acute distress HENT: [place], AT Eyes: EOMI, no scleral icterus Respiratory: Clear to auscultation bilaterally.  No crackles, wheezing or rales Cardiovascular: RRR, -M/R/G, no JVD Extremities:-Edema,-tenderness Neuro: AAO x4, CNII-XII grossly intact Psych: Normal mood, normal affect  Data Reviewed:  Imaging CXR 09/02/22 - Mild cardiomegaly. No acute issues  PFTs 07/28/2019 FVC 1.59 (55%), FEV1 1.24 (56%), ratio 78, normal DLCO Moderate obstruction with positive BD response. Curvature to flow volume loop.   12/29/17 PFTs with MCT- FVC 1.73 (59%), FEV1 1.30 (58%), ratio 75 Moderate obstruction with positive hyper-reactive airway/ FEV1 declined by more than 20%  CBC    Component Value Date/Time   WBC 5.8 09/01/2022 1249   WBC 6.8 05/24/2018 2123   RBC 4.09 09/01/2022 1249   RBC 4.03 09/01/2022 1249   HGB 11.2 (L) 09/01/2022 1249   HGB 9.9 (L) 06/25/2017 1146   HCT 36.3 09/01/2022 1249   HCT 32.3 (L) 06/25/2017 1146   PLT 149 (L) 09/01/2022 1249   PLT 202  06/25/2017 1146   MCV 88.8 09/01/2022 1249   MCV 92 06/25/2017 1146   MCH 27.4 09/01/2022 1249   MCHC 30.9 09/01/2022 1249   RDW 17.9 (H) 09/01/2022 1249   RDW 16.4 (H) 06/25/2017 1146   LYMPHSABS 0.9 09/01/2022 1249   LYMPHSABS 1.2 06/25/2017 1146   MONOABS 0.4 09/01/2022 1249   EOSABS 0.3 09/01/2022 1249   EOSABS 0.2 06/25/2017 1146   BASOSABS 0.1 09/01/2022 1249   BASOSABS 0.0 06/25/2017 1146   Absolute eos 09/01/22 - 300   Assessment & Plan:   No problem-specific Assessment & Plan notes found for this encounter.  Moderately severe COPD (FEV1 58%) Asthma-COPD overlap --CONTINUE Symbicort 160-4.5 mcg TWO puffs TWICE a day --CONTINUE Incruse ONE puff a  day --CONTINUE Albuterol TWO puffs as needed for shortness of breath or wheezing --ORDER pulmonary function tests   Immunization History  Administered Date(s) Administered   Influenza-Unspecified 06/22/2015, 06/25/2016, 06/30/2019   Moderna Sars-Covid-2 Vaccination 12/01/2019, 12/29/2019   Pneumococcal Conjugate-13 11/27/2014   Pneumococcal Polysaccharide-23 07/11/2016   Zoster Recombinat (Shingrix) 04/11/2017, 08/17/2017   CT Lung screen - not qualified due to >15 years after smoking  No orders of the defined types were placed in this encounter.  No orders of the defined types were placed in this encounter.  No follow-ups on file.  I have spent a total time of 35-minutes on the day of the appointment including chart review, data review, collecting history, coordinating care and discussing medical diagnosis and plan with the patient/family. Past medical history, allergies, medications were reviewed. Pertinent imaging, labs and tests included in this note have been reviewed and interpreted independently by me.  Rodman Pickle, MD [facility] Pulmonary/Critical Care Medicine 09/02/2022 11:27 AM

## 2022-09-04 ENCOUNTER — Ambulatory Visit: Payer: Medicare Other | Admitting: Physical Therapy

## 2022-09-04 ENCOUNTER — Other Ambulatory Visit (HOSPITAL_BASED_OUTPATIENT_CLINIC_OR_DEPARTMENT_OTHER): Payer: Self-pay

## 2022-09-04 DIAGNOSIS — R2681 Unsteadiness on feet: Secondary | ICD-10-CM

## 2022-09-04 DIAGNOSIS — R262 Difficulty in walking, not elsewhere classified: Secondary | ICD-10-CM

## 2022-09-04 DIAGNOSIS — M6281 Muscle weakness (generalized): Secondary | ICD-10-CM

## 2022-09-04 MED ORDER — BUDESONIDE-FORMOTEROL FUMARATE 160-4.5 MCG/ACT IN AERO: 2.0000 | INHALATION_SPRAY | Freq: Two times a day (BID) | RESPIRATORY_TRACT | 3 refills | Status: DC

## 2022-09-04 NOTE — Therapy (Signed)
OUTPATIENT PHYSICAL THERAPY NEURO TREATMENT     Patient Name: Whitney Chavez MRN: [id number] DOB:[birth year], 66 y.o., female Today's Date: 09/04/2022   PCP: Kathyrn Lass REFERRING PROVIDER: Arlice Colt   PT End of Session - 09/04/22 1058     Visit Number 23    Date for PT Re-Evaluation 09/25/22    Authorization Type Medicare    PT Start Time 1058    PT Stop Time 1143    PT Time Calculation (min) 45 min                 Past Medical History:  Diagnosis Date   Asthma    Axillary vein thrombosis ([place]) 08/16/2015   Calciphylaxis    Chronic kidney disease    Colitis    COPD (chronic obstructive pulmonary disease) ([place])    Factor V Leiden ([place])    Hypertension    Lymphedema    legs   MRSA (methicillin resistant staph aureus) culture positive    Neuropathy    Obstructive sleep apnea 03/29/2015   OSA on CPAP    Vision abnormalities    Past Surgical History:  Procedure Laterality Date   APPLICATION OF WOUND VAC     AV FISTULA PLACEMENT Left    ESOPHAGOGASTRODUODENOSCOPY (EGD) WITH PROPOFOL Bilateral 03/19/2022   Procedure: ESOPHAGOGASTRODUODENOSCOPY (EGD) WITH PROPOFOL;  Surgeon: Arta Silence, MD;  Location: [facility];  Service: Gastroenterology;  Laterality: Bilateral;   lumbar decompression fusion     TONSILLECTOMY     Patient Active Problem List   Diagnosis Date Noted   Eye redness 01/28/2021   Essential tremor 04/10/2020   Calciphylaxis 08/03/2018   Flatfoot 04/01/2018   Chronic pain syndrome 11/12/2017   Neuropathic pain 11/12/2017   COPD, moderate ([place]) 10/23/2017   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   Open abdominal wall wound 10/23/2017   Colitis 10/23/2017   Normocytic anemia 10/23/2017   Moderate major depression, single episode ([place]) 07/16/2017   Encounter for peritoneal dialysis catheter insertion ([place]) 06/18/2017   Panniculitis 04/28/2017   Long term current use of anticoagulant 01/17/2016   Factor V Leiden mutation ([place]) 01/17/2016    Axillary vein thrombosis ([place]) 08/16/2015   OSA on CPAP 03/29/2015   Factor 5 Leiden mutation, heterozygous ([place]) 03/29/2015   Insomnia 03/29/2015   Polyneuropathy 03/29/2015   Restless leg 03/29/2015   Gait disturbance 03/29/2015   Benign hypertension with end-stage renal disease ([place]) 03/01/2015   Asthma-COPD overlap syndrome 08/19/2013   Morbid (severe) obesity due to excess calories ([place]) 08/19/2013   Morbid obesity with BMI of 45.0-49.9, adult ([place]) 08/19/2013   End-stage renal disease on hemodialysis ([place]) 08/18/2013    ONSET DATE: 04/22/22  REFERRING DIAG: G62.9, R26.9  THERAPY DIAG:  Difficulty in walking, not elsewhere classified  Muscle weakness (generalized)  Unsteadiness on feet  Rationale for Evaluation and Treatment Rehabilitation  SUBJECTIVE:  SUBJECTIVE STATEMENT:   I think PT has really helped . I am going to return to [employer]. I think I am ready for D/C  PERTINENT HISTORY: lumbar decompression fusion, COPD, CKD, neuropathy, dialysis   PAIN:  Are you having pain? Yes. BIL Knees 3/10  PRECAUTIONS: Fall  WEIGHT BEARING RESTRICTIONS No  FALLS: Has patient fallen in last 6 months? No  LIVING ENVIRONMENT: Lives with: lives alone Lives in: House/apartment Stairs: No Has following equipment at home: Single point cane and Environmental consultant - 4 wheeled  PLOF: Independent  PATIENT GOALS walk better, balance better, strengthen my legs   OBJECTIVE:    COGNITION: Overall cognitive status: Within functional limits for tasks assessed   SENSATION: WFL   EDEMA:  Circumferential: patient has lymphedema in BLE and wears bandages   MUSCLE LENGTH: Hamstrings: tightness bilaterally, limited due to bandages and swelling from lymphedema   POSTURE: rounded shoulders, forward  head, increased thoracic kyphosis, and flexed trunk   LOWER EXTREMITY ROM:   WFL, limited knee extension bilaterally due to calciphylaxis and lymphedema    LOWER EXTREMITY MMT:    MMT Right Eval Left Eval Right  06/19/22 Left 06/19/22 Right 07/08/22 Left  07/08/22 Right 08/21/22 Left 08/21/22  Hip flexion 4 4- 4+ 4 4+ 4+ 4+ 5  Hip extension          Hip abduction _0 Hip adduction _1 Hip internal rotation          Hip external rotation          Knee flexion _2 4+ _3 Knee extension 3+ _4 grinding pain 3+ grinding pain 4+  4 grinding pain  Ankle dorsiflexion 5 5        Ankle plantarflexion 5 5        Ankle inversion          Ankle eversion          (Blank rows = not tested)  TRANSFERS: Assistive device utilized: Environmental consultant - 4 wheeled  Sit to stand: Complete Independence Stand to sit: Modified independence Chair to chair: Modified independence   GAIT: Gait pattern: step to pattern, decreased step length- Right, decreased step length- Left, decreased stride length, decreased trunk rotation, trunk flexed, wide BOS, poor foot clearance- Right, and poor foot clearance- Left Distance walked: in clinic distances  Assistive device utilized: Environmental consultant - 4 wheeled Level of assistance: Complete Independence Comments: primarily using rollator to ambulate but uses SPC for short distances   FUNCTIONAL TESTs:  5 times sit to stand: 18 seconds on 06/26/22 Timed up and go (TUG): 15.96s  TODAY'S TREATMENT:              09/06/22 Nustep L 6 then down to L 5 7 min Amb 4 laps with SPC SBA with good cadeance, did slow last lap Trunk flex and ext black tband 2 sets 10 STS with wt ball press 10 x Standing shld ext and row blue tband 2 sets 10  08/28/22 NuStep L5 x 6 min Seated Crunches against BOSU Gait with SPC 4 laps 473f   rows green 2x15 Shoulder Ext standing bleen 2x15 Gait with SPC1 lap 1230f  Hamstring curls green  S2S holding  2x5  08/26/22 NuStep L5 x 6 min Seated Crunches against BOSU Gait with SPC 4 laps 48049f S2S holding red ball 3x5  Standing shoulder Ext red 2x15 Standing rows green 2x15 Hamstring curls green   08/21/22 NuStep L5 x 6 min  Gait with SPC 2 laps 225f  x 3  stopped due to knee pain Seated rows green 2x15 Hip add ball squeeze 2x10 S2S 4x5    08/19/22 NuStep L5 x824ms Heel taps 4" holding on to rollator, tried w/o but unable to do STS on airex 4x5 on elevated mat table  Walking with cane 3 laps  blackTB ext 2x10   08/14/22 Nustep L 6 8 min ( pt really pushing) Up and down 3 4 inch step with SPC and rail, step too and defiante need of rail Up and down 2 6 inch steps with SPC and rail SBA with pain and compensations with turning SW dt pain Seated Tband- trunk flex and ext 2 sets 10, shld row and shld ext 2 sets 10 Step ups 2 sets 5 4 inch cane and SPC with cuing to decrease compensation Alt step tap 20 x with spc and rail 20x then 20x with just spc- CGA ,much harder for pt Amb with SPC at end of session  3 laps but very fatigued    08/12/22 L 6 7 min 4# LAQ 2 sets 10 Blue tband HS curl 2 sets 10 Hip flex into abd 4# 2 sets 10 3 laps 350 feet SPC SBA 2 x STS with wt ball press 10 x Seated crunches 15 x Wt ball trunk rotation 10 x each   07/31/22 NuStep L5 x 7 min Attempted to ambulate with SPC but unable to due to increase pain. Gait with rollator 120 ft S2S 3x5  Standing rows green 2x15 Standing shoulder Ext 2x12 Gait w/ SPC 120 feet one lap Gait w/ rollator [id number] crunches  07/29/22 Nustep L 5 7 min Gait w/ SPC [id number] laps Seated rest Gait w/ SPC [id number] laps STS without UE 2 sets 5 3# LAQ 2 sets 10 HHA standing 3#  hip flex,ext and abd alt 20 x  ( tried marching but too much pain) Standing rows green 2x15 Standing shoulder Ext 2x12     PATIENT EDUCATION: Education details: POC and HEP Person educated: Patient Education method:  Explanation Education comprehension: verbalized understanding   HOME EXERCISE PROGRAM: Access Code: [email or web address] URL: [email or web address] Date: 05/22/2022 Prepared by: Mona Andris Baumannrcises - Seated Hip Abduction with Resistance  - 2 x daily - 7 x weekly - 2 sets - 10 reps - Seated Hip Adduction Isometrics with Ball  - 2 x daily - 7 x weekly - 2 sets - 10 reps - Standing March with Counter Support  - 2 x daily - 7 x weekly - 2 sets - 10 reps    GOALS: Goals reviewed with patient? Yes  SHORT TERM GOALS: Target date: 07/03/22  Patient will be independent with initial HEP. Goal status: Met  2.  Patient will be educated on strategies to decrease risk of falls.  Goal status: Met   LONG TERM GOALS: Target date: 09/25/22  Patient will be independent with advanced/ongoing HEP to improve outcomes and carryover.  Goal status: progressing as pt gets stronger 08/14/22  09/04/22 MET  2.  Patient will be able to ambulate 600' with LRAD with good safety to access community.  Baseline: using rollator Goal status: 360ft 74fProgressing 07/08/22  07/17/22 480 feet with SPC 08/14/22  SPC 400 feet supervsion. Progressing 09/04/22 450 feet  3.  Patient will be able to step up/down curb  safely with LRAD for safety with community ambulation.  Baseline: unable to navigate curbs Goal status: progressing 08/14/22 . Progressing 09/04/22  4.  Patient will demonstrate improved functional LE strength as demonstrated by increase in 1 muscle grade in all weak groups. Goal status: Met but with pain when testing quads 08/21/22.   5.  Patient will demonstrate decreased fall risk by scoring < 15 sec on 5xSTS. Baseline: 18.99s Goal status: 13.66s Met 07/08/22  6.  Patient will demonstrate decreased fall risk by scoring < 10 sec on TUG. Baseline: 15.96s Goal status: Met 10.06 06/19/22    07/17/22 no AD 16 sec, with SPC 11.92 sec progressing  09/06/20 Met ASSESSMENT:  CLINICAL IMPRESSION:  Pt  very pleased with PT and feels she is ready for D/C and will transition to [facility]. Goals partially met  REHAB POTENTIAL: Good  CLINICAL DECISION MAKING: Stable/uncomplicated  EVALUATION COMPLEXITY: Low  PLAN: PT FREQUENCY: 2x/week  PT DURATION: 12 weeks  PLANNED INTERVENTIONS: Therapeutic exercises, Therapeutic activity, Neuromuscular re-education, Balance training, Gait training, Patient/Family education, Self Care, Joint mobilization, Stair training, Cryotherapy, Moist heat, Manual lymph drainage, Compression bandaging, Ionotophoresis 27m/ml Dexamethasone, and Manual therapy  PLAN FOR NEXT SESSION: D/C  PHYSICAL THERAPY DISCHARGE SUMMARY   Patient agrees to discharge. Patient goals were met. Patient is being discharged due to being pleased with the current functional level.    Dodi Leu,ANGIE, PTA 09/04/2022, 10:59 AM  [place] [address]Phone: [phone]  Fax:  [address] [address]Phone: [phone]  Fax:  [phone] Patient Details  Name: Whitney TWERSKYMRN: [id number] of Birth: 1Jan 26, 1957Referring Provider:  MKathyrn Lass MD  Encounter Date: 09/04/2022   PLaqueta Carina PTA 09/04/2022, 10:59 AM  [place] [address]Phone: [phone]  Fax:  [phone]

## 2022-09-19 ENCOUNTER — Other Ambulatory Visit: Payer: Self-pay | Admitting: Gastroenterology

## 2022-09-29 ENCOUNTER — Other Ambulatory Visit (HOSPITAL_COMMUNITY): Payer: Self-pay

## 2022-09-29 ENCOUNTER — Other Ambulatory Visit: Payer: Self-pay

## 2022-09-29 DIAGNOSIS — D6851 Activated protein C resistance: Secondary | ICD-10-CM

## 2022-09-29 MED ORDER — HEPARIN SODIUM (PORCINE) 5000 UNIT/ML IJ SOLN
5000.0000 [IU] | Freq: Two times a day (BID) | INTRAMUSCULAR | 0 refills | Status: DC
  Filled 2022-09-29: qty 6, 3d supply, fill #0

## 2022-09-30 ENCOUNTER — Other Ambulatory Visit (HOSPITAL_COMMUNITY): Payer: Self-pay

## 2022-10-07 ENCOUNTER — Other Ambulatory Visit (HOSPITAL_COMMUNITY): Payer: Self-pay

## 2022-10-08 ENCOUNTER — Encounter: Payer: Self-pay | Admitting: *Deleted

## 2022-10-08 NOTE — Progress Notes (Signed)
Received approval from [facility] for Heparin Sodium 5,000 UT.  Case ID [id number] from 09/15/22-10/07/23.

## 2022-10-13 ENCOUNTER — Other Ambulatory Visit: Payer: Self-pay

## 2022-10-13 DIAGNOSIS — D6851 Activated protein C resistance: Secondary | ICD-10-CM

## 2022-10-13 MED ORDER — APIXABAN 5 MG PO TABS: 5.0000 mg | ORAL_TABLET | Freq: Two times a day (BID) | ORAL | 0 refills | Status: AC

## 2022-10-29 ENCOUNTER — Other Ambulatory Visit: Payer: Self-pay | Admitting: Neurology

## 2022-10-30 ENCOUNTER — Ambulatory Visit (INDEPENDENT_AMBULATORY_CARE_PROVIDER_SITE_OTHER): Payer: Medicare Other | Admitting: Neurology

## 2022-10-30 ENCOUNTER — Encounter: Payer: Self-pay | Admitting: Neurology

## 2022-10-30 VITALS — BP 100/65 | HR 71 | Ht 60.0 in | Wt 194.0 lb

## 2022-10-30 DIAGNOSIS — Z992 Dependence on renal dialysis: Secondary | ICD-10-CM

## 2022-10-30 DIAGNOSIS — G2581 Restless legs syndrome: Secondary | ICD-10-CM

## 2022-10-30 DIAGNOSIS — N186 End stage renal disease: Secondary | ICD-10-CM

## 2022-10-30 DIAGNOSIS — G4733 Obstructive sleep apnea (adult) (pediatric): Secondary | ICD-10-CM

## 2022-10-30 DIAGNOSIS — G629 Polyneuropathy, unspecified: Secondary | ICD-10-CM

## 2022-10-30 DIAGNOSIS — R269 Unspecified abnormalities of gait and mobility: Secondary | ICD-10-CM

## 2022-10-30 MED ORDER — GABAPENTIN 100 MG PO CAPS: 100.0000 mg | ORAL_CAPSULE | Freq: Four times a day (QID) | ORAL | 3 refills | Status: DC

## 2022-10-30 MED ORDER — ROPINIROLE HCL 2 MG PO TABS: 2.0000 mg | ORAL_TABLET | Freq: Every day | ORAL | 3 refills | Status: DC

## 2022-10-30 MED ORDER — OXYCODONE HCL 5 MG PO TABS: ORAL_TABLET | ORAL | 0 refills | Status: DC

## 2022-10-30 MED ORDER — ROPINIROLE HCL 0.5 MG PO TABS: ORAL_TABLET | ORAL | 4 refills | Status: DC

## 2022-10-30 NOTE — Progress Notes (Signed)
[facility]  PATIENT: Whitney Chavez DOB: 1956-01-12  REFERRING DOCTOR OR PCP:  none SOURCE: patient and records from [facility]  _________________________________   HISTORICAL  CHIEF COMPLAINT:  Chief Complaint  Patient presents with   Room 10    Pt is here Alone. Pt states that things have been pretty good. Pt states that physical therapy worked great for her.      HISTORY OF PRESENT ILLNESS:  Whitney Chavez is a 67 year old woman with Obstructive sleep apnea , insomnia, restless leg syndrome and polyneuropathy.      Update 10/30/2022 She has OSA and is using CPAP nightly and can' sleep well without it.   Download shows compliance was 93% and efficacy was good (AHI = 1.6).      She uses the [employer] mask (nasal pillows).   She has second machine x 5 years (Airsense 10)  She has more RLS symptoms during HD and at night.  She takes ropinirole 1/2 pill before HD and 1/2 pill afterwards and at bedtime.  That and gabapentin have helped.  Tramadol 50 mg po bid has helped.     She takes gabapentin 300 mg a day (100 in pm and 200 at bedtime) and ropinirole 0.5 mg in pm and 2 mg at bedtime)   She is on HD for ESRD and is trying to get on the transplant list (was able to lose weight).   She takes additional ropinirole 0.5 mg before HD.      Neuropathy pain has done better with the gabapentin and tramadol.     She did PT and felt it helped.   Besides the neuropathy, she also has advanced knee DTR.    With PT  has strengthened some and is doig better with standing up (less time).   Silver sneakers was not covered so she bought a pedal exercise equipment.     She also has a frozen right shoulder that is bothersome at times.      She had calciphylaxis due to HD (x 11+ years)  and required surgery .    This led to CRF/HD.    She has mild EDS.  EPWORTH SLEEPINESS SCALE  On a scale of 0 - 3 what is the chance of dozing:  Sitting and Reading:   3 Watching  TV:    2 Sitting inactive in a public place: 2 Passenger in car for one hour: 2 Lying down to rest in the afternoon: 2 Sitting and talking to someone: 1 Sitting quietly after lunch:  1 In a car, stopped in traffic:  0  Total (out of 24):  13/24   Mild EDS (generally worse on HD days and better on non-HD days)       She has had vomiting but EGD showed no problems.   She is seeing GI.   She takes Zofran on dialysis days.    REVIEW OF SYSTEMS: Constitutional: No fevers, chills, sweats, or change in appetite Eyes: No visual changes, double vision, eye pain Ear, nose and throat: No hearing loss, ear pain, nasal congestion, sore throat Cardiovascular: No chest pain, palpitations Respiratory:  No shortness of breath at rest or with exertion.   No wheezes GastrointestinaI: She has had vomiting, not always associated with dialysis though usually worse on those days Genitourinary:  She is on hemodialysis. Musculoskeletal:  No neck pain, back pain Integumentary: No rash, pruritus, skin lesions Neurological: as above Psychiatric: No depression at this time.  No anxiety  Endocrine: No palpitations, diaphoresis, change in appetite, change in weigh or increased thirst Hematologic/Lymphatic:  No anemia, purpura, petechiae. Allergic/Immunologic: No itchy/runny eyes, nasal congestion, recent allergic reactions, rashes  ALLERGIES: Allergies  Allergen Reactions   Estrogens Other (See Comments)    Blood clots   Other Other (See Comments)    Unable to take birth control pills due to clotting disorder/fim   Prednisone Other (See Comments)    Facial edema - has had this 2020 and had no difficulty "can take in small doses"   Progesterone Other (See Comments)    Blood clots   Propofol Other (See Comments)    Became disinhibited and exacerbated restless leg syndrome during EGD/colonoscopy. Patient has requested an alternate form of sedation during future procedures.    Lisinopril Cough   Clotrimazole  Rash   Tape Rash    Please use paper tape    HOME MEDICATIONS:  Current Outpatient Medications:    acetaminophen (TYLENOL) 500 MG tablet, Take 500 mg by mouth every 6 (six) hours as needed for moderate pain., Disp: , Rfl:    amiodarone (PACERONE) 200 MG tablet, Take 200 mg by mouth daily., Disp: , Rfl:    apixaban (ELIQUIS) 5 MG TABS tablet, Take 1 tablet (5 mg total) by mouth 2 (two) times daily., Disp: 60 tablet, Rfl: 0   atorvastatin (LIPITOR) 20 MG tablet, Take 20 mg by mouth every evening., Disp: , Rfl:    B Complex-C-Folic Acid (RENAL VITAMIN PO), Take 1 tablet by mouth daily., Disp: , Rfl:    budesonide-formoterol (SYMBICORT) 160-4.5 MCG/ACT inhaler, Inhale 2 puffs into the lungs in the morning and at bedtime., Disp: 30.6 g, Rfl: 3   cetirizine (ZYRTEC) 10 MG tablet, Take 10 mg by mouth daily., Disp: , Rfl:    Elastic Bandages & Supports ([place]) MISC, by Does not apply route. Leg wraps., Disp: , Rfl:    epoetin alfa (EPOGEN) 2000 UNIT/ML injection, See admin instructions., Disp: , Rfl:    Etelcalcetide HCl (PARSABIV IV), Inject 1 Dose into the vein 3 (three) times a week. Receives at Dialysis, Disp: , Rfl:    heparin 5000 UNIT/ML injection, Inject 1 mL (5,000 Units total) into the skin 2 (two) times daily. To be given subcutaneously for 3 days prior to surgery on July 7th., Disp: 6 mL, Rfl: 0   lanthanum (FOSRENOL) 1000 MG chewable tablet, Chew 500-1,000 mg by mouth See admin instructions. Take 1000 mg with each meal and 500 mg with each snack, Disp: , Rfl:    levalbuterol (XOPENEX HFA) 45 MCG/ACT inhaler, Inhale 2 puffs into the lungs every 6 (six) hours as needed for wheezing., Disp: 1 Inhaler, Rfl: 12   metoprolol succinate (TOPROL-XL) 25 MG 24 hr tablet, Take 12.5 mg by mouth daily., Disp: , Rfl:    omeprazole (PRILOSEC) 20 MG capsule, Take 20 mg by mouth daily as needed (acid reflux). Rarely takes, Disp: , Rfl:    ondansetron (ZOFRAN-ODT) 4 MG disintegrating  tablet, Take 4 mg by mouth every 8 (eight) hours as needed for nausea or vomiting., Disp: , Rfl:    oxyCODONE (ROXICODONE) 5 MG immediate release tablet, Take one po qd formore severe pain, Disp: 30 tablet, Rfl: 0   PRESCRIPTION MEDICATION, Inhale into the lungs at bedtime. CPAP with nasal pillow, Disp: , Rfl:    SODIUM THIOSULFATE IV, Inject 1 Dose into the vein 3 (three) times a week. Receives at Dialysis, Disp: , Rfl:    sulfaSALAzine (AZULFIDINE) 500 MG  tablet, Take 1,000 mg by mouth 2 (two) times daily., Disp: , Rfl:    traMADol (ULTRAM) 50 MG tablet, TAKE 1 TABLET (50 MG TOTAL) BY MOUTH 2 (TWO) TIMES DAILY AS NEEDED (PAIN)., Disp: 60 tablet, Rfl: 5   triamcinolone cream (KENALOG) 0.1 %, Apply 1 Application topically daily as needed (itching)., Disp: , Rfl:    umeclidinium bromide (INCRUSE ELLIPTA) 62.5 MCG/ACT AEPB, Inhale 1 puff into the lungs daily., Disp: 90 each, Rfl: 1   gabapentin (NEURONTIN) 100 MG capsule, Take 1 capsule (100 mg total) by mouth 4 (four) times daily., Disp: 360 capsule, Rfl: 3   rOPINIRole (REQUIP) 0.5 MG tablet, TAKE 1-2 TABLETS BY MOUTH IN THE MORNING., Disp: 180 tablet, Rfl: 4   rOPINIRole (REQUIP) 2 MG tablet, Take 1 tablet (2 mg total) by mouth at bedtime., Disp: 90 tablet, Rfl: 3  PAST MEDICAL HISTORY: Past Medical History:  Diagnosis Date   Asthma    Axillary vein thrombosis ([place]) 08/16/2015   Calciphylaxis    Chronic kidney disease    Colitis    COPD (chronic obstructive pulmonary disease) (HCC)    Factor V Leiden (HCC)    Hypertension    Lymphedema    legs   MRSA (methicillin resistant staph aureus) culture positive    Neuropathy    Obstructive sleep apnea 03/29/2015   OSA on CPAP    Vision abnormalities     PAST SURGICAL HISTORY: Past Surgical History:  Procedure Laterality Date   APPLICATION OF WOUND VAC     AV FISTULA PLACEMENT Left    ESOPHAGOGASTRODUODENOSCOPY (EGD) WITH PROPOFOL Bilateral 03/19/2022   Procedure:  ESOPHAGOGASTRODUODENOSCOPY (EGD) WITH PROPOFOL;  Surgeon: Arta Silence, MD;  Location: [facility];  Service: Gastroenterology;  Laterality: Bilateral;   lumbar decompression fusion     TONSILLECTOMY      FAMILY HISTORY: Family History  Problem Relation Age of Onset   Diabetes Mother    Congestive Heart Failure Mother    COPD Mother    Stroke Mother    Neuropathy Mother    Heart disease Father    Stroke Father    Cancer Brother    Diabetes Brother    Diabetes Brother    Alcohol abuse Brother     SOCIAL HISTORY:  Social History   Socioeconomic History   Marital status: Single    Spouse name: Not on file   Number of children: Not on file   Years of education: Not on file   Highest education level: Not on file  Occupational History   Not on file  Tobacco Use   Smoking status: Former    Packs/day: 2.00    Years: 30.00    Total pack years: 60.00    Types: Cigarettes    Quit date: 11/25/1998    Years since quitting: 23.9   Smokeless tobacco: Never  Vaping Use   Vaping Use: Never used  Substance and Sexual Activity   Alcohol use: No    Alcohol/week: 0.0 standard drinks of alcohol   Drug use: No   Sexual activity: Not on file  Other Topics Concern   Not on file  Social History Narrative   Not on file   Social Determinants of Health   Financial Resource Strain: Not on file  Food Insecurity: Not on file  Transportation Needs: Not on file  Physical Activity: Not on file  Stress: Not on file  Social Connections: Not on file  Intimate Partner Violence: Not on file  PHYSICAL EXAM  Vitals:   10/30/22 1130  BP: 100/65  Pulse: 71  Weight: 194 lb (88 kg)  Height: 5' (1.524 m)    Body mass index is 37.89 kg/m.   General: The patient is well-developed and well-nourished and in no acute distress.   She has Lymphedema and legs are wrapped.  We removed left wrapping for exam.      Neurologic Exam  Mental status: The patient is alert and oriented x 3  at the time of the examination. The patient has apparent normal recent and remote memory, with an apparently normal attention span and concentration ability.   Speech is normal.  Cranial nerves: Extraocular muscles are intact. Facial strength and sensation is normal.  Hearing is symmetric.    Motor:  Mild essential tremor in hands.  Muscle bulk is normal.   Tone is normal. Strength is normal in the arms. Strength is 4+/5 in the foot and ankle extensors  Sensory: She has very reduced sensation to vibration at the ankles and absent at toes  There is reduced sensation to touch from the ankles down.  Coordination: Cerebellar testing reveals good finger-nose-finger bilaterally.  Heel-to-shin is poor.  Gait and station: Station is normal.  Her gait is wide.  With a walker she has a fairly good-sized stride and good speed.  She is unable to do tandem walk.  Romberg is mildly positive.  Reflexes: Deep tendon reflexes are 1+ in arms and absent in knees and ankles.         ASSESSMENT AND PLAN    1. Polyneuropathy   2. Restless leg   3. OSA on CPAP   4. End-stage renal disease on hemodialysis ([place])   5. Gait disturbance      1.  Continue the gabapentin and ropinirole and tramadol for neuropathy and restless leg syndrome.  Prescriptions were sent in for refills.    She rarely takes an oxycodone when she wakes up with pain 2.   She will continue CPAP +10cm .  She has benefited from CPAP for the treatment of her severe OSA (AHI = 44.7 in February 2015 PSG).     3.   If symptoms worsen again, we can re-do Physical therapy as it helped a lot .  Try to exercise regularly.    4.   return in 6 months or sooner if there are new or worsening neurologic symptoms.  This visit is part of a comprehensive longitudinal care medical relationship regarding the patients primary diagnosis of polyneuropathy, gait disturnce, RLS and OSA and related concerns.   Remell Giaimo A. Felecia Shelling, MD, PhD, Charlynn Grimes  [id number], [id number]  PM Certified in Neurology, Clinical Neurophysiology, Sleep Medicine, Pain Medicine and Neuroimaging  [facility] [address] [place], [place] [address] [phone]

## 2022-11-05 ENCOUNTER — Encounter (HOSPITAL_COMMUNITY)
Admission: RE | Disposition: A | Discharge status: Home / Self Care | Payer: Self-pay | Source: Home / Self Care | Attending: Gastroenterology

## 2022-11-05 ENCOUNTER — Ambulatory Visit (HOSPITAL_COMMUNITY): Payer: Medicare Other | Admitting: Certified Registered Nurse Anesthetist

## 2022-11-05 ENCOUNTER — Other Ambulatory Visit: Payer: Self-pay

## 2022-11-05 ENCOUNTER — Ambulatory Visit (HOSPITAL_COMMUNITY)
Admission: RE | Admit: 2022-11-05 | Discharge: 2022-11-05 | Disposition: A | Discharge status: Home / Self Care | Payer: Medicare Other | Attending: Gastroenterology | Admitting: Gastroenterology

## 2022-11-05 ENCOUNTER — Ambulatory Visit (HOSPITAL_BASED_OUTPATIENT_CLINIC_OR_DEPARTMENT_OTHER): Payer: Medicare Other | Admitting: Certified Registered Nurse Anesthetist

## 2022-11-05 DIAGNOSIS — Z7901 Long term (current) use of anticoagulants: Secondary | ICD-10-CM | POA: Diagnosis not present

## 2022-11-05 DIAGNOSIS — Z6837 Body mass index (BMI) 37.0-37.9, adult: Secondary | ICD-10-CM | POA: Diagnosis not present

## 2022-11-05 DIAGNOSIS — Z9989 Dependence on other enabling machines and devices: Secondary | ICD-10-CM | POA: Diagnosis not present

## 2022-11-05 DIAGNOSIS — I10 Essential (primary) hypertension: Secondary | ICD-10-CM | POA: Insufficient documentation

## 2022-11-05 DIAGNOSIS — K5190 Ulcerative colitis, unspecified, without complications: Secondary | ICD-10-CM

## 2022-11-05 DIAGNOSIS — E669 Obesity, unspecified: Secondary | ICD-10-CM | POA: Insufficient documentation

## 2022-11-05 DIAGNOSIS — I120 Hypertensive chronic kidney disease with stage 5 chronic kidney disease or end stage renal disease: Secondary | ICD-10-CM | POA: Insufficient documentation

## 2022-11-05 DIAGNOSIS — N186 End stage renal disease: Secondary | ICD-10-CM | POA: Insufficient documentation

## 2022-11-05 DIAGNOSIS — Z87891 Personal history of nicotine dependence: Secondary | ICD-10-CM | POA: Insufficient documentation

## 2022-11-05 DIAGNOSIS — K649 Unspecified hemorrhoids: Secondary | ICD-10-CM | POA: Diagnosis not present

## 2022-11-05 DIAGNOSIS — K219 Gastro-esophageal reflux disease without esophagitis: Secondary | ICD-10-CM | POA: Diagnosis not present

## 2022-11-05 DIAGNOSIS — Z1211 Encounter for screening for malignant neoplasm of colon: Secondary | ICD-10-CM | POA: Diagnosis present

## 2022-11-05 DIAGNOSIS — J449 Chronic obstructive pulmonary disease, unspecified: Secondary | ICD-10-CM

## 2022-11-05 DIAGNOSIS — G4733 Obstructive sleep apnea (adult) (pediatric): Secondary | ICD-10-CM | POA: Diagnosis not present

## 2022-11-05 HISTORY — PX: BIOPSY: SHX5522

## 2022-11-05 HISTORY — PX: COLONOSCOPY WITH PROPOFOL: SHX5780

## 2022-11-05 LAB — GLUCOSE, CAPILLARY
Glucose-Capillary: 63 mg/dL — ABNORMAL LOW (ref 70–99)
Glucose-Capillary: 86 mg/dL (ref 70–99)
Glucose-Capillary: 61 mg/dL — ABNORMAL LOW (ref 70–99)

## 2022-11-05 SURGERY — COLONOSCOPY WITH PROPOFOL
Anesthesia: Monitor Anesthesia Care | Laterality: Bilateral

## 2022-11-05 MED ORDER — PROPOFOL 500 MG/50ML IV EMUL
INTRAVENOUS | Status: AC
  Filled 2022-11-05: qty 50

## 2022-11-05 MED ORDER — LIDOCAINE 2% (20 MG/ML) 5 ML SYRINGE
INTRAMUSCULAR | Status: DC | PRN
  Administered 2022-11-05: 60 mg via INTRAVENOUS

## 2022-11-05 MED ORDER — PHENYLEPHRINE 80 MCG/ML (10ML) SYRINGE FOR IV PUSH (FOR BLOOD PRESSURE SUPPORT)
PREFILLED_SYRINGE | INTRAVENOUS | Status: DC | PRN
  Administered 2022-11-05: 80 ug via INTRAVENOUS

## 2022-11-05 MED ORDER — FENTANYL CITRATE (PF) 100 MCG/2ML IJ SOLN
INTRAMUSCULAR | Status: DC | PRN
  Administered 2022-11-05 (×2): 50 ug via INTRAVENOUS

## 2022-11-05 MED ORDER — DEXTROSE 50 % IV SOLN
INTRAVENOUS | Status: AC
  Filled 2022-11-05: qty 50

## 2022-11-05 MED ORDER — MIDAZOLAM HCL 2 MG/2ML IJ SOLN
INTRAMUSCULAR | Status: AC
  Filled 2022-11-05: qty 2

## 2022-11-05 MED ORDER — FENTANYL CITRATE (PF) 100 MCG/2ML IJ SOLN
INTRAMUSCULAR | Status: AC
  Filled 2022-11-05: qty 2

## 2022-11-05 MED ORDER — DEXMEDETOMIDINE HCL IN NACL 80 MCG/20ML IV SOLN
INTRAVENOUS | Status: DC | PRN
  Administered 2022-11-05: 4 ug via BUCCAL

## 2022-11-05 MED ORDER — MIDAZOLAM HCL 2 MG/2ML IJ SOLN
INTRAMUSCULAR | Status: DC | PRN
  Administered 2022-11-05: 2 mg via INTRAVENOUS

## 2022-11-05 MED ORDER — DEXTROSE 50 % IV SOLN
12.5000 g | Freq: Once | INTRAVENOUS | Status: AC
  Administered 2022-11-05: 12.5 g via INTRAVENOUS

## 2022-11-05 MED ORDER — PROPOFOL 500 MG/50ML IV EMUL
INTRAVENOUS | Status: DC | PRN
  Administered 2022-11-05: 175 ug/kg/min via INTRAVENOUS

## 2022-11-05 MED ORDER — SODIUM CHLORIDE 0.9 % IV SOLN: INTRAVENOUS | Status: DC | PRN

## 2022-11-05 MED ORDER — LACTATED RINGERS IV SOLN: INTRAVENOUS | Status: DC | PRN

## 2022-11-05 MED ORDER — PROPOFOL 10 MG/ML IV BOLUS
INTRAVENOUS | Status: DC | PRN
  Administered 2022-11-05: 30 mg via INTRAVENOUS
  Administered 2022-11-05: 40 mg via INTRAVENOUS

## 2022-11-05 SURGICAL SUPPLY — 22 items

## 2022-11-05 NOTE — H&P (Signed)
[facility] H/P Note  Chief Complaint: ulcerative colitis  HPI: Whitney Chavez is an 67 y.o. female.  Chronic ulcerative pancolitis, well-maintained on sulfasalazine.  Here for surveillance colonoscopy.  No abdominal pain, diarrhea, blood in stool.  Past Medical History:  Diagnosis Date   Asthma    Axillary vein thrombosis ([place]) 08/16/2015   Calciphylaxis    Chronic kidney disease    Colitis    COPD (chronic obstructive pulmonary disease) (HCC)    Factor V Leiden (HCC)    Hypertension    Lymphedema    legs   MRSA (methicillin resistant staph aureus) culture positive    Neuropathy    Obstructive sleep apnea 03/29/2015   OSA on CPAP    Vision abnormalities     Past Surgical History:  Procedure Laterality Date   APPLICATION OF WOUND VAC     AV FISTULA PLACEMENT Left    ESOPHAGOGASTRODUODENOSCOPY (EGD) WITH PROPOFOL Bilateral 03/19/2022   Procedure: ESOPHAGOGASTRODUODENOSCOPY (EGD) WITH PROPOFOL;  Surgeon: Arta Silence, MD;  Location: [facility];  Service: Gastroenterology;  Laterality: Bilateral;   lumbar decompression fusion     TONSILLECTOMY      Medications Prior to Admission  Medication Sig Dispense Refill   acetaminophen (TYLENOL) 500 MG tablet Take 500 mg by mouth every 6 (six) hours as needed for moderate pain.     amiodarone (PACERONE) 200 MG tablet Take 200 mg by mouth in the morning.     apixaban (ELIQUIS) 5 MG TABS tablet Take 1 tablet (5 mg total) by mouth 2 (two) times daily. 60 tablet 0   atorvastatin (LIPITOR) 20 MG tablet Take 20 mg by mouth every evening.     B Complex-C-Folic Acid (RENAL VITAMIN PO) Take 1 tablet by mouth daily.     budesonide-formoterol (SYMBICORT) 160-4.5 MCG/ACT inhaler Inhale 2 puffs into the lungs in the morning and at bedtime. 30.6 g 3   cetirizine (ZYRTEC) 10 MG tablet Take 10 mg by mouth in the morning.     epoetin alfa (EPOGEN) 2000 UNIT/ML injection 2,000 Units 3 (three) times a week.     Etelcalcetide HCl (PARSABIV  IV) Inject 1 Dose into the vein 3 (three) times a week. Receives at Dialysis     gabapentin (NEURONTIN) 100 MG capsule Take 1 capsule (100 mg total) by mouth 4 (four) times daily. (Patient taking differently: Take 100 mg by mouth See admin instructions. 100 mg at 1500 and 200 mg at bedtime) 360 capsule 3   heparin 5000 UNIT/ML injection Inject 1 mL (5,000 Units total) into the skin 2 (two) times daily. To be given subcutaneously for 3 days prior to surgery on July 7th. 6 mL 0   lanthanum (FOSRENOL) 1000 MG chewable tablet Chew 1,000-2,000 mg by mouth 2 (two) times daily with a meal.  500 mg with each snack     omeprazole (PRILOSEC) 20 MG capsule Take 20 mg by mouth daily as needed (acid reflux). Rarely takes     ondansetron (ZOFRAN-ODT) 4 MG disintegrating tablet Take 4 mg by mouth every Monday, Wednesday, and Friday.     oxyCODONE (ROXICODONE) 5 MG immediate release tablet Take one po qd formore severe pain (Patient taking differently: Take one po as needed  for severe pain) 30 tablet 0   PRESCRIPTION MEDICATION Inhale into the lungs at bedtime. CPAP with nasal pillow     rOPINIRole (REQUIP) 0.5 MG tablet TAKE 1-2 TABLETS BY MOUTH IN THE MORNING. (Patient taking differently: Take 0.5 mg by mouth See admin  instructions. Take at 1500) 180 tablet 4   rOPINIRole (REQUIP) 2 MG tablet Take 1 tablet (2 mg total) by mouth at bedtime. 90 tablet 3   SODIUM THIOSULFATE IV Inject 1 Dose into the vein 3 (three) times a week. Receives at Dialysis     sulfaSALAzine (AZULFIDINE) 500 MG tablet Take 500 mg by mouth 4 (four) times daily.     traMADol (ULTRAM) 50 MG tablet TAKE 1 TABLET (50 MG TOTAL) BY MOUTH 2 (TWO) TIMES DAILY AS NEEDED (PAIN). (Patient taking differently: Take 50 mg by mouth 2 (two) times daily. 1500 & Bedtime) 60 tablet 5   triamcinolone cream (KENALOG) 0.1 % Apply 1 Application topically daily as needed (itching).     umeclidinium bromide (INCRUSE ELLIPTA) 62.5 MCG/ACT AEPB Inhale 1 puff into the  lungs daily. 90 each 1   Elastic Bandages & Supports ([employer] STOCKINGS) MISC by Does not apply route. Leg wraps.      Allergies:  Allergies  Allergen Reactions   Estrogens Other (See Comments)    Blood clots   Other Other (See Comments)    Unable to take birth control pills due to clotting disorder/fim   Prednisone Other (See Comments)    Facial edema - has had this 2020 and had no difficulty "can take in small doses"   Progesterone Other (See Comments)    Blood clots   Propofol Other (See Comments)    Became disinhibited and exacerbated restless leg syndrome during EGD/colonoscopy. Patient has requested an alternate form of sedation during future procedures.    Lisinopril Cough   Clotrimazole Rash   Tape Rash    Please use paper tape    Family History  Problem Relation Age of Onset   Diabetes Mother    Congestive Heart Failure Mother    COPD Mother    Stroke Mother    Neuropathy Mother    Heart disease Father    Stroke Father    Cancer Brother    Diabetes Brother    Diabetes Brother    Alcohol abuse Brother     Social History:  reports that she quit smoking about 23 years ago. Her smoking use included cigarettes. She has a 60.00 pack-year smoking history. She has never used smokeless tobacco. She reports that she does not drink alcohol and does not use drugs.   ROS: As per HPI, all others negative   Blood pressure (!) 113/56, pulse 70, temperature 98.2 F (36.8 C), temperature source Temporal, resp. rate 13, height 5' (1.524 m), weight 86.2 kg, SpO2 100 %. General appearance: Overweight, NAD NECK:  Thick, supple HEENT:  [place]/AT, anicteric CV:  Regular LUNGS:  No distress ABD:  Soft, protuberant, non-tender NEURO:  No encephalopathy  Results for orders placed or performed during the hospital encounter of 11/05/22 (from the past 48 hour(s))  Glucose, capillary     Status: Abnormal   Collection Time: 11/05/22  8:33 AM  Result Value Ref Range    Glucose-Capillary 63 (L) 70 - 99 mg/dL    Comment: Glucose reference range applies only to samples taken after fasting for at least 8 hours.   No results found.  Assessment/Plan   Ulcerative colitis.  Needs surveillance colonoscopy. Chronic anticoagulation, Eliquis, on hold (heparin shots, last Monday). Colonoscopy with surveillance biopsies. Risks (bleeding, infection, bowel perforation that could require surgery, sedation-related changes in cardiopulmonary systems), benefits (identification and possible treatment of source of symptoms, exclusion of certain causes of symptoms), and alternatives (watchful waiting, radiographic imaging  studies, empiric medical treatment) of colonoscopy were explained to patient/family in detail and patient wishes to proceed.   Landry Dyke 11/05/2022, 9:29 AM

## 2022-11-05 NOTE — Transfer of Care (Signed)
Immediate Anesthesia Transfer of Care Note  Patient: Whitney Chavez  Procedure(s) Performed: COLONOSCOPY WITH PROPOFOL (Bilateral) BIOPSY  Patient Location: Endoscopy Unit  Anesthesia Type:MAC  Level of Consciousness: awake and patient cooperative  Airway & Oxygen Therapy: Patient Spontanous Breathing and Patient connected to face mask  Post-op Assessment: Report given to RN and Post -op Vital signs reviewed and stable  Post vital signs: Reviewed and stable  Last Vitals:  Vitals Value Taken Time  BP    Temp    Pulse    Resp    SpO2      Last Pain:  Vitals:   11/05/22 0823  TempSrc: Temporal  PainSc: 0-No pain         Complications: No notable events documented.

## 2022-11-05 NOTE — Discharge Instructions (Signed)
Colonoscopy ° °Post procedure instructions: ° °Read the instructions outlined below and refer to this sheet in the next few weeks. These discharge instructions provide you with general information on caring for yourself after you leave the hospital. Your doctor may also give you specific instructions. While your treatment has been planned according to the most current medical practices available, unavoidable complications occasionally occur. If you have any problems or questions after discharge, call Dr. Myka Lukins at [facility] ([phone]). ° °HOME CARE INSTRUCTIONS ° °ACTIVITY: °· You may resume your regular activity, but move at a slower pace for the next 24 hours.  °· Take frequent rest periods for the next 24 hours.  °· Walking will help get rid of the air and reduce the bloated feeling in your belly (abdomen).  °· No driving for 24 hours (because of the medicine (anesthesia) used during the test).  °· You may shower.  °· Do not sign any important legal documents or operate any machinery for 24 hours (because of the anesthesia used during the test).  °NUTRITION: °· Drink plenty of fluids.  °· You may resume your normal diet as instructed by your doctor.  °· Begin with a light meal and progress to your normal diet. Heavy or fried foods are harder to digest and may make you feel sick to your stomach (nauseated).  °· Avoid alcoholic beverages for 24 hours or as instructed.  °MEDICATIONS: °· You may resume your normal medications unless your doctor tells you otherwise.  °WHAT TO EXPECT TODAY: °· Some feelings of bloating in the abdomen.  °· Passage of more gas than usual.  °· Spotting of blood in your stool or on the toilet paper.  °IF YOU HAD POLYPS REMOVED DURING THE COLONOSCOPY: °· No aspirin products for 7 days or as instructed.  °· No alcohol for 7 days or as instructed.  °· Eat a soft diet for the next 24 hours.  ° °FINDING OUT THE RESULTS OF YOUR TEST ° °Not all test results are available during your  visit. If your test results are not back during the visit, make an appointment with your caregiver to find out the results. Do not assume everything is normal if you have not heard from your caregiver or the medical facility. It is important for you to follow up on all of your test results.  ° ° ° °SEEK IMMEDIATE MEDICAL CARE IF: ° °· You have more than a spotting of blood in your stool.  °· Your belly is swollen (abdominal distention).  °· You are nauseated or vomiting.  °· You have a fever.  °· You have abdominal pain or discomfort that is severe or gets worse throughout the day.  ° ° °Document Released: 04/15/2004 Document Revised: 05/14/2011 Document Reviewed: 04/13/2008 °[employer]® Patient Information ©2012 [employer]. ° °

## 2022-11-05 NOTE — Anesthesia Preprocedure Evaluation (Signed)
Anesthesia Evaluation  Patient identified by MRN, date of birth, ID band Patient awake    Reviewed: Allergy & Precautions, NPO status , Patient's Chart, lab work & pertinent test results  Airway Mallampati: II  TM Distance: >3 FB Neck ROM: Full    Dental  (+) Teeth Intact, Dental Advisory Given   Pulmonary asthma , sleep apnea and Continuous Positive Airway Pressure Ventilation , COPD, former smoker   Pulmonary exam normal breath sounds clear to auscultation       Cardiovascular hypertension, Normal cardiovascular exam+ Valvular Problems/Murmurs (mod AS) AS  Rhythm:Regular Rate:Normal  TTE 2020 1. The left ventricle has low normal systolic function, with an ejection  fraction of 50-55%. The cavity size was normal. There is moderate  concentric left ventricular hypertrophy. Left ventricular diastolic  Doppler parameters are consistent with  pseudonormalization. The E/e' is 52.6. There is Septal bounce is present.  No evidence of left ventricular regional wall motion abnormalities.  2. The right ventricle has normal systolic function. The cavity was  mildly enlarged. There is no increase in right ventricular wall thickness.  Right ventricular systolic pressure is mildly elevated with an estimated  pressure of 44.8 mmHg.  3. Left atrial size was moderately dilated.  4. Right atrial size was mildly dilated.  5. Mild thickening of the mitral valve leaflet. There is moderate mitral  annular calcification present.  6. The aortic valve has an indeterminate number of cusps Mild thickening  of the aortic valve Moderate calcification of the aortic valve. moderate  stenosis of the aortic valve.  7. Possibly functional bicuspid aortic valve due to calcification of the  left and non coronary commissure.  8. The inferior vena cava was dilated in size with <50% respiratory  variability   Neuro/Psych  PSYCHIATRIC DISORDERS  Depression     negative neurological ROS     GI/Hepatic Neg liver ROS,GERD  Medicated,,  Endo/Other    Renal/GU Dialysis and ESRFRenal disease  negative genitourinary   Musculoskeletal negative musculoskeletal ROS (+)    Abdominal  (+) + obese  Peds  Hematology  (+) Blood dyscrasia (Factor V Leiden)   Anesthesia Other Findings  IMPRESSIONS     1. The left ventricle has low normal systolic function, with an ejection  fraction of 50-55%. The cavity size was normal. There is moderate  concentric left ventricular hypertrophy. Left ventricular diastolic  Doppler parameters are consistent with  pseudonormalization. The E/e' is 85.6. There is Septal bounce is present.  No evidence of left ventricular regional wall motion abnormalities.   2. The right ventricle has normal systolic function. The cavity was  mildly enlarged. There is no increase in right ventricular wall thickness.  Right ventricular systolic pressure is mildly elevated with an estimated  pressure of 44.8 mmHg.   3. Left atrial size was moderately dilated.   4. Right atrial size was mildly dilated.   5. Mild thickening of the mitral valve leaflet. There is moderate mitral  annular calcification present.   6. The aortic valve has an indeterminate number of cusps Mild thickening  of the aortic valve Moderate calcification of the aortic valve. moderate  stenosis of the aortic valve.   7. Possibly functional bicuspid aortic valve due to calcification of the  left and non coronary commissure.   8. The inferior vena cava was dilated in size with <50% respiratory  variability.     Reproductive/Obstetrics  Anesthesia Physical Anesthesia Plan  ASA: 3  Anesthesia Plan: MAC   Post-op Pain Management: Precedex   Induction: Intravenous  PONV Risk Score and Plan: 2  Airway Management Planned: Natural Airway, Nasal Cannula and Simple Face Mask  Additional Equipment:  None  Intra-op Plan:   Post-operative Plan:   Informed Consent: I have reviewed the patients History and Physical, chart, labs and discussed the procedure including the risks, benefits and alternatives for the proposed anesthesia with the patient or authorized representative who has indicated his/her understanding and acceptance.       Plan Discussed with: CRNA  Anesthesia Plan Comments: (Per patient, received propofol during outpatient GI procedures and RLS exacerbated. Per Dr. Paulita Fujita, patient became disinhibited during outpatient procedures; propofol not a true allergy. Pt received propofol in 2019 without any complications. Patient and Dr. Paulita Fujita amenable to proceeding today with propofol. )        Anesthesia Quick Evaluation

## 2022-11-05 NOTE — Op Note (Signed)
[facility] Patient Name: Whitney Chavez Procedure Date: 11/05/2022 MRN: [id number] Attending MD: Arta Silence , MD, [id number] Date of Birth: 14-Apr-1956 CSN: [id number] Age: 67 Admit Type: Outpatient Procedure:                Colonoscopy Indications:              High risk colon cancer surveillance: Ulcerative                            pancolitis of 8 (or more) years duration, Last                            colonoscopy: 2020 Providers:                Arta Silence, MD, Jaci Carrel, RN, Brien Mates, Technician Referring MD:              Medicines:                Monitored Anesthesia Care Complications:            No immediate complications. Estimated Blood Loss:     Estimated blood loss: none. Procedure:                Pre-Anesthesia Assessment:                           - Prior to the procedure, a History and Physical                            was performed, and patient medications and                            allergies were reviewed. The patient's tolerance of                            previous anesthesia was also reviewed. The risks                            and benefits of the procedure and the sedation                            options and risks were discussed with the patient.                            All questions were answered, and informed consent                            was obtained. Prior Anticoagulants: The patient has                            taken Eliquis (apixaban), last dose was 4 days                            prior to  procedure. Also subcutaneous heparin, last                            dose two days ago. ASA Grade Assessment: III - A                            patient with severe systemic disease. After                            reviewing the risks and benefits, the patient was                            deemed in satisfactory condition to undergo the                            procedure.                            After obtaining informed consent, the colonoscope                            was passed under direct vision. Throughout the                            procedure, the patient's blood pressure, pulse, and                            oxygen saturations were monitored continuously. The                            [id number] Olympus colonoscope was                            introduced through the anus and advanced to the the                            cecum, identified by the appendiceal orifice. The                            ileocecal valve, appendiceal orifice, and rectum                            were photographed. The entire colon was examined.                            The colonoscopy was performed without difficulty.                            The patient tolerated the procedure well. The                            quality of the bowel preparation was fair. Scope In: 9:55:45 AM Scope Out: 10:12:54 AM Scope Withdrawal Time: 0 hours 10 minutes 45 seconds  Total Procedure Duration: 0 hours 17  minutes 9 seconds  Findings:      Hemorrhoids were found on perianal exam.      Bowel prep diffusely fair; semisolid and viscous stool obscured some       views throughout colon; diminutive or subtle sessile polyps could easily       have been missed.      The colon (entire examined portion) appeared normal. Biopsies for       histology were taken with a cold forceps from the right colon and left       colon for evaluation of dysplasia in setting of chronic ulcerative       pancolitis.      The exam was otherwise without abnormality on direct and retroflexion       views. Impression:               - Preparation of the colon was fair.                           - Hemorrhoids found on perianal exam.                           - The entire examined colon is normal. Biopsied.                           - The examination was otherwise normal on direct                             and retroflexion views.                           - The examination was otherwise normal. Moderate Sedation:      None Recommendation:           - Patient has a contact number available for                            emergencies. The signs and symptoms of potential                            delayed complications were discussed with the                            patient. Return to normal activities tomorrow.                            Written discharge instructions were provided to the                            patient.                           - Discharge patient to home (ambulatory).                           - Resume previous diet today.                           - Continue present  medications.                           - Resume Eliquis (apixaban) at prior dose today.                           - Await pathology results.                           - Repeat colonoscopy (date not yet determined) for                            surveillance based on pathology results.                           - Return to GI clinic as previously scheduled.                           - Return to referring physician as previously                            scheduled. Procedure Code(s):        --- Professional ---                           [id number], Colonoscopy, flexible; with biopsy, single                            or multiple Diagnosis Code(s):        --- Professional ---                           K51.00, Ulcerative (chronic) pancolitis without                            complications                           K64.9, Unspecified hemorrhoids CPT copyright 2022 [employer]. All rights reserved. The codes documented in this report are preliminary and upon coder review may  be revised to meet current compliance requirements. Arta Silence, MD 11/05/2022 10:18:29 AM This report has been signed electronically. Number of Addenda: 0

## 2022-11-06 LAB — SURGICAL PATHOLOGY

## 2022-11-07 ENCOUNTER — Encounter (HOSPITAL_COMMUNITY): Payer: Self-pay | Admitting: Gastroenterology

## 2022-11-07 NOTE — Anesthesia Postprocedure Evaluation (Signed)
Anesthesia Post Note  Patient: Whitney Chavez  Procedure(s) Performed: COLONOSCOPY WITH PROPOFOL (Bilateral) BIOPSY     Patient location during evaluation: Endoscopy Anesthesia Type: MAC Level of consciousness: awake Pain management: pain level controlled Vital Signs Assessment: post-procedure vital signs reviewed and stable Respiratory status: spontaneous breathing Cardiovascular status: tachycardic Postop Assessment: no apparent nausea or vomiting Anesthetic complications: no  No notable events documented.  Last Vitals:  Vitals:   11/05/22 1035 11/05/22 1045  BP: 101/63 (!) 123/100  Pulse: 89 85  Resp: 20 16  Temp:    SpO2: 96% 95%    Last Pain:  Vitals:   11/05/22 1045  TempSrc:   PainSc: 7    Pain Goal:                   Huston Foley

## 2022-11-12 ENCOUNTER — Encounter (HOSPITAL_BASED_OUTPATIENT_CLINIC_OR_DEPARTMENT_OTHER): Payer: Self-pay

## 2022-11-12 ENCOUNTER — Other Ambulatory Visit: Payer: Self-pay

## 2022-11-12 ENCOUNTER — Emergency Department (HOSPITAL_BASED_OUTPATIENT_CLINIC_OR_DEPARTMENT_OTHER)
Admission: EM | Admit: 2022-11-12 | Discharge: 2022-11-12 | Disposition: A | Discharge status: Home / Self Care | Payer: Medicare Other | Attending: Emergency Medicine | Admitting: Emergency Medicine

## 2022-11-12 ENCOUNTER — Emergency Department (HOSPITAL_BASED_OUTPATIENT_CLINIC_OR_DEPARTMENT_OTHER): Payer: Medicare Other

## 2022-11-12 DIAGNOSIS — Z7901 Long term (current) use of anticoagulants: Secondary | ICD-10-CM | POA: Diagnosis not present

## 2022-11-12 DIAGNOSIS — Z992 Dependence on renal dialysis: Secondary | ICD-10-CM | POA: Insufficient documentation

## 2022-11-12 DIAGNOSIS — R000 Tachycardia, unspecified: Secondary | ICD-10-CM | POA: Diagnosis present

## 2022-11-12 DIAGNOSIS — I483 Typical atrial flutter: Secondary | ICD-10-CM | POA: Diagnosis not present

## 2022-11-12 LAB — CBC WITH DIFFERENTIAL/PLATELET
Abs Immature Granulocytes: 0.02 10*3/uL (ref 0.00–0.07)
Basophils Absolute: 0.1 10*3/uL (ref 0.0–0.1)
Basophils Relative: 1 %
Eosinophils Absolute: 0.2 10*3/uL (ref 0.0–0.5)
Eosinophils Relative: 4 %
HCT: 36.5 % (ref 36.0–46.0)
Hemoglobin: 11.9 g/dL — ABNORMAL LOW (ref 12.0–15.0)
Immature Granulocytes: 0 %
Lymphocytes Relative: 12 %
Lymphs Abs: 0.8 10*3/uL (ref 0.7–4.0)
MCH: 29.8 pg (ref 26.0–34.0)
MCHC: 32.6 g/dL (ref 30.0–36.0)
MCV: 91.3 fL (ref 80.0–100.0)
Monocytes Absolute: 0.6 10*3/uL (ref 0.1–1.0)
Monocytes Relative: 9 %
Neutro Abs: 4.6 10*3/uL (ref 1.7–7.7)
Neutrophils Relative %: 74 %
Platelets: 194 10*3/uL (ref 150–400)
RBC: 4 MIL/uL (ref 3.87–5.11)
RDW: 16 % — ABNORMAL HIGH (ref 11.5–15.5)
WBC: 6.3 10*3/uL (ref 4.0–10.5)
nRBC: 0 % (ref 0.0–0.2)

## 2022-11-12 LAB — BASIC METABOLIC PANEL
Anion gap: 17 — ABNORMAL HIGH (ref 5–15)
BUN: 11 mg/dL (ref 8–23)
CO2: 23 mmol/L (ref 22–32)
Calcium: 8.9 mg/dL (ref 8.9–10.3)
Chloride: 99 mmol/L (ref 98–111)
Creatinine, Ser: 2.7 mg/dL — ABNORMAL HIGH (ref 0.44–1.00)
GFR, Estimated: 19 mL/min — ABNORMAL LOW (ref >60)
Glucose, Bld: 97 mg/dL (ref 70–99)
Potassium: 3.6 mmol/L (ref 3.5–5.1)
Sodium: 139 mmol/L (ref 135–145)

## 2022-11-12 LAB — MAGNESIUM: Magnesium: 1.9 mg/dL (ref 1.7–2.4)

## 2022-11-12 MED ORDER — METOPROLOL TARTRATE 5 MG/5ML IV SOLN
2.5000 mg | Freq: Once | INTRAVENOUS | Status: AC
  Administered 2022-11-12: 2.5 mg via INTRAVENOUS
  Filled 2022-11-12: qty 5

## 2022-11-12 MED ORDER — METOPROLOL TARTRATE 5 MG/5ML IV SOLN
5.0000 mg | Freq: Once | INTRAVENOUS | Status: DC
  Filled 2022-11-12: qty 5

## 2022-11-12 MED ORDER — ETOMIDATE 2 MG/ML IV SOLN
12.0000 mg | Freq: Once | INTRAVENOUS | Status: DC
  Filled 2022-11-12: qty 10

## 2022-11-12 NOTE — Discharge Instructions (Signed)
Please contact your cardiologist and let them know about your visit here.  Please return to the emergency department for any worsening or change in your symptoms.

## 2022-11-12 NOTE — ED Provider Notes (Signed)
[place] [facility] Provider Note   CSN: [id number] Arrival date & time: 11/12/22  1102     History  Chief Complaint  Patient presents with   Tachycardia    Whitney Chavez is a 67 y.o. female.  67 yo F with a chief complaints of of a fast heart rate.  This was documented at the end of her dialysis session today.  She had a slightly longer than normal dialysis session due to her having a bit more fluid on her than normal.  This is due to her having a recent colonoscopy and have to drink the prep for it.  She is asymptomatic.  She says she feels well.  She was instructed to come to the ED after her session.  She has a history of atrial fibrillation and is on Eliquis.  She denies any missed doses of her medication.  She also takes amiodarone and took a dose of this before coming.        Home Medications Prior to Admission medications   Medication Sig Start Date End Date Taking? Authorizing Provider  acetaminophen (TYLENOL) 500 MG tablet Take 500 mg by mouth every 6 (six) hours as needed for moderate pain.   Yes [provider]  amiodarone (PACERONE) 200 MG tablet Take 200 mg by mouth in the morning. 12/28/18  Yes [provider]  apixaban (ELIQUIS) 5 MG TABS tablet Take 1 tablet (5 mg total) by mouth 2 (two) times daily. 10/13/22 11/12/22 Yes Ennever, Rudell Cobb, MD  atorvastatin (LIPITOR) 20 MG tablet Take 20 mg by mouth every evening. 01/03/19  Yes [provider]  B Complex-C-Folic Acid (RENAL VITAMIN PO) Take 1 tablet by mouth daily.   Yes [provider]  budesonide-formoterol (SYMBICORT) 160-4.5 MCG/ACT inhaler Inhale 2 puffs into the lungs in the morning and at bedtime. 09/04/22  Yes Margaretha Seeds, MD  cetirizine (ZYRTEC) 10 MG tablet Take 10 mg by mouth in the morning.   Yes [provider]  Elastic Bandages & Supports ([place]) [place] by Does not apply route. Leg wraps.   Yes  [provider]  epoetin alfa (EPOGEN) 2000 UNIT/ML injection 2,000 Units 3 (three) times a week.   Yes [provider]  Etelcalcetide HCl (PARSABIV IV) Inject 1 Dose into the vein 3 (three) times a week. Receives at Dialysis   Yes [provider]  gabapentin (NEURONTIN) 100 MG capsule Take 1 capsule (100 mg total) by mouth 4 (four) times daily. Patient taking differently: Take 100 mg by mouth See admin instructions. 100 mg at 1500 and 200 mg at bedtime 10/30/22  Yes Sater, Nanine Means, MD  lanthanum (FOSRENOL) 500 MG chewable tablet Chew 1,000-2,000 mg by mouth 2 (two) times daily with a meal.  500 mg with each snack 12/04/16  Yes [provider]  omeprazole (PRILOSEC) 20 MG capsule Take 20 mg by mouth daily as needed (acid reflux). Rarely takes   Yes [provider]  ondansetron (ZOFRAN-ODT) 4 MG disintegrating tablet Take 4 mg by mouth every Monday, Wednesday, and Friday.   Yes [provider]  oxyCODONE (ROXICODONE) 5 MG immediate release tablet Take one po qd formore severe pain Patient taking differently: Take 5 mg by mouth as needed for moderate pain. 10/30/22  Yes Sater, Nanine Means, MD  PRESCRIPTION MEDICATION Inhale into the lungs at bedtime. CPAP with nasal pillow   Yes [provider]  rOPINIRole (REQUIP) 0.5 MG tablet TAKE  1-2 TABLETS BY MOUTH IN THE MORNING. Patient taking differently: Take 0.5 mg by mouth See admin instructions. Take at 1500 10/30/22  Yes Sater, Nanine Means, MD  rOPINIRole (REQUIP) 2 MG tablet Take 1 tablet (2 mg total) by mouth at bedtime. 10/30/22  Yes Sater, Nanine Means, MD  SODIUM THIOSULFATE IV Inject 1 Dose into the vein 3 (three) times a week. Receives at Dialysis   Yes [provider]  sulfaSALAzine (AZULFIDINE) 500 MG tablet Take 500 mg by mouth 4 (four) times daily. 02/15/15  Yes [provider]  traMADol (ULTRAM) 50 MG tablet TAKE 1 TABLET (50 MG TOTAL) BY MOUTH 2 (TWO) TIMES DAILY AS NEEDED  (PAIN). Patient taking differently: Take 50 mg by mouth 2 (two) times daily. 1500 & Bedtime 10/29/22  Yes Sater, Nanine Means, MD  triamcinolone cream (KENALOG) 0.1 % Apply 1 Application topically daily as needed (itching). 08/31/18  Yes [provider]  umeclidinium bromide (INCRUSE ELLIPTA) 62.5 MCG/ACT AEPB Inhale 1 puff into the lungs daily. 05/16/22  Yes Margaretha Seeds, MD  heparin 5000 UNIT/ML injection Inject 1 mL (5,000 Units total) into the skin 2 (two) times daily. To be given subcutaneously for 3 days prior to surgery on July 7th. Patient not taking: Reported on 11/12/2022 09/29/22   Volanda Napoleon, MD      Allergies    Estrogens, Other, Prednisone, Progesterone, Propofol, Lisinopril, Clotrimazole, and Tape    Review of Systems   Review of Systems  Physical Exam Updated Vital Signs BP (!) 99/56   Pulse 78   Temp 98 F (36.7 C) (Oral)   Resp 17   Ht 5' (1.524 m)   Wt 86.5 kg   SpO2 90%   BMI 37.24 kg/m  Physical Exam Vitals and nursing note reviewed.  Constitutional:      General: She is not in acute distress.    Appearance: She is well-developed. She is not diaphoretic.  HENT:     Head: Normocephalic and atraumatic.  Eyes:     Pupils: Pupils are equal, round, and reactive to light.  Cardiovascular:     Rate and Rhythm: Regular rhythm. Tachycardia present.     Heart sounds: No murmur heard.    No friction rub. No gallop.  Pulmonary:     Effort: Pulmonary effort is normal.     Breath sounds: No wheezing or rales.  Abdominal:     General: There is no distension.     Palpations: Abdomen is soft.     Tenderness: There is no abdominal tenderness.  Musculoskeletal:        General: No tenderness.     Cervical back: Normal range of motion and neck supple.  Skin:    General: Skin is warm and dry.  Neurological:     Mental Status: She is alert and oriented to person, place, and time.  Psychiatric:        Behavior: Behavior normal.     ED Results /  Procedures / Treatments   Labs (all labs ordered are listed, but only abnormal results are displayed) Labs Reviewed  CBC WITH DIFFERENTIAL/PLATELET - Abnormal; Notable for the following components:      Result Value   Hemoglobin 11.9 (*)    RDW 16.0 (*)    All other components within normal limits  BASIC METABOLIC PANEL - Abnormal; Notable for the following components:   Creatinine, Ser 2.70 (*)    GFR, Estimated 19 (*)    Anion gap 17 (*)  All other components within normal limits  MAGNESIUM    EKG EKG Interpretation  Date/Time:  Wednesday November 12 2022 12:48:12 EST Ventricular Rate:  77 PR Interval:  178 QRS Duration: 98 QT Interval:  412 QTC Calculation: 467 R Axis:   22 Text Interpretation: Sinus rhythm Abnormal R-wave progression, early transition replaced likely a flutter Otherwise no significant change Confirmed by Deno Etienne [phone]) on 11/12/2022 12:55:28 PM  Radiology DG Chest Portable 1 View  Result Date: 11/12/2022 CLINICAL DATA:  Atrial flutter? EXAM: PORTABLE CHEST 1 VIEW COMPARISON:  10/23/2017 FINDINGS: Normal heart size and stable mediastinal contours. Bulbous appearance of the right paratracheal and hilar contours which is stable since at least 2019 and likely vascular. Poor visualization of retrocardiac aeration is likely from technique given the degree of underpenetration. There is also suspected hazy atelectasis at the bases. There is no edema, air bronchogram, effusion, or pneumothorax. IMPRESSION: Suspect mild atelectasis at the bases.  No pulmonary edema. Electronically Signed   By: Jorje Guild M.D.   On: 11/12/2022 11:31    Procedures .Critical Care  Performed by: Deno Etienne, DO Authorized by: Deno Etienne, DO   Critical care provider statement:    Critical care time (minutes):  35   Critical care time was exclusive of:  Separately billable procedures and treating other patients   Critical care was time spent personally by me on the following  activities:  Development of treatment plan with patient or surrogate, discussions with consultants, evaluation of patient's response to treatment, examination of patient, ordering and review of laboratory studies, ordering and review of radiographic studies, ordering and performing treatments and interventions, pulse oximetry, re-evaluation of patient's condition and review of old charts   Care discussed with: admitting provider       Medications Ordered in ED Medications  etomidate (AMIDATE) injection 12 mg (0 mg Intravenous Hold 11/12/22 1255)  metoprolol tartrate (LOPRESSOR) injection 5 mg (0 mg Intravenous Hold 11/12/22 1233)  metoprolol tartrate (LOPRESSOR) injection 2.5 mg (2.5 mg Intravenous Given 11/12/22 1132)    ED Course/ Medical Decision Making/ A&P                             Medical Decision Making Amount and/or Complexity of Data Reviewed Labs: ordered. Radiology: ordered.  Risk Prescription drug management.   67 yo F with a chief complaints of tachycardia.  This was asymptomatic and noted at the end of her dialysis session today.  She recently had a colonoscopy and still think she is fluid overloaded from taking the prep.  She was a little bit more over her dry weight than normal and so had a bit of a longer session.  She otherwise denies any specific symptoms.  She is on Eliquis and denies any missed doses.  Has a history of factor V Leiden.  On record review the patient has had cardiology visits and documented atrial fibrillation.  On the monitor she is much more consistent with atrial flutter.  She does have a bit of a widened QRS.  I think this is less likely to be ventricular tachycardia as her rate is a bit slower than anticipated.  Patient giving a bolus dose of metoprolol without significant change in rate.  Set up for cardioversion.   As we did set up for cardioversion and there was some ectopy on the monitor and the patient had converted spontaneously to normal  sinus rhythm.  She did have a  period of significant ectopy lasted about 30 minutes.  At that time she was dizzy off and on.  Initially had ordered another dose of metoprolol but by the time that had been drawn at the patient and fully converted to normal sinus rhythm.  Having occasional PVCs.  She tells me she feels much better.  She was able to ambulate around the department without any significant symptoms and told me she would like to go home.  Will have her follow-up with her cardiologist in the office.  CHA2DS2/VAS Stroke Risk Points  Current as of 15 minutes ago     5 >= 2 Points: High Risk  1 - 1.99 Points: Medium Risk  0 Points: Low Risk    No Change      Details    This score determines the patient's risk of having a stroke if the  patient has atrial fibrillation.       Points Metrics  0 Has Congestive Heart Failure:  No    Current as of 15 minutes ago  0 Has Vascular Disease:  No    Current as of 15 minutes ago  1 Has Hypertension:  Yes    Current as of 15 minutes ago  1 Age:  20    Current as of 15 minutes ago  0 Has Diabetes:  No    Current as of 15 minutes ago  2 Had Stroke:  No  Had TIA:  No  Had Thromboembolism:  Yes    Current as of 15 minutes ago  1 Female:  Yes    Current as of 15 minutes ago         1:37 PM:  I have discussed the diagnosis/risks/treatment options with the patient.  Evaluation and diagnostic testing in the emergency department does not suggest an emergent condition requiring admission or immediate intervention beyond what has been performed at this time.  They will follow up with PCP, cards. We also discussed returning to the ED immediately if new or worsening sx occur. We discussed the sx which are most concerning (e.g., sudden worsening pain, fever, inability to tolerate by mouth) that necessitate immediate return. Medications administered to the patient during their visit and any new prescriptions provided to the patient are listed  below.  Medications given during this visit Medications  etomidate (AMIDATE) injection 12 mg (0 mg Intravenous Hold 11/12/22 1255)  metoprolol tartrate (LOPRESSOR) injection 5 mg (0 mg Intravenous Hold 11/12/22 1233)  metoprolol tartrate (LOPRESSOR) injection 2.5 mg (2.5 mg Intravenous Given 11/12/22 1132)     The patient appears reasonably screen and/or stabilized for discharge and I doubt any other medical condition or other [facility] requiring further screening, evaluation, or treatment in the ED at this time prior to discharge.           Final Clinical Impression(s) / ED Diagnoses Final diagnoses:  Typical atrial flutter [facility])    Rx / DC Orders ED Discharge Orders     None         Deno Etienne, DO 11/12/22 1337

## 2022-11-12 NOTE — ED Notes (Signed)
Per EDP. Pause on procedural sedation and cardioversion. Try metoprolol again and reassess

## 2022-11-12 NOTE — ED Notes (Signed)
Placed patient on ETCO2 monitor in preparation for sedation. Suction setup, AMBU at bedside.

## 2022-11-12 NOTE — ED Triage Notes (Signed)
"  Finished all of my dialysis treatment and then when they checked me out my heart rate was in the 140s. Took my amiodorone at 0945, but it has not helped" per pt. Patient complains of mild shortness of breath. Denies chest pain

## 2022-11-12 NOTE — ED Notes (Signed)
Discharge paperwork reviewed entirely with patient, including Rx's and follow up care. Pain was under control. Pt verbalized understanding as well as all parties involved. No questions or concerns voiced at the time of discharge. No acute distress noted.   Pt ambulated out to PVA without incident or assistance.  

## 2022-11-12 NOTE — Sedation Documentation (Signed)
Pt placed on Zoll pads. 

## 2022-11-12 NOTE — ED Notes (Signed)
ED Provider at bedside. 

## 2022-12-02 ENCOUNTER — Encounter (HOSPITAL_BASED_OUTPATIENT_CLINIC_OR_DEPARTMENT_OTHER): Payer: Medicare Other

## 2022-12-02 ENCOUNTER — Ambulatory Visit (HOSPITAL_BASED_OUTPATIENT_CLINIC_OR_DEPARTMENT_OTHER): Payer: Medicare Other | Admitting: Pulmonary Disease

## 2023-01-27 ENCOUNTER — Telehealth (HOSPITAL_BASED_OUTPATIENT_CLINIC_OR_DEPARTMENT_OTHER): Payer: Self-pay | Admitting: Pulmonary Disease

## 2023-01-27 NOTE — Telephone Encounter (Signed)
When calling to cancel PFT again due to tech being out patient states she [place] on the other side of [place] and does not want to drive separate days and wanting to be called with results instead. Patient states she is not having any issues.   Also states she will be needed a refill soon for Incruse since she hasn't been in for an OV to ask. Please advise. PFT appt has been changed to 6/20 and has been placed on the wait list for any cancellations.

## 2023-01-27 NOTE — Telephone Encounter (Signed)
Ok with me. Please add her on as a telephone visit at the blocked spot at end of the day after her PFT date

## 2023-01-29 ENCOUNTER — Encounter (HOSPITAL_BASED_OUTPATIENT_CLINIC_OR_DEPARTMENT_OTHER): Payer: Medicare Other

## 2023-01-29 ENCOUNTER — Encounter (HOSPITAL_BASED_OUTPATIENT_CLINIC_OR_DEPARTMENT_OTHER): Payer: Self-pay

## 2023-01-29 ENCOUNTER — Ambulatory Visit (HOSPITAL_BASED_OUTPATIENT_CLINIC_OR_DEPARTMENT_OTHER): Payer: Medicare Other | Admitting: Pulmonary Disease

## 2023-02-03 ENCOUNTER — Ambulatory Visit (INDEPENDENT_AMBULATORY_CARE_PROVIDER_SITE_OTHER): Payer: Medicare Other | Admitting: Pulmonary Disease

## 2023-02-03 DIAGNOSIS — J4489 Other specified chronic obstructive pulmonary disease: Secondary | ICD-10-CM

## 2023-02-03 LAB — PULMONARY FUNCTION TEST
DL/VA % pred: 94 %
DL/VA: 4.1 ml/min/mmHg/L
DLCO cor % pred: 87 %
DLCO cor: 14.5 ml/min/mmHg
DLCO unc % pred: 86 %
DLCO unc: 14.27 ml/min/mmHg
FEF 25-75 Post: 1.92 L/sec
FEF 25-75 Pre: 1.7 L/sec
FEF2575-%Change-Post: 12 %
FEF2575-%Pred-Post: 106 %
FEF2575-%Pred-Pre: 94 %
FEV1-%Change-Post: 3 %
FEV1-%Pred-Post: 90 %
FEV1-%Pred-Pre: 87 %
FEV1-Post: 1.76 L
FEV1-Pre: 1.69 L
FEV1FVC-%Change-Post: -1 %
FEV1FVC-%Pred-Pre: 106 %
FEV6-%Change-Post: 4 %
FEV6-%Pred-Post: 88 %
FEV6-%Pred-Pre: 84 %
FEV6-Post: 2.16 L
FEV6-Pre: 2.06 L
FEV6FVC-%Pred-Post: 104 %
FEV6FVC-%Pred-Pre: 104 %
FVC-%Change-Post: 4 %
FVC-%Pred-Post: 85 %
FVC-%Pred-Pre: 81 %
FVC-Post: 2.16 L
FVC-Pre: 2.06 L
Post FEV1/FVC ratio: 81 %
Post FEV6/FVC ratio: 100 %
Pre FEV1/FVC ratio: 82 %
Pre FEV6/FVC Ratio: 100 %
RV % pred: 105 %
RV: 1.96 L
TLC % pred: 111 %
TLC: 4.82 L

## 2023-02-03 NOTE — Progress Notes (Signed)
Full PFT Performed Today  

## 2023-02-03 NOTE — Patient Instructions (Signed)
Full PFT Performed Today  

## 2023-02-04 ENCOUNTER — Encounter (HOSPITAL_BASED_OUTPATIENT_CLINIC_OR_DEPARTMENT_OTHER): Payer: Self-pay | Admitting: Pulmonary Disease

## 2023-02-04 ENCOUNTER — Telehealth (INDEPENDENT_AMBULATORY_CARE_PROVIDER_SITE_OTHER): Payer: Medicare Other | Admitting: Pulmonary Disease

## 2023-02-04 DIAGNOSIS — J4489 Other specified chronic obstructive pulmonary disease: Secondary | ICD-10-CM | POA: Diagnosis not present

## 2023-02-04 MED ORDER — INCRUSE ELLIPTA 62.5 MCG/ACT IN AEPB: 1.0000 | INHALATION_SPRAY | Freq: Every day | RESPIRATORY_TRACT | 1 refills | Status: DC

## 2023-02-04 NOTE — Progress Notes (Signed)
Virtual Visit via Telephone Note  I connected with Whitney Chavez on 02/04/23 at  1:00 PM EDT by telephone and verified that I am speaking with the correct person using two identifiers.  Location: Patient: Home Provider: [facility] office   I discussed the limitations, risks, security and privacy concerns of performing an evaluation and management service by telephone and the availability of in person appointments. I also discussed with the patient that there may be a patient responsible charge related to this service. The patient expressed understanding and agreed to proceed.   I discussed the assessment and treatment plan with the patient. The patient was provided an opportunity to ask questions and all were answered. The patient agreed with the plan and demonstrated an understanding of the instructions.   The patient was advised to call back or seek an in-person evaluation if the symptoms worsen or if the condition fails to improve as anticipated.   @Patient  ID: Whitney Chavez, female    DOB: 30-Mar-1956, 67 y.o.   MRN: [id number]  Chief Complaint  Patient presents with   Follow-up    PFT results    Referring provider: Sigmund Hazel, MD  HPI: Whitney Chavez is a 67 year old female former smoker (60-pack years), recurrent thromboembolic disease secondary to Factor V Leiden mutation and prothrombin II on anticoagulation, ESRD on HD, lymphedema, OSA and hx calciphylaxis who presents for follow-up for COPD.   09/02/22 Since her last visit she reports that she is on Symbicort and Incruse. She chronic cough with productive sputum whitish. Denies wheezing or shortness of breath. Overall compliant with inhaler. Has not needed emergency inhaler in years. She has had more fatigue with her dialysis sessions lately. Has more nausea later. Followed by [facility] with Dr. Dulce Sellar and work-up has been negative from their standpoint. Appetite poor and has been losing weight.  Participating in physical therapy. Once graduated she plans to join [employer]. She wants to be evaluated for kidney transplant.  02/04/2023 Since her last visit she has completed PFTs. She is compliant with her Symbicort and Incruse which helps with sputum production.  Past Medical History:  Diagnosis Date   Asthma    Axillary vein thrombosis (HCC) 08/16/2015   Calciphylaxis    Chronic kidney disease    Colitis    COPD (chronic obstructive pulmonary disease) (HCC)    Factor V Leiden (HCC)    Hypertension    Lymphedema    legs   MRSA (methicillin resistant staph aureus) culture positive    Neuropathy    Obstructive sleep apnea 03/29/2015   OSA on CPAP    Vision abnormalities     Outpatient Medications Prior to Visit  Medication Sig Dispense Refill   acetaminophen (TYLENOL) 500 MG tablet Take 500 mg by mouth every 6 (six) hours as needed for moderate pain.     amiodarone (PACERONE) 200 MG tablet Take 200 mg by mouth in the morning.     apixaban (ELIQUIS) 5 MG TABS tablet Take 1 tablet (5 mg total) by mouth 2 (two) times daily. 60 tablet 0   atorvastatin (LIPITOR) 20 MG tablet Take 20 mg by mouth every evening.     B Complex-C-Folic Acid (RENAL VITAMIN PO) Take 1 tablet by mouth daily.     budesonide-formoterol (SYMBICORT) 160-4.5 MCG/ACT inhaler Inhale 2 puffs into the lungs in the morning and at bedtime. 30.6 g 3   cetirizine (ZYRTEC) 10 MG tablet Take 10 mg by mouth  in the morning.     Elastic Bandages & Supports (MEDICAL COMPRESSION STOCKINGS) MISC by Does not apply route. Leg wraps.     epoetin alfa (EPOGEN) 2000 UNIT/ML injection 2,000 Units 3 (three) times a week.     Etelcalcetide HCl (PARSABIV IV) Inject 1 Dose into the vein 3 (three) times a week. Receives at Dialysis     gabapentin (NEURONTIN) 100 MG capsule Take 1 capsule (100 mg total) by mouth 4 (four) times daily. (Patient taking differently: Take 100 mg by mouth See admin instructions. 100 mg at 1500 and 200 mg  at bedtime) 360 capsule 3   heparin 5000 UNIT/ML injection Inject 1 mL (5,000 Units total) into the skin 2 (two) times daily. To be given subcutaneously for 3 days prior to surgery on July 7th. (Patient not taking: Reported on 11/12/2022) 6 mL 0   lanthanum (FOSRENOL) 500 MG chewable tablet Chew 1,000-2,000 mg by mouth 2 (two) times daily with a meal.  500 mg with each snack     omeprazole (PRILOSEC) 20 MG capsule Take 20 mg by mouth daily as needed (acid reflux). Rarely takes     ondansetron (ZOFRAN-ODT) 4 MG disintegrating tablet Take 4 mg by mouth every Monday, Wednesday, and Friday.     oxyCODONE (ROXICODONE) 5 MG immediate release tablet Take one po qd formore severe pain (Patient taking differently: Take 5 mg by mouth as needed for moderate pain.) 30 tablet 0   PRESCRIPTION MEDICATION Inhale into the lungs at bedtime. CPAP with nasal pillow     rOPINIRole (REQUIP) 0.5 MG tablet TAKE 1-2 TABLETS BY MOUTH IN THE MORNING. (Patient taking differently: Take 0.5 mg by mouth See admin instructions. Take at 1500) 180 tablet 4   rOPINIRole (REQUIP) 2 MG tablet Take 1 tablet (2 mg total) by mouth at bedtime. 90 tablet 3   SODIUM THIOSULFATE IV Inject 1 Dose into the vein 3 (three) times a week. Receives at Dialysis     sulfaSALAzine (AZULFIDINE) 500 MG tablet Take 500 mg by mouth 4 (four) times daily.     traMADol (ULTRAM) 50 MG tablet TAKE 1 TABLET (50 MG TOTAL) BY MOUTH 2 (TWO) TIMES DAILY AS NEEDED (PAIN). (Patient taking differently: Take 50 mg by mouth 2 (two) times daily. 1500 & Bedtime) 60 tablet 5   triamcinolone cream (KENALOG) 0.1 % Apply 1 Application topically daily as needed (itching).     umeclidinium bromide (INCRUSE ELLIPTA) 62.5 MCG/ACT AEPB Inhale 1 puff into the lungs daily. 90 each 1   No facility-administered medications prior to visit.    Review of Systems  Review of Systems  Constitutional:  Negative for chills, diaphoresis, fever, malaise/fatigue and weight loss.  HENT:   Negative for congestion.   Respiratory:  Positive for cough and sputum production. Negative for hemoptysis, shortness of breath and wheezing.   Cardiovascular:  Negative for chest pain, palpitations and leg swelling.    There were no vitals taken for this visit.  Physical Exam: General: No apparent distress on the phone   Data Reviewed:  Imaging CXR 09/02/22 - Mild cardiomegaly. No acute issues  PFTs 07/28/2019 FVC 1.59 (55%), FEV1 1.24 (56%), ratio 78, normal DLCO Moderate obstruction with positive BD response. Curvature to flow volume loop.   12/29/17 PFTs with MCT- FVC 1.73 (59%), FEV1 1.30 (58%), ratio 75 Moderate obstruction with positive hyper-reactive airway/ FEV1 declined by more than 20%  02/03/2023 FVC 2.16 (85%) FEV1 1.76 (90%) Ratio 82  TLC 111% DLCO 86%.  No  significant bronchodilator response Interpretation: No restrictive or obstructive defect present. Significantly improved on bronchodilators.   CBC    Component Value Date/Time   WBC 6.3 11/12/2022 1136   RBC 4.00 11/12/2022 1136   HGB 11.9 (L) 11/12/2022 1136   HGB 11.2 (L) 09/01/2022 1249   HGB 9.9 (L) 06/25/2017 1146   HCT 36.5 11/12/2022 1136   HCT 32.3 (L) 06/25/2017 1146   PLT 194 11/12/2022 1136   PLT 149 (L) 09/01/2022 1249   PLT 202 06/25/2017 1146   MCV 91.3 11/12/2022 1136   MCV 92 06/25/2017 1146   MCH 29.8 11/12/2022 1136   MCHC 32.6 11/12/2022 1136   RDW 16.0 (H) 11/12/2022 1136   RDW 16.4 (H) 06/25/2017 1146   LYMPHSABS 0.8 11/12/2022 1136   LYMPHSABS 1.2 06/25/2017 1146   MONOABS 0.6 11/12/2022 1136   EOSABS 0.2 11/12/2022 1136   EOSABS 0.2 06/25/2017 1146   BASOSABS 0.1 11/12/2022 1136   BASOSABS 0.0 06/25/2017 1146   Absolute eos  09/01/22 - 300 11/12/22 - 200    Assessment & Plan:   No problem-specific Assessment & Plan notes found for this encounter.  67 year old female former smoker with ESRD, recurrent thromboembolic disease 2/2 to factor V Leiden mutation and  prothrombin II on anticoagulation, lymphedema, OSA and hx calciphylaxis who presents for COPD follow-up. Reviewed PFTs with improved FEV1 function on current bronchodilator regimen. Symptoms well controlled.  Moderately severe COPD (FEV1 58%) - normalized FEV1 on bronchodilators Asthma-COPD overlap --CONTINUE Symbicort 160-4.5 mcg TWO puffs TWICE a day --CONTINUE Incruse ONE puff a day --CONTINUE Albuterol TWO puffs as needed for shortness of breath or wheezing --Reviewed PFTs. Normal PFTs on bronchodilators   Immunization History  Administered Date(s) Administered   Influenza-Unspecified 06/22/2015, 06/25/2016, 06/30/2019   Moderna Sars-Covid-2 Vaccination 12/01/2019, 12/29/2019   Pneumococcal Conjugate-13 11/27/2014   Pneumococcal Polysaccharide-23 07/11/2016   Zoster Recombinat (Shingrix) 04/11/2017, 08/17/2017   CT Lung screen - not qualified due to >15 years after smoking  No orders of the defined types were placed in this encounter.  Meds ordered this encounter  Medications   umeclidinium bromide (INCRUSE ELLIPTA) 62.5 MCG/ACT AEPB    Sig: Inhale 1 puff into the lungs daily.    Dispense:  90 each    Refill:  1   Return in about 6 months (around 08/07/2023).  I have spent a total time of 35-minutes on the day of the appointment including chart review, data review, collecting history, coordinating care and discussing medical diagnosis and plan with the patient/family. Past medical history, allergies, medications were reviewed. Pertinent imaging, labs and tests included in this note have been reviewed and interpreted independently by me.  Mechele Collin, MD [facility] Pulmonary/Critical Care Medicine 02/04/2023 1:18 PM

## 2023-03-03 ENCOUNTER — Other Ambulatory Visit: Payer: Self-pay

## 2023-03-03 ENCOUNTER — Inpatient Hospital Stay (HOSPITAL_BASED_OUTPATIENT_CLINIC_OR_DEPARTMENT_OTHER): Payer: Medicare Other | Admitting: Medical Oncology

## 2023-03-03 ENCOUNTER — Encounter: Payer: Self-pay | Admitting: Medical Oncology

## 2023-03-03 ENCOUNTER — Inpatient Hospital Stay: Payer: Medicare Other | Attending: Hematology & Oncology

## 2023-03-03 VITALS — BP 91/45 | HR 68 | Temp 98.0°F | Resp 19 | Ht 59.0 in | Wt 185.1 lb

## 2023-03-03 DIAGNOSIS — R609 Edema, unspecified: Secondary | ICD-10-CM | POA: Diagnosis not present

## 2023-03-03 DIAGNOSIS — Z86718 Personal history of other venous thrombosis and embolism: Secondary | ICD-10-CM | POA: Insufficient documentation

## 2023-03-03 DIAGNOSIS — R112 Nausea with vomiting, unspecified: Secondary | ICD-10-CM | POA: Insufficient documentation

## 2023-03-03 DIAGNOSIS — N186 End stage renal disease: Secondary | ICD-10-CM | POA: Diagnosis not present

## 2023-03-03 DIAGNOSIS — Z992 Dependence on renal dialysis: Secondary | ICD-10-CM | POA: Insufficient documentation

## 2023-03-03 DIAGNOSIS — D6851 Activated protein C resistance: Secondary | ICD-10-CM | POA: Diagnosis not present

## 2023-03-03 DIAGNOSIS — I82A19 Acute embolism and thrombosis of unspecified axillary vein: Secondary | ICD-10-CM

## 2023-03-03 DIAGNOSIS — D509 Iron deficiency anemia, unspecified: Secondary | ICD-10-CM

## 2023-03-03 DIAGNOSIS — Z7901 Long term (current) use of anticoagulants: Secondary | ICD-10-CM

## 2023-03-03 LAB — RETICULOCYTES
Immature Retic Fract: 27.6 % — ABNORMAL HIGH (ref 2.3–15.9)
RBC.: 3.58 MIL/uL — ABNORMAL LOW (ref 3.87–5.11)
Retic Count, Absolute: 107.8 10*3/uL (ref 19.0–186.0)
Retic Ct Pct: 3 % (ref 0.4–3.1)

## 2023-03-03 LAB — IRON AND IRON BINDING CAPACITY (CC-WL,HP ONLY)
Iron: 46 ug/dL (ref 28–170)
Saturation Ratios: 28 % (ref 10.4–31.8)
TIBC: 167 ug/dL — ABNORMAL LOW (ref 250–450)
UIBC: 121 ug/dL — ABNORMAL LOW (ref 148–442)

## 2023-03-03 LAB — CBC WITH DIFFERENTIAL (CANCER CENTER ONLY)
Abs Immature Granulocytes: 0.03 10*3/uL (ref 0.00–0.07)
Basophils Absolute: 0.1 10*3/uL (ref 0.0–0.1)
Basophils Relative: 1 %
Eosinophils Absolute: 0.3 10*3/uL (ref 0.0–0.5)
Eosinophils Relative: 4 %
HCT: 33.6 % — ABNORMAL LOW (ref 36.0–46.0)
Hemoglobin: 10.8 g/dL — ABNORMAL LOW (ref 12.0–15.0)
Immature Granulocytes: 0 %
Lymphocytes Relative: 18 %
Lymphs Abs: 1.2 10*3/uL (ref 0.7–4.0)
MCH: 29.6 pg (ref 26.0–34.0)
MCHC: 32.1 g/dL (ref 30.0–36.0)
MCV: 92.1 fL (ref 80.0–100.0)
Monocytes Absolute: 0.5 10*3/uL (ref 0.1–1.0)
Monocytes Relative: 7 %
Neutro Abs: 4.7 10*3/uL (ref 1.7–7.7)
Neutrophils Relative %: 70 %
Platelet Count: 181 10*3/uL (ref 150–400)
RBC: 3.65 MIL/uL — ABNORMAL LOW (ref 3.87–5.11)
RDW: 17 % — ABNORMAL HIGH (ref 11.5–15.5)
WBC Count: 6.7 10*3/uL (ref 4.0–10.5)
nRBC: 0 % (ref 0.0–0.2)

## 2023-03-03 LAB — CMP (CANCER CENTER ONLY)
ALT: 5 U/L (ref 0–44)
AST: 11 U/L — ABNORMAL LOW (ref 15–41)
Albumin: 4.2 g/dL (ref 3.5–5.0)
Alkaline Phosphatase: 92 U/L (ref 38–126)
Anion gap: 22 — ABNORMAL HIGH (ref 5–15)
BUN: 31 mg/dL — ABNORMAL HIGH (ref 8–23)
CO2: 26 mmol/L (ref 22–32)
Calcium: 9.4 mg/dL (ref 8.9–10.3)
Chloride: 93 mmol/L — ABNORMAL LOW (ref 98–111)
Creatinine: 5.37 mg/dL — ABNORMAL HIGH (ref 0.44–1.00)
GFR, Estimated: 8 mL/min — ABNORMAL LOW (ref >60)
Glucose, Bld: 123 mg/dL — ABNORMAL HIGH (ref 70–99)
Potassium: 4.8 mmol/L (ref 3.5–5.1)
Sodium: 141 mmol/L (ref 135–145)
Total Bilirubin: 0.5 mg/dL (ref 0.3–1.2)
Total Protein: 7 g/dL (ref 6.5–8.1)

## 2023-03-03 LAB — FERRITIN: Ferritin: 1002 ng/mL — ABNORMAL HIGH (ref 11–307)

## 2023-03-03 LAB — LACTATE DEHYDROGENASE: LDH: 122 U/L (ref 98–192)

## 2023-03-03 NOTE — Progress Notes (Signed)
Hematology and Oncology Follow Up Visit  Whitney Chavez [id number] 02-12-1956 66 y.o. 03/03/2023   Principle Diagnosis:  Recurrent thromboembolic disease Heterozygous factor V Leiden mutation Prothrombin II gene mutation Calciphylaxis   Current Therapy:        Eliquis 5 mg PO BID   Interim History:  Whitney Chavez is here today for follow-up. She is doing well and has no complaints at this time.   She continues to do well with Dialysis.  She has some chronic fluid retention in her legs and wraps her legs or wears her compression stockings daily for added support. No blood loss noted. No abnormal bruising, no petechiae. Appetite comes and goes. She has occasional episodes of n/v and states that her work up with GI so far has negative.  She does her best to hydrate properly on fluid restrictions.  No fever, chills, cough, rash, dizziness, SOB, chest pain or changes in bowel habits  No numbness or tingling in her extremities at this time.  No falls or syncope reported.   Wt Readings from Last 3 Encounters:  03/03/23 185 lb 1.3 oz (84 kg)  02/03/23 189 lb 6.4 oz (85.9 kg)  11/12/22 190 lb 11.2 oz (86.5 kg)    ECOG Performance Status: 1 - Symptomatic but completely ambulatory  Medications:  Allergies as of 03/03/2023       Reactions   Estrogens Other (See Comments)   Blood clots   Other Other (See Comments)   Unable to take birth control pills due to clotting disorder/fim   Prednisone Other (See Comments)   Facial edema - has had this 2020 and had no difficulty "can take in small doses"   Progesterone Other (See Comments)   Blood clots   Propofol Other (See Comments)   Became disinhibited and exacerbated restless leg syndrome during EGD/colonoscopy. Patient has requested an alternate form of sedation during future procedures. Legs started thrashing and skin tears.   Lisinopril Cough   Clotrimazole Rash   Tape Rash, Other (See Comments)   Please use paper tape         Medication List        Accurate as of March 03, 2023 11:18 AM. If you have any questions, ask your nurse or doctor.          STOP taking these medications    heparin 5000 UNIT/ML injection Stopped by: Rushie Chestnut, PA-C       TAKE these medications    acetaminophen 500 MG tablet Commonly known as: TYLENOL Take 500 mg by mouth every 6 (six) hours as needed for moderate pain.   amiodarone 200 MG tablet Commonly known as: PACERONE Take 200 mg by mouth in the morning.   apixaban 5 MG Tabs tablet Commonly known as: Eliquis Take 1 tablet (5 mg total) by mouth 2 (two) times daily.   atorvastatin 20 MG tablet Commonly known as: LIPITOR Take 20 mg by mouth every evening.   budesonide-formoterol 160-4.5 MCG/ACT inhaler Commonly known as: Symbicort Inhale 2 puffs into the lungs in the morning and at bedtime.   cetirizine 10 MG tablet Commonly known as: ZYRTEC Take 10 mg by mouth in the morning.   Epogen 2000 UNIT/ML injection Generic drug: epoetin alfa 2,000 Units 3 (three) times a week.   gabapentin 100 MG capsule Commonly known as: NEURONTIN Take 1 capsule (100 mg total) by mouth 4 (four) times daily. What changed:  when to take this additional instructions   Incruse Ellipta 62.5 MCG/ACT  Aepb Generic drug: umeclidinium bromide Inhale 1 puff into the lungs daily.   lanthanum 500 MG chewable tablet Commonly known as: FOSRENOL Chew 1,000-2,000 mg by mouth 2 (two) times daily with a meal.  500 mg with each snack   Medical Compression Stockings Misc by Does not apply route. Leg wraps.   omeprazole 20 MG capsule Commonly known as: PRILOSEC Take 20 mg by mouth daily as needed (acid reflux). Rarely takes   ondansetron 4 MG disintegrating tablet Commonly known as: ZOFRAN-ODT Take 4 mg by mouth every Monday, Wednesday, and Friday.   oxyCODONE 5 MG immediate release tablet Commonly known as: Roxicodone Take one po qd formore severe pain What changed:   how much to take how to take this when to take this reasons to take this additional instructions   PARSABIV IV Inject 1 Dose into the vein 3 (three) times a week. Receives at Dialysis   PRESCRIPTION MEDICATION Inhale into the lungs at bedtime. CPAP with nasal pillow   RENAL VITAMIN PO Take 1 tablet by mouth daily.   rOPINIRole 0.5 MG tablet Commonly known as: REQUIP TAKE 1-2 TABLETS BY MOUTH IN THE MORNING. What changed:  how much to take how to take this when to take this additional instructions   rOPINIRole 2 MG tablet Commonly known as: REQUIP Take 1 tablet (2 mg total) by mouth at bedtime. What changed: Another medication with the same name was changed. Make sure you understand how and when to take each.   SODIUM THIOSULFATE IV Inject 1 Dose into the vein 3 (three) times a week. Receives at Dialysis   sulfaSALAzine 500 MG tablet Commonly known as: AZULFIDINE Take 500 mg by mouth 4 (four) times daily.   traMADol 50 MG tablet Commonly known as: ULTRAM TAKE 1 TABLET (50 MG TOTAL) BY MOUTH 2 (TWO) TIMES DAILY AS NEEDED (PAIN). What changed:  when to take this additional instructions   triamcinolone cream 0.1 % Commonly known as: KENALOG Apply 1 Application topically daily as needed (itching).        Allergies:  Allergies  Allergen Reactions   Estrogens Other (See Comments)    Blood clots   Other Other (See Comments)    Unable to take birth control pills due to clotting disorder/fim   Prednisone Other (See Comments)    Facial edema - has had this 2020 and had no difficulty "can take in small doses"   Progesterone Other (See Comments)    Blood clots   Propofol Other (See Comments)    Became disinhibited and exacerbated restless leg syndrome during EGD/colonoscopy. Patient has requested an alternate form of sedation during future procedures.  Legs started thrashing and skin tears.   Lisinopril Cough   Clotrimazole Rash   Tape Rash and Other (See  Comments)    Please use paper tape    Past Medical History, Surgical history, Social history, and Family History were reviewed and updated.  Review of Systems: All other 10 point review of systems is negative.   Physical Exam:  height is 4\' 11"  (1.499 m) and weight is 185 lb 1.3 oz (84 kg). Her oral temperature is 98 F (36.7 C). Her blood pressure is 91/45 (abnormal) and her pulse is 68. Her respiration is 19 and oxygen saturation is 98%.   Wt Readings from Last 3 Encounters:  03/03/23 185 lb 1.3 oz (84 kg)  02/03/23 189 lb 6.4 oz (85.9 kg)  11/12/22 190 lb 11.2 oz (86.5 kg)    Ocular: Sclerae  unicteric, pupils equal, round and reactive to light Ear-nose-throat: Oropharynx clear, dentition fair Lymphatic: No cervical or supraclavicular adenopathy Lungs no rales or rhonchi, good excursion bilaterally Heart regular rate and rhythm, no murmur appreciated Abd soft, nontender, positive bowel sounds MSK no focal spinal tenderness, no joint edema Neuro: non-focal, well-oriented, appropriate affect  Lab Results  Component Value Date   WBC 6.7 03/03/2023   HGB 10.8 (L) 03/03/2023   HCT 33.6 (L) 03/03/2023   MCV 92.1 03/03/2023   PLT 181 03/03/2023   Lab Results  Component Value Date   FERRITIN 1,172 (H) 09/01/2022   IRON 86 09/01/2022   TIBC 155 (L) 09/01/2022   UIBC 69 (L) 09/01/2022   IRONPCTSAT 55 (H) 09/01/2022   Lab Results  Component Value Date   RETICCTPCT 3.0 03/03/2023   RBC 3.58 (L) 03/03/2023   RBC 3.65 (L) 03/03/2023   No results found for: "KPAFRELGTCHN", "LAMBDASER", "KAPLAMBRATIO" No results found for: "IGGSERUM", "IGA", "IGMSERUM" No results found for: "TOTALPROTELP", "ALBUMINELP", "A1GS", "A2GS", "BETS", "BETA2SER", "GAMS", "MSPIKE", "SPEI"   Chemistry      Component Value Date/Time   NA 139 11/12/2022 1136   NA 144 06/25/2017 1146   K 3.6 11/12/2022 1136   K 4.1 06/25/2017 1146   CL 99 11/12/2022 1136   CL 103 06/25/2017 1146   CO2 23 11/12/2022  1136   CO2 31 06/25/2017 1146   BUN 11 11/12/2022 1136   BUN 27 (H) 06/25/2017 1146   CREATININE 2.70 (H) 11/12/2022 1136   CREATININE 3.34 (HH) 09/01/2022 1249   CREATININE 4.7 (HH) 06/25/2017 1146      Component Value Date/Time   CALCIUM 8.9 11/12/2022 1136   CALCIUM 9.7 06/25/2017 1146   ALKPHOS 114 09/01/2022 1249   ALKPHOS 60 06/25/2017 1146   AST 15 09/01/2022 1249   ALT 5 09/01/2022 1249   ALT 14 06/25/2017 1146   BILITOT 0.5 09/01/2022 1249     Impression and Plan: Whitney Chavez is a very pleasant 67 yo caucasian female with history of recurrent thrombotic events. She has both Factor V Leiden as well as the prothrombin II gene mutation.   She is doing well on Eliquis and so far there has been no new recurrence. She will continue her same regimen.  Follow-up in 6 months.   Disposition RTC 6 months APP, labs ( CBC w/, CMP, LDH, ferritin, iron, retic)-[facility]   Rushie Chestnut, PA-C 6/18/202411:18 AM

## 2023-03-05 ENCOUNTER — Encounter (HOSPITAL_BASED_OUTPATIENT_CLINIC_OR_DEPARTMENT_OTHER): Payer: Medicare Other

## 2023-03-11 ENCOUNTER — Encounter (HOSPITAL_BASED_OUTPATIENT_CLINIC_OR_DEPARTMENT_OTHER): Payer: Medicare Other

## 2023-05-05 ENCOUNTER — Ambulatory Visit: Payer: Medicare Other | Admitting: Neurology

## 2023-05-12 ENCOUNTER — Other Ambulatory Visit: Payer: Self-pay | Admitting: Neurology

## 2023-05-12 NOTE — Telephone Encounter (Signed)
Last seen on 10/30/22 Follow up scheduled on 07/25/23 Last filled on 04/04/23 #60 tablets (30 day supply) Rx pending to be signed

## 2023-05-25 DIAGNOSIS — I120 Hypertensive chronic kidney disease with stage 5 chronic kidney disease or end stage renal disease: Secondary | ICD-10-CM | POA: Insufficient documentation

## 2023-05-25 DIAGNOSIS — Z992 Dependence on renal dialysis: Secondary | ICD-10-CM | POA: Insufficient documentation

## 2023-07-23 ENCOUNTER — Encounter: Payer: Self-pay | Admitting: Family Medicine

## 2023-08-03 NOTE — Progress Notes (Unsigned)
PATIENT: Whitney Chavez DOB: October 25, 1955  REASON FOR VISIT: follow up HISTORY FROM: patient  No chief complaint on file.    HISTORY OF PRESENT ILLNESS:  08/03/23 ALL: Whitney Chavez returns for follow up for OSA on CPAP, insomnia, RLS and polyneuropathy. She continues to do well on CPAP therapy. She is sleeping well. She has had some trouble with DME getting her supplies. Was with [facility], now [facility]. She uses a small nasal pillow and they do not provide these individually.   She continues gabapentin 300mg  and ropinirole 2mg  at bedtime. RLS is stable. She takes an extra 100mg  dose of bapatenin and 0.5mg  dose of ropinirole on dialysis if needed. She has not had to use as often since discontinuing nortriptyline. She uses tramadol for breakthrough pain, usually 50mg  BID. She has had some waxing and waning numbness and swelling of the right hand. She feels that it is worse with rainy weather. She worked in an office for years. Tylenol helps.   She has lost over 100 pounds. She is hoping to qualify for kidney transplant in the future. She continues to use Rolator but working on [employer]. She uses a stationary peddler at home.   HISTORY: (copied from Dr Bonnita Hollow previous note)  Whitney Chavez is a 67 year old woman with Obstructive sleep apnea , insomnia, restless leg syndrome and polyneuropathy.       Update 10/30/2022 She has OSA and is using CPAP nightly and can' sleep well without it.   Download shows compliance was 93% and efficacy was good (AHI = 1.6).      She uses the [employer] mask (nasal pillows).   She has second machine x 5 years (Airsense 10)   She has more RLS symptoms during HD and at night.  She takes ropinirole 1/2 pill before HD and 1/2 pill afterwards and at bedtime.  That and gabapentin have helped.  Tramadol 50 mg po bid has helped.     She takes gabapentin 300 mg a day (100 in pm and 200 at bedtime) and ropinirole 0.5 mg in pm and 2 mg at bedtime)   She is on HD for  ESRD and is trying to get on the transplant list (was able to lose weight).   She takes additional ropinirole 0.5 mg before HD.       Neuropathy pain has done better with the gabapentin and tramadol.      She did PT and felt it helped.   Besides the neuropathy, she also has advanced knee DTR.    With PT  has strengthened some and is doig better with standing up (less time).   Silver sneakers was not covered so she bought a pedal exercise equipment.     She also has a frozen right shoulder that is bothersome at times.       She had calciphylaxis due to HD (x 11+ years)  and required surgery .    This led to CRF/HD.    She has mild EDS.   EPWORTH SLEEPINESS SCALE   On a scale of 0 - 3 what is the chance of dozing:   Sitting and Reading:                           3 Watching TV:  2 Sitting inactive in a public place:2 Passenger in car for one hour:2 Lying down to rest in the afternoon:2 Sitting and talking to someone:1 Sitting quietly after lunch:                   1 In a car, stopped in traffic:                  0   Total (out of 24):  13/24   Mild EDS (generally worse on HD days and better on non-HD days)        She has had vomiting but EGD showed no problems.   She is seeing GI.   She takes Zofran on dialysis days.    REVIEW OF SYSTEMS: Out of a complete 14 system review of symptoms, the patient complains only of the following symptoms, restless legs, neuropathy, numbness of right wrist and hand and all other reviewed systems are negative.  Epworth sleepiness scale: 9  ALLERGIES: Allergies  Allergen Reactions   Estrogens Other (See Comments)    Blood clots   Other Other (See Comments)    Unable to take birth control pills due to clotting disorder/fim   Prednisone Other (See Comments)    Facial edema - has had this 2020 and had no difficulty "can take in small doses"   Progesterone Other (See Comments)    Blood clots   Propofol Other (See  Comments)    Became disinhibited and exacerbated restless leg syndrome during EGD/colonoscopy. Patient has requested an alternate form of sedation during future procedures.  Legs started thrashing and skin tears.   Lisinopril Cough   Clotrimazole Rash   Tape Rash and Other (See Comments)    Please use paper tape    HOME MEDICATIONS: Outpatient Medications Prior to Visit  Medication Sig Dispense Refill   acetaminophen (TYLENOL) 500 MG tablet Take 500 mg by mouth every 6 (six) hours as needed for moderate pain.     amiodarone (PACERONE) 200 MG tablet Take 200 mg by mouth in the morning.     apixaban (ELIQUIS) 5 MG TABS tablet Take 1 tablet (5 mg total) by mouth 2 (two) times daily. 60 tablet 0   atorvastatin (LIPITOR) 20 MG tablet Take 20 mg by mouth every evening.     B Complex-C-Folic Acid (RENAL VITAMIN PO) Take 1 tablet by mouth daily.     budesonide-formoterol (SYMBICORT) 160-4.5 MCG/ACT inhaler Inhale 2 puffs into the lungs in the morning and at bedtime. 30.6 g 3   cetirizine (ZYRTEC) 10 MG tablet Take 10 mg by mouth in the morning.     Elastic Bandages & Supports (MEDICAL COMPRESSION STOCKINGS) MISC by Does not apply route. Leg wraps.     epoetin alfa (EPOGEN) 2000 UNIT/ML injection 2,000 Units 3 (three) times a week.     Etelcalcetide HCl (PARSABIV IV) Inject 1 Dose into the vein 3 (three) times a week. Receives at Dialysis     gabapentin (NEURONTIN) 100 MG capsule Take 1 capsule (100 mg total) by mouth 4 (four) times daily. (Patient taking differently: Take 100 mg by mouth See admin instructions. 100 mg at 1500 and 200 mg at bedtime) 360 capsule 3   lanthanum (FOSRENOL) 500 MG chewable tablet Chew 1,000-2,000 mg by mouth 2 (two) times daily with a meal.  500 mg with each snack     omeprazole (PRILOSEC) 20 MG capsule Take 20 mg by mouth daily as needed (acid reflux). Rarely takes  ondansetron (ZOFRAN-ODT) 4 MG disintegrating tablet Take 4 mg by mouth every Monday, Wednesday, and  Friday.     oxyCODONE (ROXICODONE) 5 MG immediate release tablet Take one po qd formore severe pain (Patient taking differently: Take 5 mg by mouth as needed for moderate pain.) 30 tablet 0   PRESCRIPTION MEDICATION Inhale into the lungs at bedtime. CPAP with nasal pillow     rOPINIRole (REQUIP) 0.5 MG tablet TAKE 1-2 TABLETS BY MOUTH IN THE MORNING. (Patient taking differently: Take 0.5 mg by mouth See admin instructions. Take at 1500) 180 tablet 4   rOPINIRole (REQUIP) 2 MG tablet Take 1 tablet (2 mg total) by mouth at bedtime. 90 tablet 3   SODIUM THIOSULFATE IV Inject 1 Dose into the vein 3 (three) times a week. Receives at Dialysis     sulfaSALAzine (AZULFIDINE) 500 MG tablet Take 500 mg by mouth 4 (four) times daily.     traMADol (ULTRAM) 50 MG tablet TAKE 1 TABLET (50 MG TOTAL) BY MOUTH 2 (TWO) TIMES DAILY AS NEEDED (PAIN). 60 tablet 5   triamcinolone cream (KENALOG) 0.1 % Apply 1 Application topically daily as needed (itching).     umeclidinium bromide (INCRUSE ELLIPTA) 62.5 MCG/ACT AEPB Inhale 1 puff into the lungs daily. 90 each 1   No facility-administered medications prior to visit.    PAST MEDICAL HISTORY: Past Medical History:  Diagnosis Date   Asthma    Axillary vein thrombosis (HCC) 08/16/2015   Calciphylaxis    Chronic kidney disease    Colitis    COPD (chronic obstructive pulmonary disease) (HCC)    Factor V Leiden (HCC)    Hypertension    Lymphedema    legs   MRSA (methicillin resistant staph aureus) culture positive    Neuropathy    Obstructive sleep apnea 03/29/2015   OSA on CPAP    Vision abnormalities     PAST SURGICAL HISTORY: Past Surgical History:  Procedure Laterality Date   APPLICATION OF WOUND VAC     AV FISTULA PLACEMENT Left    BIOPSY  11/05/2022   Procedure: BIOPSY;  Surgeon: Willis Modena, MD;  Location: [facility];  Service: Gastroenterology;;   COLONOSCOPY WITH PROPOFOL Bilateral 11/05/2022   Procedure: COLONOSCOPY WITH PROPOFOL;   Surgeon: Willis Modena, MD;  Location: [facility];  Service: Gastroenterology;  Laterality: Bilateral;   ESOPHAGOGASTRODUODENOSCOPY (EGD) WITH PROPOFOL Bilateral 03/19/2022   Procedure: ESOPHAGOGASTRODUODENOSCOPY (EGD) WITH PROPOFOL;  Surgeon: Willis Modena, MD;  Location: [facility];  Service: Gastroenterology;  Laterality: Bilateral;   lumbar decompression fusion     TONSILLECTOMY      FAMILY HISTORY: Family History  Problem Relation Age of Onset   Diabetes Mother    Congestive Heart Failure Mother    COPD Mother    Stroke Mother    Neuropathy Mother    Heart disease Father    Stroke Father    Cancer Brother    Diabetes Brother    Diabetes Brother    Alcohol abuse Brother     SOCIAL HISTORY: Social History   Socioeconomic History   Marital status: Single    Spouse name: Not on file   Number of children: Not on file   Years of education: Not on file   Highest education level: Not on file  Occupational History   Not on file  Tobacco Use   Smoking status: Former    Current packs/day: 0.00    Average packs/day: 2.0 packs/day for 30.0 years (60.0 ttl pk-yrs)  Types: Cigarettes    Start date: 11/24/1968    Quit date: 11/25/1998    Years since quitting: 24.7   Smokeless tobacco: Never  Vaping Use   Vaping status: Never Used  Substance and Sexual Activity   Alcohol use: No    Alcohol/week: 0.0 standard drinks of alcohol   Drug use: No   Sexual activity: Not on file  Other Topics Concern   Not on file  Social History Narrative   Not on file   Social Determinants of Health   Financial Resource Strain: Not on file  Food Insecurity: No Food Insecurity (06/07/2020)   Received from [facility] visits prior to 11/15/2022., [facility] visits prior to 11/15/2022.   Hunger Vital Sign    Worried About Running Out of Food in the Last Year: Never true    Ran Out of Food in the Last Year: Never true  Transportation Needs: Not on  file  Physical Activity: Not on file  Stress: Not on file  Social Connections: Unknown (01/13/2022)   Received from [facility], [facility]   Social Network    Social Network: Not on file  Intimate Partner Violence: Unknown (12/20/2021)   Received from [facility], [facility]   HITS    Physically Hurt: Not on file    Insult or Talk Down To: Not on file    Threaten Physical Harm: Not on file    Scream or Curse: Not on file      PHYSICAL EXAM  There were no vitals filed for this visit.   There is no height or weight on file to calculate BMI.  Generalized: Well developed, in no acute distress  Cardiology: normal rate and rhythm, no murmur noted Respiratory: clear to auscultation bilaterally  Neurological examination  Mentation: Alert oriented to time, place, history taking. Follows all commands speech and language fluent Cranial nerve II-XII: Pupils were equal round reactive to light. Extraocular movements were full, visual field were full . Motor: The motor testing reveals 5 over 5 strength of all 4 extremities. Good symmetric motor tone is noted throughout.  Gait and station: Gait is stable with Rolator, Tandem not attempted.  Vascular: left fistula with good bruit auscultated and thrill palpated.  MSK: edema noted of right thumb, mostly at base, mild tenderness with palpation.   DIAGNOSTIC DATA (LABS, IMAGING, TESTING) - I reviewed patient records, labs, notes, testing and imaging myself where available.      No data to display           Lab Results  Component Value Date   WBC 6.7 03/03/2023   HGB 10.8 (L) 03/03/2023   HCT 33.6 (L) 03/03/2023   MCV 92.1 03/03/2023   PLT 181 03/03/2023      Component Value Date/Time   NA 141 03/03/2023 1041   NA 144 06/25/2017 1146   K 4.8 03/03/2023 1041   K 4.1 06/25/2017 1146   CL 93 (L) 03/03/2023 1041   CL 103 06/25/2017 1146   CO2 26 03/03/2023 1041   CO2 31 06/25/2017 1146   GLUCOSE 123 (H) 03/03/2023 1041    GLUCOSE 119 (H) 06/25/2017 1146   BUN 31 (H) 03/03/2023 1041   BUN 27 (H) 06/25/2017 1146   CREATININE 5.37 (H) 03/03/2023 1041   CREATININE 4.7 (HH) 06/25/2017 1146   CALCIUM 9.4 03/03/2023 1041   CALCIUM 9.7 06/25/2017 1146   PROT 7.0 03/03/2023 1041   PROT 7.0 06/25/2017 1146  ALBUMIN 4.2 03/03/2023 1041   ALBUMIN 3.6 06/25/2017 1146   AST 11 (L) 03/03/2023 1041   ALT 5 03/03/2023 1041   ALT 14 06/25/2017 1146   ALKPHOS 92 03/03/2023 1041   ALKPHOS 60 06/25/2017 1146   BILITOT 0.5 03/03/2023 1041   GFRNONAA 8 (L) 03/03/2023 1041   GFRAA 10 (L) 01/12/2020 0851   No results found for: "CHOL", "HDL", "LDLCALC", "LDLDIRECT", "TRIG", "CHOLHDL" No results found for: "HGBA1C" No results found for: "VITAMINB12" No results found for: "TSH"     ASSESSMENT AND PLAN 67 y.o. year old female  has a past medical history of Asthma, Axillary vein thrombosis (HCC) (08/16/2015), Calciphylaxis, Chronic kidney disease, Colitis, COPD (chronic obstructive pulmonary disease) (HCC), Factor V Leiden (HCC), Hypertension, Lymphedema, MRSA (methicillin resistant staph aureus) culture positive, Neuropathy, Obstructive sleep apnea (03/29/2015), OSA on CPAP, and Vision abnormalities. here with   No diagnosis found.   Samica is doing very well. She will continue ropinirole 2 mg and gabapentin 300 mg at bedtime.  May take extra 100mg  dose of gabapentin and 0.5mg  dose of ropinirole as needed. Continue tramadol 50mg  BID as needed. PDMP appropriate. Last refill 10/05/2021. She will call when due. Compliance report reveals excellent compliance.  She is doing very well with her CPAP machine.  She was encouraged to continue using CPAP nightly and for greater than 4 hours each night.  She will reach out to [facility] regarding concerns obtaining nasal pillow mask. She will let me know if this goes unresolved. Can consider transfer of care to different DME if needed. She will continue to monitor right hand numbness and let  me know if it worsens. She will continue close follow-up with primary care and nephrology as directed.  She will follow-up with [place] in 6 months, sooner if needed.  She verbalizes understanding and agreement with this plan.   No orders of the defined types were placed in this encounter.    No orders of the defined types were placed in this encounter.    Shawnie Dapper, FNP-C 08/03/2023, 12:50 PM [facility] [address], [address] [place], [place] [address] [phone]

## 2023-08-03 NOTE — Patient Instructions (Signed)

## 2023-08-04 ENCOUNTER — Encounter: Payer: Self-pay | Admitting: Family Medicine

## 2023-08-04 ENCOUNTER — Ambulatory Visit (INDEPENDENT_AMBULATORY_CARE_PROVIDER_SITE_OTHER): Payer: Medicare Other | Admitting: Family Medicine

## 2023-08-04 VITALS — BP 114/69 | HR 69 | Ht 60.0 in | Wt 183.0 lb

## 2023-08-04 DIAGNOSIS — G2581 Restless legs syndrome: Secondary | ICD-10-CM | POA: Diagnosis not present

## 2023-08-04 DIAGNOSIS — G4733 Obstructive sleep apnea (adult) (pediatric): Secondary | ICD-10-CM | POA: Diagnosis not present

## 2023-08-04 DIAGNOSIS — G629 Polyneuropathy, unspecified: Secondary | ICD-10-CM

## 2023-08-04 MED ORDER — ROPINIROLE HCL 0.5 MG PO TABS: ORAL_TABLET | ORAL | 4 refills | Status: DC

## 2023-08-04 MED ORDER — TRAMADOL HCL 50 MG PO TABS: 50.0000 mg | ORAL_TABLET | Freq: Two times a day (BID) | ORAL | 0 refills | Status: DC | PRN

## 2023-08-04 MED ORDER — ROPINIROLE HCL 2 MG PO TABS: 2.0000 mg | ORAL_TABLET | Freq: Every day | ORAL | 3 refills | Status: DC

## 2023-08-04 MED ORDER — GABAPENTIN 100 MG PO CAPS: 100.0000 mg | ORAL_CAPSULE | Freq: Four times a day (QID) | ORAL | 3 refills | Status: DC

## 2023-09-01 ENCOUNTER — Inpatient Hospital Stay: Payer: Medicare Other | Attending: Hematology & Oncology

## 2023-09-01 ENCOUNTER — Encounter: Payer: Self-pay | Admitting: Medical Oncology

## 2023-09-01 ENCOUNTER — Inpatient Hospital Stay (HOSPITAL_BASED_OUTPATIENT_CLINIC_OR_DEPARTMENT_OTHER): Payer: Medicare Other | Admitting: Medical Oncology

## 2023-09-01 ENCOUNTER — Other Ambulatory Visit: Payer: Self-pay

## 2023-09-01 ENCOUNTER — Other Ambulatory Visit: Payer: Self-pay | Admitting: *Deleted

## 2023-09-01 VITALS — BP 116/65 | HR 71 | Temp 98.2°F | Resp 16 | Ht 61.0 in | Wt 183.0 lb

## 2023-09-01 DIAGNOSIS — D509 Iron deficiency anemia, unspecified: Secondary | ICD-10-CM | POA: Diagnosis not present

## 2023-09-01 DIAGNOSIS — D6851 Activated protein C resistance: Secondary | ICD-10-CM

## 2023-09-01 DIAGNOSIS — D649 Anemia, unspecified: Secondary | ICD-10-CM | POA: Insufficient documentation

## 2023-09-01 DIAGNOSIS — N186 End stage renal disease: Secondary | ICD-10-CM | POA: Diagnosis not present

## 2023-09-01 DIAGNOSIS — Z7901 Long term (current) use of anticoagulants: Secondary | ICD-10-CM | POA: Insufficient documentation

## 2023-09-01 DIAGNOSIS — Z992 Dependence on renal dialysis: Secondary | ICD-10-CM

## 2023-09-01 LAB — CBC WITH DIFFERENTIAL (CANCER CENTER ONLY)
Abs Immature Granulocytes: 0.05 10*3/uL (ref 0.00–0.07)
Basophils Absolute: 0.1 10*3/uL (ref 0.0–0.1)
Basophils Relative: 1 %
Eosinophils Absolute: 0.2 10*3/uL (ref 0.0–0.5)
Eosinophils Relative: 3 %
HCT: 30.1 % — ABNORMAL LOW (ref 36.0–46.0)
Hemoglobin: 9.4 g/dL — ABNORMAL LOW (ref 12.0–15.0)
Immature Granulocytes: 1 %
Lymphocytes Relative: 17 %
Lymphs Abs: 1.3 10*3/uL (ref 0.7–4.0)
MCH: 28.5 pg (ref 26.0–34.0)
MCHC: 31.2 g/dL (ref 30.0–36.0)
MCV: 91.2 fL (ref 80.0–100.0)
Monocytes Absolute: 0.7 10*3/uL (ref 0.1–1.0)
Monocytes Relative: 9 %
Neutro Abs: 5 10*3/uL (ref 1.7–7.7)
Neutrophils Relative %: 69 %
Platelet Count: 200 10*3/uL (ref 150–400)
RBC: 3.3 MIL/uL — ABNORMAL LOW (ref 3.87–5.11)
RDW: 17.2 % — ABNORMAL HIGH (ref 11.5–15.5)
WBC Count: 7.2 10*3/uL (ref 4.0–10.5)
nRBC: 0 % (ref 0.0–0.2)

## 2023-09-01 LAB — CMP (CANCER CENTER ONLY)
ALT: <5 U/L (ref 0–44)
AST: 10 U/L — ABNORMAL LOW (ref 15–41)
Albumin: 3.9 g/dL (ref 3.5–5.0)
Alkaline Phosphatase: 70 U/L (ref 38–126)
Anion gap: 17 — ABNORMAL HIGH (ref 5–15)
BUN: 32 mg/dL — ABNORMAL HIGH (ref 8–23)
CO2: 25 mmol/L (ref 22–32)
Calcium: 8.7 mg/dL — ABNORMAL LOW (ref 8.9–10.3)
Chloride: 98 mmol/L (ref 98–111)
Creatinine: 4.66 mg/dL — ABNORMAL HIGH (ref 0.44–1.00)
GFR, Estimated: 10 mL/min — ABNORMAL LOW (ref >60)
Glucose, Bld: 89 mg/dL (ref 70–99)
Potassium: 4.8 mmol/L (ref 3.5–5.1)
Sodium: 140 mmol/L (ref 135–145)
Total Bilirubin: 0.7 mg/dL (ref <1.2)
Total Protein: 6.9 g/dL (ref 6.5–8.1)

## 2023-09-01 LAB — IRON AND IRON BINDING CAPACITY (CC-WL,HP ONLY)
Iron: 32 ug/dL (ref 28–170)
Saturation Ratios: 20 % (ref 10.4–31.8)
TIBC: 160 ug/dL — ABNORMAL LOW (ref 250–450)
UIBC: 128 ug/dL — ABNORMAL LOW (ref 148–442)

## 2023-09-01 LAB — FERRITIN: Ferritin: 724 ng/mL — ABNORMAL HIGH (ref 11–307)

## 2023-09-01 NOTE — Progress Notes (Signed)
Hematology and Oncology Follow Up Visit  Whitney Chavez [id number] Mar 09, 1956 67 y.o. 09/01/2023   Principle Diagnosis:  Recurrent thromboembolic disease Heterozygous factor V Leiden mutation Prothrombin II gene mutation Calciphylaxis   Current Therapy:        Eliquis 5 mg PO BID   Interim History:  Whitney Chavez is here today for follow-up. She is doing well and has no complaints at this time.    Does EPO at her nephrology office. She missed this due to travel.  They are watching this closely as they return to her previous values. She reports that it can take about 1 month for her values to return to normal.  She continues to do well with Dialysis.   She has some chronic fluid retention in her legs and wraps her legs or wears her compression stockings daily for added support.  No blood loss noted. No abnormal bruising, no petechiae. Appetite comes and goes. She has occasional episodes of n/v and states that her work up with GI so far has negative.  She does her best to hydrate properly on fluid restrictions.  No fever, chills, cough, rash, dizziness, SOB, chest pain or changes in bowel habits  No numbness or tingling in her extremities at this time.  No falls or syncope reported.   Wt Readings from Last 3 Encounters:  09/01/23 183 lb (83 kg)  08/04/23 183 lb (83 kg)  03/03/23 185 lb 1.3 oz (84 kg)    ECOG Performance Status: 1 - Symptomatic but completely ambulatory  Medications:  Allergies as of 09/01/2023       Reactions   Estrogens Other (See Comments)   Blood clots   Other Other (See Comments)   Unable to take birth control pills due to clotting disorder/fim   Prednisone Other (See Comments)   Facial edema - has had this 2020 and had no difficulty "can take in small doses"   Progesterone Other (See Comments)   Blood clots   Propofol Other (See Comments)   Became disinhibited and exacerbated restless leg syndrome during EGD/colonoscopy. Patient has requested an  alternate form of sedation during future procedures. Legs started thrashing and skin tears.   Lisinopril Cough   Clotrimazole Rash   Tape Rash, Other (See Comments)   Please use paper tape        Medication List        Accurate as of September 01, 2023 11:05 AM. If you have any questions, ask your nurse or doctor.          acetaminophen 500 MG tablet Commonly known as: TYLENOL Take 500 mg by mouth every 6 (six) hours as needed for moderate pain.   amiodarone 200 MG tablet Commonly known as: PACERONE Take 200 mg by mouth in the morning.   apixaban 5 MG Tabs tablet Commonly known as: Eliquis Take 1 tablet (5 mg total) by mouth 2 (two) times daily.   atorvastatin 20 MG tablet Commonly known as: LIPITOR Take 20 mg by mouth every evening.   budesonide-formoterol 160-4.5 MCG/ACT inhaler Commonly known as: Symbicort Inhale 2 puffs into the lungs in the morning and at bedtime.   cetirizine 10 MG tablet Commonly known as: ZYRTEC Take 10 mg by mouth in the morning.   Epogen 2000 UNIT/ML injection Generic drug: epoetin alfa 2,000 Units 3 (three) times a week.   gabapentin 100 MG capsule Commonly known as: NEURONTIN Take 1 capsule (100 mg total) by mouth 4 (four) times daily.   Incruse  Ellipta 62.5 MCG/ACT Aepb Generic drug: umeclidinium bromide Inhale 1 puff into the lungs daily.   lanthanum 500 MG chewable tablet Commonly known as: FOSRENOL Chew 1,000-2,000 mg by mouth 2 (two) times daily with a meal.  500 mg with each snack   Medical Compression Stockings Misc by Does not apply route. Leg wraps.   omeprazole 20 MG capsule Commonly known as: PRILOSEC Take 20 mg by mouth daily as needed (acid reflux). Rarely takes   ondansetron 4 MG disintegrating tablet Commonly known as: ZOFRAN-ODT Take 4 mg by mouth every Monday, Wednesday, and Friday.   oxyCODONE 5 MG immediate release tablet Commonly known as: Roxicodone Take one po qd formore severe pain What  changed:  how much to take how to take this when to take this reasons to take this additional instructions   PARSABIV IV Inject 1 Dose into the vein 3 (three) times a week. Receives at Dialysis   PRESCRIPTION MEDICATION Inhale into the lungs at bedtime. CPAP with nasal pillow   RENAL VITAMIN PO Take 1 tablet by mouth daily.   rOPINIRole 2 MG tablet Commonly known as: REQUIP Take 1 tablet (2 mg total) by mouth at bedtime.   rOPINIRole 0.5 MG tablet Commonly known as: REQUIP TAKE 1-2 TABLETS BY MOUTH IN THE MORNING.   SODIUM THIOSULFATE IV Inject 1 Dose into the vein 3 (three) times a week. Receives at Dialysis   sulfaSALAzine 500 MG tablet Commonly known as: AZULFIDINE Take 500 mg by mouth 4 (four) times daily.   traMADol 50 MG tablet Commonly known as: ULTRAM Take 1 tablet (50 mg total) by mouth 2 (two) times daily as needed (pain).   triamcinolone cream 0.1 % Commonly known as: KENALOG Apply 1 Application topically daily as needed (itching).   ZOFRAN PO Take 1 tablet by mouth as needed.        Allergies:  Allergies  Allergen Reactions   Estrogens Other (See Comments)    Blood clots   Other Other (See Comments)    Unable to take birth control pills due to clotting disorder/fim   Prednisone Other (See Comments)    Facial edema - has had this 2020 and had no difficulty "can take in small doses"   Progesterone Other (See Comments)    Blood clots   Propofol Other (See Comments)    Became disinhibited and exacerbated restless leg syndrome during EGD/colonoscopy. Patient has requested an alternate form of sedation during future procedures.  Legs started thrashing and skin tears.   Lisinopril Cough   Clotrimazole Rash   Tape Rash and Other (See Comments)    Please use paper tape    Past Medical History, Surgical history, Social history, and Family History were reviewed and updated.  Review of Systems: All other 10 point review of systems is negative.    Physical Exam:  height is 5\' 1"  (1.549 m) and weight is 183 lb (83 kg). Her oral temperature is 98.2 F (36.8 C). Her blood pressure is 116/65 and her pulse is 71. Her respiration is 16 and oxygen saturation is 100%.   Wt Readings from Last 3 Encounters:  09/01/23 183 lb (83 kg)  08/04/23 183 lb (83 kg)  03/03/23 185 lb 1.3 oz (84 kg)   Constitutional: Using a rolling walker for gentle assistance  Ocular: Sclerae unicteric, pupils equal, round and reactive to light Ear-nose-throat: Oropharynx clear, dentition fair Lymphatic: No cervical or supraclavicular adenopathy Lungs no rales or rhonchi, good excursion bilaterally Heart regular rate and  rhythm, no murmur appreciated Abd soft, nontender, positive bowel sounds MSK no focal spinal tenderness, no joint edema Neuro: non-focal, well-oriented, appropriate affect  Lab Results  Component Value Date   WBC 7.2 09/01/2023   HGB 9.4 (L) 09/01/2023   HCT 30.1 (L) 09/01/2023   MCV 91.2 09/01/2023   PLT 200 09/01/2023   Lab Results  Component Value Date   FERRITIN 1,002 (H) 03/03/2023   IRON 46 03/03/2023   TIBC 167 (L) 03/03/2023   UIBC 121 (L) 03/03/2023   IRONPCTSAT 28 03/03/2023   Lab Results  Component Value Date   RETICCTPCT 3.0 03/03/2023   RBC 3.30 (L) 09/01/2023   No results found for: "KPAFRELGTCHN", "LAMBDASER", "KAPLAMBRATIO" No results found for: "IGGSERUM", "IGA", "IGMSERUM" No results found for: "TOTALPROTELP", "ALBUMINELP", "A1GS", "A2GS", "BETS", "BETA2SER", "GAMS", "MSPIKE", "SPEI"   Chemistry      Component Value Date/Time   NA 141 03/03/2023 1041   NA 144 06/25/2017 1146   K 4.8 03/03/2023 1041   K 4.1 06/25/2017 1146   CL 93 (L) 03/03/2023 1041   CL 103 06/25/2017 1146   CO2 26 03/03/2023 1041   CO2 31 06/25/2017 1146   BUN 31 (H) 03/03/2023 1041   BUN 27 (H) 06/25/2017 1146   CREATININE 5.37 (H) 03/03/2023 1041   CREATININE 4.7 (HH) 06/25/2017 1146      Component Value Date/Time   CALCIUM 9.4  03/03/2023 1041   CALCIUM 9.7 06/25/2017 1146   ALKPHOS 92 03/03/2023 1041   ALKPHOS 60 06/25/2017 1146   AST 11 (L) 03/03/2023 1041   ALT 5 03/03/2023 1041   ALT 14 06/25/2017 1146   BILITOT 0.5 03/03/2023 1041     No diagnosis found.  Impression and Plan: Whitney Chavez is a very pleasant 67 yo caucasian female with history of recurrent thrombotic events. She has both Factor V Leiden as well as the prothrombin II gene mutation.   She is doing well on Eliquis  She will continue her same regimen and will continue her EPO RTC 6 months APP, labs (CBC, CMP, ferritin, iron)  Disposition RTC 6 months APP, labs ( CBC w/, CMP, LDH, ferritin, iron, retic)-[place]   Rushie Chestnut, PA-C 12/17/202411:05 AM

## 2023-09-03 ENCOUNTER — Ambulatory Visit (HOSPITAL_BASED_OUTPATIENT_CLINIC_OR_DEPARTMENT_OTHER): Payer: Medicare Other | Admitting: Pulmonary Disease

## 2023-09-03 ENCOUNTER — Encounter (HOSPITAL_BASED_OUTPATIENT_CLINIC_OR_DEPARTMENT_OTHER): Payer: Self-pay | Admitting: Pulmonary Disease

## 2023-09-03 ENCOUNTER — Telehealth (HOSPITAL_BASED_OUTPATIENT_CLINIC_OR_DEPARTMENT_OTHER): Payer: Self-pay | Admitting: Pulmonary Disease

## 2023-09-03 VITALS — BP 112/64 | HR 76 | Resp 16 | Ht 61.0 in | Wt 181.7 lb

## 2023-09-03 DIAGNOSIS — J4489 Other specified chronic obstructive pulmonary disease: Secondary | ICD-10-CM

## 2023-09-03 MED ORDER — SYMBICORT 160-4.5 MCG/ACT IN AERO: 2.0000 | INHALATION_SPRAY | Freq: Two times a day (BID) | RESPIRATORY_TRACT | 3 refills | Status: DC

## 2023-09-03 MED ORDER — INCRUSE ELLIPTA 62.5 MCG/ACT IN AEPB: 1.0000 | INHALATION_SPRAY | Freq: Every day | RESPIRATORY_TRACT | 3 refills | Status: DC

## 2023-09-03 NOTE — Progress Notes (Signed)
@Patient  ID: Whitney Chavez, female    DOB: 02/19/1956, 67 y.o.   MRN: [id number]  Chief Complaint  Patient presents with   Follow-up    Asthma-COPD- breathing has been very good. Had a spell in August and it took a while to get rid of cough but much better now.     Referring provider: Sigmund Hazel, MD  HPI: [name] is a 67 year old female former smoker (60-pack years), recurrent thromboembolic disease secondary to Factor V Leiden mutation and prothrombin II on anticoagulation, ESRD on HD, lymphedema, OSA and hx calciphylaxis who presents for follow-up for COPD.   09/02/22 Since her last visit she reports that she is on Symbicort and Incruse. She chronic cough with productive sputum whitish. Denies wheezing or shortness of breath. Overall compliant with inhaler. Has not needed emergency inhaler in years. She has had more fatigue with her dialysis sessions lately. Has more nausea later. Followed by [facility] with Dr. Dulce Chavez and work-up has been negative from their standpoint. Appetite poor and has been losing weight. Participating in physical therapy. Once graduated she plans to join [employer]. She wants to be evaluated for kidney transplant.  02/04/2023 Since her last visit she has completed PFTs. She is compliant with her Symbicort and Incruse which helps with sputum production.  09/03/23 She reports she is overall doing well. Denies shortness of breath, cough or wheezing. Compliant with Symbicort. Intermittently on Incruse. No exacerbations since our last visit.  Social: Recently spent time with her daughter who was given up for adoption  Past Medical History:  Diagnosis Date   Asthma    Axillary vein thrombosis (HCC) 08/16/2015   Calciphylaxis    Chronic kidney disease    Colitis    COPD (chronic obstructive pulmonary disease) (HCC)    Factor V Leiden (HCC)    Hypertension    Lymphedema    legs   MRSA (methicillin resistant staph aureus) culture positive     Neuropathy    Obstructive sleep apnea 03/29/2015   OSA on CPAP    Vision abnormalities     Outpatient Medications Prior to Visit  Medication Sig Dispense Refill   acetaminophen (TYLENOL) 500 MG tablet Take 500 mg by mouth every 6 (six) hours as needed for moderate pain.     amiodarone (PACERONE) 200 MG tablet Take 200 mg by mouth in the morning.     atorvastatin (LIPITOR) 20 MG tablet Take 20 mg by mouth every evening.     B Complex-C-Folic Acid (RENAL VITAMIN PO) Take 1 tablet by mouth daily.     cetirizine (ZYRTEC) 10 MG tablet Take 10 mg by mouth in the morning.     Elastic Bandages & Supports (MEDICAL COMPRESSION STOCKINGS) MISC by Does not apply route. Leg wraps.     epoetin alfa (EPOGEN) 2000 UNIT/ML injection 2,000 Units 3 (three) times a week.     Etelcalcetide HCl (PARSABIV IV) Inject 1 Dose into the vein 3 (three) times a week. Receives at Dialysis     gabapentin (NEURONTIN) 100 MG capsule Take 1 capsule (100 mg total) by mouth 4 (four) times daily. 360 capsule 3   lanthanum (FOSRENOL) 500 MG chewable tablet Chew 1,000-2,000 mg by mouth 2 (two) times daily with a meal.  500 mg with each snack     omeprazole (PRILOSEC) 20 MG capsule Take 20 mg by mouth daily as needed (acid reflux). Rarely takes     ondansetron (ZOFRAN-ODT) 4 MG disintegrating tablet  Take 4 mg by mouth every Monday, Wednesday, and Friday.     Ondansetron HCl (ZOFRAN PO) Take 1 tablet by mouth as needed.     oxyCODONE (ROXICODONE) 5 MG immediate release tablet Take one po qd formore severe pain (Patient taking differently: Take 5 mg by mouth as needed for moderate pain (pain score 4-6).) 30 tablet 0   PRESCRIPTION MEDICATION Inhale into the lungs at bedtime. CPAP with nasal pillow     rOPINIRole (REQUIP) 0.5 MG tablet TAKE 1-2 TABLETS BY MOUTH IN THE MORNING. 180 tablet 4   rOPINIRole (REQUIP) 2 MG tablet Take 1 tablet (2 mg total) by mouth at bedtime. 90 tablet 3   SODIUM THIOSULFATE IV Inject 1 Dose into the vein 3  (three) times a week. Receives at Dialysis     sulfaSALAzine (AZULFIDINE) 500 MG tablet Take 500 mg by mouth 4 (four) times daily.     traMADol (ULTRAM) 50 MG tablet Take 1 tablet (50 mg total) by mouth 2 (two) times daily as needed (pain). 60 tablet 0   triamcinolone cream (KENALOG) 0.1 % Apply 1 Application topically daily as needed (itching).     budesonide-formoterol (SYMBICORT) 160-4.5 MCG/ACT inhaler Inhale 2 puffs into the lungs in the morning and at bedtime. 30.6 g 3   umeclidinium bromide (INCRUSE ELLIPTA) 62.5 MCG/ACT AEPB Inhale 1 puff into the lungs daily. 90 each 1   apixaban (ELIQUIS) 5 MG TABS tablet Take 1 tablet (5 mg total) by mouth 2 (two) times daily. 60 tablet 0   No facility-administered medications prior to visit.    Review of Systems  Review of Systems  Constitutional:  Negative for chills, diaphoresis, fever, malaise/fatigue and weight loss.  HENT:  Negative for congestion.   Respiratory:  Negative for cough, hemoptysis, sputum production, shortness of breath and wheezing.   Cardiovascular:  Negative for chest pain, palpitations and leg swelling.    Blood pressure 112/64, pulse 76, resp. rate 16, height 5\' 1"  (1.549 m), weight 181 lb 11.2 oz (82.4 kg), SpO2 95%.  Physical Exam: General: Well-appearing, no acute distress HENT: [place], AT Eyes: EOMI, no scleral icterus Respiratory: Clear to auscultation bilaterally.  No crackles, wheezing or rales Cardiovascular: RRR, -M/R/G, no JVD Extremities:-Edema,-tenderness Neuro: AAO x4, CNII-XII grossly intact Psych: Normal mood, normal affect  Data Reviewed:  Imaging CXR 09/02/22 - Mild cardiomegaly. No acute issues  PFTs 07/28/2019 FVC 1.59 (55%), FEV1 1.24 (56%), ratio 78, normal DLCO Moderate obstruction with positive BD response. Curvature to flow volume loop.   12/29/17 PFTs with MCT- FVC 1.73 (59%), FEV1 1.30 (58%), ratio 75 Moderate obstruction with positive hyper-reactive airway/ FEV1 declined by more than  20%  02/03/2023 FVC 2.16 (85%) FEV1 1.76 (90%) Ratio 82  TLC 111% DLCO 86%.  No significant bronchodilator response Interpretation: No restrictive or obstructive defect present. Significantly improved on bronchodilators.   CBC    Component Value Date/Time   WBC 7.2 09/01/2023 1027   WBC 6.3 11/12/2022 1136   RBC 3.30 (L) 09/01/2023 1027   HGB 9.4 (L) 09/01/2023 1027   HGB 9.9 (L) 06/25/2017 1146   HCT 30.1 (L) 09/01/2023 1027   HCT 32.3 (L) 06/25/2017 1146   PLT 200 09/01/2023 1027   PLT 202 06/25/2017 1146   MCV 91.2 09/01/2023 1027   MCV 92 06/25/2017 1146   MCH 28.5 09/01/2023 1027   MCHC 31.2 09/01/2023 1027   RDW 17.2 (H) 09/01/2023 1027   RDW 16.4 (H) 06/25/2017 1146   LYMPHSABS 1.3 09/01/2023  1027   LYMPHSABS 1.2 06/25/2017 1146   MONOABS 0.7 09/01/2023 1027   EOSABS 0.2 09/01/2023 1027   EOSABS 0.2 06/25/2017 1146   BASOSABS 0.1 09/01/2023 1027   BASOSABS 0.0 06/25/2017 1146   Absolute eos  09/01/22 - 300 11/12/22 - 200    Assessment & Plan:   No problem-specific Assessment & Plan notes found for this encounter.  67 year old female former smoker with ESRD, recurrent thromboembolic disease 2/2 to factor V Leiden mutation and prothombin II on anticoagulation, lymphedema, OSA and hx calciphylaxis who presents for COPD follow-up. Symptoms fairly controlled on Symbicort alone and Incruse PRN  Moderately severe COPD (FEV1 58%) - normalized FEV1 on bronchodilators Asthma-COPD overlap --CONTINUE Symbicort 160-4.5 mcg TWO puffs TWICE a day. REFILLED --CONTINUE Incruse ONE puff a day. REFILLED --CONTINUE Albuterol TWO puffs as needed for shortness of breath or wheezing   Immunization History  Administered Date(s) Administered   Influenza-Unspecified 06/22/2015, 06/25/2016, 06/30/2019, 06/08/2023   Moderna Sars-Covid-2 Vaccination 12/01/2019, 12/29/2019   Pneumococcal Conjugate-13 11/27/2014   Pneumococcal Polysaccharide-23 07/11/2016   Zoster  Recombinant(Shingrix) 04/11/2017, 08/17/2017   CT Lung screen - not qualified due to >15 years after smoking  No orders of the defined types were placed in this encounter.  Meds ordered this encounter  Medications   SYMBICORT 160-4.5 MCG/ACT inhaler    Sig: Inhale 2 puffs into the lungs in the morning and at bedtime.    Dispense:  30.6 g    Refill:  3    Generic   umeclidinium bromide (INCRUSE ELLIPTA) 62.5 MCG/ACT AEPB    Sig: Inhale 1 puff into the lungs daily.    Dispense:  90 each    Refill:  3   Return in about 3 months (around 12/02/2023).  I have spent a total time of 33-minutes on the day of the appointment including chart review, data review, collecting history, coordinating care and discussing medical diagnosis and plan with the patient/family. Past medical history, allergies, medications were reviewed. Pertinent imaging, labs and tests included in this note have been reviewed and interpreted independently by me.  Mechele Collin, MD [facility] Pulmonary/Critical Care Medicine 09/03/2023 3:37 PM

## 2023-09-03 NOTE — Patient Instructions (Signed)
Moderately severe COPD (FEV1 58%) - normalized FEV1 on bronchodilators Asthma-COPD overlap --CONTINUE Symbicort 160-4.5 mcg TWO puffs TWICE a day. REFILLED --CONTINUE Incruse ONE puff a day. REFILLED --CONTINUE Albuterol TWO puffs as needed for shortness of breath or wheezing

## 2023-09-04 NOTE — Telephone Encounter (Signed)
Results have been relayed to the patient/authorized caretaker. The patient/authorized caretaker verbalized understanding. No questions at this time.   

## 2023-09-04 NOTE — Telephone Encounter (Signed)
Please contact patient regarding Symbicort:  Insurance is not covering brand name symbicort but will cover generic symbicort known as Architect. This has been ordered. Please let us know if you have any issues with this change.

## 2023-11-15 ENCOUNTER — Other Ambulatory Visit: Payer: Self-pay | Admitting: Neurology

## 2023-11-16 NOTE — Telephone Encounter (Signed)
 Last seen 08/04/23, next appt 02/23/24  Dispenses   Dispensed Days Supply Quantity Provider Pharmacy  TRAMADOL HCL 50 MG TABLET 10/18/2023 30 60 each Sater, Pearletha Furl, MD [facility] [phone] - J...  TRAMADOL HCL 50 MG TABLET 09/18/2023 30 60 each Sater, Pearletha Furl, MD [facility] [phone] - J...  TRAMADOL HCL 50 MG TABLET 07/12/2023 30 60 each Sater, Pearletha Furl, MD [facility] [phone] - J...  TRAMADOL HCL 50 MG TABLET 06/14/2023 30 60 each Sater, Pearletha Furl, MD [facility] [phone] - J...  TRAMADOL HCL 50 MG TABLET 05/13/2023 30 60 each Sater, Pearletha Furl, MD [facility] [phone] - J...  TRAMADOL HCL 50 MG TABLET 04/04/2023 30 60 each Sater, Pearletha Furl, MD [facility] [phone] - J...  TRAMADOL HCL 50 MG TABLET 03/01/2023 30 60 each Sater, Pearletha Furl, MD [facility] [phone] - J...  TRAMADOL HCL 50 MG TABLET 01/29/2023 30 60 each Sater, Pearletha Furl, MD [facility] [phone] - J...  TRAMADOL HCL 50 MG TABLET 12/28/2022 30 60 each Sater, Pearletha Furl, MD [facility] [phone] - J...  TRAMADOL HCL 50 MG TABLET 11/27/2022 30 60 each Sater, Pearletha Furl, MD [facility] [phone] - J.Marland KitchenMarland Kitchen

## 2023-11-18 ENCOUNTER — Encounter: Payer: Self-pay | Admitting: *Deleted

## 2023-12-13 ENCOUNTER — Encounter: Payer: Self-pay | Admitting: Neurology

## 2023-12-14 MED ORDER — OXYCODONE HCL 5 MG PO TABS: ORAL_TABLET | ORAL | 0 refills | Status: DC

## 2023-12-14 NOTE — Telephone Encounter (Signed)
 Last seen on 08/04/23 Follow up scheduled on 02/23/24  Per note on 10/30/22 "  Continue the gabapentin and ropinirole and tramadol for neuropathy and restless leg syndrome.  Prescriptions were sent in for refills.    She rarely takes an oxycodone when she wakes up with pain "  Last filled on 10/30/22 #30 tablets  Rx pending

## 2023-12-17 ENCOUNTER — Other Ambulatory Visit: Payer: Self-pay | Admitting: Neurology

## 2024-01-05 ENCOUNTER — Encounter (HOSPITAL_BASED_OUTPATIENT_CLINIC_OR_DEPARTMENT_OTHER): Payer: Self-pay | Admitting: Pulmonary Disease

## 2024-01-05 ENCOUNTER — Ambulatory Visit (INDEPENDENT_AMBULATORY_CARE_PROVIDER_SITE_OTHER): Admitting: Pulmonary Disease

## 2024-01-05 VITALS — BP 124/78 | HR 76 | Ht 61.0 in | Wt 179.2 lb

## 2024-01-05 DIAGNOSIS — J4489 Other specified chronic obstructive pulmonary disease: Secondary | ICD-10-CM | POA: Diagnosis not present

## 2024-01-05 NOTE — Progress Notes (Signed)
 @Patient  ID: Whitney Chavez, female    DOB: 11-22-1955, 68 y.o.   MRN: [id number]  Chief Complaint  Patient presents with   Follow-up    Asthma-COPD    Referring provider: Perley Bradley, MD  HPI: [name] is a 68 year old female former smoker (60-pack years), recurrent thromboembolic disease secondary to Factor V Leiden mutation and prothrombin II on anticoagulation, ESRD on HD, lymphedema, OSA and hx calciphylaxis who presents for follow-up for COPD.   09/02/22 Since her last visit she reports that she is on Symbicort  and Incruse. She chronic cough with productive sputum whitish. Denies wheezing or shortness of breath. Overall compliant with inhaler. Has not needed emergency inhaler in years. She has had more fatigue with her dialysis sessions lately. Has more nausea later. Followed by [facility] with Dr. Kimble Pennant and work-up has been negative from their standpoint. Appetite poor and has been losing weight. Participating in physical therapy. Once graduated she plans to join [employer]. She wants to be evaluated for kidney transplant.  02/04/2023 Since her last visit she has completed PFTs. She is compliant with her Symbicort  and Incruse which helps with sputum production.  09/03/23 She reports she is overall doing well. Denies shortness of breath, cough or wheezing. Compliant with Symbicort . Intermittently on Incruse. No exacerbations since our last visit.  01/05/24 Since our last visit she presents for follow-up for asthma-COPD. Has some worsening arthritic pain. Compliant with Breyna  and Incruse. Still has productive cough with cloudy sputum. Reports sinus drainage. Rarely uses albuterol . Takes zyrtec. No exacerbations since our last visit.   Social: Recently spent time with her daughter who was given up for adoption  Past Medical History:  Diagnosis Date   Asthma    Axillary vein thrombosis (HCC) 08/16/2015   Calciphylaxis    Chronic kidney disease    Colitis    COPD  (chronic obstructive pulmonary disease) (HCC)    Factor V Leiden (HCC)    Hypertension    Lymphedema    legs   MRSA (methicillin resistant staph aureus) culture positive    Neuropathy    Obstructive sleep apnea 03/29/2015   OSA on CPAP    Vision abnormalities     Outpatient Medications Prior to Visit  Medication Sig Dispense Refill   acetaminophen (TYLENOL) 500 MG tablet Take 500 mg by mouth every 6 (six) hours as needed for moderate pain.     amiodarone (PACERONE) 200 MG tablet Take 200 mg by mouth in the morning.     atorvastatin (LIPITOR) 20 MG tablet Take 20 mg by mouth every evening.     B Complex-C-Folic Acid (RENAL VITAMIN PO) Take 1 tablet by mouth daily.     BREYNA  160-4.5 MCG/ACT inhaler Inhale 2 puffs into the lungs 2 (two) times daily. 10.3 g 11   cetirizine (ZYRTEC) 10 MG tablet Take 10 mg by mouth in the morning.     Elastic Bandages & Supports (MEDICAL COMPRESSION STOCKINGS) MISC by Does not apply route. Leg wraps.     epoetin alfa (EPOGEN) 2000 UNIT/ML injection 2,000 Units 3 (three) times a week.     Etelcalcetide HCl (PARSABIV IV) Inject 1 Dose into the vein 3 (three) times a week. Receives at Dialysis     gabapentin  (NEURONTIN ) 100 MG capsule Take 1 capsule (100 mg total) by mouth 4 (four) times daily. 360 capsule 3   lanthanum  (FOSRENOL ) 500 MG chewable tablet Chew 1,000-2,000 mg by mouth 2 (two) times daily with a  meal.  500 mg with each snack     omeprazole (PRILOSEC) 20 MG capsule Take 20 mg by mouth daily as needed (acid reflux). Rarely takes     ondansetron (ZOFRAN-ODT) 4 MG disintegrating tablet Take 4 mg by mouth every Monday, Wednesday, and Friday.     Ondansetron HCl (ZOFRAN PO) Take 1 tablet by mouth as needed.     oxyCODONE  (ROXICODONE ) 5 MG immediate release tablet Take one po qd formore severe pain 30 tablet 0   PRESCRIPTION MEDICATION Inhale into the lungs at bedtime. CPAP with nasal pillow     rOPINIRole  (REQUIP ) 0.5 MG tablet TAKE 1-2 TABLETS BY MOUTH  IN THE MORNING. 180 tablet 4   rOPINIRole  (REQUIP ) 2 MG tablet Take 1 tablet (2 mg total) by mouth at bedtime. 90 tablet 3   SODIUM THIOSULFATE IV Inject 1 Dose into the vein 3 (three) times a week. Receives at Dialysis     sulfaSALAzine  (AZULFIDINE ) 500 MG tablet Take 500 mg by mouth 4 (four) times daily.     traMADol  (ULTRAM ) 50 MG tablet TAKE 1 TABLET (50 MG TOTAL) BY MOUTH 2 (TWO) TIMES DAILY AS NEEDED (PAIN). 60 tablet 5   triamcinolone cream (KENALOG) 0.1 % Apply 1 Application topically daily as needed (itching).     umeclidinium bromide  (INCRUSE ELLIPTA ) 62.5 MCG/ACT AEPB Inhale 1 puff into the lungs daily. 90 each 3   apixaban  (ELIQUIS ) 5 MG TABS tablet Take 1 tablet (5 mg total) by mouth 2 (two) times daily. 60 tablet 0   No facility-administered medications prior to visit.    Review of Systems  Review of Systems  Constitutional:  Negative for chills, diaphoresis, fever, malaise/fatigue and weight loss.  HENT:  Positive for congestion.   Respiratory:  Positive for cough and sputum production. Negative for hemoptysis, shortness of breath and wheezing.   Cardiovascular:  Negative for chest pain, palpitations and leg swelling.    Blood pressure 124/78, pulse 76, height 5\' 1"  (1.549 m), weight 179 lb 3.2 oz (81.3 kg), SpO2 93%.  Physical Exam: General: Well-appearing, no acute distress HENT: [place], AT Eyes: EOMI, no scleral icterus Respiratory: Clear to auscultation bilaterally.  No crackles, wheezing or rales Cardiovascular: RRR, -M/R/G, no JVD Extremities:-Edema,-tenderness Neuro: AAO x4, CNII-XII grossly intact Psych: Normal mood, normal affect   Data Reviewed:  Imaging CXR 09/02/22 - Mild cardiomegaly. No acute issues CXR 11/12/22 - Bibasilar atelectasis. No infiltrate effusion or edema  PFTs 07/28/2019 FVC 1.59 (55%), FEV1 1.24 (56%), ratio 78, normal DLCO Moderate obstruction with positive BD response. Curvature to flow volume loop.   12/29/17 PFTs with MCT- FVC  1.73 (59%), FEV1 1.30 (58%), ratio 75 Moderate obstruction with positive hyper-reactive airway/ FEV1 declined by more than 20%  02/03/2023 FVC 2.16 (85%) FEV1 1.76 (90%) Ratio 82  TLC 111% DLCO 86%.  No significant bronchodilator response Interpretation: No restrictive or obstructive defect present. Significantly improved on bronchodilators.   CBC    Component Value Date/Time   WBC 7.2 09/01/2023 1027   WBC 6.3 11/12/2022 1136   RBC 3.30 (L) 09/01/2023 1027   HGB 9.4 (L) 09/01/2023 1027   HGB 9.9 (L) 06/25/2017 1146   HCT 30.1 (L) 09/01/2023 1027   HCT 32.3 (L) 06/25/2017 1146   PLT 200 09/01/2023 1027   PLT 202 06/25/2017 1146   MCV 91.2 09/01/2023 1027   MCV 92 06/25/2017 1146   MCH 28.5 09/01/2023 1027   MCHC 31.2 09/01/2023 1027   RDW 17.2 (H) 09/01/2023 1027  RDW 16.4 (H) 06/25/2017 1146   LYMPHSABS 1.3 09/01/2023 1027   LYMPHSABS 1.2 06/25/2017 1146   MONOABS 0.7 09/01/2023 1027   EOSABS 0.2 09/01/2023 1027   EOSABS 0.2 06/25/2017 1146   BASOSABS 0.1 09/01/2023 1027   BASOSABS 0.0 06/25/2017 1146   Absolute eos  09/01/22 - 300 11/12/22 - 200    Assessment & Plan:   No problem-specific Assessment & Plan notes found for this encounter.  68 year old female former smoker with ESRD, recurrent thromboembolic disease 2/2 to factor V Leiden mutation and prothrombin II on anticoagulation, lymphedema, OSA and hx calciphylaxis who presents for COPD follow-up. Overall well-controlled on Breyna  and Incruse with occasional sputum production.   Moderately severe COPD (FEV1 58%) - normalized FEV1 on bronchodilators Asthma-COPD overlap --CONTINUE Breyna  160-4.5 mcg TWO puffs TWICE a day --CONTINUE Incruse ONE puff a day --CONTINUE Albuterol  TWO puffs as needed for shortness of breath or wheezing   Immunization History  Administered Date(s) Administered   Influenza-Unspecified 06/22/2015, 06/25/2016, 06/30/2019, 06/08/2023   Moderna Sars-Covid-2 Vaccination 12/01/2019,  12/29/2019   Pneumococcal Conjugate-13 11/27/2014   Pneumococcal Polysaccharide-23 07/11/2016   Zoster Recombinant(Shingrix) 04/11/2017, 08/17/2017   CT Lung screen - not qualified due to >15 years after smoking  No orders of the defined types were placed in this encounter.  No orders of the defined types were placed in this encounter.  Return in about 7 months (around 08/06/2024).  I have spent a total time of 32-minutes on the day of the appointment including chart review, data review, collecting history, coordinating care and discussing medical diagnosis and plan with the patient/family. Past medical history, allergies, medications were reviewed. Pertinent imaging, labs and tests included in this note have been reviewed and interpreted independently by me.  Genetta Kenning, MD [facility] Pulmonary/Critical Care Medicine 01/05/2024 9:55 AM

## 2024-02-12 ENCOUNTER — Inpatient Hospital Stay
Admission: RE | Admit: 2024-02-12 | Discharge: 2024-02-12 | Disposition: A | Discharge status: Home / Self Care | Payer: Self-pay | Source: Ambulatory Visit | Attending: Internal Medicine | Admitting: Internal Medicine

## 2024-02-12 ENCOUNTER — Other Ambulatory Visit: Payer: Self-pay

## 2024-02-12 DIAGNOSIS — I359 Nonrheumatic aortic valve disorder, unspecified: Secondary | ICD-10-CM

## 2024-02-22 NOTE — Progress Notes (Unsigned)
 Whitney Chavez

## 2024-02-23 ENCOUNTER — Encounter: Payer: Self-pay | Admitting: Neurology

## 2024-02-23 ENCOUNTER — Ambulatory Visit (INDEPENDENT_AMBULATORY_CARE_PROVIDER_SITE_OTHER): Payer: Medicare Other | Admitting: Neurology

## 2024-02-23 VITALS — BP 115/73 | HR 69 | Ht 60.0 in | Wt 179.5 lb

## 2024-02-23 DIAGNOSIS — N186 End stage renal disease: Secondary | ICD-10-CM | POA: Diagnosis not present

## 2024-02-23 DIAGNOSIS — G2581 Restless legs syndrome: Secondary | ICD-10-CM

## 2024-02-23 DIAGNOSIS — R269 Unspecified abnormalities of gait and mobility: Secondary | ICD-10-CM

## 2024-02-23 DIAGNOSIS — Z992 Dependence on renal dialysis: Secondary | ICD-10-CM

## 2024-02-23 DIAGNOSIS — M21372 Foot drop, left foot: Secondary | ICD-10-CM

## 2024-02-23 DIAGNOSIS — G4733 Obstructive sleep apnea (adult) (pediatric): Secondary | ICD-10-CM | POA: Diagnosis not present

## 2024-02-23 DIAGNOSIS — G629 Polyneuropathy, unspecified: Secondary | ICD-10-CM

## 2024-02-23 DIAGNOSIS — M21371 Foot drop, right foot: Secondary | ICD-10-CM

## 2024-02-23 NOTE — Progress Notes (Signed)
 [facility]  PATIENT: Whitney Chavez DOB: 07-Jun-1956  REFERRING DOCTOR OR PCP:  none SOURCE: patient and records from [facility]  _________________________________   HISTORICAL  CHIEF COMPLAINT:  Chief Complaint  Patient presents with   Follow-up    Pt in room 10. Alone. Here for Polyneuropathy/cpap follow up.  Patient reports neuropathy is bad, but she knows that dialysis flares pain. Pt said tremors in both hands left is worse,  pt report she has to have heart surgery.    HISTORY OF PRESENT ILLNESS:  Whitney Chavez is a 68 year old woman with Obstructive sleep apnea , insomnia, restless leg syndrome and polyneuropathy.      Update 10/30/2022 She has OSA and is using CPAP nightly and can' sleep well without it.   Download shows compliance was 93% and efficacy was good (AHI = 4.9).    She uses the [employer] mask (nasal pillows).   She has second machine x 5 years ([employer] 10).    She has more RLS symptoms during HD and at night.  Some nights are much worse than others.   She takes ropinirole  2 mg every night and 1/2 mg many afternoons and  before HD and also takes gabapentin  100 mg po qpm and 200 mg po qHS.   .  That and gabapentin  have helped.  Tramadol  50 mg po bid has helped.     She takes gabapentin  300 mg a day (100 in pm and 200 at bedtime) and ropinirole  0.5 mg in pm and 2 mg at bedtime)   She is on HD for ESRD and is trying to get on the transplant list (was able to lose weight).   She takes additional ropinirole  0.5 mg before HD.      Neuropathy pain has done better with the gabapentin  and tramadol .     She did PT and felt it helped.   Besides the neuropathy, she also has advanced knee DTR.    With PT  has strengthened some and is doig better with standing up (less time).   Silver sneakers was not covered so she bought a pedal exercise equipment.     She also has a frozen right shoulder that is bothersome at times.      She had calciphylaxis  due to HD (x 11+ years)  and required surgery .    This led to CRF/HD.    She has mild EDS.  EPWORTH SLEEPINESS SCALE  On a scale of 0 - 3 what is the chance of dozing:  Sitting and Reading:   3 Watching TV:    1 Sitting inactive in a public place: 1 Passenger in car for one hour: 1 Lying down to rest in the afternoon: 3 Sitting and talking to someone: 0 Sitting quietly after lunch:  0 In a car, stopped in traffic:  0  Total (out of 24):  9/24   borderline EDS (generally worse on HD days and better on non-HD days)      She will be needing AVR due to AR/AS.  This will be percutaneous.   REVIEW OF SYSTEMS: Constitutional: No fevers, chills, sweats, or change in appetite Eyes: No visual changes, double vision, eye pain Ear, nose and throat: No hearing loss, ear pain, nasal congestion, sore throat Cardiovascular: No chest pain, palpitations Respiratory:  No shortness of breath at rest or with exertion.   No wheezes GastrointestinaI: She has had vomiting, not always associated with dialysis though usually worse on those  days Genitourinary:  She is on hemodialysis. Musculoskeletal:  No neck pain, back pain Integumentary: No rash, pruritus, skin lesions Neurological: as above Psychiatric: No depression at this time.  No anxiety Endocrine: No palpitations, diaphoresis, change in appetite, change in weigh or increased thirst Hematologic/Lymphatic:  No anemia, purpura, petechiae. Allergic/Immunologic: No itchy/runny eyes, nasal congestion, recent allergic reactions, rashes  ALLERGIES: Allergies  Allergen Reactions   Estrogens Other (See Comments)    Blood clots   Other Other (See Comments)    Unable to take birth control pills due to clotting disorder/fim   Prednisone Other (See Comments)    Facial edema - has had this 2020 and had no difficulty "can take in small doses"   Progesterone Other (See Comments)    Blood clots   Propofol  Other (See Comments)    Became disinhibited  and exacerbated restless leg syndrome during EGD/colonoscopy. Patient has requested an alternate form of sedation during future procedures.  Legs started thrashing and skin tears.   Lisinopril Cough   Clotrimazole Rash   Tape Rash and Other (See Comments)    Please use paper tape    HOME MEDICATIONS:  Current Outpatient Medications:    acetaminophen (TYLENOL) 500 MG tablet, Take 500 mg by mouth every 6 (six) hours as needed for moderate pain., Disp: , Rfl:    amiodarone (PACERONE) 200 MG tablet, Take 200 mg by mouth in the morning., Disp: , Rfl:    atorvastatin (LIPITOR) 20 MG tablet, Take 20 mg by mouth every evening., Disp: , Rfl:    B Complex-C-Folic Acid (RENAL VITAMIN PO), Take 1 tablet by mouth daily., Disp: , Rfl:    BREYNA  160-4.5 MCG/ACT inhaler, Inhale 2 puffs into the lungs 2 (two) times daily., Disp: 10.3 g, Rfl: 11   cetirizine (ZYRTEC) 10 MG tablet, Take 10 mg by mouth in the morning., Disp: , Rfl:    Elastic Bandages & Supports (MEDICAL COMPRESSION STOCKINGS) MISC, by Does not apply route. Leg wraps., Disp: , Rfl:    epoetin alfa (EPOGEN) 2000 UNIT/ML injection, 2,000 Units 3 (three) times a week., Disp: , Rfl:    Etelcalcetide HCl (PARSABIV IV), Inject 1 Dose into the vein 3 (three) times a week. Receives at Dialysis, Disp: , Rfl:    gabapentin  (NEURONTIN ) 100 MG capsule, Take 1 capsule (100 mg total) by mouth 4 (four) times daily., Disp: 360 capsule, Rfl: 3   lanthanum  (FOSRENOL ) 500 MG chewable tablet, Chew 1,000-2,000 mg by mouth 2 (two) times daily with a meal.  500 mg with each snack, Disp: , Rfl:    omeprazole (PRILOSEC) 20 MG capsule, Take 20 mg by mouth daily as needed (acid reflux). Rarely takes, Disp: , Rfl:    ondansetron (ZOFRAN-ODT) 4 MG disintegrating tablet, Take 4 mg by mouth every Monday, Wednesday, and Friday., Disp: , Rfl:    Ondansetron HCl (ZOFRAN PO), Take 1 tablet by mouth as needed., Disp: , Rfl:    oxyCODONE  (ROXICODONE ) 5 MG immediate release  tablet, Take one po qd formore severe pain, Disp: 30 tablet, Rfl: 0   PRESCRIPTION MEDICATION, Inhale into the lungs at bedtime. CPAP with nasal pillow, Disp: , Rfl:    rOPINIRole  (REQUIP ) 0.5 MG tablet, TAKE 1-2 TABLETS BY MOUTH IN THE MORNING., Disp: 180 tablet, Rfl: 4   rOPINIRole  (REQUIP ) 2 MG tablet, Take 1 tablet (2 mg total) by mouth at bedtime., Disp: 90 tablet, Rfl: 3   SODIUM THIOSULFATE IV, Inject 1 Dose into the vein 3 (three) times a  week. Seymour Dapper at Dialysis, Disp: , Rfl:    sulfaSALAzine  (AZULFIDINE ) 500 MG tablet, Take 500 mg by mouth 4 (four) times daily., Disp: , Rfl:    traMADol  (ULTRAM ) 50 MG tablet, TAKE 1 TABLET (50 MG TOTAL) BY MOUTH 2 (TWO) TIMES DAILY AS NEEDED (PAIN)., Disp: 60 tablet, Rfl: 5   triamcinolone cream (KENALOG) 0.1 %, Apply 1 Application topically daily as needed (itching)., Disp: , Rfl:    umeclidinium bromide  (INCRUSE ELLIPTA ) 62.5 MCG/ACT AEPB, Inhale 1 puff into the lungs daily., Disp: 90 each, Rfl: 3   apixaban  (ELIQUIS ) 5 MG TABS tablet, Take 1 tablet (5 mg total) by mouth 2 (two) times daily., Disp: 60 tablet, Rfl: 0  PAST MEDICAL HISTORY: Past Medical History:  Diagnosis Date   Asthma    Axillary vein thrombosis (HCC) 08/16/2015   Calciphylaxis    Chronic kidney disease    Colitis    COPD (chronic obstructive pulmonary disease) (HCC)    Factor V Leiden (HCC)    Hypertension    Lymphedema    legs   MRSA (methicillin resistant staph aureus) culture positive    Neuropathy    Obstructive sleep apnea 03/29/2015   OSA on CPAP    Vision abnormalities     PAST SURGICAL HISTORY: Past Surgical History:  Procedure Laterality Date   APPLICATION OF WOUND VAC     AV FISTULA PLACEMENT Left    BIOPSY  11/05/2022   Procedure: BIOPSY;  Surgeon: Evangeline Hilts, MD;  Location: [facility];  Service: Gastroenterology;;   COLONOSCOPY WITH PROPOFOL  Bilateral 11/05/2022   Procedure: COLONOSCOPY WITH PROPOFOL ;  Surgeon: Evangeline Hilts, MD;  Location: [facility];  Service: Gastroenterology;  Laterality: Bilateral;   ESOPHAGOGASTRODUODENOSCOPY (EGD) WITH PROPOFOL  Bilateral 03/19/2022   Procedure: ESOPHAGOGASTRODUODENOSCOPY (EGD) WITH PROPOFOL ;  Surgeon: Evangeline Hilts, MD;  Location: [facility];  Service: Gastroenterology;  Laterality: Bilateral;   lumbar decompression fusion     TONSILLECTOMY      FAMILY HISTORY: Family History  Problem Relation Age of Onset   Diabetes Mother    Congestive Heart Failure Mother    COPD Mother    Stroke Mother    Neuropathy Mother    Heart disease Father    Stroke Father    Cancer Brother    Diabetes Brother    Diabetes Brother    Alcohol abuse Brother     SOCIAL HISTORY:  Social History   Socioeconomic History   Marital status: Single    Spouse name: Not on file   Number of children: Not on file   Years of education: Not on file   Highest education level: Not on file  Occupational History   Not on file  Tobacco Use   Smoking status: Former    Current packs/day: 0.00    Average packs/day: 2.0 packs/day for 30.0 years (60.0 ttl pk-yrs)    Types: Cigarettes    Start date: 11/24/1968    Quit date: 11/25/1998    Years since quitting: 25.2   Smokeless tobacco: Never  Vaping Use   Vaping status: Never Used  Substance and Sexual Activity   Alcohol use: No    Alcohol/week: 0.0 standard drinks of alcohol   Drug use: No   Sexual activity: Not on file  Other Topics Concern   Not on file  Social History Narrative   Not on file   Social Drivers of Health   Financial Resource Strain: Not on file  Food Insecurity: No Food Insecurity (06/07/2020)   Received  from [facility] visits prior to 11/15/2022., [facility] visits prior to 11/15/2022.   Hunger Vital Sign    Worried About Running Out of Food in the Last Year: Never true    Ran Out of Food in the Last Year: Never true  Transportation Needs: Not on file  Physical Activity: Not on file   Stress: Not on file  Social Connections: Unknown (01/13/2022)   Received from [facility]   Social Network    Social Network: Not on file  Intimate Partner Violence: Unknown (12/20/2021)   Received from [facility], [facility]   HITS    Physically Hurt: Not on file    Insult or Talk Down To: Not on file    Threaten Physical Harm: Not on file    Scream or Curse: Not on file     PHYSICAL EXAM  Vitals:   02/23/24 1047  BP: 115/73  Pulse: 69  SpO2: 97%  Weight: 179 lb 8 oz (81.4 kg)  Height: 5' (1.524 m)    Body mass index is 35.06 kg/m.   General: The patient is well-developed and well-nourished and in no acute distress.   She has Lymphedema and legs are wrapped.  We removed left wrapping for exam.      Neurologic Exam  Mental status: The patient is alert and oriented x 3 at the time of the examination. The patient has apparent normal recent and remote memory, with an apparently normal attention span and concentration ability.   Speech is normal.  Cranial nerves: Extraocular muscles are intact. Facial strength and sensation is normal.  Hearing is symmetric.    Motor:  Mild essential tremor in hands.  Muscle bulk is normal.   Tone is normal. Strength is normal in the arms. Strength is 4+/5 in the foot and ankle extensors  Sensory: She has very reduced sensation to vibration at the ankles and absent at toes  There is reduced sensation to touch from the ankles down.  Coordination: Cerebellar testing reveals good finger-nose-finger bilaterally.  Heel-to-shin is poor.  Gait and station: Station is normal.  The gait is wide with a reduced stride when she does not use a walker..  With a walker she has a fairly good-sized stride and good speed.  She is unable to do tandem walk.  Romberg is mildly positive.  Reflexes: Deep tendon reflexes are 1+ in arms and absent in knees and ankles.         ASSESSMENT AND PLAN    1. Polyneuropathy   2. Restless leg    3. OSA on CPAP   4. End-stage renal disease on hemodialysis (HCC)       1.  For restless leg syndrome, she will continue the current dose of gabapentin  and ropinirole .  We discussed that these medications are renally cleared so I am unlikely to increase the dose further.  She also takes tramadol  for the neuropathic pain.  If she has severe restless leg syndrome she will take an oxycodone  (just once or twice a week)  2.   She will continue CPAP +10cm .  She has benefited from CPAP for the treatment of her severe OSA (AHI = 44.7 in February 2015 PSG).     3.   I gave her a prescription for bilateral AFO splints.  She will get these at either [facility] or [facility].  Phone numbers were provided.  Try to exercise regularly.    4.  return in 6 months or sooner if there are new or worsening neurologic symptoms.  This visit is part of a comprehensive longitudinal care medical relationship regarding the patients primary diagnosis of polyneuropathy, gait disturnce, RLS and OSA and related concerns.   Monia Timmers A. Godwin Lat, MD, PhD, Terral Ferrari  02/23/2024, 11:14 AM Certified in Neurology, Clinical Neurophysiology, Sleep Medicine, Pain Medicine and Neuroimaging  [facility] [address], [address] [place], [place] [address] [phone]

## 2024-03-01 ENCOUNTER — Inpatient Hospital Stay (HOSPITAL_BASED_OUTPATIENT_CLINIC_OR_DEPARTMENT_OTHER): Payer: Medicare Other | Admitting: Medical Oncology

## 2024-03-01 ENCOUNTER — Inpatient Hospital Stay: Payer: Medicare Other | Attending: Medical Oncology

## 2024-03-01 ENCOUNTER — Encounter: Payer: Self-pay | Admitting: Medical Oncology

## 2024-03-01 VITALS — BP 110/59 | HR 70 | Temp 98.6°F | Resp 19 | Ht 61.0 in | Wt 178.1 lb

## 2024-03-01 DIAGNOSIS — Z7901 Long term (current) use of anticoagulants: Secondary | ICD-10-CM | POA: Insufficient documentation

## 2024-03-01 DIAGNOSIS — D631 Anemia in chronic kidney disease: Secondary | ICD-10-CM | POA: Insufficient documentation

## 2024-03-01 DIAGNOSIS — Z992 Dependence on renal dialysis: Secondary | ICD-10-CM

## 2024-03-01 DIAGNOSIS — D649 Anemia, unspecified: Secondary | ICD-10-CM

## 2024-03-01 DIAGNOSIS — N186 End stage renal disease: Secondary | ICD-10-CM

## 2024-03-01 DIAGNOSIS — D6851 Activated protein C resistance: Secondary | ICD-10-CM | POA: Diagnosis present

## 2024-03-01 DIAGNOSIS — D509 Iron deficiency anemia, unspecified: Secondary | ICD-10-CM

## 2024-03-01 LAB — CMP (CANCER CENTER ONLY)
ALT: 11 U/L (ref 0–44)
AST: 12 U/L — ABNORMAL LOW (ref 15–41)
Albumin: 4.1 g/dL (ref 3.5–5.0)
Alkaline Phosphatase: 70 U/L (ref 38–126)
Anion gap: 18 — ABNORMAL HIGH (ref 5–15)
BUN: 40 mg/dL — ABNORMAL HIGH (ref 8–23)
CO2: 26 mmol/L (ref 22–32)
Calcium: 8.8 mg/dL — ABNORMAL LOW (ref 8.9–10.3)
Chloride: 96 mmol/L — ABNORMAL LOW (ref 98–111)
Creatinine: 5.82 mg/dL — ABNORMAL HIGH (ref 0.44–1.00)
GFR, Estimated: 7 mL/min — ABNORMAL LOW (ref >60)
Glucose, Bld: 112 mg/dL — ABNORMAL HIGH (ref 70–99)
Potassium: 5.9 mmol/L — ABNORMAL HIGH (ref 3.5–5.1)
Sodium: 140 mmol/L (ref 135–145)
Total Bilirubin: 0.6 mg/dL (ref 0.0–1.2)
Total Protein: 6.8 g/dL (ref 6.5–8.1)

## 2024-03-01 LAB — CBC
HCT: 36.4 % (ref 36.0–46.0)
Hemoglobin: 11.3 g/dL — ABNORMAL LOW (ref 12.0–15.0)
MCH: 27.8 pg (ref 26.0–34.0)
MCHC: 31 g/dL (ref 30.0–36.0)
MCV: 89.4 fL (ref 80.0–100.0)
Platelets: 151 10*3/uL (ref 150–400)
RBC: 4.07 MIL/uL (ref 3.87–5.11)
RDW: 17.1 % — ABNORMAL HIGH (ref 11.5–15.5)
WBC: 6.4 10*3/uL (ref 4.0–10.5)
nRBC: 0 % (ref 0.0–0.2)

## 2024-03-01 LAB — IRON AND IRON BINDING CAPACITY (CC-WL,HP ONLY)
Iron: 54 ug/dL (ref 28–170)
Saturation Ratios: 35 % — ABNORMAL HIGH (ref 10.4–31.8)
TIBC: 153 ug/dL — ABNORMAL LOW (ref 250–450)
UIBC: 99 ug/dL — ABNORMAL LOW (ref 148–442)

## 2024-03-01 LAB — FERRITIN: Ferritin: 1235 ng/mL — ABNORMAL HIGH (ref 11–307)

## 2024-03-01 NOTE — Progress Notes (Signed)
 Hematology and Oncology Follow Up Visit  Whitney Chavez [id number] 1955/12/08 68 y.o. 03/02/2024   Principle Diagnosis:  Recurrent thromboembolic disease Heterozygous factor V Leiden mutation Prothrombin II gene mutation Calciphylaxis Anemia secondary to ESRD    Current Therapy:        Eliquis  5 mg PO BID Dialysis  EPO- administered by her nephrologist    Interim History:  Whitney Chavez is here today for follow-up.  She states that she has been doing well and has no complaints at this time. She is planning on having aortic value replacement surgery within the next few months. She next sees her cardiothoracic surgeon on 03/10/2024  She gets EPO at her nephrology office who also manages her dialysis.   No blood loss noted. No abnormal bruising, no petechiae. Appetite comes and goes. She has occasional episodes of n/v and states that her work up with GI so far has negative.  She does her best to hydrate properly on fluid restrictions.  No fever, chills, cough, rash, dizziness, SOB, chest pain or changes in bowel habits  No numbness or tingling in her extremities at this time.  No falls or syncope reported.   Wt Readings from Last 3 Encounters:  03/01/24 178 lb 1.9 oz (80.8 kg)  02/23/24 179 lb 8 oz (81.4 kg)  01/05/24 179 lb 3.2 oz (81.3 kg)    ECOG Performance Status: 1 - Symptomatic but completely ambulatory  Medications:  Allergies as of 03/01/2024       Reactions   Estrogens Other (See Comments)   Blood clots   Other Other (See Comments)   Unable to take birth control pills due to clotting disorder/fim   Prednisone Other (See Comments)   Facial edema - has had this 2020 and had no difficulty can take in small doses   Progesterone Other (See Comments)   Blood clots   Propofol  Other (See Comments)   Became disinhibited and exacerbated restless leg syndrome during EGD/colonoscopy. Patient has requested an alternate form of sedation during future procedures. Legs  started thrashing and skin tears.   Lisinopril Cough   Clotrimazole Rash   Tape Rash, Other (See Comments)   Please use paper tape        Medication List        Accurate as of March 01, 2024 11:59 PM. If you have any questions, ask your nurse or doctor.          acetaminophen 500 MG tablet Commonly known as: TYLENOL Take 500 mg by mouth every 6 (six) hours as needed for moderate pain.   amiodarone 200 MG tablet Commonly known as: PACERONE Take 200 mg by mouth in the morning.   apixaban  5 MG Tabs tablet Commonly known as: Eliquis  Take 1 tablet (5 mg total) by mouth 2 (two) times daily.   atorvastatin 20 MG tablet Commonly known as: LIPITOR Take 20 mg by mouth every evening.   Breyna  160-4.5 MCG/ACT inhaler Generic drug: budesonide -formoterol  Inhale 2 puffs into the lungs 2 (two) times daily.   cetirizine 10 MG tablet Commonly known as: ZYRTEC Take 10 mg by mouth in the morning.   Epogen 2000 UNIT/ML injection Generic drug: epoetin alfa 2,000 Units 3 (three) times a week.   gabapentin  100 MG capsule Commonly known as: NEURONTIN  Take 1 capsule (100 mg total) by mouth 4 (four) times daily.   Incruse Ellipta  62.5 MCG/ACT Aepb Generic drug: umeclidinium bromide  Inhale 1 puff into the lungs daily.   lanthanum  500 MG chewable  tablet Commonly known as: FOSRENOL  Chew 1,000-2,000 mg by mouth 2 (two) times daily with a meal.  500 mg with each snack   Medical Compression Stockings Misc by Does not apply route. Leg wraps.   omeprazole 20 MG capsule Commonly known as: PRILOSEC Take 20 mg by mouth daily as needed (acid reflux). Rarely takes   ondansetron 4 MG disintegrating tablet Commonly known as: ZOFRAN-ODT Take 4 mg by mouth every Monday, Wednesday, and Friday.   oxyCODONE  5 MG immediate release tablet Commonly known as: Roxicodone  Take one po qd formore severe pain   PARSABIV IV Inject 1 Dose into the vein 3 (three) times a week. Receives at Dialysis    PRESCRIPTION MEDICATION Inhale into the lungs at bedtime. CPAP with nasal pillow   RENAL VITAMIN PO Take 1 tablet by mouth daily.   rOPINIRole  2 MG tablet Commonly known as: REQUIP  Take 1 tablet (2 mg total) by mouth at bedtime.   rOPINIRole  0.5 MG tablet Commonly known as: REQUIP  TAKE 1-2 TABLETS BY MOUTH IN THE MORNING.   SODIUM THIOSULFATE IV Inject 1 Dose into the vein 3 (three) times a week. Receives at Dialysis   sulfaSALAzine  500 MG tablet Commonly known as: AZULFIDINE  Take 500 mg by mouth 4 (four) times daily.   traMADol  50 MG tablet Commonly known as: ULTRAM  TAKE 1 TABLET (50 MG TOTAL) BY MOUTH 2 (TWO) TIMES DAILY AS NEEDED (PAIN).   triamcinolone cream 0.1 % Commonly known as: KENALOG Apply 1 Application topically daily as needed (itching).   ZOFRAN PO Take 1 tablet by mouth as needed.        Allergies:  Allergies  Allergen Reactions   Estrogens Other (See Comments)    Blood clots   Other Other (See Comments)    Unable to take birth control pills due to clotting disorder/fim   Prednisone Other (See Comments)    Facial edema - has had this 2020 and had no difficulty can take in small doses   Progesterone Other (See Comments)    Blood clots   Propofol  Other (See Comments)    Became disinhibited and exacerbated restless leg syndrome during EGD/colonoscopy. Patient has requested an alternate form of sedation during future procedures.  Legs started thrashing and skin tears.   Lisinopril Cough   Clotrimazole Rash   Tape Rash and Other (See Comments)    Please use paper tape    Past Medical History, Surgical history, Social history, and Family History were reviewed and updated.  Review of Systems: All other 10 point review of systems is negative.   Physical Exam:  height is 5' 1 (1.549 m) and weight is 178 lb 1.9 oz (80.8 kg). Her oral temperature is 98.6 F (37 C). Her blood pressure is 110/59 (abnormal) and her pulse is 70. Her respiration is  19 and oxygen saturation is 100%.   Wt Readings from Last 3 Encounters:  03/01/24 178 lb 1.9 oz (80.8 kg)  02/23/24 179 lb 8 oz (81.4 kg)  01/05/24 179 lb 3.2 oz (81.3 kg)   Constitutional: Using a rolling walker for gentle assistance  Ocular: Sclerae unicteric, pupils equal, round and reactive to light Ear-nose-throat: Oropharynx clear, dentition fair Lymphatic: No cervical or supraclavicular adenopathy Lungs no rales or rhonchi, good excursion bilaterally Heart regular rate and rhythm, no murmur appreciated Abd soft, nontender, positive bowel sounds MSK no focal spinal tenderness, no joint edema Neuro: non-focal, well-oriented, appropriate affect  Lab Results  Component Value Date   WBC 6.4 03/01/2024  HGB 11.3 (L) 03/01/2024   HCT 36.4 03/01/2024   MCV 89.4 03/01/2024   PLT 151 03/01/2024   Lab Results  Component Value Date   FERRITIN 1,235 (H) 03/01/2024   IRON 54 03/01/2024   TIBC 153 (L) 03/01/2024   UIBC 99 (L) 03/01/2024   IRONPCTSAT 35 (H) 03/01/2024   Lab Results  Component Value Date   RETICCTPCT 3.0 03/03/2023   RBC 4.07 03/01/2024   No results found for: KPAFRELGTCHN, LAMBDASER, KAPLAMBRATIO No results found for: IGGSERUM, IGA, IGMSERUM No results found for: Hobson Luna, SPEI   Chemistry      Component Value Date/Time   NA 140 03/01/2024 1047   NA 144 06/25/2017 1146   K 5.9 (H) 03/01/2024 1047   K 4.1 06/25/2017 1146   CL 96 (L) 03/01/2024 1047   CL 103 06/25/2017 1146   CO2 26 03/01/2024 1047   CO2 31 06/25/2017 1146   BUN 40 (H) 03/01/2024 1047   BUN 27 (H) 06/25/2017 1146   CREATININE 5.82 (H) 03/01/2024 1047   CREATININE 4.7 (HH) 06/25/2017 1146      Component Value Date/Time   CALCIUM 8.8 (L) 03/01/2024 1047   CALCIUM 9.7 06/25/2017 1146   ALKPHOS 70 03/01/2024 1047   ALKPHOS 60 06/25/2017 1146   AST 12 (L) 03/01/2024 1047   ALT 11 03/01/2024 1047    ALT 14 06/25/2017 1146   BILITOT 0.6 03/01/2024 1047     Encounter Diagnoses  Name Primary?   Anemia, unspecified type Yes   Factor 5 Leiden mutation, heterozygous (HCC)    Long term current use of anticoagulant    ESRD (end stage renal disease) on dialysis [facility])     Impression and Plan: Whitney Chavez is a very pleasant 68 yo caucasian female with history of recurrent thrombotic events. She has both Factor V Leiden as well as the prothrombin II gene mutation. She has a history of anemia  She is doing well on Eliquis  without bleeding episodes or side effects. We will work with her surgeon for perioperative clearance. She is considered high risk for VTE. Procedure is considered high risk for bleeding is not completed via TAVR. If TAVR this would be considered low to moderate risk. Patient is electing to have her value replaced via TAVR procedure. In this case we would ask that she hold her Eliquis  2 days prior to her surgery and restart it 24 hours after her surgery. Given her dialysis regimen, she typically has held her Eliquis  5 days prior to surgery and then transition to heparin  as a bridge. She has done well with this in the past and would prefer to do this method instead. She is comfortable administering and has needles/syringes at home. She will Inject 1 mL (5,000 Units total) into the skin 2 (two) times daily. To be given subcutaneously for 3 days prior to surgery. She will then restart her Eliquis  24 hours after surgery or as advised by her cardiothoracic surgeon. Reviewed with Dr. Maria Shiner  She will continue her same regimen and will continue her EPO RTC 6 months APP, labs (CBC, CMP)  Disposition RTC 6 months APP, labs ( CBC w/, CMP)-[place]   Sharla Davis, PA-C 6/18/20253:08 PM

## 2024-03-02 MED ORDER — HEPARIN SODIUM (PORCINE) 5000 UNIT/ML IJ SOLN: INTRAMUSCULAR | 6 refills | Status: DC

## 2024-03-03 ENCOUNTER — Ambulatory Visit: Payer: Self-pay | Admitting: Medical Oncology

## 2024-03-05 NOTE — H&P (View-Only) (Signed)
 Patient ID: Whitney Chavez MRN: [id number] DOB/AGE: Sep 12, 1956 68 y.o.  Primary Care Physician:Miller, Olam, MD Primary Cardiologist: Wendel (new)  CC:  Aortic valvular disease management     FOCUSED PROBLEM LIST:   Aortic stenosis AVA 0.44, MG 40, EF 60 TO 65% OSH TTE April 2025 EKG sinus rhythm without conduction issues 2024 End-stage renal disease Recurrent thromboembolic disease Factor V Leyden heterozygous + Prothrombin II mutation On indefinite Eliquis  Bridging with SQ heparin  for procedures PAF On Eliquis  Anemia Secondary to ESRD On EPO Hyperlipidemia Aortic atherosclerosis CT abdomen pelvis 2019 OSA On CPAP BMI 33/BSA 1.86  June 2025:  Patient consents to use of AI scribe. The patient is a 68 year old female with the above listed medical is referred for recommendations regarding her aortic valvular disease.  The patient had been seen at [facility] and was being evaluated for TAVR procedure.  She is here to discuss further.  She has been experiencing increasing fatigue over the past few months, which she attributes to her dialysis sessions and knee problems. She denies significant shortness of breath or chest pain but notes increased tiredness and occasional dizziness, with one episode of visual disturbance about three to four weeks ago.  She has been on dialysis for 14 years, with a well-functioning fistula. She experiences episodes of atrial fibrillation, typically when excessive fluid is removed during dialysis, leading to her feeling 'too dry.' She is on Eliquis  5 mg twice daily for factor V Leiden and prothrombin gene mutation, and she undergoes bridge therapy with heparin  before surgeries. She has a history of calciphylaxis and underwent a punch biopsy, followed by further surgery for a non-healing wound.  Her past medical history includes severe aortic stenosis, for which she is being evaluated. She uses a CPAP machine for severe sleep apnea and has a history of  back surgery in 2007 for L4-L5 decompression. She also reports restless leg syndrome, for which she occasionally takes oxycodone  5 mg.  She lives alone and manages her daily activities with some assistance from neighbors and a friend she met at dialysis. Her family resides in [place]  and [place] . She has a history of significant weight loss, attributed to her dialysis treatment, and she wears leg wraps to manage lymphedema, which have been effective in reducing fluid retention in her legs.  She has a history of bleeding easily, with prolonged bleeding from minor cuts and bruises, attributed to her anticoagulation therapy. No recent episodes of chest pain, shortness of breath, or significant bleeding events such as epistaxis or melena.  She sees a Education officer, community on a regular basis and reports good dental health.  She does need some dental work but nothing that is symptomatic is bothering her.        Past Medical History:  Diagnosis Date   Asthma    Axillary vein thrombosis (HCC) 08/16/2015   Calciphylaxis    Chronic kidney disease    Colitis    COPD (chronic obstructive pulmonary disease) (HCC)    Factor V Leiden (HCC)    Hypertension    Lymphedema    legs   MRSA (methicillin resistant staph aureus) culture positive    Neuropathy    Obstructive sleep apnea 03/29/2015   OSA on CPAP    Vision abnormalities     Past Surgical History:  Procedure Laterality Date   APPLICATION OF WOUND VAC     AV FISTULA PLACEMENT Left    BIOPSY  11/05/2022   Procedure: BIOPSY;  Surgeon: Burnette Fallow, MD;  Location:  [facility];  Service: Gastroenterology;;   COLONOSCOPY WITH PROPOFOL  Bilateral 11/05/2022   Procedure: COLONOSCOPY WITH PROPOFOL ;  Surgeon: Burnette Fallow, MD;  Location: [facility];  Service: Gastroenterology;  Laterality: Bilateral;   ESOPHAGOGASTRODUODENOSCOPY (EGD) WITH PROPOFOL  Bilateral 03/19/2022   Procedure: ESOPHAGOGASTRODUODENOSCOPY (EGD) WITH PROPOFOL ;  Surgeon: Burnette Fallow, MD;   Location: [facility];  Service: Gastroenterology;  Laterality: Bilateral;   lumbar decompression fusion     TONSILLECTOMY      Family History  Problem Relation Age of Onset   Diabetes Mother    Congestive Heart Failure Mother    COPD Mother    Stroke Mother    Neuropathy Mother    Heart disease Father    Stroke Father    Cancer Brother    Diabetes Brother    Diabetes Brother    Alcohol abuse Brother     Social History   Socioeconomic History   Marital status: Single    Spouse name: Not on file   Number of children: Not on file   Years of education: Not on file   Highest education level: Not on file  Occupational History   Not on file  Tobacco Use   Smoking status: Former    Current packs/day: 0.00    Average packs/day: 2.0 packs/day for 30.0 years (60.0 ttl pk-yrs)    Types: Cigarettes    Start date: 11/24/1968    Quit date: 11/25/1998    Years since quitting: 25.3   Smokeless tobacco: Never  Vaping Use   Vaping status: Never Used  Substance and Sexual Activity   Alcohol use: No    Alcohol/week: 0.0 standard drinks of alcohol   Drug use: No   Sexual activity: Not on file  Other Topics Concern   Not on file  Social History Narrative   Not on file   Social Drivers of Health   Financial Resource Strain: Not on file  Food Insecurity: No Food Insecurity (06/07/2020)   Received from [facility] visits prior to 11/15/2022.   Hunger Vital Sign    Within the past 12 months, you worried that your food would run out before you got the money to buy more.: Never true    Within the past 12 months, the food you bought just didn't last and you didn't have money to get more.: Never true  Transportation Needs: Not on file  Physical Activity: Not on file  Stress: Not on file  Social Connections: Unknown (01/13/2022)   [facility] from [facility]   Social Network    Social Network: Not on file  Intimate Partner Violence: Unknown (12/20/2021)   Received from  [facility]   HITS    Physically Hurt: Not on file    Insult or Talk Down To: Not on file    Threaten Physical Harm: Not on file    Scream or Curse: Not on file     Prior to Admission medications   Medication Sig Start Date End Date Taking? Authorizing Provider  acetaminophen (TYLENOL) 500 MG tablet Take 500 mg by mouth every 6 (six) hours as needed for moderate pain.    [provider]  amiodarone (PACERONE) 200 MG tablet Take 200 mg by mouth in the morning. 12/28/18   [provider]  apixaban  (ELIQUIS ) 5 MG TABS tablet Take 1 tablet (5 mg total) by mouth 2 (two) times daily. 10/13/22 03/01/24  Timmy Maude SAUNDERS, MD  atorvastatin (LIPITOR) 20 MG tablet Take 20 mg by mouth every  evening. 01/03/19   [provider]  B Complex-C-Folic Acid (RENAL VITAMIN PO) Take 1 tablet by mouth daily.    [provider]  BREYNA  160-4.5 MCG/ACT inhaler Inhale 2 puffs into the lungs 2 (two) times daily. 09/04/23   Kassie Acquanetta Bradley, MD  cetirizine (ZYRTEC) 10 MG tablet Take 10 mg by mouth in the morning.    [provider]  Elastic Bandages & Supports (MEDICAL COMPRESSION STOCKINGS) MISC by Does not apply route. Leg wraps.    [provider]  epoetin alfa (EPOGEN) 2000 UNIT/ML injection 2,000 Units 3 (three) times a week.    [provider]  Etelcalcetide HCl (PARSABIV IV) Inject 1 Dose into the vein 3 (three) times a week. Receives at Dialysis    [provider]  gabapentin  (NEURONTIN ) 100 MG capsule Take 1 capsule (100 mg total) by mouth 4 (four) times daily. 08/04/23   Lomax, Amy, NP  heparin  5000 UNIT/ML injection Inject 1 mL (5,000 Units total) into the skin 2 (two) times daily. To be given subcutaneously for 3 days prior to surgery 03/02/24   Tonette Lauraine HERO, PA-C  lanthanum  (FOSRENOL ) 500 MG chewable tablet Chew 1,000-2,000 mg by mouth 2 (two) times daily with a meal.  500 mg with each snack 12/04/16   [provider]   omeprazole (PRILOSEC) 20 MG capsule Take 20 mg by mouth daily as needed (acid reflux). Rarely takes    [provider]  ondansetron (ZOFRAN-ODT) 4 MG disintegrating tablet Take 4 mg by mouth every Monday, Wednesday, and Friday.    [provider]  Ondansetron HCl (ZOFRAN PO) Take 1 tablet by mouth as needed.    [provider]  oxyCODONE  (ROXICODONE ) 5 MG immediate release tablet Take one po qd formore severe pain 12/14/23   Sater, Charlie LABOR, MD  PRESCRIPTION MEDICATION Inhale into the lungs at bedtime. CPAP with nasal pillow    [provider]  rOPINIRole  (REQUIP ) 0.5 MG tablet TAKE 1-2 TABLETS BY MOUTH IN THE MORNING. 08/04/23   Lomax, Amy, NP  rOPINIRole  (REQUIP ) 2 MG tablet Take 1 tablet (2 mg total) by mouth at bedtime. 08/04/23   Lomax, Amy, NP  SODIUM THIOSULFATE IV Inject 1 Dose into the vein 3 (three) times a week. Receives at Dialysis    [provider]  sulfaSALAzine  (AZULFIDINE ) 500 MG tablet Take 500 mg by mouth 4 (four) times daily. 02/15/15   [provider]  traMADol  (ULTRAM ) 50 MG tablet TAKE 1 TABLET (50 MG TOTAL) BY MOUTH 2 (TWO) TIMES DAILY AS NEEDED (PAIN). 12/17/23   Sater, Charlie LABOR, MD  triamcinolone cream (KENALOG) 0.1 % Apply 1 Application topically daily as needed (itching). 08/31/18   [provider]  umeclidinium bromide  (INCRUSE ELLIPTA ) 62.5 MCG/ACT AEPB Inhale 1 puff into the lungs daily. 09/03/23   Kassie Acquanetta Bradley, MD    Allergies  Allergen Reactions   Estrogens Other (See Comments)    Blood clots   Other Other (See Comments)    Unable to take birth control pills due to clotting disorder/fim   Prednisone Other (See Comments)    Facial edema - has had this 2020 and had no difficulty can take in small doses   Progesterone Other (See Comments)    Blood clots   Propofol  Other (See Comments)    Became disinhibited and exacerbated restless leg syndrome during EGD/colonoscopy. Patient has requested an  alternate form of sedation during future procedures.  Legs started thrashing and skin tears.  Lisinopril Cough   Clotrimazole Rash   Tape Rash and Other (See Comments)    Please use paper tape    REVIEW OF SYSTEMS:  General: no fevers/chills/night sweats Eyes: no blurry vision, diplopia, or amaurosis ENT: no sore throat or hearing loss Resp: no cough, wheezing, or hemoptysis CV: no edema or palpitations GI: no abdominal pain, nausea, vomiting, diarrhea, or constipation GU: no dysuria, frequency, or hematuria Skin: no rash Neuro: no headache, numbness, tingling, or weakness of extremities Musculoskeletal: no joint pain or swelling Heme: no bleeding, DVT, or easy bruising Endo: no polydipsia or polyuria  BP 106/64   Pulse 68   Ht 5' 1 (1.549 m)   Wt 177 lb (80.3 kg)   SpO2 96%   BMI 33.44 kg/m   PHYSICAL EXAM: GEN:  AO x 3 in no acute distress HEENT: normal Dentition: Normal Neck: JVP normal. +2 carotid upstrokes without bruits. No thyromegaly. Lungs: equal expansion, clear bilaterally CV: Apex is discrete and nondisplaced, RRR with 3/6 SEM Abd: soft, non-tender, non-distended; no bruit; positive bowel sounds Ext: no edema, ecchymoses, or cyanosis Vascular: 2+ femoral pulses, 2+ radial pulses       Skin: warm and dry without rash Neuro: CN II-XII grossly intact; motor and sensory grossly intact    DATA AND STUDIES:  EKG:  EKG Interpretation Date/Time:  Thursday March 10 2024 09:18:54 EDT Ventricular Rate:  65 PR Interval:  190 QRS Duration:  90 QT Interval:  444 QTC Calculation: 461 R Axis:   41  Text Interpretation: Normal sinus rhythm Normal ECG When compared with ECG of 12-Nov-2022 12:48, PREVIOUS ECG IS PRESENT Confirmed by Wendel Haws ([id number]) on 03/10/2024 9:25:00 AM        Cardiac Studies & Procedures   ______________________________________________________________________________________________     ECHOCARDIOGRAM  ECHOCARDIOGRAM COMPLETE  12/02/2018  Narrative ECHOCARDIOGRAM REPORT    Patient Name:   Whitney Chavez Date of Exam: 12/02/2018 Medical Rec #:  [id number]     Height:       61.0 in Accession #:    [id number]    Weight:       247.0 lb Date of Birth:  03/06/1956    BSA:          2.07 m Patient Age:    62 years      BP:           176/88 mmHg Patient Gender: F             HR:           83 bpm. Exam Location:  [address]   Procedure: 2D Echo, Cardiac Doppler and Color Doppler  Indications:    R06.02 SOB  History:        Patient has no prior history of Echocardiogram examinations. Abnormal ECG; Aortic Valve Disease.  Sonographer:    Waldo Guadalajara RCS Referring Phys: [id number] CHI JANE ELLISON  IMPRESSIONS   1. The left ventricle has low normal systolic function, with an ejection fraction of 50-55%. The cavity size was normal. There is moderate concentric left ventricular hypertrophy. Left ventricular diastolic Doppler parameters are consistent with pseudonormalization. The E/e' is 33.6. There is Septal bounce is present. No evidence of left ventricular regional wall motion abnormalities. 2. The right ventricle has normal systolic function. The cavity was mildly enlarged. There is no increase in right ventricular wall thickness. Right ventricular systolic pressure is mildly elevated with an estimated pressure of 44.8 mmHg. 3. Left atrial size was moderately dilated.  4. Right atrial size was mildly dilated. 5. Mild thickening of the mitral valve leaflet. There is moderate mitral annular calcification present. 6. The aortic valve has an indeterminate number of cusps Mild thickening of the aortic valve Moderate calcification of the aortic valve. moderate stenosis of the aortic valve. 7. Possibly functional bicuspid aortic valve due to calcification of the left and non coronary commissure. 8. The inferior vena cava was dilated in size with <50% respiratory variability.  SUMMARY  Low normal LV EF without focal  abnormalities. Grade 2 diastolic dysfunction with mildly elevated RVSP. Aortic valve is calcified and restricted, possibly functionally bicuspid, with moderate stenosis. FINDINGS Left Ventricle: The left ventricle has low normal systolic function, with an ejection fraction of 50-55%. The cavity size was normal. There is moderate concentric left ventricular hypertrophy. Left ventricular diastolic Doppler parameters are consistent with pseudonormalization. The E/e' is 64.6. There is Septal bounce is present. No evidence of left ventricular regional wall motion abnormalities.. Right Ventricle: The right ventricle has normal systolic function. The cavity was mildly enlarged. There is no increase in right ventricular wall thickness. Right ventricular systolic pressure is mildly elevated with an estimated pressure of 44.8 mmHg. Left [facility]: left atrial size was moderately dilated Right [facility]: right atrial size was mildly dilated. Right atrial pressure is estimated at 15 mmHg. Interatrial Septum: No atrial level shunt detected by color flow Doppler. Pericardium: There is no evidence of pericardial effusion. Mitral Valve: The mitral valve is normal in structure. Mild thickening of the mitral valve leaflet. There is moderate mitral annular calcification present. Mitral valve regurgitation is mild by color flow Doppler. Tricuspid Valve: The tricuspid valve is normal in structure. Tricuspid valve regurgitation is mild by color flow Doppler. Aortic Valve: The aortic valve has an indeterminate number of cusps Mild thickening of the aortic valve Moderate calcification of the aortic valve, with mildly decreased cusp excursion. Aortic valve regurgitation was not visualized by color flow Doppler. There is moderate stenosis of the aortic valve, with a calculated valve area of 1.21 cm. Possibly functional bicuspid aortic valve due to calcification of the left and non coronary commissure. Pulmonic Valve: The pulmonic  valve was grossly normal. Pulmonic valve regurgitation is not visualized by color flow Doppler. No evidence of pulmonic stenosis. Pulmonary Artery: The pulmonary artery is not well seen. Venous: The inferior vena cava is dilated in size with less than 50% respiratory variability.  LEFT VENTRICLE PLAX 2D LVIDd:         6.22 cm  Diastology LVIDs:         4.55 cm  LV e' lateral:   8.49 cm/s LV PW:         1.76 cm  LV E/e' lateral: 15.1 LV IVS:        1.70 cm  LV e' medial:    5.77 cm/s LVOT diam:     2.10 cm  LV E/e' medial:  22.2 LV SV:         101 ml LV SV Index:   44.37 LVOT Area:     3.46 cm  RIGHT VENTRICLE RV Basal diam:  4.90 cm RV S prime:     11.20 cm/s TAPSE (M-mode): 2.4 cm RVSP:           44.8 mmHg  LEFT ATRIUM             Index       RIGHT ATRIUM           Index LA diam:  4.80 cm 2.32 cm/m  RA Pressure: 15 mmHg LA Vol (A2C):   99.1 ml 47.97 ml/m RA Area:     21.40 cm LA Vol (A4C):   87.2 ml 42.21 ml/m RA Volume:   66.20 ml  32.04 ml/m LA Biplane Vol: 95.6 ml 46.27 ml/m AORTIC VALVE AV Area (Vmax):    1.06 cm AV Area (Vmean):   0.95 cm AV Area (VTI):     1.21 cm AV Vmax:           330.00 cm/s AV Vmean:          221.000 cm/s AV VTI:            0.695 m AV Peak Grad:      43.6 mmHg AV Mean Grad:      24.0 mmHg LVOT Vmax:         101.00 cm/s LVOT Vmean:        60.700 cm/s LVOT VTI:          0.243 m LVOT/AV VTI ratio: 0.35  AORTA Ao Root diam: 3.00 cm  MITRAL VALVE               TRICUSPID VALVE TR Peak grad:   29.8 mmHg TR Vmax:        311.00 cm/s MV Decel Time: 148 msec    RVSP:           44.8 mmHg MV E velocity: 128.00 cm/s MV A velocity: 106.00 cm/s SHUNTS MV E/A ratio:  1.21        Systemic VTI:  0.24 m Systemic Diam: 2.10 cm   Shelda Bruckner MD Electronically signed by Shelda Bruckner MD Signature Date/Time: 12/02/2018/10:26:37 PM    Final           ______________________________________________________________________________________________      03/01/2024: ALT 11; BUN 40; Creatinine 5.82; Hemoglobin 11.3; Platelets 151; Potassium 5.9; Sodium 140   STS RISK CALCULATOR: Pending  NHYA CLASS: 2    ASSESSMENT AND PLAN:   1. Nonrheumatic aortic valve stenosis   2. ESRD (end stage renal disease) (HCC)   3. Factor 5 Leiden mutation, heterozygous (HCC)   4. Secondary hypercoagulable state (HCC)   5. Hyperlipidemia LDL goal <70   6. Aortic atherosclerosis (HCC)   7. Anemia due to chronic kidney disease, on chronic dialysis (HCC)   8. PAF (paroxysmal atrial fibrillation) (HCC)     Aortic stenosis: The patient has developed NYHA II symptoms of fatigue.  I reviewed her echocardiogram which demonstrates a severely calcified valve, restricted motion, and a peak velocity of greater than 4 m/s with a preserved ejection fraction.  We will refer the patient for coronary angiography.  She has a indwelling fistula in her left upper extremity.  This is 68 years old and this is the only fistula she has ever had.  Because of issues with restless leg syndrome after procedures I will pursue a right radial approach for her coronary angiography study.  Additionally if TAVR is to be pursued the patient requests dialysis the day after TAVR and prior to discharge. End-stage renal disease: Patient is currently on dialysis managed by other providers. Factor V Leyden mutation: Continue Eliquis  5 mg twice daily Secondary hypercoagulable state: Continue Eliquis  5 mg twice daily Hyperlipidemia: Continue atorvastatin 20 mg daily. Aortic atherosclerosis: Continue Eliquis  5 mg twice daily, atorvastatin 20 mg daily Anemia: Continue EPO 2000 units 3 times a week. PAF:  Continue Eliquis  5mg  BID.   I have personally reviewed the patients  imaging data as summarized above.  I have reviewed the natural history of aortic stenosis with the patient and family members  who are present today. We have discussed the limitations of medical therapy and the poor prognosis associated with symptomatic aortic stenosis. We have also reviewed potential treatment options, including palliative medical therapy, conventional surgical aortic valve replacement, and transcatheter aortic valve replacement. We discussed treatment options in the context of this patient's specific comorbid medical conditions.   All of the patient's questions were answered today. Will make further recommendations based on the results of studies outlined above.   I spent 50 minutes reviewing all clinical data during and prior to this visit including all relevant imaging studies, laboratories, clinical information from other health systems and prior notes from both Cardiology and other specialties, interviewing the patient, conducting a complete physical examination, and coordinating care in order to formulate a comprehensive and personalized evaluation and treatment plan.   Blaklee Shores K Tyris Eliot, MD  03/10/2024 10:44 AM    [facility] [address] [place], [place], [place]  [address] Phone: [phone]; Fax: [phone]

## 2024-03-05 NOTE — Progress Notes (Unsigned)
 Patient ID: Whitney Chavez MRN: [id number] DOB/AGE: Sep 12, 1956 68 y.o.  Primary Care Physician:Miller, Olam, MD Primary Cardiologist: Wendel (new)  CC:  Aortic valvular disease management     FOCUSED PROBLEM LIST:   Aortic stenosis AVA 0.44, MG 40, EF 60 TO 65% OSH TTE April 2025 EKG sinus rhythm without conduction issues 2024 End-stage renal disease Recurrent thromboembolic disease Factor V Leyden heterozygous + Prothrombin II mutation On indefinite Eliquis  Bridging with SQ heparin  for procedures PAF On Eliquis  Anemia Secondary to ESRD On EPO Hyperlipidemia Aortic atherosclerosis CT abdomen pelvis 2019 OSA On CPAP BMI 33/BSA 1.86  June 2025:  Patient consents to use of AI scribe. The patient is a 68 year old female with the above listed medical is referred for recommendations regarding her aortic valvular disease.  The patient had been seen at [facility] and was being evaluated for TAVR procedure.  She is here to discuss further.  She has been experiencing increasing fatigue over the past few months, which she attributes to her dialysis sessions and knee problems. She denies significant shortness of breath or chest pain but notes increased tiredness and occasional dizziness, with one episode of visual disturbance about three to four weeks ago.  She has been on dialysis for 14 years, with a well-functioning fistula. She experiences episodes of atrial fibrillation, typically when excessive fluid is removed during dialysis, leading to her feeling 'too dry.' She is on Eliquis  5 mg twice daily for factor V Leiden and prothrombin gene mutation, and she undergoes bridge therapy with heparin  before surgeries. She has a history of calciphylaxis and underwent a punch biopsy, followed by further surgery for a non-healing wound.  Her past medical history includes severe aortic stenosis, for which she is being evaluated. She uses a CPAP machine for severe sleep apnea and has a history of  back surgery in 2007 for L4-L5 decompression. She also reports restless leg syndrome, for which she occasionally takes oxycodone  5 mg.  She lives alone and manages her daily activities with some assistance from neighbors and a friend she met at dialysis. Her family resides in [place]  and [place] . She has a history of significant weight loss, attributed to her dialysis treatment, and she wears leg wraps to manage lymphedema, which have been effective in reducing fluid retention in her legs.  She has a history of bleeding easily, with prolonged bleeding from minor cuts and bruises, attributed to her anticoagulation therapy. No recent episodes of chest pain, shortness of breath, or significant bleeding events such as epistaxis or melena.  She sees a Education officer, community on a regular basis and reports good dental health.  She does need some dental work but nothing that is symptomatic is bothering her.        Past Medical History:  Diagnosis Date   Asthma    Axillary vein thrombosis (HCC) 08/16/2015   Calciphylaxis    Chronic kidney disease    Colitis    COPD (chronic obstructive pulmonary disease) (HCC)    Factor V Leiden (HCC)    Hypertension    Lymphedema    legs   MRSA (methicillin resistant staph aureus) culture positive    Neuropathy    Obstructive sleep apnea 03/29/2015   OSA on CPAP    Vision abnormalities     Past Surgical History:  Procedure Laterality Date   APPLICATION OF WOUND VAC     AV FISTULA PLACEMENT Left    BIOPSY  11/05/2022   Procedure: BIOPSY;  Surgeon: Burnette Fallow, MD;  Location:  [facility];  Service: Gastroenterology;;   COLONOSCOPY WITH PROPOFOL  Bilateral 11/05/2022   Procedure: COLONOSCOPY WITH PROPOFOL ;  Surgeon: Burnette Fallow, MD;  Location: [facility];  Service: Gastroenterology;  Laterality: Bilateral;   ESOPHAGOGASTRODUODENOSCOPY (EGD) WITH PROPOFOL  Bilateral 03/19/2022   Procedure: ESOPHAGOGASTRODUODENOSCOPY (EGD) WITH PROPOFOL ;  Surgeon: Burnette Fallow, MD;   Location: [facility];  Service: Gastroenterology;  Laterality: Bilateral;   lumbar decompression fusion     TONSILLECTOMY      Family History  Problem Relation Age of Onset   Diabetes Mother    Congestive Heart Failure Mother    COPD Mother    Stroke Mother    Neuropathy Mother    Heart disease Father    Stroke Father    Cancer Brother    Diabetes Brother    Diabetes Brother    Alcohol abuse Brother     Social History   Socioeconomic History   Marital status: Single    Spouse name: Not on file   Number of children: Not on file   Years of education: Not on file   Highest education level: Not on file  Occupational History   Not on file  Tobacco Use   Smoking status: Former    Current packs/day: 0.00    Average packs/day: 2.0 packs/day for 30.0 years (60.0 ttl pk-yrs)    Types: Cigarettes    Start date: 11/24/1968    Quit date: 11/25/1998    Years since quitting: 25.3   Smokeless tobacco: Never  Vaping Use   Vaping status: Never Used  Substance and Sexual Activity   Alcohol use: No    Alcohol/week: 0.0 standard drinks of alcohol   Drug use: No   Sexual activity: Not on file  Other Topics Concern   Not on file  Social History Narrative   Not on file   Social Drivers of Health   Financial Resource Strain: Not on file  Food Insecurity: No Food Insecurity (06/07/2020)   Received from [facility] visits prior to 11/15/2022.   Hunger Vital Sign    Within the past 12 months, you worried that your food would run out before you got the money to buy more.: Never true    Within the past 12 months, the food you bought just didn't last and you didn't have money to get more.: Never true  Transportation Needs: Not on file  Physical Activity: Not on file  Stress: Not on file  Social Connections: Unknown (01/13/2022)   Received from [facility]   Social Network    Social Network: Not on file  Intimate Partner Violence: Unknown (12/20/2021)   Received from  [facility]   HITS    Physically Hurt: Not on file    Insult or Talk Down To: Not on file    Threaten Physical Harm: Not on file    Scream or Curse: Not on file     Prior to Admission medications   Medication Sig Start Date End Date Taking? Authorizing Provider  acetaminophen (TYLENOL) 500 MG tablet Take 500 mg by mouth every 6 (six) hours as needed for moderate pain.    [provider]  amiodarone (PACERONE) 200 MG tablet Take 200 mg by mouth in the morning. 12/28/18   [provider]  apixaban  (ELIQUIS ) 5 MG TABS tablet Take 1 tablet (5 mg total) by mouth 2 (two) times daily. 10/13/22 03/01/24  Timmy Maude SAUNDERS, MD  atorvastatin (LIPITOR) 20 MG tablet Take 20 mg by mouth every  evening. 01/03/19   [provider]  B Complex-C-Folic Acid (RENAL VITAMIN PO) Take 1 tablet by mouth daily.    [provider]  BREYNA  160-4.5 MCG/ACT inhaler Inhale 2 puffs into the lungs 2 (two) times daily. 09/04/23   Kassie Acquanetta Bradley, MD  cetirizine (ZYRTEC) 10 MG tablet Take 10 mg by mouth in the morning.    [provider]  Elastic Bandages & Supports (MEDICAL COMPRESSION STOCKINGS) MISC by Does not apply route. Leg wraps.    [provider]  epoetin alfa (EPOGEN) 2000 UNIT/ML injection 2,000 Units 3 (three) times a week.    [provider]  Etelcalcetide HCl (PARSABIV IV) Inject 1 Dose into the vein 3 (three) times a week. Receives at Dialysis    [provider]  gabapentin  (NEURONTIN ) 100 MG capsule Take 1 capsule (100 mg total) by mouth 4 (four) times daily. 08/04/23   Lomax, Amy, NP  heparin  5000 UNIT/ML injection Inject 1 mL (5,000 Units total) into the skin 2 (two) times daily. To be given subcutaneously for 3 days prior to surgery 03/02/24   Tonette Lauraine HERO, PA-C  lanthanum  (FOSRENOL ) 500 MG chewable tablet Chew 1,000-2,000 mg by mouth 2 (two) times daily with a meal.  500 mg with each snack 12/04/16   [provider]   omeprazole (PRILOSEC) 20 MG capsule Take 20 mg by mouth daily as needed (acid reflux). Rarely takes    [provider]  ondansetron (ZOFRAN-ODT) 4 MG disintegrating tablet Take 4 mg by mouth every Monday, Wednesday, and Friday.    [provider]  Ondansetron HCl (ZOFRAN PO) Take 1 tablet by mouth as needed.    [provider]  oxyCODONE  (ROXICODONE ) 5 MG immediate release tablet Take one po qd formore severe pain 12/14/23   Sater, Charlie LABOR, MD  PRESCRIPTION MEDICATION Inhale into the lungs at bedtime. CPAP with nasal pillow    [provider]  rOPINIRole  (REQUIP ) 0.5 MG tablet TAKE 1-2 TABLETS BY MOUTH IN THE MORNING. 08/04/23   Lomax, Amy, NP  rOPINIRole  (REQUIP ) 2 MG tablet Take 1 tablet (2 mg total) by mouth at bedtime. 08/04/23   Lomax, Amy, NP  SODIUM THIOSULFATE IV Inject 1 Dose into the vein 3 (three) times a week. Receives at Dialysis    [provider]  sulfaSALAzine  (AZULFIDINE ) 500 MG tablet Take 500 mg by mouth 4 (four) times daily. 02/15/15   [provider]  traMADol  (ULTRAM ) 50 MG tablet TAKE 1 TABLET (50 MG TOTAL) BY MOUTH 2 (TWO) TIMES DAILY AS NEEDED (PAIN). 12/17/23   Sater, Charlie LABOR, MD  triamcinolone cream (KENALOG) 0.1 % Apply 1 Application topically daily as needed (itching). 08/31/18   [provider]  umeclidinium bromide  (INCRUSE ELLIPTA ) 62.5 MCG/ACT AEPB Inhale 1 puff into the lungs daily. 09/03/23   Kassie Acquanetta Bradley, MD    Allergies  Allergen Reactions   Estrogens Other (See Comments)    Blood clots   Other Other (See Comments)    Unable to take birth control pills due to clotting disorder/fim   Prednisone Other (See Comments)    Facial edema - has had this 2020 and had no difficulty can take in small doses   Progesterone Other (See Comments)    Blood clots   Propofol  Other (See Comments)    Became disinhibited and exacerbated restless leg syndrome during EGD/colonoscopy. Patient has requested an  alternate form of sedation during future procedures.  Legs started thrashing and skin tears.  Lisinopril Cough   Clotrimazole Rash   Tape Rash and Other (See Comments)    Please use paper tape    REVIEW OF SYSTEMS:  General: no fevers/chills/night sweats Eyes: no blurry vision, diplopia, or amaurosis ENT: no sore throat or hearing loss Resp: no cough, wheezing, or hemoptysis CV: no edema or palpitations GI: no abdominal pain, nausea, vomiting, diarrhea, or constipation GU: no dysuria, frequency, or hematuria Skin: no rash Neuro: no headache, numbness, tingling, or weakness of extremities Musculoskeletal: no joint pain or swelling Heme: no bleeding, DVT, or easy bruising Endo: no polydipsia or polyuria  BP 106/64   Pulse 68   Ht 5' 1 (1.549 m)   Wt 177 lb (80.3 kg)   SpO2 96%   BMI 33.44 kg/m   PHYSICAL EXAM: GEN:  AO x 3 in no acute distress HEENT: normal Dentition: Normal Neck: JVP normal. +2 carotid upstrokes without bruits. No thyromegaly. Lungs: equal expansion, clear bilaterally CV: Apex is discrete and nondisplaced, RRR with 3/6 SEM Abd: soft, non-tender, non-distended; no bruit; positive bowel sounds Ext: no edema, ecchymoses, or cyanosis Vascular: 2+ femoral pulses, 2+ radial pulses       Skin: warm and dry without rash Neuro: CN II-XII grossly intact; motor and sensory grossly intact    DATA AND STUDIES:  EKG:  EKG Interpretation Date/Time:  Thursday March 10 2024 09:18:54 EDT Ventricular Rate:  65 PR Interval:  190 QRS Duration:  90 QT Interval:  444 QTC Calculation: 461 R Axis:   41  Text Interpretation: Normal sinus rhythm Normal ECG When compared with ECG of 12-Nov-2022 12:48, PREVIOUS ECG IS PRESENT Confirmed by Wendel Haws ([id number]) on 03/10/2024 9:25:00 AM        Cardiac Studies & Procedures   ______________________________________________________________________________________________     ECHOCARDIOGRAM  ECHOCARDIOGRAM COMPLETE  12/02/2018  Narrative ECHOCARDIOGRAM REPORT    Patient Name:   MICKAYLA TROUTEN Date of Exam: 12/02/2018 Medical Rec #:  [id number]     Height:       61.0 in Accession #:    [id number]    Weight:       247.0 lb Date of Birth:  03/06/1956    BSA:          2.07 m Patient Age:    62 years      BP:           176/88 mmHg Patient Gender: F             HR:           83 bpm. Exam Location:  [address]   Procedure: 2D Echo, Cardiac Doppler and Color Doppler  Indications:    R06.02 SOB  History:        Patient has no prior history of Echocardiogram examinations. Abnormal ECG; Aortic Valve Disease.  Sonographer:    Waldo Guadalajara RCS Referring Phys: [id number] CHI JANE ELLISON  IMPRESSIONS   1. The left ventricle has low normal systolic function, with an ejection fraction of 50-55%. The cavity size was normal. There is moderate concentric left ventricular hypertrophy. Left ventricular diastolic Doppler parameters are consistent with pseudonormalization. The E/e' is 33.6. There is Septal bounce is present. No evidence of left ventricular regional wall motion abnormalities. 2. The right ventricle has normal systolic function. The cavity was mildly enlarged. There is no increase in right ventricular wall thickness. Right ventricular systolic pressure is mildly elevated with an estimated pressure of 44.8 mmHg. 3. Left atrial size was moderately dilated.  4. Right atrial size was mildly dilated. 5. Mild thickening of the mitral valve leaflet. There is moderate mitral annular calcification present. 6. The aortic valve has an indeterminate number of cusps Mild thickening of the aortic valve Moderate calcification of the aortic valve. moderate stenosis of the aortic valve. 7. Possibly functional bicuspid aortic valve due to calcification of the left and non coronary commissure. 8. The inferior vena cava was dilated in size with <50% respiratory variability.  SUMMARY  Low normal LV EF without focal  abnormalities. Grade 2 diastolic dysfunction with mildly elevated RVSP. Aortic valve is calcified and restricted, possibly functionally bicuspid, with moderate stenosis. FINDINGS Left Ventricle: The left ventricle has low normal systolic function, with an ejection fraction of 50-55%. The cavity size was normal. There is moderate concentric left ventricular hypertrophy. Left ventricular diastolic Doppler parameters are consistent with pseudonormalization. The E/e' is 64.6. There is Septal bounce is present. No evidence of left ventricular regional wall motion abnormalities.. Right Ventricle: The right ventricle has normal systolic function. The cavity was mildly enlarged. There is no increase in right ventricular wall thickness. Right ventricular systolic pressure is mildly elevated with an estimated pressure of 44.8 mmHg. Left [facility]: left atrial size was moderately dilated Right [facility]: right atrial size was mildly dilated. Right atrial pressure is estimated at 15 mmHg. Interatrial Septum: No atrial level shunt detected by color flow Doppler. Pericardium: There is no evidence of pericardial effusion. Mitral Valve: The mitral valve is normal in structure. Mild thickening of the mitral valve leaflet. There is moderate mitral annular calcification present. Mitral valve regurgitation is mild by color flow Doppler. Tricuspid Valve: The tricuspid valve is normal in structure. Tricuspid valve regurgitation is mild by color flow Doppler. Aortic Valve: The aortic valve has an indeterminate number of cusps Mild thickening of the aortic valve Moderate calcification of the aortic valve, with mildly decreased cusp excursion. Aortic valve regurgitation was not visualized by color flow Doppler. There is moderate stenosis of the aortic valve, with a calculated valve area of 1.21 cm. Possibly functional bicuspid aortic valve due to calcification of the left and non coronary commissure. Pulmonic Valve: The pulmonic  valve was grossly normal. Pulmonic valve regurgitation is not visualized by color flow Doppler. No evidence of pulmonic stenosis. Pulmonary Artery: The pulmonary artery is not well seen. Venous: The inferior vena cava is dilated in size with less than 50% respiratory variability.  LEFT VENTRICLE PLAX 2D LVIDd:         6.22 cm  Diastology LVIDs:         4.55 cm  LV e' lateral:   8.49 cm/s LV PW:         1.76 cm  LV E/e' lateral: 15.1 LV IVS:        1.70 cm  LV e' medial:    5.77 cm/s LVOT diam:     2.10 cm  LV E/e' medial:  22.2 LV SV:         101 ml LV SV Index:   44.37 LVOT Area:     3.46 cm  RIGHT VENTRICLE RV Basal diam:  4.90 cm RV S prime:     11.20 cm/s TAPSE (M-mode): 2.4 cm RVSP:           44.8 mmHg  LEFT ATRIUM             Index       RIGHT ATRIUM           Index LA diam:  4.80 cm 2.32 cm/m  RA Pressure: 15 mmHg LA Vol (A2C):   99.1 ml 47.97 ml/m RA Area:     21.40 cm LA Vol (A4C):   87.2 ml 42.21 ml/m RA Volume:   66.20 ml  32.04 ml/m LA Biplane Vol: 95.6 ml 46.27 ml/m AORTIC VALVE AV Area (Vmax):    1.06 cm AV Area (Vmean):   0.95 cm AV Area (VTI):     1.21 cm AV Vmax:           330.00 cm/s AV Vmean:          221.000 cm/s AV VTI:            0.695 m AV Peak Grad:      43.6 mmHg AV Mean Grad:      24.0 mmHg LVOT Vmax:         101.00 cm/s LVOT Vmean:        60.700 cm/s LVOT VTI:          0.243 m LVOT/AV VTI ratio: 0.35  AORTA Ao Root diam: 3.00 cm  MITRAL VALVE               TRICUSPID VALVE TR Peak grad:   29.8 mmHg TR Vmax:        311.00 cm/s MV Decel Time: 148 msec    RVSP:           44.8 mmHg MV E velocity: 128.00 cm/s MV A velocity: 106.00 cm/s SHUNTS MV E/A ratio:  1.21        Systemic VTI:  0.24 m Systemic Diam: 2.10 cm   Shelda Bruckner MD Electronically signed by Shelda Bruckner MD Signature Date/Time: 12/02/2018/10:26:37 PM    Final           ______________________________________________________________________________________________      03/01/2024: ALT 11; BUN 40; Creatinine 5.82; Hemoglobin 11.3; Platelets 151; Potassium 5.9; Sodium 140   STS RISK CALCULATOR: Pending  NHYA CLASS: 2    ASSESSMENT AND PLAN:   1. Nonrheumatic aortic valve stenosis   2. ESRD (end stage renal disease) (HCC)   3. Factor 5 Leiden mutation, heterozygous (HCC)   4. Secondary hypercoagulable state (HCC)   5. Hyperlipidemia LDL goal <70   6. Aortic atherosclerosis (HCC)   7. Anemia due to chronic kidney disease, on chronic dialysis (HCC)   8. PAF (paroxysmal atrial fibrillation) (HCC)     Aortic stenosis: The patient has developed NYHA II symptoms of fatigue.  I reviewed her echocardiogram which demonstrates a severely calcified valve, restricted motion, and a peak velocity of greater than 4 m/s with a preserved ejection fraction.  We will refer the patient for coronary angiography.  She has a indwelling fistula in her left upper extremity.  This is 68 years old and this is the only fistula she has ever had.  Because of issues with restless leg syndrome after procedures I will pursue a right radial approach for her coronary angiography study.  Additionally if TAVR is to be pursued the patient requests dialysis the day after TAVR and prior to discharge. End-stage renal disease: Patient is currently on dialysis managed by other providers. Factor V Leyden mutation: Continue Eliquis  5 mg twice daily Secondary hypercoagulable state: Continue Eliquis  5 mg twice daily Hyperlipidemia: Continue atorvastatin 20 mg daily. Aortic atherosclerosis: Continue Eliquis  5 mg twice daily, atorvastatin 20 mg daily Anemia: Continue EPO 2000 units 3 times a week. PAF:  Continue Eliquis  5mg  BID.   I have personally reviewed the patients  imaging data as summarized above.  I have reviewed the natural history of aortic stenosis with the patient and family members  who are present today. We have discussed the limitations of medical therapy and the poor prognosis associated with symptomatic aortic stenosis. We have also reviewed potential treatment options, including palliative medical therapy, conventional surgical aortic valve replacement, and transcatheter aortic valve replacement. We discussed treatment options in the context of this patient's specific comorbid medical conditions.   All of the patient's questions were answered today. Will make further recommendations based on the results of studies outlined above.   I spent 50 minutes reviewing all clinical data during and prior to this visit including all relevant imaging studies, laboratories, clinical information from other health systems and prior notes from both Cardiology and other specialties, interviewing the patient, conducting a complete physical examination, and coordinating care in order to formulate a comprehensive and personalized evaluation and treatment plan.   Blaklee Shores K Tyris Eliot, MD  03/10/2024 10:44 AM    [facility] [address] [place], [place], [place]  [address] Phone: [phone]; Fax: [phone]

## 2024-03-08 ENCOUNTER — Other Ambulatory Visit (HOSPITAL_COMMUNITY): Payer: Self-pay

## 2024-03-10 ENCOUNTER — Emergency Department (HOSPITAL_BASED_OUTPATIENT_CLINIC_OR_DEPARTMENT_OTHER): Admission: EM | Admit: 2024-03-10 | Discharge: 2024-03-10 | Disposition: A | Discharge status: Home / Self Care

## 2024-03-10 ENCOUNTER — Other Ambulatory Visit: Payer: Self-pay

## 2024-03-10 ENCOUNTER — Emergency Department (HOSPITAL_BASED_OUTPATIENT_CLINIC_OR_DEPARTMENT_OTHER)

## 2024-03-10 ENCOUNTER — Ambulatory Visit (INDEPENDENT_AMBULATORY_CARE_PROVIDER_SITE_OTHER): Admitting: Internal Medicine

## 2024-03-10 ENCOUNTER — Encounter: Payer: Self-pay | Admitting: Internal Medicine

## 2024-03-10 ENCOUNTER — Encounter (HOSPITAL_BASED_OUTPATIENT_CLINIC_OR_DEPARTMENT_OTHER): Payer: Self-pay | Admitting: Emergency Medicine

## 2024-03-10 VITALS — BP 106/64 | HR 68 | Ht 61.0 in | Wt 177.0 lb

## 2024-03-10 DIAGNOSIS — I480 Paroxysmal atrial fibrillation: Secondary | ICD-10-CM | POA: Diagnosis present

## 2024-03-10 DIAGNOSIS — D6869 Other thrombophilia: Secondary | ICD-10-CM | POA: Diagnosis present

## 2024-03-10 DIAGNOSIS — X58XXXA Exposure to other specified factors, initial encounter: Secondary | ICD-10-CM | POA: Insufficient documentation

## 2024-03-10 DIAGNOSIS — E785 Hyperlipidemia, unspecified: Secondary | ICD-10-CM | POA: Diagnosis present

## 2024-03-10 DIAGNOSIS — Z992 Dependence on renal dialysis: Secondary | ICD-10-CM

## 2024-03-10 DIAGNOSIS — D6851 Activated protein C resistance: Secondary | ICD-10-CM

## 2024-03-10 DIAGNOSIS — Z7901 Long term (current) use of anticoagulants: Secondary | ICD-10-CM | POA: Diagnosis not present

## 2024-03-10 DIAGNOSIS — I700 Atherosclerosis of aorta: Secondary | ICD-10-CM | POA: Diagnosis present

## 2024-03-10 DIAGNOSIS — S90122A Contusion of left lesser toe(s) without damage to nail, initial encounter: Secondary | ICD-10-CM | POA: Insufficient documentation

## 2024-03-10 DIAGNOSIS — S99922A Unspecified injury of left foot, initial encounter: Secondary | ICD-10-CM | POA: Diagnosis present

## 2024-03-10 DIAGNOSIS — I350 Nonrheumatic aortic (valve) stenosis: Secondary | ICD-10-CM | POA: Diagnosis present

## 2024-03-10 DIAGNOSIS — M79672 Pain in left foot: Secondary | ICD-10-CM

## 2024-03-10 DIAGNOSIS — D631 Anemia in chronic kidney disease: Secondary | ICD-10-CM

## 2024-03-10 DIAGNOSIS — N186 End stage renal disease: Secondary | ICD-10-CM | POA: Diagnosis present

## 2024-03-10 NOTE — ED Notes (Signed)
 Pt alert and oriented X 4 at the time of discharge. RR even and unlabored. No acute distress noted. Pt verbalized understanding of discharge instructions as discussed. Pt ambulatory to lobby at time of discharge.

## 2024-03-10 NOTE — Patient Instructions (Addendum)
 Medication Instructions:  No changes *If you need a refill on your cardiac medications before your next appointment, please call your pharmacy*  Lab Work: None today If you have labs (blood work) drawn today and your tests are completely normal, you will receive your results only by: MyChart Message (if you have MyChart) OR A paper copy in the mail If you have any lab test that is abnormal or we need to change your treatment, we will call you to review the results.  Testing/Procedures: Your physician has requested that you have a cardiac catheterization. Cardiac catheterization is used to diagnose and/or treat various heart conditions. Doctors may recommend this procedure for a number of different reasons. The most common reason is to evaluate chest pain. Chest pain can be a symptom of coronary artery disease (CAD), and cardiac catheterization can show whether plaque is narrowing or blocking your heart's arteries. This procedure is also used to evaluate the valves, as well as measure the blood flow and oxygen levels in different parts of your heart. For further information please visit [email or web address] Please follow instruction sheet, as given.   Follow-Up: Per Structural Heart Team         Cardiac/Peripheral Catheterization   You are scheduled for a Cardiac Catheterization on Tuesday, July 17 with Dr. Lurena Red.  1. Please arrive at the [facility]Main Entrance A[facility] at [facility]: [address] [place], [place] [address] at 5:30 AM (This time is TWO hour(s) before your procedure to ensure your preparation).   Free valet parking service is available. You will check in at ADMITTING. The support person will be asked to wait in the waiting room.  It is OK to have someone drop you off and come back when you are ready to be discharged.        Special note: Every effort is made to have your procedure done on time. Please understand that emergencies sometimes delay  scheduled procedures.  2. Diet: Do not eat solid foods after midnight.  You may have clear liquids until 5 AM the day of the procedure.  3. Labs: completed 03/01/24  4. Medication instructions in preparation for your procedure:   Contrast Allergy: No  No Eliquis  after evening dose on Saturday July 12  On the morning of your procedure, take Aspirin 81 mg and any morning medicines NOT listed above.  You may use sips of water.  5. Plan to go home the same day, you will only stay overnight if medically necessary. 6. You MUST have a responsible adult to drive you home. 7. An adult MUST be with you the first 24 hours after you arrive home. 8. Bring a current list of your medications, and the last time and date medication taken. 9. Bring ID and current insurance cards. 10.Please wear clothes that are easy to get on and off and wear slip-on shoes.  Thank you for allowing us  to care for you!   -- [facility]

## 2024-03-10 NOTE — ED Triage Notes (Signed)
 Left foot pain , . No injury . Dialysis patient . Obvious bruising to 2, 3 , and 4 th toe . Hx neuropathy

## 2024-03-10 NOTE — Progress Notes (Signed)
 Pre Surgical Assessment: 5 M Walk Test  34M=16.56ft  5 Meter Walk Test- trial 1: 7.03 seconds 5 Meter Walk Test- trial 2: 6.57 seconds 5 Meter Walk Test- trial 3: 6.42 seconds 5 Meter Walk Test Average: 6.67 seconds

## 2024-03-10 NOTE — Discharge Instructions (Signed)
 As we discussed, the x-rays of your foot were normal.  This could be either from or for extension while you are stretching for the neuropathy or could be from the calciphylaxis.  I would follow-up with your dialysis center tomorrow and or your primary care doctor.  You can return to the emergency department for any worsening symptoms.

## 2024-03-10 NOTE — ED Provider Notes (Signed)
 [place] EMERGENCY DEPARTMENT AT [facility] [place] Provider Note   CSN: [id number] Arrival date & time: 03/10/24  1421     Patient presents with: Foot Pain (left)   Whitney Chavez is a 68 y.o. female patient who presents to the emergency department today for further evaluation of left fourth toe pain.  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  She states that she had a nodule between the 4th and 5th webspace of the toes that she was trying to put some Neosporin on and thinks she might of bumped her toe.  She states that her bones are more brittle and she is questioning whether or not she broke it.  There is some surrounding ecchymosis per the patient.    Foot Pain       Prior to Admission medications   Medication Sig Start Date End Date Taking? Authorizing Provider  acetaminophen (TYLENOL) 500 MG tablet Take 500 mg by mouth every 6 (six) hours as needed for moderate pain.    [provider]  amiodarone (PACERONE) 200 MG tablet Take 200 mg by mouth in the morning. 12/28/18   [provider]  apixaban  (ELIQUIS ) 5 MG TABS tablet Take 1 tablet (5 mg total) by mouth 2 (two) times daily. 10/13/22 03/10/24  Timmy Maude SAUNDERS, MD  atorvastatin (LIPITOR) 20 MG tablet Take 20 mg by mouth every evening. 01/03/19   [provider]  B Complex-C-Folic Acid (RENAL VITAMIN PO) Take 1 tablet by mouth daily.    [provider]  BREYNA  160-4.5 MCG/ACT inhaler Inhale 2 puffs into the lungs 2 (two) times daily. 09/04/23   Kassie Acquanetta Bradley, MD  cetirizine (ZYRTEC) 10 MG tablet Take 10 mg by mouth in the morning.    [provider]  Elastic Bandages & Supports (MEDICAL COMPRESSION STOCKINGS) MISC by Does not apply route. Leg wraps.    [provider]  epoetin alfa (EPOGEN) 2000 UNIT/ML injection 2,000 Units 3 (three) times a week.    [provider]  Etelcalcetide HCl (PARSABIV IV) Inject 1 Dose into the vein 3  (three) times a week. Receives at Dialysis    [provider]  gabapentin  (NEURONTIN ) 100 MG capsule Take 1 capsule (100 mg total) by mouth 4 (four) times daily. 08/04/23   Lomax, Amy, NP  heparin  5000 UNIT/ML injection Inject 1 mL (5,000 Units total) into the skin 2 (two) times daily. To be given subcutaneously for 3 days prior to surgery 03/02/24   Tonette Lauraine HERO, PA-C  lanthanum  (FOSRENOL ) 500 MG chewable tablet Chew 1,000-2,000 mg by mouth 2 (two) times daily with a meal.  500 mg with each snack 12/04/16   [provider]  omeprazole (PRILOSEC) 20 MG capsule Take 20 mg by mouth daily as needed (acid reflux). Rarely takes    [provider]  ondansetron (ZOFRAN-ODT) 4 MG disintegrating tablet Take 4 mg by mouth every Monday, Wednesday, and Friday.    [provider]  Ondansetron HCl (ZOFRAN PO) Take 1 tablet by mouth as needed.    [provider]  oxyCODONE  (ROXICODONE ) 5 MG immediate release tablet Take one po qd formore severe pain 12/14/23   Sater, Charlie LABOR, MD  PRESCRIPTION MEDICATION Inhale into the lungs at bedtime. CPAP with nasal pillow    [provider]  rOPINIRole  (REQUIP ) 0.5 MG tablet TAKE 1-2 TABLETS BY MOUTH IN THE MORNING. 08/04/23   Lomax, Amy, NP  rOPINIRole  (REQUIP ) 2 MG tablet Take 1  tablet (2 mg total) by mouth at bedtime. 08/04/23   Lomax, Amy, NP  SODIUM THIOSULFATE IV Inject 1 Dose into the vein 3 (three) times a week. Receives at Dialysis    [provider]  sulfaSALAzine  (AZULFIDINE ) 500 MG tablet Take 500 mg by mouth 4 (four) times daily. 02/15/15   [provider]  traMADol  (ULTRAM ) 50 MG tablet TAKE 1 TABLET (50 MG TOTAL) BY MOUTH 2 (TWO) TIMES DAILY AS NEEDED (PAIN). 12/17/23   Sater, Charlie LABOR, MD  triamcinolone cream (KENALOG) 0.1 % Apply 1 Application topically daily as needed (itching). 08/31/18   [provider]  umeclidinium bromide  (INCRUSE ELLIPTA ) 62.5 MCG/ACT AEPB Inhale 1 puff  into the lungs daily. 09/03/23   Kassie Acquanetta Bradley, MD    Allergies: Estrogens, Other, Prednisone, Progesterone, Propofol , Lisinopril, Clotrimazole, and Tape    Review of Systems  All other systems reviewed and are negative.   Updated Vital Signs BP 122/71 (BP Location: Right Arm)   Pulse 72   Temp 98 F (36.7 C) (Oral)   Resp 18   Wt 80 kg   SpO2 97%   BMI 33.32 kg/m   Physical Exam Vitals and nursing note reviewed.  Constitutional:      Appearance: Normal appearance.  HENT:     Head: Normocephalic and atraumatic.   Eyes:     General:        Right eye: No discharge.        Left eye: No discharge.     Conjunctiva/sclera: Conjunctivae normal.   Pulmonary:     Effort: Pulmonary effort is normal.   Musculoskeletal:     Comments: There is ecchymosis to the 2nd, 3rd and 4th metatarsals.  No obvious wound or deformity.  2+ dorsalis pedis pulse felt in the left foot.   Skin:    General: Skin is warm and dry.     Findings: No rash.   Neurological:     General: No focal deficit present.     Mental Status: She is alert.   Psychiatric:        Mood and Affect: Mood normal.        Behavior: Behavior normal.     (all labs ordered are listed, but only abnormal results are displayed) Labs Reviewed - No data to display  EKG: None  Radiology: DG Foot Complete Left Result Date: 03/10/2024 CLINICAL DATA:  pain and bruising EXAM: LEFT FOOT - COMPLETE 3+ VIEW COMPARISON:  None Available. FINDINGS: No acute fracture or dislocation. Moderate spurring in the midfoot. Lucencies in the calcaneus and navicular bone, which may be degenerative. Undersurface calcaneal heel spurring. Peripheral vascular atherosclerosis. IMPRESSION: Osteopenia.  No acute fracture or dislocation. Electronically Signed   By: Rogelia Herringshaw M.D.   On: 03/10/2024 15:27     Procedures   Medications Ordered in the ED - No data to display     Medical Decision Making Whitney Chavez is a 68 y.o. female  patient who presents to the emergency department today for further evaluation of left-sided foot pain.  This could be traumatic injury either from direct trauma or banging against the limb.  Patient does have neuropathy and does have less feeling in her feet.  Will get an x-ray to rule out any fracture.  Patient does also have calciphylaxis.  Could also be possible calcium buildup more in the small vessels.  Imaging was normal. No signs of fracture. Will have her follow-up with her primary care doctor.  Will  treat conservatively with Tylenol.  Strict turn precautions were discussed.  She is safe for discharge at this time.   Amount and/or Complexity of Data Reviewed Radiology: ordered.     Final diagnoses:  Left foot pain  Traumatic ecchymosis of toe of left foot, initial encounter    ED Discharge Orders     None          Theotis Cameron HERO, [place] 03/10/24 1643    Ula Prentice SAUNDERS, MD 03/11/24 1659

## 2024-03-14 ENCOUNTER — Other Ambulatory Visit (HOSPITAL_COMMUNITY): Payer: Self-pay

## 2024-03-14 ENCOUNTER — Other Ambulatory Visit: Payer: Self-pay | Admitting: Medical Oncology

## 2024-03-14 MED ORDER — HEPARIN SODIUM (PORCINE) 5000 UNIT/ML IJ SOLN
INTRAMUSCULAR | 0 refills | Status: DC
  Filled 2024-03-14: qty 6, 3d supply, fill #0

## 2024-03-24 ENCOUNTER — Telehealth: Payer: Self-pay

## 2024-03-24 NOTE — Telephone Encounter (Signed)
   Pre-operative Risk Assessment    Patient Name: Whitney Chavez  DOB: Apr 04, 1956 MRN: [id number]   Date of last office visit: 03/10/24 Whitney RED, MD Date of next office visit: NONE   Request for Surgical Clearance    Procedure:  CATARACT EXTRACTION W/ INTRAOCULAR LENS IMPLANTATION OF THE LEFT EYE, FOLLOWED BY THE RIGHT EYE   Date of Surgery:  Clearance 05/25/24   &   06/08/24                            Surgeon:  NOT INDICATED Surgeon's Group or Practice Name:  [facility] Phone number:  [phone] Fax number:  [phone]   Type of Clearance Requested:   - Medical  - Pharmacy:  Hold Apixaban  (Eliquis )     Type of Anesthesia:  Not Indicated   Additional requests/questions:    Signed, Lucie DELENA Ku   03/24/2024, 5:13 PM

## 2024-03-25 NOTE — Telephone Encounter (Signed)
   Patient Name: Whitney Chavez  DOB: 1956-02-07 MRN: [id number]  Primary Cardiologist: None  Chart reviewed as part of pre-operative protocol coverage. Cataract extractions are recognized in guidelines as low risk surgeries that do not typically require specific preoperative testing or holding of blood thinner therapy. Therefore, given past medical history and time since last visit, based on ACC/AHA guidelines, BROOKIE WAYMENT would be at acceptable risk for the planned procedure without further cardiovascular testing.   I will route this recommendation to the requesting party via Epic fax function and remove from pre-op pool.  Patient is currently on Eliquis , initial request suggested holding Eliquis , she has very high thrombotic risk.  Please verify with the requesting provider to see if they absolutely need him to come off of Eliquis  for cataract procedure.    Per Dr. Parry last note: Recurrent thromboembolic disease Factor V Leyden heterozygous + Prothrombin II mutation On indefinite Eliquis  Bridging with SQ heparin  for procedures  Please call with questions.  Jaeger Trueheart, [place] 03/25/2024, 10:06 AM

## 2024-03-27 ENCOUNTER — Encounter: Payer: Self-pay | Admitting: Internal Medicine

## 2024-03-28 ENCOUNTER — Encounter: Payer: Self-pay | Admitting: *Deleted

## 2024-03-28 ENCOUNTER — Telehealth: Payer: Self-pay | Admitting: *Deleted

## 2024-03-28 NOTE — Telephone Encounter (Signed)
 See Phone Note 03/28/24. I discussed with patient.

## 2024-03-28 NOTE — Telephone Encounter (Addendum)
 Cardiac Catheterization scheduled at [facility] for: Tuesday March 29, 2024 7:30 AM Arrival time [facility] Main Entrance A at: 5:30 AM  Nothing to eat after midnight prior to procedure, clear liquids until 5 AM day of procedure.  Medication instructions: -Hold:  Eliquis /Heparin :  Pt tells me that she has not had any Eliquis  since 03/25/24 and knows to continue to hold until post procedure.  Pt tells me last heparin  was 03/28/24 at 6 AM. -Other usual morning medications can be taken with sips of water including aspirin  81 mg.  Plan to go home the same day, you will only stay overnight if medically necessary.  You must have responsible adult to drive you home.  Someone must be with you the first 24 hours after you arrive home.  Patient tells me usual dialysis schedule MWF. Patient is aware that she would not be able to be admitted overnight to have dialysis at the hospital the following morning before being discharged. Patient advised if she does stay overnight, she would need to contact her dialysis center to make arrangements for dialysis.  Reviewed procedure instructions with patient.

## 2024-03-29 ENCOUNTER — Ambulatory Visit (HOSPITAL_COMMUNITY)
Admission: RE | Admit: 2024-03-29 | Discharge: 2024-03-29 | Disposition: A | Discharge status: Home / Self Care | Attending: Internal Medicine | Admitting: Internal Medicine

## 2024-03-29 ENCOUNTER — Encounter (HOSPITAL_COMMUNITY)
Admission: RE | Disposition: A | Discharge status: Home / Self Care | Payer: Self-pay | Source: Home / Self Care | Attending: Internal Medicine

## 2024-03-29 ENCOUNTER — Encounter (HOSPITAL_COMMUNITY): Payer: Self-pay | Admitting: Internal Medicine

## 2024-03-29 ENCOUNTER — Other Ambulatory Visit: Payer: Self-pay

## 2024-03-29 DIAGNOSIS — D6851 Activated protein C resistance: Secondary | ICD-10-CM | POA: Diagnosis not present

## 2024-03-29 DIAGNOSIS — N186 End stage renal disease: Secondary | ICD-10-CM | POA: Insufficient documentation

## 2024-03-29 DIAGNOSIS — Z7901 Long term (current) use of anticoagulants: Secondary | ICD-10-CM | POA: Insufficient documentation

## 2024-03-29 DIAGNOSIS — I480 Paroxysmal atrial fibrillation: Secondary | ICD-10-CM | POA: Diagnosis not present

## 2024-03-29 DIAGNOSIS — I350 Nonrheumatic aortic (valve) stenosis: Secondary | ICD-10-CM

## 2024-03-29 DIAGNOSIS — Z87891 Personal history of nicotine dependence: Secondary | ICD-10-CM | POA: Insufficient documentation

## 2024-03-29 DIAGNOSIS — I82C11 Acute embolism and thrombosis of right internal jugular vein: Secondary | ICD-10-CM | POA: Insufficient documentation

## 2024-03-29 DIAGNOSIS — Z992 Dependence on renal dialysis: Secondary | ICD-10-CM | POA: Diagnosis not present

## 2024-03-29 DIAGNOSIS — I2510 Atherosclerotic heart disease of native coronary artery without angina pectoris: Secondary | ICD-10-CM | POA: Diagnosis not present

## 2024-03-29 DIAGNOSIS — E785 Hyperlipidemia, unspecified: Secondary | ICD-10-CM | POA: Insufficient documentation

## 2024-03-29 DIAGNOSIS — I700 Atherosclerosis of aorta: Secondary | ICD-10-CM | POA: Diagnosis not present

## 2024-03-29 DIAGNOSIS — I120 Hypertensive chronic kidney disease with stage 5 chronic kidney disease or end stage renal disease: Secondary | ICD-10-CM | POA: Diagnosis not present

## 2024-03-29 HISTORY — PX: CORONARY ANGIOGRAPHY: CATH118303

## 2024-03-29 HISTORY — PX: LOWER EXTREMITY ANGIOGRAPHY: CATH118251

## 2024-03-29 HISTORY — PX: UPPER EXTREMITY VENOGRAPHY: CATH118272

## 2024-03-29 LAB — BASIC METABOLIC PANEL WITH GFR
Anion gap: 17 — ABNORMAL HIGH (ref 5–15)
BUN: 24 mg/dL — ABNORMAL HIGH (ref 8–23)
CO2: 23 mmol/L (ref 22–32)
Calcium: 8.6 mg/dL — ABNORMAL LOW (ref 8.9–10.3)
Chloride: 96 mmol/L — ABNORMAL LOW (ref 98–111)
Creatinine, Ser: 4.53 mg/dL — ABNORMAL HIGH (ref 0.44–1.00)
GFR, Estimated: 10 mL/min — ABNORMAL LOW (ref >60)
Glucose, Bld: 83 mg/dL (ref 70–99)
Potassium: 4.5 mmol/L (ref 3.5–5.1)
Sodium: 136 mmol/L (ref 135–145)

## 2024-03-29 LAB — POCT I-STAT 7, (LYTES, BLD GAS, ICA,H+H)
Acid-base deficit: 2 mmol/L (ref 0.0–2.0)
Bicarbonate: 23 mmol/L (ref 20.0–28.0)
Calcium, Ion: 1.02 mmol/L — ABNORMAL LOW (ref 1.15–1.40)
HCT: 31 % — ABNORMAL LOW (ref 36.0–46.0)
Hemoglobin: 10.5 g/dL — ABNORMAL LOW (ref 12.0–15.0)
O2 Saturation: 95 %
Potassium: 4.3 mmol/L (ref 3.5–5.1)
Sodium: 136 mmol/L (ref 135–145)
TCO2: 24 mmol/L (ref 22–32)
pCO2 arterial: 40.8 mmHg (ref 32–48)
pH, Arterial: 7.36 (ref 7.35–7.45)
pO2, Arterial: 81 mmHg — ABNORMAL LOW (ref 83–108)

## 2024-03-29 SURGERY — CORONARY ANGIOGRAPHY (CATH LAB)
Anesthesia: LOCAL

## 2024-03-29 MED ORDER — VERAPAMIL HCL 2.5 MG/ML IV SOLN
INTRAVENOUS | Status: AC
  Filled 2024-03-29: qty 2

## 2024-03-29 MED ORDER — FENTANYL CITRATE (PF) 100 MCG/2ML IJ SOLN
INTRAMUSCULAR | Status: AC
  Filled 2024-03-29: qty 2

## 2024-03-29 MED ORDER — HYDRALAZINE HCL 20 MG/ML IJ SOLN: 10.0000 mg | INTRAMUSCULAR | Status: DC | PRN

## 2024-03-29 MED ORDER — LIDOCAINE HCL (PF) 1 % IJ SOLN
INTRAMUSCULAR | Status: DC | PRN
  Administered 2024-03-29: 2 mL
  Administered 2024-03-29: 5 mL

## 2024-03-29 MED ORDER — SODIUM CHLORIDE 0.9% FLUSH: 3.0000 mL | INTRAVENOUS | Status: DC | PRN

## 2024-03-29 MED ORDER — SODIUM CHLORIDE 0.9% FLUSH: 3.0000 mL | Freq: Two times a day (BID) | INTRAVENOUS | Status: DC

## 2024-03-29 MED ORDER — IOHEXOL 350 MG/ML SOLN
INTRAVENOUS | Status: DC | PRN
  Administered 2024-03-29: 75 mL via INTRA_ARTERIAL

## 2024-03-29 MED ORDER — HEPARIN SODIUM (PORCINE) 1000 UNIT/ML IJ SOLN
INTRAMUSCULAR | Status: AC
  Filled 2024-03-29: qty 10

## 2024-03-29 MED ORDER — FENTANYL CITRATE (PF) 100 MCG/2ML IJ SOLN
INTRAMUSCULAR | Status: DC | PRN
  Administered 2024-03-29: 25 ug via INTRAVENOUS

## 2024-03-29 MED ORDER — VERAPAMIL HCL 2.5 MG/ML IV SOLN
INTRAVENOUS | Status: DC | PRN
  Administered 2024-03-29: 10 mL via INTRA_ARTERIAL

## 2024-03-29 MED ORDER — ONDANSETRON HCL 4 MG/2ML IJ SOLN: 4.0000 mg | Freq: Four times a day (QID) | INTRAMUSCULAR | Status: DC | PRN

## 2024-03-29 MED ORDER — SODIUM CHLORIDE 0.9 % IV SOLN: INTRAVENOUS | Status: DC

## 2024-03-29 MED ORDER — HEPARIN SODIUM (PORCINE) 1000 UNIT/ML IJ SOLN
INTRAMUSCULAR | Status: DC | PRN
  Administered 2024-03-29: 5000 [IU] via INTRA_ARTERIAL

## 2024-03-29 MED ORDER — ASPIRIN 81 MG PO CHEW: 81.0000 mg | CHEWABLE_TABLET | ORAL | Status: DC

## 2024-03-29 MED ORDER — MIDAZOLAM HCL 2 MG/2ML IJ SOLN
INTRAMUSCULAR | Status: DC | PRN
  Administered 2024-03-29: 1 mg via INTRAVENOUS

## 2024-03-29 MED ORDER — LIDOCAINE HCL (PF) 1 % IJ SOLN
INTRAMUSCULAR | Status: AC
  Filled 2024-03-29: qty 30

## 2024-03-29 MED ORDER — HEPARIN (PORCINE) IN NACL 1000-0.9 UT/500ML-% IV SOLN
INTRAVENOUS | Status: DC | PRN
  Administered 2024-03-29 (×2): 500 mL via INTRA_ARTERIAL

## 2024-03-29 MED ORDER — MIDAZOLAM HCL 2 MG/2ML IJ SOLN
INTRAMUSCULAR | Status: AC
  Filled 2024-03-29: qty 2

## 2024-03-29 MED ORDER — SODIUM CHLORIDE 0.9 % IV SOLN: 250.0000 mL | INTRAVENOUS | Status: DC | PRN

## 2024-03-29 MED ORDER — ACETAMINOPHEN 325 MG PO TABS: 650.0000 mg | ORAL_TABLET | ORAL | Status: DC | PRN

## 2024-03-29 MED ORDER — LABETALOL HCL 5 MG/ML IV SOLN: 10.0000 mg | INTRAVENOUS | Status: DC | PRN

## 2024-03-29 SURGICAL SUPPLY — 14 items
CATH BALLN WEDGE 5F 110CM (CATHETERS) IMPLANT
CATH INFINITI 5FR ANG PIGTAIL (CATHETERS) IMPLANT
CATH INFINITI AMBI 6FR TG (CATHETERS) IMPLANT
DEVICE RAD COMP TR BAND LRG (VASCULAR PRODUCTS) IMPLANT
GLIDESHEATH SLEND SS 6F .021 (SHEATH) IMPLANT
GUIDEWIRE .025 260CM (WIRE) IMPLANT
GUIDEWIRE INQWIRE 1.5J.035X260 (WIRE) IMPLANT
KIT MICROPUNCTURE NIT STIFF (SHEATH) IMPLANT
PACK CARDIAC CATHETERIZATION (CUSTOM PROCEDURE TRAY) ×2 IMPLANT
SET ATX-X65L (MISCELLANEOUS) IMPLANT
SHEATH GLIDE SLENDER 4/5FR (SHEATH) IMPLANT
SHEATH PROBE COVER 6X72 (BAG) IMPLANT
WIRE EMERALD 3MM-J .035X260CM (WIRE) IMPLANT
WIRE EMERALD ST .035X260CM (WIRE) IMPLANT

## 2024-03-29 NOTE — Interval H&P Note (Signed)
 History and Physical Interval Note:  03/29/2024 6:48 AM  Rock GORMAN Senters  has presented today for surgery, with the diagnosis of aortic stenosis.  The various methods of treatment have been discussed with the patient and family. After consideration of risks, benefits and other options for treatment, the patient has consented to  Procedure(s): RIGHT/LEFT HEART CATH AND CORONARY ANGIOGRAPHY (N/A) as a surgical intervention.  The patient's history has been reviewed, patient examined, no change in status, stable for surgery.  I have reviewed the patient's chart and labs.  Questions were answered to the patient's satisfaction.     Sofia Jaquith K Pema Thomure

## 2024-03-29 NOTE — Discharge Instructions (Addendum)
 Restart Eliquis  7/16 morning Drink plenty of fluids for 48 hours and keep wrist elevated at heart level for 24 hours  Radial Site Care   This sheet gives you information about how to care for yourself after your procedure. Your health care provider may also give you more specific instructions. If you have problems or questions, contact your health care provider. What can I expect after the procedure? After the procedure, it is common to have: Bruising and tenderness at the catheter insertion area. Follow these instructions at home: Medicines Take over-the-counter and prescription medicines only as told by your health care provider. Insertion site care Follow instructions from your health care provider about how to take care of your insertion site. Make sure you: Wash your hands with soap and water before you change your bandage (dressing). If soap and water are not available, use hand sanitizer. Remove your dressing as told by your health care provider. In 24 hours Check your insertion site every day for signs of infection. Check for: Redness, swelling, or pain. Fluid or blood. Pus or a bad smell. Warmth. Do not take baths, swim, or use a hot tub for 5 days You may shower 24 hours after the procedure, or as directed by your health care provider. Remove the dressing and gently wash the site with plain soap and water. Pat the area dry with a clean towel. Do not rub the site. That could cause bleeding. Do not apply powder or lotion to the site. Activity   For 24 hours after the procedure, or as directed by your health care provider: Do not flex or bend the affected arm. Do not push or pull heavy objects with the affected arm. Do not drive yourself home from the hospital or clinic. You may drive 24 hours after the procedure unless your health care provider tells you not to. Do not operate machinery or power tools. Do not lift anything that is heavier than 10 lb (4.5 kg), or the limit  that you are told, until your health care provider says that it is safe.  For 4 days Ask your health care provider when it is okay to: Return to work or school. Resume usual physical activities or sports. Resume sexual activity. General instructions If the catheter site starts to bleed, raise your arm and put firm pressure on the site. If the bleeding does not stop, get help right away. This is a medical emergency. If you went home on the same day as your procedure, a responsible adult should be with you for the first 24 hours after you arrive home. Keep all follow-up visits as told by your health care provider. This is important. Contact a health care provider if: You have a fever. You have redness, swelling, or yellow drainage around your insertion site. Get help right away if: You have unusual pain at the radial site. The catheter insertion area swells very fast. The insertion area is bleeding, and the bleeding does not stop when you hold steady pressure on the area. Your arm or hand becomes pale, cool, tingly, or numb. These symptoms may represent a serious problem that is an emergency. Do not wait to see if the symptoms will go away. Get medical help right away. Call your local emergency services (911 in the [place] Do not drive yourself to the hospital. Summary After the procedure, it is common to have bruising and tenderness at the site. Follow instructions from your health care provider about how to take care of  your radial site wound. Check the wound every day for signs of infection. Do not lift anything that is heavier than 10 lb (4.5 kg), or the limit that you are told, until your health care provider says that it is safe. This information is not intended to replace advice given to you by your health care provider. Make sure you discuss any questions you have with your health care provider. Document Revised: 10/07/2017 Document Reviewed: 10/07/2017 Elsevier Patient Education  2020  [employer].

## 2024-04-05 ENCOUNTER — Ambulatory Visit: Payer: Self-pay | Admitting: Internal Medicine

## 2024-04-05 ENCOUNTER — Ambulatory Visit (HOSPITAL_COMMUNITY)
Admit: 2024-04-05 | Discharge: 2024-04-05 | Disposition: A | Discharge status: Home / Self Care | Source: Ambulatory Visit | Attending: Family Medicine | Admitting: Family Medicine

## 2024-04-05 DIAGNOSIS — I350 Nonrheumatic aortic (valve) stenosis: Secondary | ICD-10-CM | POA: Diagnosis present

## 2024-04-05 DIAGNOSIS — R590 Localized enlarged lymph nodes: Secondary | ICD-10-CM | POA: Insufficient documentation

## 2024-04-05 DIAGNOSIS — N261 Atrophy of kidney (terminal): Secondary | ICD-10-CM | POA: Diagnosis not present

## 2024-04-05 DIAGNOSIS — R918 Other nonspecific abnormal finding of lung field: Secondary | ICD-10-CM | POA: Insufficient documentation

## 2024-04-05 DIAGNOSIS — Z01818 Encounter for other preprocedural examination: Secondary | ICD-10-CM | POA: Diagnosis present

## 2024-04-05 DIAGNOSIS — K5730 Diverticulosis of large intestine without perforation or abscess without bleeding: Secondary | ICD-10-CM | POA: Diagnosis not present

## 2024-04-05 DIAGNOSIS — Z01810 Encounter for preprocedural cardiovascular examination: Secondary | ICD-10-CM | POA: Diagnosis not present

## 2024-04-05 DIAGNOSIS — I700 Atherosclerosis of aorta: Secondary | ICD-10-CM | POA: Diagnosis not present

## 2024-04-05 DIAGNOSIS — M4854XA Collapsed vertebra, not elsewhere classified, thoracic region, initial encounter for fracture: Secondary | ICD-10-CM | POA: Diagnosis not present

## 2024-04-05 MED ORDER — IOHEXOL 350 MG/ML SOLN
100.0000 mL | Freq: Once | INTRAVENOUS | Status: AC | PRN
  Administered 2024-04-05: 100 mL via INTRAVENOUS

## 2024-04-22 ENCOUNTER — Ambulatory Visit
Attending: Thoracic Surgery (Cardiothoracic Vascular Surgery) | Admitting: Thoracic Surgery (Cardiothoracic Vascular Surgery)

## 2024-04-22 VITALS — BP 105/65 | HR 91 | Resp 20 | Ht 60.0 in | Wt 180.0 lb

## 2024-04-22 DIAGNOSIS — I350 Nonrheumatic aortic (valve) stenosis: Secondary | ICD-10-CM | POA: Insufficient documentation

## 2024-04-22 NOTE — Progress Notes (Signed)
 [address]       [place] [address]             [phone]        Whitney Chavez Medical Record #[id number] Date of Birth: [birth year]  Referring: Wendel Lurena POUR, MD Primary Care: Cleotilde Planas, MD Primary Cardiologist:None  Chief Complaint:    Chief Complaint  Patient presents with   Aortic Stenosis    TAVR consult, review all testing    History of Present Illness:     Whitney Chavez is a 68 y.o. female presents for surgical evaluation of severe aortic stenosis.  She has a history of ESRD, and has been on dialysis for over 56yrs.  She also has developed calciphylaxis in the last 5-6 yrs and has had to have areas surgically removed due to pain.  She denies any chest pain or dizziness.  She does have some shortness of breath related to her dialysis runs.  Lately, they have been unable to pull much fluid due to blood pressure issues.         Past Medical History:  Diagnosis Date   Asthma    Axillary vein thrombosis (HCC) 08/16/2015   Calciphylaxis    Chronic kidney disease    Colitis    COPD (chronic obstructive pulmonary disease) (HCC)    Factor V Leiden (HCC)    Hypertension    Lymphedema    legs   MRSA (methicillin resistant staph aureus) culture positive    Neuropathy    Obstructive sleep apnea 03/29/2015   OSA on CPAP    Vision abnormalities     Past Surgical History:  Procedure Laterality Date   APPLICATION OF WOUND VAC     AV FISTULA PLACEMENT Left    BIOPSY  11/05/2022   Procedure: BIOPSY;  Surgeon: Burnette Fallow, MD;  Location: [facility];  Service: Gastroenterology;;   COLONOSCOPY WITH PROPOFOL  Bilateral 11/05/2022   Procedure: COLONOSCOPY WITH PROPOFOL ;  Surgeon: Burnette Fallow, MD;  Location: [facility];  Service: Gastroenterology;  Laterality: Bilateral;   CORONARY ANGIOGRAPHY N/A 03/29/2024   Procedure: CORONARY ANGIOGRAPHY;  Surgeon: Wendel Lurena POUR, MD;  Location: [facility];  Service:  Cardiovascular;  Laterality: N/A;   ESOPHAGOGASTRODUODENOSCOPY (EGD) WITH PROPOFOL  Bilateral 03/19/2022   Procedure: ESOPHAGOGASTRODUODENOSCOPY (EGD) WITH PROPOFOL ;  Surgeon: Burnette Fallow, MD;  Location: [facility];  Service: Gastroenterology;  Laterality: Bilateral;   LOWER EXTREMITY ANGIOGRAPHY  03/29/2024   Procedure: Lower Extremity Angiography;  Surgeon: Wendel Lurena POUR, MD;  Location: [facility];  Service: Cardiovascular;;   lumbar decompression fusion     TONSILLECTOMY     UPPER EXTREMITY VENOGRAPHY  03/29/2024   Procedure: UPPER EXTREMITY VENOGRAPHY;  Surgeon: Wendel Lurena POUR, MD;  Location: [facility];  Service: Cardiovascular;;    Social History:  Social History   Tobacco Use  Smoking Status Former   Current packs/day: 0.00   Average packs/day: 2.0 packs/day for 30.0 years (60.0 ttl pk-yrs)   Types: Cigarettes   Start date: 11/24/1968   Quit date: 11/25/1998   Years since quitting: 25.4  Smokeless Tobacco Never    Social History   Substance and Sexual Activity  Alcohol Use No   Alcohol/week: 0.0 standard drinks of alcohol     Allergies  Allergen Reactions   Estrogens Other (See Comments)    Blood clots   Other Other (See Comments)    Unable to take birth control pills due  to clotting disorder/fim   Prednisone Other (See Comments)    Facial edema - has had this 2020 and had no difficulty can take in small doses   Progesterone Other (See Comments)    Blood clots   Propofol  Other (See Comments)    Became disinhibited and exacerbated restless leg syndrome during EGD/colonoscopy. Patient has requested an alternate form of sedation during future procedures.  Legs started thrashing and skin tears.   Lisinopril Cough   Clotrimazole Rash   Tape Rash and Other (See Comments)    Please use paper tape      Current Outpatient Medications  Medication Sig Dispense Refill   acetaminophen  (TYLENOL ) 500 MG tablet Take 500 mg by mouth every 6 (six)  hours as needed for moderate pain.     amiodarone (PACERONE) 200 MG tablet Take 200 mg by mouth in the morning.     apixaban  (ELIQUIS ) 5 MG TABS tablet Take 1 tablet (5 mg total) by mouth 2 (two) times daily. 60 tablet 0   atorvastatin (LIPITOR) 20 MG tablet Take 20 mg by mouth every evening.     B Complex-C-Folic Acid (RENAL VITAMIN PO) Take 1 tablet by mouth daily.     BREYNA  160-4.5 MCG/ACT inhaler Inhale 2 puffs into the lungs 2 (two) times daily. 10.3 g 11   cetirizine (ZYRTEC) 10 MG tablet Take 10 mg by mouth in the morning.     Elastic Bandages & Supports (MEDICAL COMPRESSION STOCKINGS) MISC by Does not apply route. Leg wraps.     epoetin alfa (EPOGEN) 2000 UNIT/ML injection 2,000 Units 3 (three) times a week.     Etelcalcetide HCl (PARSABIV IV) Inject 1 Dose into the vein 3 (three) times a week. Receives at Dialysis     fluocinonide ointment (LIDEX) 0.05 % Apply 1 Application topically 2 (two) times daily as needed (psoriasis).     gabapentin  (NEURONTIN ) 100 MG capsule Take 1 capsule (100 mg total) by mouth 4 (four) times daily. (Patient taking differently: Take 100-200 mg by mouth See admin instructions. Take 100 mg in the afternoon and 200 mg at bedtime may take a second 100 mg dose as needed for pain) 360 capsule 3   heparin  5000 UNIT/ML injection Inject 1 mL (5,000 Units total) into the skin 2 (two) times daily. To be given subcutaneously for 3 days prior to surgery 6 mL 0   lanthanum  (FOSRENOL ) 1000 MG chewable tablet Chew 500-2,000 mg by mouth See admin instructions. Take 1000 to 2000 mg with each meal and 500 mg with each snack     neomycin-bacitracin-polymyxin (NEOSPORIN) OINT Apply 1 Application topically as needed for wound care.     ondansetron  (ZOFRAN -ODT) 4 MG disintegrating tablet Take 4 mg by mouth every Monday, Wednesday, and Friday.     oxyCODONE  (ROXICODONE ) 5 MG immediate release tablet Take one po qd formore severe pain 30 tablet 0   PRESCRIPTION MEDICATION Inhale into  the lungs at bedtime. CPAP with nasal pillow     rOPINIRole  (REQUIP ) 0.5 MG tablet TAKE 1-2 TABLETS BY MOUTH IN THE MORNING. (Patient taking differently: Take 0.5 mg by mouth daily in the afternoon.) 180 tablet 4   rOPINIRole  (REQUIP ) 2 MG tablet Take 1 tablet (2 mg total) by mouth at bedtime. 90 tablet 3   SODIUM THIOSULFATE IV Inject 1 Dose into the vein 2 (two) times a week. Receives at Dialysis     sulfaSALAzine  (AZULFIDINE ) 500 MG tablet Take 1,000 mg by mouth 2 (two) times daily.  traMADol  (ULTRAM ) 50 MG tablet TAKE 1 TABLET (50 MG TOTAL) BY MOUTH 2 (TWO) TIMES DAILY AS NEEDED (PAIN). (Patient taking differently: Take 50 mg by mouth 2 (two) times daily.) 60 tablet 5   triamcinolone cream (KENALOG) 0.1 % Apply 1 Application topically daily as needed (itching).     umeclidinium bromide  (INCRUSE ELLIPTA ) 62.5 MCG/ACT AEPB Inhale 1 puff into the lungs daily. 90 each 3   No current facility-administered medications for this visit.    (Not in a hospital admission)   Family History  Problem Relation Age of Onset   Diabetes Mother    Congestive Heart Failure Mother    COPD Mother    Stroke Mother    Neuropathy Mother    Heart disease Father    Stroke Father    Cancer Brother    Diabetes Brother    Diabetes Brother    Alcohol abuse Brother      Review of Systems:   Review of Systems  Constitutional:  Positive for malaise/fatigue.  Respiratory:  Positive for shortness of breath.   Cardiovascular:  Negative for palpitations.  Neurological: Negative.       Physical Exam: BP 105/65   Pulse 91   Resp 20   Ht 5' (1.524 m)   Wt 180 lb (81.6 kg)   SpO2 97% Comment: RA  BMI 35.15 kg/m  Physical Exam Constitutional:      General: She is not in acute distress.    Appearance: She is not ill-appearing.  HENT:     Head: Normocephalic and atraumatic.  Eyes:     Extraocular Movements: Extraocular movements intact.  Cardiovascular:     Rate and Rhythm: Normal rate.   Pulmonary:     Effort: Pulmonary effort is normal. No respiratory distress.  Chest:       Comments: Large superficial veins along the chest Abdominal:     General: Abdomen is flat. There is no distension.  Musculoskeletal:     Cervical back: Normal range of motion.     Comments: Uses a walker.  Skin:    General: Skin is warm and dry.  Neurological:     General: No focal deficit present.     Mental Status: She is alert and oriented to person, place, and time.       Cardiac Studies & Procedures   ______________________________________________________________________________________________ CARDIAC CATHETERIZATION  CARDIAC CATHETERIZATION 03/29/2024  Conclusion 1.  Minimal, nonobstructive coronary artery disease. 2.  Right heart catheterization was deferred due to occluded venous system of the right upper extremity and right internal jugular systems.  Left AC approach was not pursued given left upper extremity fistula.  A right femoral approach was not pursued given history of restless legs. 3.  Capacious iliofemoral vessels bilaterally.  Recommendation: Continue evaluation for aortic valve intervention.  Resume Eliquis  tomorrow.  Findings Coronary Findings Diagnostic  Dominance: Right  Left Anterior Descending The vessel exhibits minimal luminal irregularities.  Left Circumflex The vessel exhibits minimal luminal irregularities.  Right Coronary Artery The vessel exhibits minimal luminal irregularities.  Intervention  No interventions have been documented.     ECHOCARDIOGRAM  ECHOCARDIOGRAM COMPLETE 12/02/2018  Narrative ECHOCARDIOGRAM REPORT    Patient Name:   LEIGHTYN CINA Date of Exam: 12/02/2018 Medical Rec #:  [id number]     Height:       61.0 in Accession #:    [id number]    Weight:       247.0 lb Date of Birth:  Dec 29, 1955  BSA:          2.07 m Patient Age:    62 years      BP:           176/88 mmHg Patient Gender: F             HR:           83  bpm. Exam Location:  [address]   Procedure: 2D Echo, Cardiac Doppler and Color Doppler  Indications:    R06.02 SOB  History:        Patient has no prior history of Echocardiogram examinations. Abnormal ECG; Aortic Valve Disease.  Sonographer:    Waldo Guadalajara RCS Referring Phys: [id number] CHI JANE ELLISON  IMPRESSIONS   1. The left ventricle has low normal systolic function, with an ejection fraction of 50-55%. The cavity size was normal. There is moderate concentric left ventricular hypertrophy. Left ventricular diastolic Doppler parameters are consistent with pseudonormalization. The E/e' is 5.6. There is Septal bounce is present. No evidence of left ventricular regional wall motion abnormalities. 2. The right ventricle has normal systolic function. The cavity was mildly enlarged. There is no increase in right ventricular wall thickness. Right ventricular systolic pressure is mildly elevated with an estimated pressure of 44.8 mmHg. 3. Left atrial size was moderately dilated. 4. Right atrial size was mildly dilated. 5. Mild thickening of the mitral valve leaflet. There is moderate mitral annular calcification present. 6. The aortic valve has an indeterminate number of cusps Mild thickening of the aortic valve Moderate calcification of the aortic valve. moderate stenosis of the aortic valve. 7. Possibly functional bicuspid aortic valve due to calcification of the left and non coronary commissure. 8. The inferior vena cava was dilated in size with <50% respiratory variability.  SUMMARY  Low normal LV EF without focal abnormalities. Grade 2 diastolic dysfunction with mildly elevated RVSP. Aortic valve is calcified and restricted, possibly functionally bicuspid, with moderate stenosis. FINDINGS Left Ventricle: The left ventricle has low normal systolic function, with an ejection fraction of 50-55%. The cavity size was normal. There is moderate concentric left ventricular hypertrophy.  Left ventricular diastolic Doppler parameters are consistent with pseudonormalization. The E/e' is 29.6. There is Septal bounce is present. No evidence of left ventricular regional wall motion abnormalities.. Right Ventricle: The right ventricle has normal systolic function. The cavity was mildly enlarged. There is no increase in right ventricular wall thickness. Right ventricular systolic pressure is mildly elevated with an estimated pressure of 44.8 mmHg. Left Atrium: left atrial size was moderately dilated Right Atrium: right atrial size was mildly dilated. Right atrial pressure is estimated at 15 mmHg. Interatrial Septum: No atrial level shunt detected by color flow Doppler. Pericardium: There is no evidence of pericardial effusion. Mitral Valve: The mitral valve is normal in structure. Mild thickening of the mitral valve leaflet. There is moderate mitral annular calcification present. Mitral valve regurgitation is mild by color flow Doppler. Tricuspid Valve: The tricuspid valve is normal in structure. Tricuspid valve regurgitation is mild by color flow Doppler. Aortic Valve: The aortic valve has an indeterminate number of cusps Mild thickening of the aortic valve Moderate calcification of the aortic valve, with mildly decreased cusp excursion. Aortic valve regurgitation was not visualized by color flow Doppler. There is moderate stenosis of the aortic valve, with a calculated valve area of 1.21 cm. Possibly functional bicuspid aortic valve due to calcification of the left and non coronary commissure. Pulmonic Valve: The pulmonic valve was grossly normal.  Pulmonic valve regurgitation is not visualized by color flow Doppler. No evidence of pulmonic stenosis. Pulmonary Artery: The pulmonary artery is not well seen. Venous: The inferior vena cava is dilated in size with less than 50% respiratory variability.  LEFT VENTRICLE PLAX 2D LVIDd:         6.22 cm  Diastology LVIDs:         4.55 cm  LV e'  lateral:   8.49 cm/s LV PW:         1.76 cm  LV E/e' lateral: 15.1 LV IVS:        1.70 cm  LV e' medial:    5.77 cm/s LVOT diam:     2.10 cm  LV E/e' medial:  22.2 LV SV:         101 ml LV SV Index:   44.37 LVOT Area:     3.46 cm  RIGHT VENTRICLE RV Basal diam:  4.90 cm RV S prime:     11.20 cm/s TAPSE (M-mode): 2.4 cm RVSP:           44.8 mmHg  LEFT ATRIUM             Index       RIGHT ATRIUM           Index LA diam:        4.80 cm 2.32 cm/m  RA Pressure: 15 mmHg LA Vol (A2C):   99.1 ml 47.97 ml/m RA Area:     21.40 cm LA Vol (A4C):   87.2 ml 42.21 ml/m RA Volume:   66.20 ml  32.04 ml/m LA Biplane Vol: 95.6 ml 46.27 ml/m AORTIC VALVE AV Area (Vmax):    1.06 cm AV Area (Vmean):   0.95 cm AV Area (VTI):     1.21 cm AV Vmax:           330.00 cm/s AV Vmean:          221.000 cm/s AV VTI:            0.695 m AV Peak Grad:      43.6 mmHg AV Mean Grad:      24.0 mmHg LVOT Vmax:         101.00 cm/s LVOT Vmean:        60.700 cm/s LVOT VTI:          0.243 m LVOT/AV VTI ratio: 0.35  AORTA Ao Root diam: 3.00 cm  MITRAL VALVE               TRICUSPID VALVE TR Peak grad:   29.8 mmHg TR Vmax:        311.00 cm/s MV Decel Time: 148 msec    RVSP:           44.8 mmHg MV E velocity: 128.00 cm/s MV A velocity: 106.00 cm/s SHUNTS MV E/A ratio:  1.21        Systemic VTI:  0.24 m Systemic Diam: 2.10 cm   Shelda Bruckner MD Electronically signed by Shelda Bruckner MD Signature Date/Time: 12/02/2018/10:26:37 PM    Final      CT SCANS  CT CORONARY MORPH W/CTA COR W/SCORE 04/05/2024  Addendum 04/05/2024  5:21 PM ADDENDUM REPORT: 04/05/2024 17:18  ADDENDUM: The following report is an over-read performed by radiologist Dr. Reyes Holder of [facility] on 04/05/2024. This over-read does not include interpretation of cardiac or coronary anatomy or pathology. The coronary calcium score/coronary CTA interpretation by the cardiologist is  attached.  COMPARISON:  None.  FINDINGS: Vascular: Aortic atherosclerosis. Calcifications of the aortic valve and annulus. Three-vessel coronary artery calcifications. Mild four-chamber cardiac enlargement.  Mediastinum/Nodes: Prominent mediastinal lymph nodes for instance a 7 mm subcarinal lymph node on image 19/306.  Lungs/Pleura: Scattered tiny pulmonary nodules for instance a 3 mm nodule in the right middle lobe on image 31/307. Scattered atelectasis/scarring.  Upper Abdomen: Visualized portions of the upper abdomen are unremarkable.  Musculoskeletal: There are no aggressive appearing lytic or blastic lesions noted in the visualized portions of the skeleton.  IMPRESSION: 1. Scattered tiny pulmonary nodules measuring up to 3 mm. 2. Prominent mediastinal lymph nodes, nonspecific but likely reactive. 3. Aortic atherosclerosis.  Aortic Atherosclerosis (ICD10-I70.0).   Electronically Signed By: Reyes Holder M.D. On: 04/05/2024 17:18  Narrative CLINICAL DATA:  Aortic valve replacement (TAVR), pre-op eval  EXAM: Cardiac TAVR CT  TECHNIQUE: A non-contrast, gated CT scan was obtained with axial slices of 2.5 mm through the heart for aortic valve scoring. A 120 kV retrospective, gated, contrast cardiac scan was obtained. Gantry rotation speed was 230 msec and collimation was 0.63 mm. Nitroglycerin was not given. A delayed scan was obtained to exclude left atrial appendage thrombus. The 3D dataset was reconstructed in systole with motion correction. The 3D data set was reconstructed in 5% intervals of the 0-95% of the R-R cycle. Systolic and diastolic phases were analyzed on a dedicated workstation using MPR, MIP, and VRT modes. The patient received 100 cc of contrast.  FINDINGS: Poor quality contrast bolus due to IV access.  Aortic Valve:  Tricuspid aortic valve with severely reduced cusp excursion. Severely thickened and severely calcified aortic valve  cusps.  AV calcium score: 1113  Virtual Basal Annulus Measurements:  Maximum/Minimum Diameter: 24.8 x 20.6 mm  Perimeter: 72.4 mm  Area:  405 mm2  Trivial LVOT calcifications.  Membranous septal length: 8 mm  Based on these measurements, the annulus would be suitable for a 23 mm Sapien 3 valve. Alternatively, Heart Team can consider 26 mm Evolut valve (perimeter is borderline for 26 mm vs 29 mm, and sinus size may be more suitable for a 26 mm Evolut valve). Recommend Heart Team discussion for valve selection.  Sinus of Valsalva Measurements:  Non-coronary:  29 mm  Right - coronary:  27 mm  Left - coronary:  27 mm  Coronary height and sinus of Valsalva Height:  Left main: 15 mm, Left sinus: 18 mm  Right coronary: 17 mm, Right sinus: 21 mm  Aorta: Conventional 3 vessel branch pattern of aortic arch.  Sinotubular Junction:  24 mm  Ascending Thoracic Aorta:  30 mm  Aortic Arch:  23 mm  Descending Thoracic Aorta:  23 mm  Coronary Arteries: Normal coronary origin. Right dominance. The study was performed without use of NTG and insufficient for plaque evaluation. Coronary artery calcium seen in 3 vessel distribution.  Optimum Fluoroscopic Angle for Delivery: LAO 3 CAU 3  OTHER:  Atria: Left atrial dilation  Left atrial appendage: No thrombus.  Mitral valve: Grossly normal, mild mitral annular calcifications.  Pulmonary artery: Moderately dilated, 36 mm, suggestive of elevated pulmonary pressures.  Pulmonary veins: Normal anatomy.  IMPRESSION: 1. Tricuspid aortic valve with severely reduced cusp excursion. Severely thickened and severely calcified aortic valve cusps. 2. Aortic valve calcium score: 1113 3. Annulus area: 405 mm2, suitable for 23 mm Sapien 3 valve. Trivial LVOT calcifications. Membranous septal length 8 mm. 4. Sufficient coronary artery heights from annulus. 5. Optimum fluoroscopic angle for delivery: LAO 3 CAU  3  Electronically  Signed: By: Soyla Merck M.D. On: 04/05/2024 17:13     ______________________________________________________________________________________________      ECG sinus    I have independently reviewed the above radiologic studies and discussed with the patient   Recent Lab Findings: Lab Results  Component Value Date   WBC 6.4 03/01/2024   HGB 10.5 (L) 03/29/2024   HCT 31.0 (L) 03/29/2024   PLT 151 03/01/2024   GLUCOSE 83 03/29/2024   ALT 11 03/01/2024   AST 12 (L) 03/01/2024   NA 136 03/29/2024   K 4.3 03/29/2024   CL 96 (L) 03/29/2024   CREATININE 4.53 (H) 03/29/2024   BUN 24 (H) 03/29/2024   CO2 23 03/29/2024   INR 2.78 10/26/2017      Assessment / Plan:   68 y.o. female with severe aortic stenosis.  STS score: 6.08.  NYHA Class II.  The risks and benefits of transfemoral TAVR vs SAVR were discussed in detail.  Although she is young, due to her ESRD and calciphylaxis, she would be very high risk for surgery.  We also discussed possibility of an emergent sternotomy to address any procedural complications.  Based on our discussion, we collectively decided that an emergent sternotomy would not be indicated.  The patient is agreeable to proceed.  Based on my review of her LHC, echo, and CTA, I agree with the multidisciplinary plan to proceed with a Evolut transfemoral TAVR.      I  spent 40 minutes counseling the patient face to face.   Linnie MALVA Rayas 04/22/2024 12:12 PM

## 2024-04-22 NOTE — H&P (View-Only) (Signed)
 [address]       [place] [address]             [phone]        Whitney CONSALVO [facility] Medical Record #[id number] Date of Birth: [birth year]  Referring: Whitney Lurena POUR, MD Primary Care: Whitney Planas, MD Primary Cardiologist:None  Chief Complaint:    Chief Complaint  Patient presents with   Aortic Stenosis    TAVR consult, review all testing    History of Present Illness:     Whitney Chavez is a 68 y.o. female presents for surgical evaluation of severe aortic stenosis.  She has a history of ESRD, and has been on dialysis for over 56yrs.  She also has developed calciphylaxis in the last 5-6 yrs and has had to have areas surgically removed due to pain.  She denies any chest pain or dizziness.  She does have some shortness of breath related to her dialysis runs.  Lately, they have been unable to pull much fluid due to blood pressure issues.         Past Medical History:  Diagnosis Date   Asthma    Axillary vein thrombosis (HCC) 08/16/2015   Calciphylaxis    Chronic kidney disease    Colitis    COPD (chronic obstructive pulmonary disease) (HCC)    Factor V Leiden (HCC)    Hypertension    Lymphedema    legs   MRSA (methicillin resistant staph aureus) culture positive    Neuropathy    Obstructive sleep apnea 03/29/2015   OSA on CPAP    Vision abnormalities     Past Surgical History:  Procedure Laterality Date   APPLICATION OF WOUND VAC     AV FISTULA PLACEMENT Left    BIOPSY  11/05/2022   Procedure: BIOPSY;  Surgeon: Whitney Fallow, MD;  Location: [facility];  Service: Gastroenterology;;   COLONOSCOPY WITH PROPOFOL  Bilateral 11/05/2022   Procedure: COLONOSCOPY WITH PROPOFOL ;  Surgeon: Whitney Fallow, MD;  Location: [facility];  Service: Gastroenterology;  Laterality: Bilateral;   CORONARY ANGIOGRAPHY N/A 03/29/2024   Procedure: CORONARY ANGIOGRAPHY;  Surgeon: Whitney Lurena POUR, MD;  Location: [facility] CV [facility];  Service:  Cardiovascular;  Laterality: N/A;   ESOPHAGOGASTRODUODENOSCOPY (EGD) WITH PROPOFOL  Bilateral 03/19/2022   Procedure: ESOPHAGOGASTRODUODENOSCOPY (EGD) WITH PROPOFOL ;  Surgeon: Whitney Fallow, MD;  Location: [facility];  Service: Gastroenterology;  Laterality: Bilateral;   LOWER EXTREMITY ANGIOGRAPHY  03/29/2024   Procedure: Lower Extremity Angiography;  Surgeon: Whitney Lurena POUR, MD;  Location: [facility] INVASIVE CV LAB;  Service: Cardiovascular;;   lumbar decompression fusion     TONSILLECTOMY     UPPER EXTREMITY VENOGRAPHY  03/29/2024   Procedure: UPPER EXTREMITY VENOGRAPHY;  Surgeon: Whitney Lurena POUR, MD;  Location: [facility] CV [facility];  Service: Cardiovascular;;    Social History:  Social History   Tobacco Use  Smoking Status Former   Current packs/day: 0.00   Average packs/day: 2.0 packs/day for 30.0 years (60.0 ttl pk-yrs)   Types: Cigarettes   Start date: 11/24/1968   Quit date: 11/25/1998   Years since quitting: 25.4  Smokeless Tobacco Never    Social History   Substance and Sexual Activity  Alcohol Use No   Alcohol/week: 0.0 standard drinks of alcohol     Allergies  Allergen Reactions   Estrogens Other (See Comments)    Blood clots   Other Other (See Comments)    Unable to take birth control pills due  to clotting disorder/fim   Prednisone Other (See Comments)    Facial edema - has had this 2020 and had no difficulty can take in small doses   Progesterone Other (See Comments)    Blood clots   Propofol  Other (See Comments)    Became disinhibited and exacerbated restless leg syndrome during EGD/colonoscopy. Patient has requested an alternate form of sedation during future procedures.  Legs started thrashing and skin tears.   Lisinopril Cough   Clotrimazole Rash   Tape Rash and Other (See Comments)    Please use paper tape      Current Outpatient Medications  Medication Sig Dispense Refill   acetaminophen  (TYLENOL ) 500 MG tablet Take 500 mg by mouth every 6 (six)  hours as needed for moderate pain.     amiodarone (PACERONE) 200 MG tablet Take 200 mg by mouth in the morning.     apixaban  (ELIQUIS ) 5 MG TABS tablet Take 1 tablet (5 mg total) by mouth 2 (two) times daily. 60 tablet 0   atorvastatin (LIPITOR) 20 MG tablet Take 20 mg by mouth every evening.     B Complex-C-Folic Acid (RENAL VITAMIN PO) Take 1 tablet by mouth daily.     BREYNA  160-4.5 MCG/ACT inhaler Inhale 2 puffs into the lungs 2 (two) times daily. 10.3 g 11   cetirizine (ZYRTEC) 10 MG tablet Take 10 mg by mouth in the morning.     Elastic Bandages & Supports (MEDICAL COMPRESSION STOCKINGS) MISC by Does not apply route. Leg wraps.     epoetin alfa (EPOGEN) 2000 UNIT/ML injection 2,000 Units 3 (three) times a week.     Etelcalcetide HCl (PARSABIV IV) Inject 1 Dose into the vein 3 (three) times a week. Receives at Dialysis     fluocinonide ointment (LIDEX) 0.05 % Apply 1 Application topically 2 (two) times daily as needed (psoriasis).     gabapentin  (NEURONTIN ) 100 MG capsule Take 1 capsule (100 mg total) by mouth 4 (four) times daily. (Patient taking differently: Take 100-200 mg by mouth See admin instructions. Take 100 mg in the afternoon and 200 mg at bedtime may take a second 100 mg dose as needed for pain) 360 capsule 3   heparin  5000 UNIT/ML injection Inject 1 mL (5,000 Units total) into the skin 2 (two) times daily. To be given subcutaneously for 3 days prior to surgery 6 mL 0   lanthanum  (FOSRENOL ) 1000 MG chewable tablet Chew 500-2,000 mg by mouth See admin instructions. Take 1000 to 2000 mg with each meal and 500 mg with each snack     neomycin-bacitracin-polymyxin (NEOSPORIN) OINT Apply 1 Application topically as needed for wound care.     ondansetron  (ZOFRAN -ODT) 4 MG disintegrating tablet Take 4 mg by mouth every Monday, Wednesday, and Friday.     oxyCODONE  (ROXICODONE ) 5 MG immediate release tablet Take one po qd formore severe pain 30 tablet 0   PRESCRIPTION MEDICATION Inhale into  the lungs at bedtime. CPAP with nasal pillow     rOPINIRole  (REQUIP ) 0.5 MG tablet TAKE 1-2 TABLETS BY MOUTH IN THE MORNING. (Patient taking differently: Take 0.5 mg by mouth daily in the afternoon.) 180 tablet 4   rOPINIRole  (REQUIP ) 2 MG tablet Take 1 tablet (2 mg total) by mouth at bedtime. 90 tablet 3   SODIUM THIOSULFATE IV Inject 1 Dose into the vein 2 (two) times a week. Receives at Dialysis     sulfaSALAzine  (AZULFIDINE ) 500 MG tablet Take 1,000 mg by mouth 2 (two) times daily.  traMADol  (ULTRAM ) 50 MG tablet TAKE 1 TABLET (50 MG TOTAL) BY MOUTH 2 (TWO) TIMES DAILY AS NEEDED (PAIN). (Patient taking differently: Take 50 mg by mouth 2 (two) times daily.) 60 tablet 5   triamcinolone cream (KENALOG) 0.1 % Apply 1 Application topically daily as needed (itching).     umeclidinium bromide  (INCRUSE ELLIPTA ) 62.5 MCG/ACT AEPB Inhale 1 puff into the lungs daily. 90 each 3   No current facility-administered medications for this visit.    (Not in a hospital admission)   Family History  Problem Relation Age of Onset   Diabetes Mother    Congestive Heart Failure Mother    COPD Mother    Stroke Mother    Neuropathy Mother    Heart disease Father    Stroke Father    Cancer Brother    Diabetes Brother    Diabetes Brother    Alcohol abuse Brother      Review of Systems:   Review of Systems  Constitutional:  Positive for malaise/fatigue.  Respiratory:  Positive for shortness of breath.   Cardiovascular:  Negative for palpitations.  Neurological: Negative.       Physical Exam: BP 105/65   Pulse 91   Resp 20   Ht 5' (1.524 m)   Wt 180 lb (81.6 kg)   SpO2 97% Comment: RA  BMI 35.15 kg/m  Physical Exam Constitutional:      General: She is not in acute distress.    Appearance: She is not ill-appearing.  HENT:     Head: Normocephalic and atraumatic.  Eyes:     Extraocular Movements: Extraocular movements intact.  Cardiovascular:     Rate and Rhythm: Normal rate.   Pulmonary:     Effort: Pulmonary effort is normal. No respiratory distress.  Chest:       Comments: Large superficial veins along the chest Abdominal:     General: Abdomen is flat. There is no distension.  Musculoskeletal:     Cervical back: Normal range of motion.     Comments: Uses a walker.  Skin:    General: Skin is warm and dry.  Neurological:     General: No focal deficit present.     Mental Status: She is alert and oriented to person, place, and time.       Cardiac Studies & Procedures   ______________________________________________________________________________________________ CARDIAC CATHETERIZATION  CARDIAC CATHETERIZATION 03/29/2024  Conclusion 1.  Minimal, nonobstructive coronary artery disease. 2.  Right heart catheterization was deferred due to occluded venous system of the right upper extremity and right internal jugular systems.  Left AC approach was not pursued given left upper extremity fistula.  A right femoral approach was not pursued given history of restless legs. 3.  Capacious iliofemoral vessels bilaterally.  Recommendation: Continue evaluation for aortic valve intervention.  Resume Eliquis  tomorrow.  Findings Coronary Findings Diagnostic  Dominance: Right  Left Anterior Descending The vessel exhibits minimal luminal irregularities.  Left Circumflex The vessel exhibits minimal luminal irregularities.  Right Coronary Artery The vessel exhibits minimal luminal irregularities.  Intervention  No interventions have been documented.     ECHOCARDIOGRAM  ECHOCARDIOGRAM COMPLETE 12/02/2018  Narrative ECHOCARDIOGRAM REPORT    Patient Name:   Whitney Chavez Date of Exam: 12/02/2018 Medical Rec #:  [id number]     Height:       61.0 in Accession #:    [id number]    Weight:       247.0 lb Date of Birth:  Dec 29, 1955  BSA:          2.07 m Patient Age:    62 years      BP:           176/88 mmHg Patient Gender: F             HR:           83  bpm. Exam Location:  [address]   Procedure: 2D Echo, Cardiac Doppler and Color Doppler  Indications:    R06.02 SOB  History:        Patient has no prior history of Echocardiogram examinations. Abnormal ECG; Aortic Valve Disease.  Sonographer:    Waldo Guadalajara RCS Referring Phys: [id number] CHI JANE ELLISON  IMPRESSIONS   1. The left ventricle has low normal systolic function, with an ejection fraction of 50-55%. The cavity size was normal. There is moderate concentric left ventricular hypertrophy. Left ventricular diastolic Doppler parameters are consistent with pseudonormalization. The E/e' is 5.6. There is Septal bounce is present. No evidence of left ventricular regional wall motion abnormalities. 2. The right ventricle has normal systolic function. The cavity was mildly enlarged. There is no increase in right ventricular wall thickness. Right ventricular systolic pressure is mildly elevated with an estimated pressure of 44.8 mmHg. 3. Left atrial size was moderately dilated. 4. Right atrial size was mildly dilated. 5. Mild thickening of the mitral valve leaflet. There is moderate mitral annular calcification present. 6. The aortic valve has an indeterminate number of cusps Mild thickening of the aortic valve Moderate calcification of the aortic valve. moderate stenosis of the aortic valve. 7. Possibly functional bicuspid aortic valve due to calcification of the left and non coronary commissure. 8. The inferior vena cava was dilated in size with <50% respiratory variability.  SUMMARY  Low normal LV EF without focal abnormalities. Grade 2 diastolic dysfunction with mildly elevated RVSP. Aortic valve is calcified and restricted, possibly functionally bicuspid, with moderate stenosis. FINDINGS Left Ventricle: The left ventricle has low normal systolic function, with an ejection fraction of 50-55%. The cavity size was normal. There is moderate concentric left ventricular hypertrophy.  Left ventricular diastolic Doppler parameters are consistent with pseudonormalization. The E/e' is 29.6. There is Septal bounce is present. No evidence of left ventricular regional wall motion abnormalities.. Right Ventricle: The right ventricle has normal systolic function. The cavity was mildly enlarged. There is no increase in right ventricular wall thickness. Right ventricular systolic pressure is mildly elevated with an estimated pressure of 44.8 mmHg. Left Atrium: left atrial size was moderately dilated Right Atrium: right atrial size was mildly dilated. Right atrial pressure is estimated at 15 mmHg. Interatrial Septum: No atrial level shunt detected by color flow Doppler. Pericardium: There is no evidence of pericardial effusion. Mitral Valve: The mitral valve is normal in structure. Mild thickening of the mitral valve leaflet. There is moderate mitral annular calcification present. Mitral valve regurgitation is mild by color flow Doppler. Tricuspid Valve: The tricuspid valve is normal in structure. Tricuspid valve regurgitation is mild by color flow Doppler. Aortic Valve: The aortic valve has an indeterminate number of cusps Mild thickening of the aortic valve Moderate calcification of the aortic valve, with mildly decreased cusp excursion. Aortic valve regurgitation was not visualized by color flow Doppler. There is moderate stenosis of the aortic valve, with a calculated valve area of 1.21 cm. Possibly functional bicuspid aortic valve due to calcification of the left and non coronary commissure. Pulmonic Valve: The pulmonic valve was grossly normal.  Pulmonic valve regurgitation is not visualized by color flow Doppler. No evidence of pulmonic stenosis. Pulmonary Artery: The pulmonary artery is not well seen. Venous: The inferior vena cava is dilated in size with less than 50% respiratory variability.  LEFT VENTRICLE PLAX 2D LVIDd:         6.22 cm  Diastology LVIDs:         4.55 cm  LV e'  lateral:   8.49 cm/s LV PW:         1.76 cm  LV E/e' lateral: 15.1 LV IVS:        1.70 cm  LV e' medial:    5.77 cm/s LVOT diam:     2.10 cm  LV E/e' medial:  22.2 LV SV:         101 ml LV SV Index:   44.37 LVOT Area:     3.46 cm  RIGHT VENTRICLE RV Basal diam:  4.90 cm RV S prime:     11.20 cm/s TAPSE (M-mode): 2.4 cm RVSP:           44.8 mmHg  LEFT ATRIUM             Index       RIGHT ATRIUM           Index LA diam:        4.80 cm 2.32 cm/m  RA Pressure: 15 mmHg LA Vol (A2C):   99.1 ml 47.97 ml/m RA Area:     21.40 cm LA Vol (A4C):   87.2 ml 42.21 ml/m RA Volume:   66.20 ml  32.04 ml/m LA Biplane Vol: 95.6 ml 46.27 ml/m AORTIC VALVE AV Area (Vmax):    1.06 cm AV Area (Vmean):   0.95 cm AV Area (VTI):     1.21 cm AV Vmax:           330.00 cm/s AV Vmean:          221.000 cm/s AV VTI:            0.695 m AV Peak Grad:      43.6 mmHg AV Mean Grad:      24.0 mmHg LVOT Vmax:         101.00 cm/s LVOT Vmean:        60.700 cm/s LVOT VTI:          0.243 m LVOT/AV VTI ratio: 0.35  AORTA Ao Root diam: 3.00 cm  MITRAL VALVE               TRICUSPID VALVE TR Peak grad:   29.8 mmHg TR Vmax:        311.00 cm/s MV Decel Time: 148 msec    RVSP:           44.8 mmHg MV E velocity: 128.00 cm/s MV A velocity: 106.00 cm/s SHUNTS MV E/A ratio:  1.21        Systemic VTI:  0.24 m Systemic Diam: 2.10 cm   Shelda Bruckner MD Electronically signed by Shelda Bruckner MD Signature Date/Time: 12/02/2018/10:26:37 PM    Final      CT SCANS  CT CORONARY MORPH W/CTA COR W/SCORE 04/05/2024  Addendum 04/05/2024  5:21 PM ADDENDUM REPORT: 04/05/2024 17:18  ADDENDUM: The following report is an over-read performed by radiologist Dr. Reyes Holder of [facility] on 04/05/2024. This over-read does not include interpretation of cardiac or coronary anatomy or pathology. The coronary calcium score/coronary CTA interpretation by the cardiologist is  attached.  COMPARISON:  None.  FINDINGS: Vascular: Aortic atherosclerosis. Calcifications of the aortic valve and annulus. Three-vessel coronary artery calcifications. Mild four-chamber cardiac enlargement.  Mediastinum/Nodes: Prominent mediastinal lymph nodes for instance a 7 mm subcarinal lymph node on image 19/306.  Lungs/Pleura: Scattered tiny pulmonary nodules for instance a 3 mm nodule in the right middle lobe on image 31/307. Scattered atelectasis/scarring.  Upper Abdomen: Visualized portions of the upper abdomen are unremarkable.  Musculoskeletal: There are no aggressive appearing lytic or blastic lesions noted in the visualized portions of the skeleton.  IMPRESSION: 1. Scattered tiny pulmonary nodules measuring up to 3 mm. 2. Prominent mediastinal lymph nodes, nonspecific but likely reactive. 3. Aortic atherosclerosis.  Aortic Atherosclerosis (ICD10-I70.0).   Electronically Signed By: Reyes Holder M.D. On: 04/05/2024 17:18  Narrative CLINICAL DATA:  Aortic valve replacement (TAVR), pre-op eval  EXAM: Cardiac TAVR CT  TECHNIQUE: A non-contrast, gated CT scan was obtained with axial slices of 2.5 mm through the heart for aortic valve scoring. A 120 kV retrospective, gated, contrast cardiac scan was obtained. Gantry rotation speed was 230 msec and collimation was 0.63 mm. Nitroglycerin was not given. A delayed scan was obtained to exclude left atrial appendage thrombus. The 3D dataset was reconstructed in systole with motion correction. The 3D data set was reconstructed in 5% intervals of the 0-95% of the R-R cycle. Systolic and diastolic phases were analyzed on a dedicated workstation using MPR, MIP, and VRT modes. The patient received 100 cc of contrast.  FINDINGS: Poor quality contrast bolus due to IV access.  Aortic Valve:  Tricuspid aortic valve with severely reduced cusp excursion. Severely thickened and severely calcified aortic valve  cusps.  AV calcium score: 1113  Virtual Basal Annulus Measurements:  Maximum/Minimum Diameter: 24.8 x 20.6 mm  Perimeter: 72.4 mm  Area:  405 mm2  Trivial LVOT calcifications.  Membranous septal length: 8 mm  Based on these measurements, the annulus would be suitable for a 23 mm Sapien 3 valve. Alternatively, Heart Team can consider 26 mm Evolut valve (perimeter is borderline for 26 mm vs 29 mm, and sinus size may be more suitable for a 26 mm Evolut valve). Recommend Heart Team discussion for valve selection.  Sinus of Valsalva Measurements:  Non-coronary:  29 mm  Right - coronary:  27 mm  Left - coronary:  27 mm  Coronary height and sinus of Valsalva Height:  Left main: 15 mm, Left sinus: 18 mm  Right coronary: 17 mm, Right sinus: 21 mm  Aorta: Conventional 3 vessel branch pattern of aortic arch.  Sinotubular Junction:  24 mm  Ascending Thoracic Aorta:  30 mm  Aortic Arch:  23 mm  Descending Thoracic Aorta:  23 mm  Coronary Arteries: Normal coronary origin. Right dominance. The study was performed without use of NTG and insufficient for plaque evaluation. Coronary artery calcium seen in 3 vessel distribution.  Optimum Fluoroscopic Angle for Delivery: LAO 3 CAU 3  OTHER:  Atria: Left atrial dilation  Left atrial appendage: No thrombus.  Mitral valve: Grossly normal, mild mitral annular calcifications.  Pulmonary artery: Moderately dilated, 36 mm, suggestive of elevated pulmonary pressures.  Pulmonary veins: Normal anatomy.  IMPRESSION: 1. Tricuspid aortic valve with severely reduced cusp excursion. Severely thickened and severely calcified aortic valve cusps. 2. Aortic valve calcium score: 1113 3. Annulus area: 405 mm2, suitable for 23 mm Sapien 3 valve. Trivial LVOT calcifications. Membranous septal length 8 mm. 4. Sufficient coronary artery heights from annulus. 5. Optimum fluoroscopic angle for delivery: LAO 3 CAU  3  Electronically  Signed: By: Soyla Merck M.D. On: 04/05/2024 17:13     ______________________________________________________________________________________________      ECG sinus    I have independently reviewed the above radiologic studies and discussed with the patient   Recent Lab Findings: Lab Results  Component Value Date   WBC 6.4 03/01/2024   HGB 10.5 (L) 03/29/2024   HCT 31.0 (L) 03/29/2024   PLT 151 03/01/2024   GLUCOSE 83 03/29/2024   ALT 11 03/01/2024   AST 12 (L) 03/01/2024   NA 136 03/29/2024   K 4.3 03/29/2024   CL 96 (L) 03/29/2024   CREATININE 4.53 (H) 03/29/2024   BUN 24 (H) 03/29/2024   CO2 23 03/29/2024   INR 2.78 10/26/2017      Assessment / Plan:   68 y.o. female with severe aortic stenosis.  STS score: 6.08.  NYHA Class II.  The risks and benefits of transfemoral TAVR vs SAVR were discussed in detail.  Although she is young, due to her ESRD and calciphylaxis, she would be very high risk for surgery.  We also discussed possibility of an emergent sternotomy to address any procedural complications.  Based on our discussion, we collectively decided that an emergent sternotomy would not be indicated.  The patient is agreeable to proceed.  Based on my review of her LHC, echo, and CTA, I agree with the multidisciplinary plan to proceed with a Evolut transfemoral TAVR.      I  spent 40 minutes counseling the patient face to face.   Linnie MALVA Rayas 04/22/2024 12:12 PM

## 2024-04-25 ENCOUNTER — Other Ambulatory Visit (HOSPITAL_COMMUNITY): Payer: Self-pay

## 2024-04-25 ENCOUNTER — Other Ambulatory Visit: Payer: Self-pay

## 2024-04-25 ENCOUNTER — Encounter: Payer: Self-pay | Admitting: Internal Medicine

## 2024-04-25 ENCOUNTER — Other Ambulatory Visit: Payer: Self-pay | Admitting: *Deleted

## 2024-04-25 DIAGNOSIS — I350 Nonrheumatic aortic (valve) stenosis: Secondary | ICD-10-CM

## 2024-04-25 MED ORDER — HEPARIN SODIUM (PORCINE) 5000 UNIT/ML IJ SOLN
INTRAMUSCULAR | 0 refills | Status: DC
  Filled 2024-04-25: qty 6, 3d supply, fill #0

## 2024-04-28 ENCOUNTER — Other Ambulatory Visit (HOSPITAL_COMMUNITY): Payer: Self-pay

## 2024-04-28 NOTE — Telephone Encounter (Signed)
 Requesting office sent a duplicate request stating 2nd request. Pt was cleared on 03/25/24 by Scot Ford, PAC.   I will re-fax clearance notes to requesting office.

## 2024-04-29 ENCOUNTER — Other Ambulatory Visit: Payer: Self-pay

## 2024-04-29 ENCOUNTER — Ambulatory Visit (HOSPITAL_COMMUNITY)
Admission: RE | Admit: 2024-04-29 | Discharge: 2024-04-29 | Disposition: A | Discharge status: Home / Self Care | Source: Ambulatory Visit | Attending: Internal Medicine | Admitting: Internal Medicine

## 2024-04-29 ENCOUNTER — Encounter (HOSPITAL_COMMUNITY)
Admission: RE | Admit: 2024-04-29 | Discharge: 2024-04-29 | Disposition: A | Discharge status: Home / Self Care | Source: Ambulatory Visit | Attending: Internal Medicine | Admitting: Internal Medicine

## 2024-04-29 DIAGNOSIS — I350 Nonrheumatic aortic (valve) stenosis: Secondary | ICD-10-CM | POA: Insufficient documentation

## 2024-04-29 DIAGNOSIS — Z01818 Encounter for other preprocedural examination: Secondary | ICD-10-CM | POA: Insufficient documentation

## 2024-04-29 LAB — CBC
HCT: 36.4 % (ref 36.0–46.0)
Hemoglobin: 11.2 g/dL — ABNORMAL LOW (ref 12.0–15.0)
MCH: 27.7 pg (ref 26.0–34.0)
MCHC: 30.8 g/dL (ref 30.0–36.0)
MCV: 90.1 fL (ref 80.0–100.0)
Platelets: 153 K/uL (ref 150–400)
RBC: 4.04 MIL/uL (ref 3.87–5.11)
RDW: 16.8 % — ABNORMAL HIGH (ref 11.5–15.5)
WBC: 5.4 K/uL (ref 4.0–10.5)
nRBC: 0 % (ref 0.0–0.2)

## 2024-04-29 LAB — COMPREHENSIVE METABOLIC PANEL WITH GFR
ALT: 8 U/L (ref 0–44)
AST: 17 U/L (ref 15–41)
Albumin: 3.5 g/dL (ref 3.5–5.0)
Alkaline Phosphatase: 72 U/L (ref 38–126)
Anion gap: 15 (ref 5–15)
BUN: 12 mg/dL (ref 8–23)
CO2: 23 mmol/L (ref 22–32)
Calcium: 9.2 mg/dL (ref 8.9–10.3)
Chloride: 99 mmol/L (ref 98–111)
Creatinine, Ser: 2.8 mg/dL — ABNORMAL HIGH (ref 0.44–1.00)
GFR, Estimated: 18 mL/min — ABNORMAL LOW (ref >60)
Glucose, Bld: 94 mg/dL (ref 70–99)
Potassium: 3.6 mmol/L (ref 3.5–5.1)
Sodium: 137 mmol/L (ref 135–145)
Total Bilirubin: 0.8 mg/dL (ref 0.0–1.2)
Total Protein: 7.2 g/dL (ref 6.5–8.1)

## 2024-04-29 LAB — PROTIME-INR
INR: 1.2 (ref 0.8–1.2)
Prothrombin Time: 16 s — ABNORMAL HIGH (ref 11.4–15.2)

## 2024-04-29 LAB — SURGICAL PCR SCREEN
MRSA, PCR: NEGATIVE
Staphylococcus aureus: NEGATIVE

## 2024-04-29 NOTE — Progress Notes (Signed)
 Patient signed all consents at PAT lab appointment. CHG soap and instructions were given to patient. CHG surgical prep reviewed with patient and all questions answered.  Patients chart send to anesthesia for review. Pt denies any respiratory illness/infection in the last two months.   Unable to obtain urine sample as pt is anuric. Order discontinued.  Pt confirmed she will receive HD the day before surgery.

## 2024-05-01 LAB — TYPE AND SCREEN
ABO/RH(D): A POS
Antibody Screen: NEGATIVE

## 2024-05-02 MED ORDER — DEXMEDETOMIDINE HCL IN NACL 400 MCG/100ML IV SOLN
0.1000 ug/kg/h | INTRAVENOUS | Status: DC
  Filled 2024-05-02: qty 100

## 2024-05-02 MED ORDER — HEPARIN 30,000 UNITS/1000 ML (OHS) CELLSAVER SOLUTION
Status: DC
  Filled 2024-05-02: qty 1000

## 2024-05-02 MED ORDER — MAGNESIUM SULFATE 50 % IJ SOLN
40.0000 meq | INTRAMUSCULAR | Status: DC
  Filled 2024-05-02: qty 9.85

## 2024-05-02 MED ORDER — NOREPINEPHRINE 4 MG/250ML-% IV SOLN
0.0000 ug/min | INTRAVENOUS | Status: DC
  Filled 2024-05-02: qty 250

## 2024-05-02 MED ORDER — CEFAZOLIN SODIUM-DEXTROSE 2-4 GM/100ML-% IV SOLN
2.0000 g | INTRAVENOUS | Status: AC
  Administered 2024-05-03: 2 g via INTRAVENOUS
  Filled 2024-05-02: qty 100

## 2024-05-02 MED ORDER — POTASSIUM CHLORIDE 2 MEQ/ML IV SOLN
80.0000 meq | INTRAVENOUS | Status: DC
  Filled 2024-05-02: qty 40

## 2024-05-02 NOTE — Anesthesia Preprocedure Evaluation (Addendum)
 Anesthesia Evaluation  Patient identified by MRN, date of birth, ID band Patient awake    Reviewed: Allergy & Precautions, NPO status , Patient's Chart, lab work & pertinent test results  Airway Mallampati: II  TM Distance: >3 FB Neck ROM: Full    Dental  (+) Dental Advisory Given   Pulmonary asthma , sleep apnea , COPD, former smoker   breath sounds clear to auscultation       Cardiovascular hypertension, Pt. on medications  Rhythm:Regular Rate:Normal     Neuro/Psych  Neuromuscular disease    GI/Hepatic negative GI ROS, Neg liver ROS,,,  Endo/Other  negative endocrine ROS    Renal/GU ESRF and DialysisRenal disease     Musculoskeletal   Abdominal   Peds  Hematology  (+) Blood dyscrasia, anemia   Anesthesia Other Findings   Reproductive/Obstetrics                              Anesthesia Physical Anesthesia Plan  ASA: 4  Anesthesia Plan: General   Post-op Pain Management:    Induction: Intravenous  PONV Risk Score and Plan: 3 and Ondansetron , Dexamethasone and Treatment may vary due to age or medical condition  Airway Management Planned: Oral ETT  Additional Equipment: None  Intra-op Plan:   Post-operative Plan: Extubation in OR  Informed Consent: I have reviewed the patients History and Physical, chart, labs and discussed the procedure including the risks, benefits and alternatives for the proposed anesthesia with the patient or authorized representative who has indicated his/her understanding and acceptance.     Dental advisory given  Plan Discussed with: CRNA  Anesthesia Plan Comments: (History ESRD. Recurrent thromboembolic disease with heterozygous factor V Leiden mutation, prothrombin 2 gene mutation, on lifelong anticoagulation (on heparin  bridge when off Eliquis ).   Per communication by Izetta Hummer, PA-C with cardiology: Severe RLS. Plan GA and keep intubated  for 4 hours to complete bedrest. Eden Rho, PA-C)         Anesthesia Quick Evaluation

## 2024-05-03 ENCOUNTER — Inpatient Hospital Stay (HOSPITAL_COMMUNITY)
Admission: RE | Disposition: A | Discharge status: Home / Self Care | Payer: Self-pay | Source: Home / Self Care | Attending: Internal Medicine

## 2024-05-03 ENCOUNTER — Inpatient Hospital Stay (HOSPITAL_COMMUNITY)

## 2024-05-03 ENCOUNTER — Inpatient Hospital Stay (HOSPITAL_COMMUNITY)
Admission: RE | Admit: 2024-05-03 | Discharge: 2024-05-04 | DRG: 266 | Disposition: A | Discharge status: Home / Self Care | Attending: Internal Medicine | Admitting: Internal Medicine

## 2024-05-03 ENCOUNTER — Inpatient Hospital Stay (HOSPITAL_COMMUNITY): Payer: Self-pay | Admitting: Vascular Surgery

## 2024-05-03 ENCOUNTER — Encounter (HOSPITAL_COMMUNITY): Payer: Self-pay | Admitting: Internal Medicine

## 2024-05-03 ENCOUNTER — Inpatient Hospital Stay (HOSPITAL_COMMUNITY): Admitting: Certified Registered Nurse Anesthetist

## 2024-05-03 DIAGNOSIS — Z825 Family history of asthma and other chronic lower respiratory diseases: Secondary | ICD-10-CM

## 2024-05-03 DIAGNOSIS — I82A19 Acute embolism and thrombosis of unspecified axillary vein: Secondary | ICD-10-CM | POA: Diagnosis present

## 2024-05-03 DIAGNOSIS — E785 Hyperlipidemia, unspecified: Secondary | ICD-10-CM | POA: Diagnosis present

## 2024-05-03 DIAGNOSIS — Z992 Dependence on renal dialysis: Secondary | ICD-10-CM | POA: Diagnosis not present

## 2024-05-03 DIAGNOSIS — Z9911 Dependence on respirator [ventilator] status: Secondary | ICD-10-CM

## 2024-05-03 DIAGNOSIS — G894 Chronic pain syndrome: Secondary | ICD-10-CM | POA: Diagnosis present

## 2024-05-03 DIAGNOSIS — I350 Nonrheumatic aortic (valve) stenosis: Secondary | ICD-10-CM

## 2024-05-03 DIAGNOSIS — I10 Essential (primary) hypertension: Secondary | ICD-10-CM | POA: Diagnosis not present

## 2024-05-03 DIAGNOSIS — G4733 Obstructive sleep apnea (adult) (pediatric): Secondary | ICD-10-CM | POA: Diagnosis present

## 2024-05-03 DIAGNOSIS — Z79899 Other long term (current) drug therapy: Secondary | ICD-10-CM | POA: Diagnosis not present

## 2024-05-03 DIAGNOSIS — J4489 Other specified chronic obstructive pulmonary disease: Secondary | ICD-10-CM | POA: Diagnosis present

## 2024-05-03 DIAGNOSIS — Z006 Encounter for examination for normal comparison and control in clinical research program: Secondary | ICD-10-CM

## 2024-05-03 DIAGNOSIS — D649 Anemia, unspecified: Secondary | ICD-10-CM | POA: Diagnosis present

## 2024-05-03 DIAGNOSIS — D6852 Prothrombin gene mutation: Secondary | ICD-10-CM | POA: Diagnosis present

## 2024-05-03 DIAGNOSIS — Z7901 Long term (current) use of anticoagulants: Secondary | ICD-10-CM | POA: Diagnosis not present

## 2024-05-03 DIAGNOSIS — Z823 Family history of stroke: Secondary | ICD-10-CM

## 2024-05-03 DIAGNOSIS — J449 Chronic obstructive pulmonary disease, unspecified: Secondary | ICD-10-CM | POA: Diagnosis present

## 2024-05-03 DIAGNOSIS — N186 End stage renal disease: Secondary | ICD-10-CM | POA: Diagnosis present

## 2024-05-03 DIAGNOSIS — Z811 Family history of alcohol abuse and dependence: Secondary | ICD-10-CM

## 2024-05-03 DIAGNOSIS — I120 Hypertensive chronic kidney disease with stage 5 chronic kidney disease or end stage renal disease: Secondary | ICD-10-CM | POA: Diagnosis present

## 2024-05-03 DIAGNOSIS — Z833 Family history of diabetes mellitus: Secondary | ICD-10-CM | POA: Diagnosis not present

## 2024-05-03 DIAGNOSIS — I480 Paroxysmal atrial fibrillation: Secondary | ICD-10-CM | POA: Diagnosis present

## 2024-05-03 DIAGNOSIS — Z9289 Personal history of other medical treatment: Secondary | ICD-10-CM

## 2024-05-03 DIAGNOSIS — M792 Neuralgia and neuritis, unspecified: Secondary | ICD-10-CM | POA: Diagnosis present

## 2024-05-03 DIAGNOSIS — D631 Anemia in chronic kidney disease: Secondary | ICD-10-CM | POA: Diagnosis present

## 2024-05-03 DIAGNOSIS — Z6835 Body mass index (BMI) 35.0-35.9, adult: Secondary | ICD-10-CM

## 2024-05-03 DIAGNOSIS — Z952 Presence of prosthetic heart valve: Secondary | ICD-10-CM | POA: Diagnosis not present

## 2024-05-03 DIAGNOSIS — D6851 Activated protein C resistance: Secondary | ICD-10-CM | POA: Diagnosis present

## 2024-05-03 DIAGNOSIS — Z7951 Long term (current) use of inhaled steroids: Secondary | ICD-10-CM

## 2024-05-03 DIAGNOSIS — G2581 Restless legs syndrome: Secondary | ICD-10-CM | POA: Diagnosis present

## 2024-05-03 DIAGNOSIS — Z87891 Personal history of nicotine dependence: Secondary | ICD-10-CM | POA: Diagnosis not present

## 2024-05-03 DIAGNOSIS — Z888 Allergy status to other drugs, medicaments and biological substances status: Secondary | ICD-10-CM

## 2024-05-03 DIAGNOSIS — Z8249 Family history of ischemic heart disease and other diseases of the circulatory system: Secondary | ICD-10-CM

## 2024-05-03 DIAGNOSIS — Z91048 Other nonmedicinal substance allergy status: Secondary | ICD-10-CM

## 2024-05-03 HISTORY — DX: Nonrheumatic aortic (valve) stenosis: I35.0

## 2024-05-03 HISTORY — PX: INTRAOPERATIVE TRANSTHORACIC ECHOCARDIOGRAM: SHX6523

## 2024-05-03 LAB — POCT I-STAT, CHEM 8
BUN: 36 mg/dL — ABNORMAL HIGH (ref 8–23)
BUN: 27 mg/dL — ABNORMAL HIGH (ref 8–23)
Calcium, Ion: 0.89 mmol/L — CL (ref 1.15–1.40)
Calcium, Ion: 1.04 mmol/L — ABNORMAL LOW (ref 1.15–1.40)
Chloride: 100 mmol/L (ref 98–111)
Chloride: 99 mmol/L (ref 98–111)
Creatinine, Ser: 5.3 mg/dL — ABNORMAL HIGH (ref 0.44–1.00)
Creatinine, Ser: 4.8 mg/dL — ABNORMAL HIGH (ref 0.44–1.00)
Glucose, Bld: 68 mg/dL — ABNORMAL LOW (ref 70–99)
Glucose, Bld: 75 mg/dL (ref 70–99)
HCT: 39 % (ref 36.0–46.0)
HCT: 32 % — ABNORMAL LOW (ref 36.0–46.0)
Hemoglobin: 13.3 g/dL (ref 12.0–15.0)
Hemoglobin: 10.9 g/dL — ABNORMAL LOW (ref 12.0–15.0)
Potassium: 5.6 mmol/L — ABNORMAL HIGH (ref 3.5–5.1)
Potassium: 4.2 mmol/L (ref 3.5–5.1)
Sodium: 135 mmol/L (ref 135–145)
Sodium: 136 mmol/L (ref 135–145)
TCO2: 25 mmol/L (ref 22–32)
TCO2: 25 mmol/L (ref 22–32)

## 2024-05-03 LAB — ECHOCARDIOGRAM LIMITED
AR max vel: 2.18 cm2
AV Area VTI: 2.28 cm2
AV Area mean vel: 2.21 cm2
AV Mean grad: 11 mmHg
AV Peak grad: 20.6 mmHg
Ao pk vel: 2.27 m/s
Area-P 1/2: 2.99 cm2
S' Lateral: 3.3 cm

## 2024-05-03 LAB — APTT: aPTT: 36 s (ref 24–36)

## 2024-05-03 LAB — BASIC METABOLIC PANEL WITH GFR
Anion gap: 19 — ABNORMAL HIGH (ref 5–15)
BUN: 30 mg/dL — ABNORMAL HIGH (ref 8–23)
CO2: 20 mmol/L — ABNORMAL LOW (ref 22–32)
Calcium: 7.8 mg/dL — ABNORMAL LOW (ref 8.9–10.3)
Chloride: 97 mmol/L — ABNORMAL LOW (ref 98–111)
Creatinine, Ser: 5.25 mg/dL — ABNORMAL HIGH (ref 0.44–1.00)
GFR, Estimated: 8 mL/min — ABNORMAL LOW (ref >60)
Glucose, Bld: 85 mg/dL (ref 70–99)
Potassium: 4.4 mmol/L (ref 3.5–5.1)
Sodium: 136 mmol/L (ref 135–145)

## 2024-05-03 LAB — POCT I-STAT 7, (LYTES, BLD GAS, ICA,H+H)
Acid-base deficit: 4 mmol/L — ABNORMAL HIGH (ref 0.0–2.0)
Bicarbonate: 16 mmol/L — ABNORMAL LOW (ref 20.0–28.0)
Calcium, Ion: 0.98 mmol/L — ABNORMAL LOW (ref 1.15–1.40)
HCT: 37 % (ref 36.0–46.0)
Hemoglobin: 12.6 g/dL (ref 12.0–15.0)
O2 Saturation: 100 %
Patient temperature: 98.6
Potassium: 4.7 mmol/L (ref 3.5–5.1)
Sodium: 134 mmol/L — ABNORMAL LOW (ref 135–145)
TCO2: 17 mmol/L — ABNORMAL LOW (ref 22–32)
pCO2 arterial: 17.5 mmHg — CL (ref 32–48)
pH, Arterial: 7.568 — ABNORMAL HIGH (ref 7.35–7.45)
pO2, Arterial: 196 mmHg — ABNORMAL HIGH (ref 83–108)

## 2024-05-03 LAB — CBC
HCT: 38.5 % (ref 36.0–46.0)
Hemoglobin: 11.5 g/dL — ABNORMAL LOW (ref 12.0–15.0)
MCH: 27.9 pg (ref 26.0–34.0)
MCHC: 29.9 g/dL — ABNORMAL LOW (ref 30.0–36.0)
MCV: 93.4 fL (ref 80.0–100.0)
Platelets: 163 K/uL (ref 150–400)
RBC: 4.12 MIL/uL (ref 3.87–5.11)
RDW: 17.5 % — ABNORMAL HIGH (ref 11.5–15.5)
WBC: 10.3 K/uL (ref 4.0–10.5)
nRBC: 0 % (ref 0.0–0.2)

## 2024-05-03 LAB — MAGNESIUM: Magnesium: 2 mg/dL (ref 1.7–2.4)

## 2024-05-03 LAB — PROTIME-INR
INR: 1.2 (ref 0.8–1.2)
Prothrombin Time: 15.9 s — ABNORMAL HIGH (ref 11.4–15.2)

## 2024-05-03 LAB — MRSA NEXT GEN BY PCR, NASAL: MRSA by PCR Next Gen: NOT DETECTED

## 2024-05-03 LAB — ABO/RH: ABO/RH(D): A POS

## 2024-05-03 LAB — POCT ACTIVATED CLOTTING TIME: Activated Clotting Time: 314 s

## 2024-05-03 SURGERY — TRANSCATHETER AORTIC VALVE REPLACEMENT, TRANSFEMORAL (CATHLAB)
Anesthesia: General

## 2024-05-03 MED ORDER — NOREPINEPHRINE 4 MG/250ML-% IV SOLN
INTRAVENOUS | Status: DC | PRN
  Administered 2024-05-03: 2 ug/min via INTRAVENOUS

## 2024-05-03 MED ORDER — LIDOCAINE 2% (20 MG/ML) 5 ML SYRINGE
INTRAMUSCULAR | Status: DC | PRN
  Administered 2024-05-03: 60 mg via INTRAVENOUS

## 2024-05-03 MED ORDER — GABAPENTIN 100 MG PO CAPS
100.0000 mg | ORAL_CAPSULE | Freq: Two times a day (BID) | ORAL | Status: DC
  Administered 2024-05-03: 100 mg via ORAL
  Filled 2024-05-03: qty 1

## 2024-05-03 MED ORDER — BUDESONIDE 0.25 MG/2ML IN SUSP
0.2500 mg | Freq: Two times a day (BID) | RESPIRATORY_TRACT | Status: DC
  Administered 2024-05-03: 0.25 mg via RESPIRATORY_TRACT
  Filled 2024-05-03: qty 2

## 2024-05-03 MED ORDER — ONDANSETRON HCL 4 MG/2ML IJ SOLN
INTRAMUSCULAR | Status: DC | PRN
  Administered 2024-05-03: 4 mg via INTRAVENOUS

## 2024-05-03 MED ORDER — ARFORMOTEROL TARTRATE 15 MCG/2ML IN NEBU
15.0000 ug | INHALATION_SOLUTION | Freq: Two times a day (BID) | RESPIRATORY_TRACT | Status: DC
  Administered 2024-05-03: 15 ug via RESPIRATORY_TRACT
  Filled 2024-05-03: qty 2

## 2024-05-03 MED ORDER — CEFAZOLIN SODIUM-DEXTROSE 2-4 GM/100ML-% IV SOLN: 2.0000 g | Freq: Three times a day (TID) | INTRAVENOUS | Status: DC

## 2024-05-03 MED ORDER — SODIUM CHLORIDE 0.9% FLUSH: 3.0000 mL | INTRAVENOUS | Status: DC | PRN

## 2024-05-03 MED ORDER — LIDOCAINE HCL (PF) 1 % IJ SOLN
INTRAMUSCULAR | Status: DC | PRN
Start: 2024-05-03 — End: 2024-05-03
  Administered 2024-05-03: 20 mL

## 2024-05-03 MED ORDER — HYDRALAZINE HCL 20 MG/ML IJ SOLN
10.0000 mg | Freq: Once | INTRAMUSCULAR | Status: AC
  Administered 2024-05-03: 10 mg via INTRAVENOUS
  Filled 2024-05-03: qty 1

## 2024-05-03 MED ORDER — NITROGLYCERIN IN D5W 200-5 MCG/ML-% IV SOLN
0.0000 ug/min | INTRAVENOUS | Status: DC
  Administered 2024-05-03: 5 ug/min via INTRAVENOUS
  Filled 2024-05-03: qty 250

## 2024-05-03 MED ORDER — OXYCODONE HCL 5 MG PO TABS
5.0000 mg | ORAL_TABLET | ORAL | Status: DC | PRN
  Administered 2024-05-03 (×2): 5 mg via ORAL
  Filled 2024-05-03 (×2): qty 1

## 2024-05-03 MED ORDER — SODIUM CHLORIDE 0.9 % IV SOLN: INTRAVENOUS | Status: DC | PRN

## 2024-05-03 MED ORDER — ROPINIROLE HCL 1 MG PO TABS
2.0000 mg | ORAL_TABLET | Freq: Every day | ORAL | Status: DC
  Filled 2024-05-03: qty 2

## 2024-05-03 MED ORDER — ROPINIROLE HCL 0.5 MG PO TABS
0.5000 mg | ORAL_TABLET | Freq: Every day | ORAL | Status: DC
  Filled 2024-05-03: qty 1

## 2024-05-03 MED ORDER — ATORVASTATIN CALCIUM 10 MG PO TABS
20.0000 mg | ORAL_TABLET | Freq: Every evening | ORAL | Status: DC
Start: 2024-05-03 — End: 2024-05-04
  Filled 2024-05-03 (×2): qty 2

## 2024-05-03 MED ORDER — POLYETHYLENE GLYCOL 3350 17 G PO PACK: 17.0000 g | PACK | Freq: Every day | ORAL | Status: DC

## 2024-05-03 MED ORDER — AMIODARONE HCL 200 MG PO TABS
200.0000 mg | ORAL_TABLET | Freq: Every morning | ORAL | Status: DC
  Administered 2024-05-04: 200 mg via ORAL
  Filled 2024-05-03: qty 1

## 2024-05-03 MED ORDER — ORAL CARE MOUTH RINSE
15.0000 mL | OROMUCOSAL | Status: DC
  Administered 2024-05-03 – 2024-05-04 (×4): 15 mL via OROMUCOSAL

## 2024-05-03 MED ORDER — FENTANYL CITRATE (PF) 100 MCG/2ML IJ SOLN
INTRAMUSCULAR | Status: AC
Start: 2024-05-03 — End: 2024-05-03
  Filled 2024-05-03: qty 2

## 2024-05-03 MED ORDER — HEPARIN (PORCINE) IN NACL 1000-0.9 UT/500ML-% IV SOLN
INTRAVENOUS | Status: DC | PRN
  Administered 2024-05-03: 1000 mL via SURGICAL_CAVITY

## 2024-05-03 MED ORDER — SUCCINYLCHOLINE CHLORIDE 200 MG/10ML IV SOSY
PREFILLED_SYRINGE | INTRAVENOUS | Status: DC | PRN
  Administered 2024-05-03: 100 mg via INTRAVENOUS

## 2024-05-03 MED ORDER — HEPARIN SODIUM (PORCINE) 1000 UNIT/ML IJ SOLN
INTRAMUSCULAR | Status: DC | PRN
  Administered 2024-05-03: 13000 [IU] via INTRAVENOUS

## 2024-05-03 MED ORDER — ACETAMINOPHEN 650 MG RE SUPP: 650.0000 mg | Freq: Four times a day (QID) | RECTAL | Status: DC | PRN

## 2024-05-03 MED ORDER — CHLORHEXIDINE GLUCONATE 0.12 % MT SOLN
15.0000 mL | Freq: Once | OROMUCOSAL | Status: AC
  Administered 2024-05-03: 15 mL via OROMUCOSAL
  Filled 2024-05-03: qty 15

## 2024-05-03 MED ORDER — FLUTICASONE FUROATE-VILANTEROL 200-25 MCG/ACT IN AEPB
1.0000 | INHALATION_SPRAY | Freq: Every day | RESPIRATORY_TRACT | Status: DC
  Filled 2024-05-03: qty 28

## 2024-05-03 MED ORDER — CHLORHEXIDINE GLUCONATE CLOTH 2 % EX PADS
6.0000 | MEDICATED_PAD | Freq: Every day | CUTANEOUS | Status: DC
  Administered 2024-05-03 – 2024-05-04 (×2): 6 via TOPICAL

## 2024-05-03 MED ORDER — MORPHINE SULFATE (PF) 2 MG/ML IV SOLN: 1.0000 mg | INTRAVENOUS | Status: DC | PRN

## 2024-05-03 MED ORDER — CHLORHEXIDINE GLUCONATE 4 % EX SOLN
60.0000 mL | Freq: Once | CUTANEOUS | Status: DC
  Filled 2024-05-03: qty 60

## 2024-05-03 MED ORDER — UMECLIDINIUM BROMIDE 62.5 MCG/ACT IN AEPB
1.0000 | INHALATION_SPRAY | Freq: Every day | RESPIRATORY_TRACT | Status: DC
Start: 2024-05-03 — End: 2024-05-03
  Filled 2024-05-03: qty 7

## 2024-05-03 MED ORDER — DOCUSATE SODIUM 50 MG/5ML PO LIQD
100.0000 mg | Freq: Two times a day (BID) | ORAL | Status: DC
  Administered 2024-05-03 – 2024-05-04 (×2): 100 mg via ORAL
  Filled 2024-05-03 (×2): qty 10

## 2024-05-03 MED ORDER — SODIUM CHLORIDE 0.9 % IV SOLN: INTRAVENOUS | Status: DC

## 2024-05-03 MED ORDER — NOREPINEPHRINE 4 MG/250ML-% IV SOLN: 0.0000 ug/min | INTRAVENOUS | Status: DC

## 2024-05-03 MED ORDER — FENTANYL CITRATE (PF) 100 MCG/2ML IJ SOLN: 50.0000 ug | Freq: Once | INTRAMUSCULAR | Status: AC

## 2024-05-03 MED ORDER — ROCURONIUM BROMIDE 10 MG/ML (PF) SYRINGE
PREFILLED_SYRINGE | INTRAVENOUS | Status: DC | PRN
  Administered 2024-05-03: 50 mg via INTRAVENOUS
  Administered 2024-05-03: 20 mg via INTRAVENOUS
  Administered 2024-05-03: 100 mg via INTRAVENOUS

## 2024-05-03 MED ORDER — OXYCODONE HCL 5 MG PO TABS: 5.0000 mg | ORAL_TABLET | Freq: Every day | ORAL | Status: DC | PRN

## 2024-05-03 MED ORDER — ONDANSETRON 4 MG PO TBDP
4.0000 mg | ORAL_TABLET | ORAL | Status: DC
  Filled 2024-05-03: qty 1

## 2024-05-03 MED ORDER — FENTANYL CITRATE (PF) 100 MCG/2ML IJ SOLN: 25.0000 ug | INTRAMUSCULAR | Status: DC | PRN

## 2024-05-03 MED ORDER — LORATADINE 10 MG PO TABS
10.0000 mg | ORAL_TABLET | Freq: Every day | ORAL | Status: DC
  Administered 2024-05-04: 10 mg via ORAL
  Filled 2024-05-03 (×2): qty 1

## 2024-05-03 MED ORDER — ONDANSETRON HCL 4 MG/2ML IJ SOLN: 4.0000 mg | Freq: Four times a day (QID) | INTRAMUSCULAR | Status: DC | PRN

## 2024-05-03 MED ORDER — DOCUSATE SODIUM 50 MG/5ML PO LIQD: 100.0000 mg | Freq: Two times a day (BID) | ORAL | Status: DC

## 2024-05-03 MED ORDER — REVEFENACIN 175 MCG/3ML IN SOLN
175.0000 ug | Freq: Every day | RESPIRATORY_TRACT | Status: DC
  Administered 2024-05-03: 175 ug via RESPIRATORY_TRACT
  Filled 2024-05-03: qty 3

## 2024-05-03 MED ORDER — PROPOFOL 1000 MG/100ML IV EMUL
0.0000 ug/kg/min | INTRAVENOUS | Status: DC
  Administered 2024-05-03: 50 ug/kg/min via INTRAVENOUS
  Filled 2024-05-03: qty 100

## 2024-05-03 MED ORDER — ROPINIROLE HCL 1 MG PO TABS
2.0000 mg | ORAL_TABLET | Freq: Every day | ORAL | Status: DC
  Administered 2024-05-03: 2 mg via ORAL
  Filled 2024-05-03: qty 2

## 2024-05-03 MED ORDER — ACETAMINOPHEN 325 MG PO TABS: 650.0000 mg | ORAL_TABLET | Freq: Four times a day (QID) | ORAL | Status: DC | PRN

## 2024-05-03 MED ORDER — ORAL CARE MOUTH RINSE: 15.0000 mL | OROMUCOSAL | Status: DC | PRN

## 2024-05-03 MED ORDER — PROTAMINE SULFATE 10 MG/ML IV SOLN
INTRAVENOUS | Status: DC | PRN
  Administered 2024-05-03: 130 mg via INTRAVENOUS

## 2024-05-03 MED ORDER — FENTANYL CITRATE PF 50 MCG/ML IJ SOSY
25.0000 ug | PREFILLED_SYRINGE | Freq: Once | INTRAMUSCULAR | Status: AC
  Administered 2024-05-03: 50 ug via INTRAVENOUS
  Filled 2024-05-03: qty 1

## 2024-05-03 MED ORDER — FENTANYL CITRATE PF 50 MCG/ML IJ SOSY: 25.0000 ug | PREFILLED_SYRINGE | INTRAMUSCULAR | Status: DC | PRN

## 2024-05-03 MED ORDER — FENTANYL CITRATE (PF) 100 MCG/2ML IJ SOLN
INTRAMUSCULAR | Status: AC
Start: 2024-05-03 — End: 2024-05-03
  Administered 2024-05-03: 50 ug via INTRAVENOUS
  Filled 2024-05-03: qty 2

## 2024-05-03 MED ORDER — PANTOPRAZOLE SODIUM 40 MG PO TBEC
40.0000 mg | DELAYED_RELEASE_TABLET | Freq: Every day | ORAL | Status: DC
  Administered 2024-05-04: 40 mg via ORAL
  Filled 2024-05-03 (×2): qty 1

## 2024-05-03 MED ORDER — SODIUM CHLORIDE 0.9% FLUSH
3.0000 mL | Freq: Two times a day (BID) | INTRAVENOUS | Status: DC
  Administered 2024-05-03 – 2024-05-04 (×2): 3 mL via INTRAVENOUS

## 2024-05-03 MED ORDER — SODIUM CHLORIDE 0.9 % IV SOLN: INTRAVENOUS | Status: AC

## 2024-05-03 MED ORDER — CHLORHEXIDINE GLUCONATE 4 % EX SOLN
30.0000 mL | CUTANEOUS | Status: DC
  Filled 2024-05-03: qty 30

## 2024-05-03 MED ORDER — NOREPINEPHRINE BITARTRATE 1 MG/ML IV SOLN
INTRAVENOUS | Status: DC | PRN
  Administered 2024-05-03 (×2): .5 mL via INTRAVENOUS

## 2024-05-03 MED ORDER — TRAMADOL HCL 50 MG PO TABS: 50.0000 mg | ORAL_TABLET | ORAL | Status: DC | PRN

## 2024-05-03 MED ORDER — FENTANYL CITRATE (PF) 250 MCG/5ML IJ SOLN
INTRAMUSCULAR | Status: DC | PRN
  Administered 2024-05-03 (×4): 50 ug via INTRAVENOUS

## 2024-05-03 MED ORDER — DROPERIDOL 2.5 MG/ML IJ SOLN: 0.6250 mg | Freq: Once | INTRAMUSCULAR | Status: DC | PRN

## 2024-05-03 MED ORDER — FENTANYL BOLUS VIA INFUSION: 25.0000 ug | INTRAVENOUS | Status: DC | PRN

## 2024-05-03 MED ORDER — PROPOFOL 10 MG/ML IV BOLUS
INTRAVENOUS | Status: DC | PRN
  Administered 2024-05-03: 130 mg via INTRAVENOUS
  Administered 2024-05-03: 100 mg via INTRAVENOUS

## 2024-05-03 MED ORDER — SUGAMMADEX SODIUM 200 MG/2ML IV SOLN
INTRAVENOUS | Status: DC | PRN
  Administered 2024-05-03: 163.2 mg via INTRAVENOUS

## 2024-05-03 MED ORDER — APIXABAN 5 MG PO TABS
5.0000 mg | ORAL_TABLET | Freq: Two times a day (BID) | ORAL | Status: DC
  Administered 2024-05-04: 5 mg via ORAL
  Filled 2024-05-03: qty 1

## 2024-05-03 MED ORDER — TRAMADOL HCL 50 MG PO TABS
50.0000 mg | ORAL_TABLET | Freq: Two times a day (BID) | ORAL | Status: DC
  Administered 2024-05-03 – 2024-05-04 (×2): 50 mg via ORAL
  Filled 2024-05-03 (×2): qty 1

## 2024-05-03 MED ORDER — CLEVIDIPINE BUTYRATE 0.5 MG/ML IV EMUL
INTRAVENOUS | Status: DC | PRN
  Administered 2024-05-03: 12 mg/h via INTRAVENOUS

## 2024-05-03 MED ORDER — FENTANYL CITRATE (PF) 100 MCG/2ML IJ SOLN
INTRAMUSCULAR | Status: AC
  Filled 2024-05-03: qty 2

## 2024-05-03 MED ORDER — SODIUM CHLORIDE 0.9 % IV SOLN: 250.0000 mL | INTRAVENOUS | Status: DC | PRN

## 2024-05-03 MED ORDER — FENTANYL 2500MCG IN NS 250ML (10MCG/ML) PREMIX INFUSION
0.0000 ug/h | INTRAVENOUS | Status: DC
  Administered 2024-05-03: 50 ug/h via INTRAVENOUS
  Filled 2024-05-03: qty 250

## 2024-05-03 MED ORDER — SULFASALAZINE 500 MG PO TABS
1000.0000 mg | ORAL_TABLET | Freq: Two times a day (BID) | ORAL | Status: DC
Start: 2024-05-03 — End: 2024-05-04
  Filled 2024-05-03 (×2): qty 2

## 2024-05-03 MED ORDER — IODIXANOL 320 MG/ML IV SOLN
INTRAVENOUS | Status: DC | PRN
  Administered 2024-05-03: 60 mL via INTRA_ARTERIAL

## 2024-05-03 MED ORDER — ROPINIROLE HCL 1 MG PO TABS: 2.0000 mg | ORAL_TABLET | Freq: Every day | ORAL | Status: DC

## 2024-05-03 SURGICAL SUPPLY — 28 items
BAG SNAP BAND KOVER 36X36 (MISCELLANEOUS) ×2 IMPLANT
BALLOON TRUE 20X4.5 (BALLOONS) IMPLANT
CABLE SURGICAL S-101-97-12 (CABLE) IMPLANT
CATH DIAG 6FR PIGTAIL ANGLED (CATHETERS) IMPLANT
CATH INFINITI 5 FR STR PIGTAIL (CATHETERS) IMPLANT
CATH INFINITI 6F AL1 (CATHETERS) IMPLANT
CLOSURE PERCLOSE PROSTYLE (VASCULAR PRODUCTS) IMPLANT
DEVICE CLOSURE MYNXGRIP 6/7F (Vascular Products) IMPLANT
ELECT DEFIB PAD ADLT CADENCE (PAD) IMPLANT
PACK CARDIAC CATHETERIZATION (CUSTOM PROCEDURE TRAY) ×1 IMPLANT
PAD SORBX EP SHIELD 16.5X12 (MISCELLANEOUS) IMPLANT
SET ATX-X65L (MISCELLANEOUS) IMPLANT
SHEATH PERFORMER 18FRX30 (VASCULAR PRODUCTS) IMPLANT
SHEATH PINNACLE 6F 10CM (SHEATH) IMPLANT
SHEATH PINNACLE 8F 10CM (SHEATH) IMPLANT
SHEATH PROBE COVER 6X72 (BAG) IMPLANT
STOPCOCK MORSE 400PSI 3WAY (MISCELLANEOUS) ×2 IMPLANT
SYSTEM EVOLUT FX DELIVRY 23-29 (CATHETERS) IMPLANT
SYSTEM EVOLUT FX LOADING 23-29 (CATHETERS) IMPLANT
TUBING ART PRESS 72 MALE/FEM (TUBING) IMPLANT
TUBING CIL FLEX 10 FLL-RA (TUBING) IMPLANT
VALVE EVOLUT FX+ TRANS 29 (Valve) IMPLANT
WIRE AMPLATZ SS-J .035X180CM (WIRE) IMPLANT
WIRE EMERALD 3MM-J .035X150CM (WIRE) IMPLANT
WIRE EMERALD 3MM-J .035X260CM (WIRE) IMPLANT
WIRE EMERALD ST .035X260CM (WIRE) IMPLANT
WIRE MICRO SET SILHO 5FR 7 (SHEATH) IMPLANT
WIRE SAFARI SM CURVE 275 (WIRE) IMPLANT

## 2024-05-03 NOTE — Procedures (Signed)
 Extubation Procedure Note  Patient Details:   Name: Whitney Chavez DOB: 09-03-1956 MRN: [id number]   Airway Documentation:    Vent end date: 05/03/24 Vent end time: 2125   Evaluation  O2 sats: stable throughout Complications: No apparent complications Patient did tolerate procedure well. Bilateral Breath Sounds: Clear   Yes  Luiz Velinda Craze 05/03/2024, 9:34 PM

## 2024-05-03 NOTE — Anesthesia Procedure Notes (Signed)
 Procedure Name: Intubation Date/Time: 05/03/2024 2:19 PM  Performed by: Lamar Lucie DASEN, CRNAPre-anesthesia Checklist: Patient identified, Emergency Drugs available, Suction available and Patient being monitored Patient Re-evaluated:Patient Re-evaluated prior to induction Oxygen Delivery Method: Circle system utilized Preoxygenation: Pre-oxygenation with 100% oxygen Induction Type: IV induction Ventilation: Mask ventilation without difficulty and Oral airway inserted - appropriate to patient size Laryngoscope Size: Mac and 3 Grade View: Grade I Tube type: Oral Tube size: 7.0 mm Number of attempts: 1 Airway Equipment and Method: Stylet and Oral airway Placement Confirmation: ETT inserted through vocal cords under direct vision, positive ETCO2 and breath sounds checked- equal and bilateral Secured at: 21 cm Tube secured with: Tape Dental Injury: Teeth and Oropharynx as per pre-operative assessment

## 2024-05-03 NOTE — Discharge Summary (Incomplete)
 [facility]   MULTIDISCIPLINARY HEART VALVE TEAM  Discharge Summary    Patient ID: Whitney Chavez MRN: [id number]; DOB: 1955/11/11  Admit date: 05/03/2024 Discharge date: 05/04/2024  PCP:  Cleotilde Planas, MD  [facility] Cardiologist:  Lurena MARLA Red, MD  [facility] Structural heart: Lurena MARLA Red, MD [facility] Electrophysiologist:  None   Discharge Diagnoses    Principal Problem:   S/P TAVR (transcatheter aortic valve replacement) Active Problems:   OSA on CPAP   Factor 5 Leiden mutation, heterozygous (HCC)   Restless legs   Axillary vein thrombosis (HCC)   Asthma-COPD overlap syndrome (HCC)   End-stage renal disease on hemodialysis (HCC)   Morbid (severe) obesity due to excess calories (HCC)   Normocytic anemia   Chronic pain syndrome   Neuropathic pain   Calciphylaxis   Severe aortic stenosis   History of ETT   On mechanically assisted ventilation (HCC)   Hypocalcemia   Allergies Allergies  Allergen Reactions   Estrogens Other (See Comments)    Blood clots   Other Other (See Comments)    Unable to take birth control pills due to clotting disorder/fim   Prednisone Other (See Comments)    Facial edema - has had this 2020 and had no difficulty can take in small doses   Progesterone Other (See Comments)    Blood clots   Propofol  Other (See Comments)    Became disinhibited and exacerbated restless leg syndrome during EGD/colonoscopy. Patient has requested an alternate form of sedation during future procedures.  Legs started thrashing and skin tears.   Lisinopril Cough   Clotrimazole Rash   Tape Rash and Other (See Comments)    Please use paper tape    Diagnostic Studies/Procedures    [facility]  TAVR OPERATIVE NOTE     Date of Procedure:                05/03/2024   Preoperative Diagnosis:      Severe Aortic Stenosis    Postoperative Diagnosis:    Same    Procedure:        Transcatheter Aortic Valve  Replacement - Transfemoral Approach             Medtronic Evolut FX+ (size 29 mm)              Co-Surgeons:                        Lurena Red, MD and Linnie Rayas, MD   Anesthesiologist:                  Epifanio   Echocardiographer:              O'Neal   Pre-operative Echo Findings: Severe aortic stenosis Normal left ventricular systolic function   Post-operative Echo Findings: No paravalvular leak Normal left ventricular systolic function   Left Hearth Catheterization:             LVEDP                                             _____________    Echo 05/04/24: completed but pending formal read at the time of discharge   History of Present Illness     Whitney Chavez is a 68 y.o. female with a history  of ESRD (HD MWF), severe restless legs, Factor V Leyden heterozygous + Prothrombin II mutation on Eliquis , PAF, anemia of chronic disease, HLD, HTN, OSA on CPAP and severe aortic stenosis who presented to [facility] on 05/03/24 for planned TAVR.   She has been on dialysis for 14 years, with a well-functioning fistula. She experiences episodes of atrial fibrillation, typically when excessive fluid is removed during dialysis, leading to her feeling 'too dry.' She is on Eliquis  5 mg twice daily for factor V Leiden and prothrombin gene mutation, and she undergoes bridge therapy with heparin  before surgeries. She has a history of calciphylaxis and underwent a punch biopsy, followed by further surgery for a non-healing wound. She has a history of known aortic stenosis that was followed at [facility]; however, the pt wanted to transfer her care to [facility] and was set up with Dr. Wendel. Echo on 01/12/24 at [facility] showed EF 60% and severe AS with a mean grad 40.2 mmHg & AVA 0.78 cm2. Coronary angiography showed minimal nonobstructive CAD. Right heart catheterization was deferred due to occluded venous system of the right upper extremity and right internal jugular systems.  The patient was  evaluated by the multidisciplinary valve team and felt to have severe, symptomatic aortic stenosis and to be a suitable candidate for TAVR, which was set up for 05/03/24.   Hospital Course     Consultants: none   Severe AS:  -- S/p successful TAVR with a 29 mm Medtronic Evlout FX + THV via the TF approach on 05/03/24. -- Post operative echo completed but pending formal read. -- Groin sites are stable.  -- ECG/tele with sinus and no high grade heart block. -- Continue Eliquis  5mg  BID. -- Met with cardiac rehab to discuss CRP phase II.  -- Plan for discharge home today with close follow up in the outpatient setting.   Severe restless leg syndrome: -- Her case was completed using GA which was continued into her bedrest recovery.  ESRD on HD:  -- M,W,F HD.  -- Plan for discharge home today with outpatient HD. -- She will likely have to skip HD today.  -- Given a dose of Lokelma  10g today to ensure potassium stays under 5.   Factor V Leyden mutation: -- Continue Eliquis  5 mg twice daily -- Bridged with heparin  pre operatively.   Hyperlipidemia:  -- Continue atorvastatin  20 mg daily.  Anemia:  -- Hg stable at 10.3 -- Continue EPO 2000 units 3 times a week.  PAF: -- Maintaining sinus here.  -- Continue amiodarone  200mg  daily.  -- Continue Eliquis  5mg  BID.   _____________  Discharge Vitals Blood pressure (!) 105/57, pulse 70, temperature 98.8 F (37.1 C), temperature source Oral, resp. rate 17, height 5' (1.524 m), weight 81.6 kg, SpO2 99%.  Filed Weights   05/03/24 1058  Weight: 81.6 kg     GEN: Well nourished, well developed in no acute distress, obese NECK: No JVD CARDIAC: RRR, 1/6 SEM @ RUSB. No rubs, gallops RESPIRATORY:  Clear to auscultation without rales, wheezing or rhonchi  ABDOMEN: Soft, non-tender, non-distended EXTREMITIES:  No edema; No deformity.  Groin sites clear without hematoma or ecchymosis.    Disposition   Pt is being discharged home today in  good condition.  Follow-up Plans & Appointments     Follow-up Information     Sebastian Lamarr SAUNDERS, PA-C. Go on 05/11/2024.   Specialties: Cardiology, Radiology Why: @ 1pm, please arrive at least 20 minutes early. Contact information: [address] [place]  [place] [address] [phone]                  Discharge Medications   Allergies as of 05/04/2024       Reactions   Estrogens Other (See Comments)   Blood clots   Other Other (See Comments)   Unable to take birth control pills due to clotting disorder/fim   Prednisone Other (See Comments)   Facial edema - has had this 2020 and had no difficulty can take in small doses   Progesterone Other (See Comments)   Blood clots   Propofol  Other (See Comments)   Became disinhibited and exacerbated restless leg syndrome during EGD/colonoscopy. Patient has requested an alternate form of sedation during future procedures. Legs started thrashing and skin tears.   Lisinopril Cough   Clotrimazole Rash   Tape Rash, Other (See Comments)   Please use paper tape        Medication List     STOP taking these medications    heparin  5000 UNIT/ML injection       TAKE these medications    acetaminophen  500 MG tablet Commonly known as: TYLENOL  Take 500 mg by mouth every 6 (six) hours as needed for moderate pain.   Allergy Eye 0.025-0.3 % ophthalmic solution Generic drug: naphazoline-pheniramine Place 1-2 drops into both eyes 4 (four) times daily as needed for eye irritation or allergies.   amiodarone  200 MG tablet Commonly known as: PACERONE  Take 200 mg by mouth in the morning.   apixaban  5 MG Tabs tablet Commonly known as: Eliquis  Take 1 tablet (5 mg total) by mouth 2 (two) times daily.   atorvastatin  20 MG tablet Commonly known as: LIPITOR Take 20 mg by mouth every evening.   Breyna  160-4.5 MCG/ACT inhaler Generic drug: budesonide -formoterol  Inhale 2 puffs into the lungs 2 (two) times daily.    cetirizine 10 MG tablet Commonly known as: ZYRTEC Take 10 mg by mouth daily in the afternoon.   Epogen 2000 UNIT/ML injection Generic drug: epoetin alfa 2,000 Units 3 (three) times a week.   fluocinonide ointment 0.05 % Commonly known as: LIDEX Apply 1 Application topically 2 (two) times daily as needed (psoriasis).   gabapentin  100 MG capsule Commonly known as: NEURONTIN  Take 1 capsule (100 mg total) by mouth 4 (four) times daily. What changed:  how much to take when to take this additional instructions   Incruse Ellipta  62.5 MCG/ACT Aepb Generic drug: umeclidinium bromide  Inhale 1 puff into the lungs daily. What changed: when to take this   lanthanum  500 MG chewable tablet Commonly known as: FOSRENOL  Chew 500-1,000 mg by mouth See admin instructions. Take 1-2 tablets ([phone] mg) by mouth with each meal & take 1 tablet (500 mg) with snacks.   Medical Compression Stockings Misc by Does not apply route. Leg wraps.   multivitamin Tabs tablet Take 1 tablet by mouth daily in the afternoon.   neomycin-bacitracin-polymyxin Oint Commonly known as: NEOSPORIN Apply 1 Application topically as needed for wound care.   omeprazole 20 MG capsule Commonly known as: PRILOSEC Take 20 mg by mouth daily as needed (indigestion/heartburn.).   ondansetron  4 MG disintegrating tablet Commonly known as: ZOFRAN -ODT Take 4 mg by mouth every Monday, Wednesday, and Friday.   oxyCODONE  5 MG immediate release tablet Commonly known as: Roxicodone  Take one po qd formore severe pain What changed:  how much to take how to take this when to take this reasons to take this additional instructions   PARSABIV IV Inject 1 Dose  into the vein 3 (three) times a week. Receives at Dialysis   PRESCRIPTION MEDICATION Inhale into the lungs at bedtime. CPAP with nasal pillow   rOPINIRole  2 MG tablet Commonly known as: REQUIP  Take 1 tablet (2 mg total) by mouth at bedtime. What changed: Another  medication with the same name was changed. Make sure you understand how and when to take each.   rOPINIRole  0.5 MG tablet Commonly known as: REQUIP  TAKE 1-2 TABLETS BY MOUTH IN THE MORNING. What changed:  how much to take how to take this when to take this additional instructions   SODIUM THIOSULFATE IV Inject 1 Dose into the vein 2 (two) times a week. Receives at Dialysis   sulfaSALAzine  500 MG tablet Commonly known as: AZULFIDINE  Take 1,000 mg by mouth 2 (two) times daily.   traMADol  50 MG tablet Commonly known as: ULTRAM  TAKE 1 TABLET (50 MG TOTAL) BY MOUTH 2 (TWO) TIMES DAILY AS NEEDED (PAIN). What changed: when to take this   triamcinolone cream 0.1 % Commonly known as: KENALOG Apply 1 Application topically daily as needed (itching).         Outstanding Labs/Studies   none  ______________________  Duration of Discharge Encounter: APP Time: 20 minutes    Signed, Lamarr Hummer, PA-C 05/04/2024, 9:16 AM [phone]   ATTENDING ATTESTATION:  After conducting a review of all available clinical information with the care team, interviewing the patient, and performing a physical exam, I agree with the findings and plan described in this note.   GEN: No acute distress.   HEENT:  MMM, no JVD, no scleral icterus Cardiac: RRR, very soft systolic murmur  Respiratory: Very mild rales at bases bilaterally. GI: Soft, nontender, non-distended  MS: No edema; No deformity. Neuro:  Nonfocal  Vasc:  +2 radial pulses; access sites stable; left upper extremity fistula  Patient doing well after uncomplicated TAVR with a 29 mm Medtronic FX+ valve from the right transfemoral approach.  The patient was extubated last night to allow her access sites to heal.  She is doing very well this morning.  She feels much improved.  Her access sites are stable.  She has no signs or symptoms of stroke.  Her EKG demonstrates no new conduction deficits.  She does have a history of end-stage  renal disease and is due for dialysis today.  The patient would like to forego dialysis today and be discharged home and resume dialysis on Friday.  Because of this we will treat her with Lokelma .  She is not overtly volume overloaded.  Will follow-up echocardiogram today and plan on discharge with close hospital follow-up.  APP discharge time:20 MD discharge time:22  Lurena Red, MD Pager [phone]

## 2024-05-03 NOTE — Progress Notes (Signed)
  Echocardiogram 2D Echocardiogram has been performed.  LAMON MAXWELL 05/03/2024, 3:44 PM

## 2024-05-03 NOTE — Discharge Instructions (Signed)
 ACTIVITY AND EXERCISE  Daily activity and exercise are an important part of your recovery. People recover at different rates depending on their general health and type of valve procedure.  Most people recovering from TAVR feel better relatively quickly   No lifting, pushing, pulling more than 10 pounds (examples to avoid: groceries, vacuuming, gardening, golfing):             - For one week with a procedure through the groin.             - For six weeks for procedures through the chest wall or neck. NOTE: You will typically see one of our providers 7-14 days after your procedure to discuss WHEN TO RESUME the above activities.      DRIVING  Do not drive until you are seen for follow up and cleared by a provider. Generally, we ask patient to not drive for 1 week after their procedure.  If you have been told by your doctor in the past that you may not drive, you must talk with him/her before you begin driving again.   DRESSING  Groin site: you may leave the clear dressing over the site for up to one week or until it falls off.   HYGIENE  If you had a femoral (leg) procedure, you may take a shower when you return home. After the shower, pat the site dry. Do NOT use powder, oils or lotions in your groin area until the site has completely healed.  If you had a chest procedure, you may shower when you return home unless specifically instructed not to by your discharging practitioner.             - DO NOT scrub incision; pat dry with a towel.             - DO NOT apply any lotions, oils, powders to the incision.             - No tub baths / swimming for at least 2 weeks.  If you notice any fevers, chills, increased pain, swelling, bleeding or pus, please contact your doctor.   ADDITIONAL INFORMATION  If you are going to have an upcoming dental procedure, please contact our office as you will require antibiotics ahead of time to prevent infection on your heart valve.    If you have any questions  or concerns you can call the structural heart phone during normal business hours 8am-4pm. If you have an urgent need after hours or weekends please call [phone] to talk to the on call provider for general cardiology. If you have an emergency that requires immediate attention, please call 911.    After TAVR Checklist  Check  Test Description   Follow up appointment in 1-2 weeks  You will see our structural heart advanced practice provider. Your incision sites will be checked and you will be cleared to drive and resume all normal activities if you are doing well.     1 month echo and follow up  You will have an echo to check on your new heart valve and be seen back in the office by a structural heart advanced practice provider.   Follow up with your primary cardiologist You will need to be seen by your primary cardiologist in the following 3-6 months after your 1 month appointment in the valve clinic.    1 year echo and follow up You will have another echo to check on your heart valve after 1 year  and be seen back in the office by a structural heart advanced practice provider. This your last structural heart visit.   Bacterial endocarditis prophylaxis  You will have to take antibiotics for the rest of your life before all dental procedures (even teeth cleanings) to protect your heart valve. Antibiotics are also required before some surgeries. Please check with your cardiologist before scheduling any surgeries. Also, please make sure to tell us  if you have a penicillin allergy as you will require an alternative antibiotic.

## 2024-05-03 NOTE — Progress Notes (Signed)
  [facility]   MULTIDISCIPLINARY HEART VALVE TEAM  Patient doing well s/p TAVR. She is hemodynamically stable but intubated and sedated. Groin sites stable, left with fem stop in place. Some mild leg discoloration but pedal pulses are palpable. PCCM to extubate her in 4 hours after bedrest due to severe restless leg syndrome. Possible HD tomorrow in hospital but nephrology said they are very backed up and might not be done until very late in day or night.   Lamarr Hummer PA-C  [facility]  Pager [phone]

## 2024-05-03 NOTE — Plan of Care (Signed)
  Problem: Clinical Measurements: Goal: Will remain free from infection Outcome: Progressing   Problem: Pain Managment: Goal: General experience of comfort will improve and/or be controlled Outcome: Progressing   Problem: Safety: Goal: Ability to remain free from injury will improve Outcome: Progressing   Problem: Skin Integrity: Goal: Risk for impaired skin integrity will decrease Outcome: Progressing

## 2024-05-03 NOTE — Op Note (Signed)
 [facility]  TAVR OPERATIVE NOTE   Date of Procedure:  05/03/2024  Preoperative Diagnosis: Severe Aortic Stenosis   Postoperative Diagnosis: Same   Procedure:   Transcatheter Aortic Valve Replacement - Transfemoral Approach  Medtronic Evolut FX+ (size 29 mm)   Co-Surgeons:  Lurena Red, MD and Linnie Rayas, MD  Anesthesiologist:  Epifanio  Echocardiographer:  O'Neal  Pre-operative Echo Findings: Severe aortic stenosis Normal left ventricular systolic function  Post-operative Echo Findings: No paravalvular leak Normal left ventricular systolic function  Left Hearth Catheterization:  LVEDP      BRIEF CLINICAL NOTE AND INDICATIONS FOR SURGERY  The patient is a 68 year old female with a history of end-stage renal disease, severe restless leg syndrome, factor V Leiden heterozygous and prothrombin 2 mutation on chronic Eliquis , paroxysmal atrial fibrillation, hyperlipidemia, hypertension, OSA, and severe symptomatic aortic stenosis who was referred for elective transcatheter aortic valve replacement with a 29 mm Evolut FX+ from the right transfemoral approach.  During the course of the patient's preoperative work up they have been evaluated comprehensively by a multidisciplinary team of specialists coordinated through the Multid[facility] in the [facility].  They have been demonstrated to suffer from symptomatic severe aortic stenosis as noted above. The patient has been counseled extensively as to the relative risks and benefits of all options for the treatment of severe aortic stenosis including long term medical therapy, conventional surgery for aortic valve replacement, and transcatheter aortic valve replacement.  The patient has been independently evaluated by Dr. Rayas  with CT surgery and they are felt to be at high risk for conventional surgical aortic valve replacement. The surgeon indicated the  patient would be a poor candidate for conventional surgery. Based upon review of all of the patient's preoperative diagnostic tests they are felt to be candidate for transcatheter aortic valve replacement using the transfemoral approach as an alternative to high risk conventional surgery.    Following the decision to proceed with transcatheter aortic valve replacement, a discussion has been held regarding what types of management strategies would be attempted intraoperatively in the event of life-threatening complications, including whether or not the patient would be considered a candidate for the use of cardiopulmonary bypass and/or conversion to open sternotomy for attempted surgical intervention.  The patient has been advised of a variety of complications that might develop peculiar to this approach including but not limited to risks of death, stroke, paravalvular leak, aortic dissection or other major vascular complications, aortic annulus rupture, device embolization, cardiac rupture or perforation, acute myocardial infarction, arrhythmia, heart block or bradycardia requiring permanent pacemaker placement, congestive heart failure, respiratory failure, renal failure, pneumonia, infection, other late complications related to structural valve deterioration or migration, or other complications that might ultimately cause a temporary or permanent loss of functional independence or other long term morbidity.  The patient provides full informed consent for the procedure as described and all questions were answered preoperatively.    DETAILS OF THE OPERATIVE PROCEDURE  PREPARATION:   The patient is brought to the operating room on the above mentioned date and central monitoring was established by the anesthesia team including placement of a radial arterial line. The patient is placed in the supine position on the operating table.  Intravenous antibiotics are administered. Conscious sedation is used.    Baseline transthoracic echocardiogram was performed. The patient's chest, abdomen, both groins, and both lower extremities are prepared and draped in a sterile manner. A time out procedure  is performed.   PERIPHERAL ACCESS:   Using the modified Seldinger technique, femoral arterial and venous access were obtained with placement of a 6 Fr sheath in the left artery using u/s guidance.  A pigtail diagnostic catheter was passed through the femoral arterial sheath under fluoroscopic guidance into the aortic root.  Aortic root angiography was performed in order to determine the optimal angiographic angle for valve deployment.  TRANSFEMORAL ACCESS:  A micropuncture kit was used to gain access to the right femoral artery using u/s guidance. Position confirmed with angiography. Pre-closure with double ProGlide closure devices. The patient was heparinized systemically and ACT verified > 250 seconds.    A 18 Fr transfemoral Check-Flo sheath was introduced into the right femoral artery over an Amplatz superstiff wire. An AL-1 catheter was used to direct a straight-tip exchange length wire across the native aortic valve into the left ventricle. This was exchanged out for a pigtail catheter and position was confirmed in the LV apex. Simultaneous LV and Ao pressures were recorded.  The pigtail catheter was then exchanged for an Safari wire in the LV apex. Direct LV pacing thresholds were tested and found to be adequate.  TRANSCATHETER HEART VALVE DEPLOYMENT:  A balloon aortic valvuloplasty with a 20 mm true balloon was performed with direct left ventricular pacing.  A Medtronic Evolut FX+ THV size 29 mm was prepared and per manufacturer's guidelines, and the proper orientation of the valve is confirmed on the delivery system.  The valve was advanced through the introducer sheath into the descending aorta. The valve was then advanced across the aortic arch. The valve was carefully positioned across the aortic valve  annulus. Once final position of the valve has been confirmed with angiographic assessment in the cusp overlap view and in the LAO view, the valve is deployed with controlled rapid pacing. The valve is taken to 80% deployment and appropriate depth is confirmed. The valve is then released from each paddle. There is no valvular leak and no central aortic insufficiency.  The patient's hemodynamic recovery following valve deployment is good.  Echo demostrated acceptable post-procedural gradients, stable mitral valve function, and no AI.   PROCEDURE COMPLETION:  The sheath was then removed and closure devices were completed. Protamine  was administered once femoral arterial repair was complete. The pigtail catheters and femoral sheaths were removed with a Mynx closure device placed in the artery and manual pressure used for venous hemostasis.  The patient will be maintained under general anesthesia for the next 4 hours given her restless leg syndrome.  Given possible hematoma formation of the left femoral site FemoStop was placed and will be weaned over the next few hours.  The patient tolerated the procedure well and is transported to the surgical intensive care in stable condition. There were no immediate intraoperative complications. All sponge instrument and needle counts are verified correct at completion of the operation.   No blood products were administered during the operation.  The patient received a total of 60 mL of intravenous contrast during the procedure.  Derico Mitton K Miguelangel Korn MD 05/03/2024 4:15 PM

## 2024-05-03 NOTE — Progress Notes (Signed)
 Placed patient on CPAP for the night with  home settings of 11cmH20

## 2024-05-03 NOTE — Op Note (Signed)
 Signed       [facility]   MULTIDISCIPLINARY HEART VALVE TEAM     TAVR OPERATIVE NOTE     Date of Procedure:                05/03/2024   Preoperative Diagnosis:      Severe Aortic Stenosis    Postoperative Diagnosis:    Same    Procedure:        Transcatheter Aortic Valve Replacement - Percutaneous *** Transfemoral Approach             ***              Co-Surgeons:                        Linnie Rayas, MD and ***, MD   Anesthesiologist:                  General   Echocardiographer:              ***, MD   Pre-operative Echo Findings: Severe aortic stenosis normal left ventricular systolic function ***   Post-operative Echo Findings: *** paravalvular leak normal left ventricular systolic function ***     BRIEF CLINICAL NOTE AND INDICATIONS FOR SURGERY   ***       DETAILS OF THE OPERATIVE PROCEDURE   PREPARATION:     The patient was brought to the operating room on the above mentioned date and appropriate monitoring was established by the anesthesia team. The patient was placed in the supine position on the operating table.  Intravenous antibiotics were administered. The patient was monitored closely throughout the procedure under conscious sedation.   Baseline transthoracic echocardiogram was performed. The patient's abdomen and both groins were prepped and draped in a sterile manner. A time out procedure was performed.     PERIPHERAL ACCESS:     Using the modified Seldinger technique, femoral arterial was obtained with placement of 6 Fr sheath on the *** side.  A pigtail diagnostic catheter was passed through the arterial sheath under fluoroscopic guidance into the aortic root. Venous access was from the ***vein.  A temporary transvenous pacemaker catheter was passed through the venous sheath under fluoroscopic guidance into the right ventricle.  The pacemaker was tested to ensure stable lead placement and pacemaker capture. Aortic root  angiography was performed in order to determine the optimal angiographic angle for valve deployment.     TRANSFEMORAL ACCESS:    Percutaneous transfemoral access and sheath placement was performed using ultrasound guidance.  The *** common femoral artery was cannulated using a micropuncture needle and appropriate location was verified using hand injection angiogram.  A pair of Abbott Perclose percutaneous closure devices were placed and a 6 French sheath replaced into the femoral artery.  The patient was heparinized systemically and ACT verified > 250 seconds.     A 14 Fr transfemoral E-sheath was introduced into the *** common femoral artery after progressively dilating over an Amplatz superstiff wire. An pigtail catheter was used to direct a straight-tip exchange length wire across the native aortic valve into the left ventricle. This was exchanged out for a pigtail catheter and position was confirmed in the LV apex. Simultaneous LV and Ao pressures were recorded.  The pigtail catheter was exchanged for a Safari wire in the LV apex.         TRANSCATHETER HEART VALVE DEPLOYMENT:    An ***  transcatheter heart valve (  size *** mm) was prepared and crimped per manufacturer's guidelines, and the proper orientation of the valve is confirmed on the [employer] delivery system. The valve was advanced through the introducer sheath using normal technique until in an appropriate position in the abdominal aorta beyond the sheath tip. The balloon was then retracted and using the fine-tuning wheel was centered on the valve. The valve was then advanced across the aortic arch using appropriate flexion of the catheter. The valve was carefully positioned across the aortic valve annulus. The Commander catheter was retracted using normal technique. Once final position of the valve has been confirmed by angiographic assessment, the valve is deployed during rapid ventricular pacing to maintain systolic blood  pressure < 50 mmHg and pulse pressure < 10 mmHg. The balloon inflation is held for >3 seconds after reaching full deployment volume. Once the balloon has fully deflated the balloon is retracted into the ascending aorta and valve function is assessed using echocardiography. ***There is felt to be trace paravalvular leak and no central aortic insufficiency.  The patient's hemodynamic recovery following valve deployment is good.  The deployment balloon and guidewire are both removed.      PROCEDURE COMPLETION:    The sheath was removed and femoral artery closure performed.  Protamine  was administered once femoral arterial repair was complete. The temporary pacemaker***, pigtail catheter and femoral sheaths were removed with manual pressure used for venous hemostasis.  ***A Mynx femoral closure device was utilized following removal of the diagnostic sheath in the *** femoral artery.   The patient tolerated the procedure well and is transported to the cath lab recovery area in stable condition. There were no immediate intraoperative complications. All sponge instrument and needle counts are verified correct at completion of the operation.    No blood products were administered during the operation.   The patient received a total of *** mL of intravenous contrast during the procedure.

## 2024-05-03 NOTE — Transfer of Care (Addendum)
 Immediate Anesthesia Transfer of Care Note  Patient: Whitney Chavez  Procedure(s) Performed: Transcatheter Aortic Valve Replacement, Transfemoral ECHOCARDIOGRAM, TRANSTHORACIC  Patient Location: SICU  Anesthesia Type:General  Level of Consciousness: Patient remains intubated per anesthesia plan  Airway & Oxygen Therapy: Patient remains intubated per anesthesia plan  Post-op Assessment: Report given to RN and Post -op Vital signs reviewed and stable  Post vital signs: Reviewed and stable  Last Vitals:  Vitals Value Taken Time  BP 120/80   Temp 98   Pulse 80   Resp 16   SpO2 100     Last Pain:  Vitals:   05/03/24 1531  TempSrc:   PainSc: Asleep      Patients Stated Pain Goal: 0 (05/03/24 1101)  Complications: There were no known notable events for this encounter.

## 2024-05-03 NOTE — Interval H&P Note (Signed)
 History and Physical Interval Note:  05/03/2024 11:20 AM  Whitney Chavez  has presented today for surgery, with the diagnosis of Severe Aortic Stenosis.  The various methods of treatment have been discussed with the patient and family. After consideration of risks, benefits and other options for treatment, the patient has consented to  Procedure(s): Transcatheter Aortic Valve Replacement, Transfemoral (N/A) ECHOCARDIOGRAM, TRANSTHORACIC (N/A) as a surgical intervention.  The patient's history has been reviewed, patient examined, no change in status, stable for surgery.  I have reviewed the patient's chart and labs.  Questions were answered to the patient's satisfaction.     [name]

## 2024-05-03 NOTE — Progress Notes (Signed)
 PHARMACY NOTE:  ANTIMICROBIAL RENAL DOSAGE ADJUSTMENT  Current antimicrobial regimen includes a mismatch between antimicrobial dosage and estimated renal function.  As per policy approved by the Pharmacy & Therapeutics and Medical Executive Committees, the antimicrobial dosage will be adjusted accordingly.  Current antimicrobial dosage:  Ancef  2gm IV q8h x 2   Indication: surgical prophylaxis  Renal Function:  Estimated Creatinine Clearance: 9.7 mL/min (A) (by C-G formula based on SCr of 5.3 mg/dL (H)). [x]      On intermittent HD    Antimicrobial dosage has been changed to:  No post-op Ancef . Pre-op dose will cover 24h post op period.  Thank you for allowing pharmacy to be a part of this patient's care.  Vito Ralph, PharmD, BCPS Please see amion for complete clinical pharmacist phone list 05/03/2024 4:37 PM

## 2024-05-03 NOTE — Consult Note (Addendum)
 NAME:  Whitney Chavez, MRN:  [id number], DOB:  06-Jun-1956, LOS: 0 ADMISSION DATE:  05/03/2024, CONSULTATION DATE:  05/03/24 REFERRING MD:  Shyrl, CHIEF COMPLAINT:  TAVR, RLS   History of Present Illness:  Whitney Chavez is a 68 y.o. F with PMH significant for ESRD on dialysis for over 73yrs, severe AS, Asthma, PAF, Calciphylaxis, HTN, Factor V leiden on Eliquis , COPD,OSA, RLS on requip  who presented for TAVR.  This procedure was uncomplicated, however on extubation post-op she was moving the legs and required placement of fem-stop and re-intubation to ensure no extreme movement of the lower extremity for 4hrs post-op   Pertinent  Medical History   has a past medical history of Asthma, Axillary vein thrombosis (HCC) (08/16/2015), Calciphylaxis, Chronic kidney disease, Colitis, COPD (chronic obstructive pulmonary disease) (HCC), Factor V Leiden (HCC), Hypertension, Lymphedema, MRSA (methicillin resistant staph aureus) culture positive, Neuropathy, Obstructive sleep apnea (03/29/2015), OSA on CPAP, S/P TAVR (transcatheter aortic valve replacement) (05/03/2024), Severe aortic stenosis, and Vision abnormalities.   Significant Hospital Events: Including procedures, antibiotic start and stop dates in addition to other pertinent events   8/19 TAVR,re-intubated after moving LE and requiring fem-stop  Interim History / Subjective:  Presented to the ICU on propofol  and levophed   Objective    Blood pressure (!) 157/65, pulse 74, temperature (!) 96.3 F (35.7 C), temperature source Axillary, resp. rate 20, height 5' (1.524 m), weight 81.6 kg, SpO2 100%.    Vent Mode: PRVC FiO2 (%):  [40 %] 40 % Set Rate:  [20 bmp] 20 bmp Vt Set:  [360 mL] 360 mL PEEP:  [5 cmH20] 5 cmH20 Plateau Pressure:  [12 cmH20] 12 cmH20   Intake/Output Summary (Last 24 hours) at 05/03/2024 1719 Last data filed at 05/03/2024 1700 Gross per 24 hour  Intake 162.29 ml  Output --  Net 162.29 ml   Filed Weights    05/03/24 1058  Weight: 81.6 kg    General:  intubated and sedated F in NAD HEENT: MM pink/moist,ETT in place  Neuro: examined on propofol ,RASS -2 CV: s1s2 rrr, no m/r/g PULM:  clear bilaterally on PRVC GI: soft, obese Extremities: warm/dry, LLE distal to femstop mottled but warm with good pulses  Post-op labs pending   Resolved problem list   Assessment and Plan    Severe Aortic Stenosis s/p TAVR RLS Hypocalcemia  Paroxysmal Atrial Fibrillation HL Asthma  Post-op ventilator management   -transported to the ICU stable and intubated, plan to keep intubated and LLE straight for 4 hrs (2000) and then can follow rapid wean protocol  -monitor the LLE, warm with good pulses,femstop in place -follow post-op labs and electrolytes  -propofol  and fentanyl  for PAD -titrate Vent setting to maintain SpO2 greater than or equal to 90%. -HOB elevated 30 degrees. -Plateau pressures less than 30 cm H20.  -resume home Requip   -continue home Incruse -hold amiodarone  for now  -now off levophed  and requiring nitroglycerin  infusion for HTN   Best Practice (right click and Reselect all SmartList Selections daily)   Diet/type: NPO DVT prophylaxis SCD Pressure ulcer(s): N/A GI prophylaxis: H2B Lines: N/A Foley:  N/A Code Status:  full code Last date of multidisciplinary goals of care discussion [pending]  Labs   CBC: Recent Labs  Lab 04/29/24 1200 05/03/24 1144  WBC 5.4  --   HGB 11.2* 13.3  HCT 36.4 39.0  MCV 90.1  --   PLT 153  --     Basic Metabolic Panel: Recent Labs  Lab 04/29/24  1200 05/03/24 1144  NA 137 135  K 3.6 5.6*  CL 99 100  CO2 23  --   GLUCOSE 94 68*  BUN 12 36*  CREATININE 2.80* 5.30*  CALCIUM  9.2  --    GFR: Estimated Creatinine Clearance: 9.7 mL/min (A) (by C-G formula based on SCr of 5.3 mg/dL (H)). Recent Labs  Lab 04/29/24 1200  WBC 5.4    Liver Function Tests: Recent Labs  Lab 04/29/24 1200  AST 17  ALT 8  ALKPHOS 72  BILITOT  0.8  PROT 7.2  ALBUMIN 3.5   No results for input(s): LIPASE, AMYLASE in the last 168 hours. No results for input(s): AMMONIA in the last 168 hours.  ABG    Component Value Date/Time   PHART 7.360 03/29/2024 0828   PCO2ART 40.8 03/29/2024 0828   PO2ART 81 (L) 03/29/2024 0828   HCO3 23.0 03/29/2024 0828   TCO2 25 05/03/2024 1144   ACIDBASEDEF 2.0 03/29/2024 0828   O2SAT 95 03/29/2024 0828     Coagulation Profile: Recent Labs  Lab 04/29/24 1200  INR 1.2    Cardiac Enzymes: No results for input(s): CKTOTAL, CKMB, CKMBINDEX, TROPONINI in the last 168 hours.  HbA1C: No results found for: HGBA1C  CBG: No results for input(s): GLUCAP in the last 168 hours.  Review of Systems:   Unable to obtain  Past Medical History:  She,  has a past medical history of Asthma, Axillary vein thrombosis (HCC) (08/16/2015), Calciphylaxis, Chronic kidney disease, Colitis, COPD (chronic obstructive pulmonary disease) (HCC), Factor V Leiden (HCC), Hypertension, Lymphedema, MRSA (methicillin resistant staph aureus) culture positive, Neuropathy, Obstructive sleep apnea (03/29/2015), OSA on CPAP, S/P TAVR (transcatheter aortic valve replacement) (05/03/2024), Severe aortic stenosis, and Vision abnormalities.   Surgical History:   Past Surgical History:  Procedure Laterality Date   APPLICATION OF WOUND VAC     AV FISTULA PLACEMENT Left    BIOPSY  11/05/2022   Procedure: BIOPSY;  Surgeon: Burnette Fallow, MD;  Location: [facility];  Service: Gastroenterology;;   COLONOSCOPY WITH PROPOFOL  Bilateral 11/05/2022   Procedure: COLONOSCOPY WITH PROPOFOL ;  Surgeon: Burnette Fallow, MD;  Location: [facility];  Service: Gastroenterology;  Laterality: Bilateral;   CORONARY ANGIOGRAPHY N/A 03/29/2024   Procedure: CORONARY ANGIOGRAPHY;  Surgeon: Wendel Lurena POUR, MD;  Location: [facility] IN[facility]AS[facility] CV [facility];  Service: Cardiovascular;  Laterality: N/A;   ESOPHAGOGASTRODUODENOSCOPY (EGD) WITH PROPOFOL   Bilateral 03/19/2022   Procedure: ESOPHAGOGASTRODUODENOSCOPY (EGD) WITH PROPOFOL ;  Surgeon: Burnette Fallow, MD;  Location: [facility];  Service: Gastroenterology;  Laterality: Bilateral;   LOWER EXTREMITY ANGIOGRAPHY  03/29/2024   Procedure: Lower Extremity Angiography;  Surgeon: Wendel Lurena POUR, MD;  Location: [facility] INVASIVE CV LAB;  Service: Cardiovascular;;   lumbar decompression fusion     TONSILLECTOMY     UPPER EXTREMITY VENOGRAPHY  03/29/2024   Procedure: UPPER EXTREMITY VENOGRAPHY;  Surgeon: Wendel Lurena POUR, MD;  Location: [facility] INVASIVE CV LAB;  Service: Cardiovascular;;     Social History:   reports that she quit smoking about 25 years ago. Her smoking use included cigarettes. She started smoking about 55 years ago. She has a 60 pack-year smoking history. She has never used smokeless tobacco. She reports that she does not drink alcohol and does not use drugs.   Family History:  Her family history includes Alcohol abuse in her brother; COPD in her mother; Cancer in her brother; Congestive Heart Failure in her mother; Diabetes in her brother, brother, and mother; Heart disease in her father; Neuropathy in her  mother; Stroke in her father and mother.   Allergies Allergies  Allergen Reactions   Estrogens Other (See Comments)    Blood clots   Other Other (See Comments)    Unable to take birth control pills due to clotting disorder/fim   Prednisone Other (See Comments)    Facial edema - has had this 2020 and had no difficulty can take in small doses   Progesterone Other (See Comments)    Blood clots   Propofol  Other (See Comments)    Became disinhibited and exacerbated restless leg syndrome during EGD/colonoscopy. Patient has requested an alternate form of sedation during future procedures.  Legs started thrashing and skin tears.   Lisinopril Cough   Clotrimazole Rash   Tape Rash and Other (See Comments)    Please use paper tape     Home Medications  Prior to Admission  medications   Medication Sig Start Date End Date Taking? Authorizing Provider  acetaminophen  (TYLENOL ) 500 MG tablet Take 500 mg by mouth every 6 (six) hours as needed for moderate pain.   Yes [provider]  amiodarone  (PACERONE ) 200 MG tablet Take 200 mg by mouth in the morning. 12/28/18  Yes [provider]  apixaban  (ELIQUIS ) 5 MG TABS tablet Take 1 tablet (5 mg total) by mouth 2 (two) times daily. 10/13/22 04/27/27 Yes Ennever, Maude SAUNDERS, MD  atorvastatin  (LIPITOR) 20 MG tablet Take 20 mg by mouth every evening. 01/03/19  Yes [provider]  BREYNA  160-4.5 MCG/ACT inhaler Inhale 2 puffs into the lungs 2 (two) times daily. 09/04/23  Yes Kassie Acquanetta Bradley, MD  cetirizine (ZYRTEC) 10 MG tablet Take 10 mg by mouth daily in the afternoon.   Yes [provider]  epoetin alfa (EPOGEN) 2000 UNIT/ML injection 2,000 Units 3 (three) times a week.   Yes [provider]  Etelcalcetide HCl (PARSABIV IV) Inject 1 Dose into the vein 3 (three) times a week. Receives at Dialysis   Yes [provider]  fluocinonide ointment (LIDEX) 0.05 % Apply 1 Application topically 2 (two) times daily as needed (psoriasis). 03/10/24  Yes [provider]  gabapentin  (NEURONTIN ) 100 MG capsule Take 1 capsule (100 mg total) by mouth 4 (four) times daily. Patient taking differently: Take 100-200 mg by mouth 2 (two) times daily. In the afternoon and in the evening 08/04/23  Yes Lomax, Amy, NP  heparin  5000 UNIT/ML injection Inject 1 mL (5,000 Units total) into the skin 2 (two) times daily. To be given on August 15, 16 and 17th prior to surgery. 04/25/24  Yes Timmy Maude SAUNDERS, MD  lanthanum  (FOSRENOL ) 500 MG chewable tablet Chew 500-1,000 mg by mouth See admin instructions. Take 1-2 tablets ([phone] mg) by mouth with each meal & take 1 tablet (500 mg) with snacks.   Yes [provider]  multivitamin (RENA-VIT) TABS tablet Take 1 tablet by mouth daily in the afternoon.    Yes [provider]  naphazoline-pheniramine (ALLERGY EYE) 0.025-0.3 % ophthalmic solution Place 1-2 drops into both eyes 4 (four) times daily as needed for eye irritation or allergies.   Yes [provider]  neomycin-bacitracin-polymyxin (NEOSPORIN) OINT Apply 1 Application topically as needed for wound care.   Yes [provider]  omeprazole (PRILOSEC) 20 MG capsule Take 20 mg by mouth daily as needed (indigestion/heartburn.).   Yes [provider]  ondansetron  (ZOFRAN -ODT) 4 MG disintegrating tablet Take 4 mg by mouth every Monday, Wednesday, and Friday.   Yes [provider]  oxyCODONE  (  ROXICODONE ) 5 MG immediate release tablet Take one po qd formore severe pain Patient taking differently: Take 5 mg by mouth daily as needed (severe pain.). 12/14/23  Yes Sater, Charlie LABOR, MD  rOPINIRole  (REQUIP ) 0.5 MG tablet TAKE 1-2 TABLETS BY MOUTH IN THE MORNING. Patient taking differently: Take 0.5 mg by mouth daily in the afternoon. 08/04/23  Yes Lomax, Amy, NP  rOPINIRole  (REQUIP ) 2 MG tablet Take 1 tablet (2 mg total) by mouth at bedtime. 08/04/23  Yes Lomax, Amy, NP  SODIUM THIOSULFATE IV Inject 1 Dose into the vein 2 (two) times a week. Receives at Dialysis   Yes [provider]  sulfaSALAzine  (AZULFIDINE ) 500 MG tablet Take 1,000 mg by mouth 2 (two) times daily. 02/15/15  Yes [provider]  traMADol  (ULTRAM ) 50 MG tablet TAKE 1 TABLET (50 MG TOTAL) BY MOUTH 2 (TWO) TIMES DAILY AS NEEDED (PAIN). Patient taking differently: Take 50 mg by mouth 2 (two) times daily. 12/17/23  Yes Sater, Charlie LABOR, MD  triamcinolone cream (KENALOG) 0.1 % Apply 1 Application topically daily as needed (itching). 08/31/18  Yes [provider]  umeclidinium bromide  (INCRUSE ELLIPTA ) 62.5 MCG/ACT AEPB Inhale 1 puff into the lungs daily. Patient taking differently: Inhale 1 puff into the lungs daily in the afternoon. 09/03/23  Yes Kassie Acquanetta Bradley, MD  Elastic  Bandages & Supports (MEDICAL COMPRESSION STOCKINGS) MISC by Does not apply route. Leg wraps.    [provider]  PRESCRIPTION MEDICATION Inhale into the lungs at bedtime. CPAP with nasal pillow    [provider]     Critical care time:  45 minutes        CRITICAL CARE Performed by: Leita SAUNDERS Coley Kulikowski   Total critical care time: 45 minutes  Critical care time was exclusive of separately billable procedures and treating other patients.  Critical care was necessary to treat or prevent imminent or life-threatening deterioration.  Critical care was time spent personally by me on the following activities: development of treatment plan with patient and/or surrogate as well as nursing, discussions with consultants, evaluation of patient's response to treatment, examination of patient, obtaining history from patient or surrogate, ordering and performing treatments and interventions, ordering and review of laboratory studies, ordering and review of radiographic studies, pulse oximetry and re-evaluation of patient's condition.   Leita SAUNDERS Montserrath Madding, PA-C [facility] See Amion for pager If no response to pager , please call [phone] until 7pm After 7:00 pm call Elink  [phone]   ,

## 2024-05-03 NOTE — Progress Notes (Addendum)
 At bedside for PIV insertion. Stuck x 3 without success. Vessels bifurcating and blowing. RN aware. Left arm restriction. Recommend central access for further vascular needs.

## 2024-05-04 ENCOUNTER — Inpatient Hospital Stay (HOSPITAL_COMMUNITY)

## 2024-05-04 ENCOUNTER — Encounter (HOSPITAL_COMMUNITY): Payer: Self-pay | Admitting: Internal Medicine

## 2024-05-04 DIAGNOSIS — N186 End stage renal disease: Secondary | ICD-10-CM | POA: Diagnosis not present

## 2024-05-04 DIAGNOSIS — Z952 Presence of prosthetic heart valve: Secondary | ICD-10-CM

## 2024-05-04 DIAGNOSIS — Z992 Dependence on renal dialysis: Secondary | ICD-10-CM

## 2024-05-04 DIAGNOSIS — I480 Paroxysmal atrial fibrillation: Secondary | ICD-10-CM | POA: Diagnosis not present

## 2024-05-04 LAB — BASIC METABOLIC PANEL WITH GFR
Anion gap: 18 — ABNORMAL HIGH (ref 5–15)
BUN: 34 mg/dL — ABNORMAL HIGH (ref 8–23)
CO2: 21 mmol/L — ABNORMAL LOW (ref 22–32)
Calcium: 7.8 mg/dL — ABNORMAL LOW (ref 8.9–10.3)
Chloride: 96 mmol/L — ABNORMAL LOW (ref 98–111)
Creatinine, Ser: 5.75 mg/dL — ABNORMAL HIGH (ref 0.44–1.00)
GFR, Estimated: 8 mL/min — ABNORMAL LOW (ref >60)
Glucose, Bld: 81 mg/dL (ref 70–99)
Potassium: 4.7 mmol/L (ref 3.5–5.1)
Sodium: 135 mmol/L (ref 135–145)

## 2024-05-04 LAB — CBC
HCT: 33.2 % — ABNORMAL LOW (ref 36.0–46.0)
Hemoglobin: 10.3 g/dL — ABNORMAL LOW (ref 12.0–15.0)
MCH: 28.5 pg (ref 26.0–34.0)
MCHC: 31 g/dL (ref 30.0–36.0)
MCV: 92 fL (ref 80.0–100.0)
Platelets: 159 K/uL (ref 150–400)
RBC: 3.61 MIL/uL — ABNORMAL LOW (ref 3.87–5.11)
RDW: 17.2 % — ABNORMAL HIGH (ref 11.5–15.5)
WBC: 8.1 K/uL (ref 4.0–10.5)
nRBC: 0 % (ref 0.0–0.2)

## 2024-05-04 LAB — POCT I-STAT, CHEM 8
BUN: 25 mg/dL — ABNORMAL HIGH (ref 8–23)
Calcium, Ion: 0.9 mmol/L — ABNORMAL LOW (ref 1.15–1.40)
Chloride: 102 mmol/L (ref 98–111)
Creatinine, Ser: 5 mg/dL — ABNORMAL HIGH (ref 0.44–1.00)
Glucose, Bld: 69 mg/dL — ABNORMAL LOW (ref 70–99)
HCT: 35 % — ABNORMAL LOW (ref 36.0–46.0)
Hemoglobin: 11.9 g/dL — ABNORMAL LOW (ref 12.0–15.0)
Potassium: 4.4 mmol/L (ref 3.5–5.1)
Sodium: 135 mmol/L (ref 135–145)
TCO2: 23 mmol/L (ref 22–32)

## 2024-05-04 LAB — ECHOCARDIOGRAM COMPLETE
AV Mean grad: 15.5 mmHg
AV Peak grad: 29.9 mmHg
Ao pk vel: 2.73 m/s
Area-P 1/2: 2.45 cm2
Height: 60 in
S' Lateral: 2.7 cm
Weight: 2880 [oz_av]

## 2024-05-04 LAB — TRIGLYCERIDES: Triglycerides: 91 mg/dL (ref <150)

## 2024-05-04 LAB — MAGNESIUM: Magnesium: 2.1 mg/dL (ref 1.7–2.4)

## 2024-05-04 MED ORDER — SODIUM ZIRCONIUM CYCLOSILICATE 10 G PO PACK
10.0000 g | PACK | ORAL | Status: AC
  Administered 2024-05-04: 10 g via ORAL
  Filled 2024-05-04: qty 1

## 2024-05-04 MED ORDER — FLUTICASONE FUROATE-VILANTEROL 100-25 MCG/ACT IN AEPB
1.0000 | INHALATION_SPRAY | Freq: Every day | RESPIRATORY_TRACT | Status: DC
  Filled 2024-05-04: qty 28

## 2024-05-04 MED ORDER — SODIUM ZIRCONIUM CYCLOSILICATE 10 G PO PACK: 10.0000 g | PACK | Freq: Once | ORAL | Status: DC

## 2024-05-04 MED ORDER — CHLORHEXIDINE GLUCONATE CLOTH 2 % EX PADS: 6.0000 | MEDICATED_PAD | Freq: Every day | CUTANEOUS | Status: DC

## 2024-05-04 MED ORDER — SODIUM ZIRCONIUM CYCLOSILICATE 10 G PO PACK
10.0000 g | PACK | Freq: Once | ORAL | Status: DC
  Filled 2024-05-04: qty 1

## 2024-05-04 NOTE — Progress Notes (Signed)
 Discussed with pt restrictions, walking as tolerated at home, and CRPII. Pt receptive. She will work on walking but CRPII would be difficult since she has HD MWF. She is interested in outpatient PT to help get stronger. Encouraged her to discuss with PCP.  [phone] Aliene Aris BS, ACSM-CEP 05/04/2024 9:16 AM

## 2024-05-04 NOTE — Progress Notes (Signed)
 Contacted by nephrologist regarding pt's d/c today. Pt receives out-pt HD at [facility] in [place] on MWF 1st shift. Charge RN at clinic aware pt will d/c today and that pt may be contacting clinic to request an appt for later today or tomorrow. Charge RN states clinic should be able to work pt in for an appt if/when pt calls, Navigator will fax d/c summary to clinic per their request for continuation of care.   Randine Mungo Dialysis Navigator [phone]

## 2024-05-04 NOTE — Consult Note (Signed)
 NAME:  Whitney Chavez, MRN:  [id number], DOB:  09-11-1956, LOS: 1 ADMISSION DATE:  05/03/2024, CONSULTATION DATE:  05/04/24 REFERRING MD:  Shyrl, CHIEF COMPLAINT:  TAVR, RLS   History of Present Illness:  Whitney Chavez is a 68 y.o. F with PMH significant for ESRD on dialysis for over 44yrs, severe AS, Asthma, PAF, Calciphylaxis, HTN, Factor V leiden on Eliquis , COPD,OSA, RLS on requip  who presented for TAVR.  This procedure was uncomplicated, however on extubation post-op she was moving the legs and required placement of fem-stop and re-intubation to ensure no extreme movement of the lower extremity for 4hrs post-op   Pertinent  Medical History   has a past medical history of Asthma, Axillary vein thrombosis (HCC) (08/16/2015), Calciphylaxis, Chronic kidney disease, Colitis, COPD (chronic obstructive pulmonary disease) (HCC), Factor V Leiden (HCC), Hypertension, Lymphedema, MRSA (methicillin resistant staph aureus) culture positive, Neuropathy, Obstructive sleep apnea (03/29/2015), OSA on CPAP, S/P TAVR (transcatheter aortic valve replacement) (05/03/2024), Severe aortic stenosis, and Vision abnormalities.   Significant Hospital Events: Including procedures, antibiotic start and stop dates in addition to other pertinent events   8/19 TAVR,re-intubated after moving LE and requiring fem-stop  Interim History / Subjective:  Extubated overnight.   Objective    Blood pressure (!) 120/51, pulse 69, temperature 99.5 F (37.5 C), temperature source Oral, resp. rate 12, height 5' (1.524 m), weight 81.6 kg, SpO2 93%.    Vent Mode: PSV FiO2 (%):  [21 %-40 %] 21 % Set Rate:  [4 bmp-20 bmp] 4 bmp Vt Set:  [360 mL] 360 mL PEEP:  [5 cmH20] 5 cmH20 Pressure Support:  [10 cmH20] 10 cmH20 Plateau Pressure:  [11 cmH20-12 cmH20] 11 cmH20   Intake/Output Summary (Last 24 hours) at 05/04/2024 0731 Last data filed at 05/04/2024 0000 Gross per 24 hour  Intake 599.44 ml  Output --  Net 599.44 ml    Filed Weights   05/03/24 1058  Weight: 81.6 kg    General:  chronically ill appearing woman lying in bed in NAD HEENT: [place]/AT, eyes anicteric Neuro: awake, alert, moving all extremities CV: S1S2, RRR, no murmur PULM:  breathing comfortably on RA, CTAB GI: obese, soft, NT Extremities: fistula LUE, no significant hematoma b/l groins-- dressings intact, clean & dry  K+ 4.7 BUN 34 Cr 5.75 WBC 8.1 H/H   Resolved problem list   Assessment and Plan    Severe Aortic Stenosis s/p TAVR RLS Hypocalcemia  Paroxysmal atrial fibrillation HLD Asthma history, not acutely exacerbated ESRD on MWF iHD -Was supposed to ger iHD today, but she does not want to stay, and she has missed her OP early morning session already. She wants to go home and just go to HD Friday. Giving lokelma  today and she needs another dose tomorrow to prevent severe hyperkalemia over the next few days.  -can restart Breo & Incruse -con't PTA apixaban ; doesn't need aspirin  -daily statin   Labs   CBC: Recent Labs  Lab 04/29/24 1200 05/03/24 1144 05/03/24 1226 05/03/24 1533 05/03/24 1957 05/03/24 2126 05/04/24 0230  WBC 5.4  --   --   --  10.3  --  8.1  HGB 11.2*   < > 11.9* 10.9* 11.5* 12.6 10.3*  HCT 36.4   < > 35.0* 32.0* 38.5 37.0 33.2*  MCV 90.1  --   --   --  93.4  --  92.0  PLT 153  --   --   --  163  --  159   < > =  values in this interval not displayed.    Basic Metabolic Panel: Recent Labs  Lab 04/29/24 1200 05/03/24 1144 05/03/24 1226 05/03/24 1533 05/03/24 1957 05/03/24 2126 05/04/24 0230  NA 137 135 135 136 136 134* 135  K 3.6 5.6* 4.4 4.2 4.4 4.7 4.7  CL 99 100 102 99 97*  --  96*  CO2 23  --   --   --  20*  --  21*  GLUCOSE 94 68* 69* 75 85  --  81  BUN 12 36* 25* 27* 30*  --  34*  CREATININE 2.80* 5.30* 5.00* 4.80* 5.25*  --  5.75*  CALCIUM  9.2  --   --   --  7.8*  --  7.8*  MG  --   --   --   --  2.0  --  2.1      Critical care time:        Leita SHAUNNA Gaskins, DO  05/04/24 10:10 AM [facility]  For contact information, see Amion. If no response to pager, please call PCCM consult pager. After hours, 7PM- 7AM, please call Elink.

## 2024-05-04 NOTE — Anesthesia Postprocedure Evaluation (Signed)
 Anesthesia Post Note  Patient: Whitney Chavez  Procedure(s) Performed: Transcatheter Aortic Valve Replacement, Transfemoral ECHOCARDIOGRAM, TRANSTHORACIC     Patient location during evaluation: SICU Anesthesia Type: General Level of consciousness: sedated Pain management: pain level controlled Vital Signs Assessment: post-procedure vital signs reviewed and stable Respiratory status: patient remains intubated per anesthesia plan Cardiovascular status: stable Postop Assessment: no apparent nausea or vomiting Anesthetic complications: no   There were no known notable events for this encounter.  Last Vitals:  Vitals:   05/04/24 0900 05/04/24 0951  BP: (!) 105/57 111/71  Pulse: 70 68  Resp: 17 12  Temp:    SpO2: 99% 96%    Last Pain:  Vitals:   05/04/24 0916  TempSrc:   PainSc: 4                  Epifanio Lamar BRAVO

## 2024-05-04 NOTE — Progress Notes (Signed)
  Echocardiogram 2D Echocardiogram has been performed.  Koleen KANDICE Popper, RDCS 05/04/2024, 8:27 AM

## 2024-05-05 ENCOUNTER — Telehealth: Payer: Self-pay

## 2024-05-05 NOTE — Telephone Encounter (Signed)
 Patient contacted regarding discharge from [facility] on 05/04/24  Patient understands to follow up with provider Izetta Hummer, PA-C on 05/11/24 at 1:00PM at [address] Location. Patient understands discharge instructions? Yes Patient understands medications and regiment? Yes Patient understands to bring all medications to this visit? Yes

## 2024-05-06 MED FILL — Fentanyl Citrate Preservative Free (PF) Inj 100 MCG/2ML: INTRAMUSCULAR | Qty: 4 | Status: AC

## 2024-05-09 NOTE — Progress Notes (Unsigned)
 [facility]   MULTIDISCIPLINARY HEART VALVE CLINIC                                     Cardiology Office Note:    Date:  05/11/2024   ID:  TERYL GUBLER, DOB May 02, 1956, MRN [id number]  PCP:  Cleotilde Planas, MD  [facility] Cardiologist:  Lurena MARLA Red, MD  [facility] Structural heart: Lurena MARLA Red, MD [facility] Electrophysiologist:  None   Referring MD: Cleotilde Planas, MD   [facility] s/p TAVR  History of Present Illness:    Whitney Chavez is a 68 y.o. female with a hx of ESRD (HD MWF), severe restless legs, Factor V Leyden heterozygous + Prothrombin II mutation on Eliquis , PAF, anemia of chronic disease, HLD, HTN, OSA on CPAP and severe aortic stenosis s/p TAVR 05/03/24 who presents to clinic for follow up.   She has been on dialysis for 14 years, with a well-functioning fistula. She developed renal failure 2/2 vitamin D toxicity and calcium  overload. She experiences episodes of atrial fibrillation, typically when excessive fluid is removed during dialysis, leading to her feeling 'too dry.' She is on Eliquis  5 mg twice daily for factor V Leiden and prothrombin gene mutation, and she undergoes bridge therapy with heparin  before surgeries. She has a history of calciphylaxis and underwent a punch biopsy, followed by further surgery for a non-healing wound. She has a history of known aortic stenosis that was followed at [facility]; however, the pt wanted to transfer her care to [facility] and was set up with Dr. Red. Echo on 01/12/24 at [facility] showed EF 60% and severe AS with a mean grad 40.2 mmHg & AVA 0.78 cm2. Coronary angiography showed minimal nonobstructive CAD. Right heart catheterization was deferred due to occluded venous system of the right upper extremity and right internal jugular systems. S/p successful TAVR with a 29 mm Medtronic Evlout FX + THV via the TF approach on 05/03/24. She was intubated and sedated for operation and post op recovery due to severe RLS. Post  operative echo showed EF 70%, mild LVH, mod MAC, normally functioning TAVR with a mean gradient of 15.5 mmHg and no PVL.  Today the patient presents to clinic for follow up. No CP or SOB. No LE edema, orthopnea or PND. No dizziness or syncope. No blood in stool or urine. No palpitations. She feels badly on HD days. Has had some hip pain.      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 Red and lightfoot   Severe aortic stenosis    Vision abnormalities      Current Medications: Current Meds  Medication Sig   acetaminophen  (TYLENOL ) 500 MG tablet Take 500 mg by mouth every 6 (six) hours as needed for moderate pain.   amiodarone  (PACERONE )  200 MG tablet Take 200 mg by mouth in the morning.   amoxicillin  (AMOXIL ) 500 MG tablet Take 4 tablets (2,000 mg total) by mouth as directed. 1 hour prior to dental work including cleanings   apixaban  (ELIQUIS ) 5 MG TABS tablet Take 1 tablet (5 mg total) by mouth 2 (two) times daily.   atorvastatin  (LIPITOR) 20 MG tablet Take 20 mg by mouth every evening.   BREYNA  160-4.5 MCG/ACT inhaler Inhale 2 puffs into the lungs 2 (two) times daily.   cetirizine (ZYRTEC) 10 MG tablet Take 10 mg by mouth daily in the afternoon.   Elastic Bandages & Supports (MEDICAL COMPRESSION STOCKINGS) MISC by Does not apply route. Leg wraps.   epoetin alfa (EPOGEN) 2000 UNIT/ML injection 2,000 Units 3 (three) times a week.   Etelcalcetide HCl (PARSABIV IV) Inject 1 Dose into the vein 3  (three) times a week. Receives at Dialysis   fluocinonide ointment (LIDEX) 0.05 % Apply 1 Application topically 2 (two) times daily as needed (psoriasis).   gabapentin  (NEURONTIN ) 100 MG capsule Take 1 capsule (100 mg total) by mouth 4 (four) times daily. (Patient taking differently: Take 100-200 mg by mouth 2 (two) times daily. In the afternoon and in the evening)   lanthanum  (FOSRENOL ) 500 MG chewable tablet Chew 500-1,000 mg by mouth See admin instructions. Take 1-2 tablets ([phone] mg) by mouth with each meal & take 1 tablet (500 mg) with snacks.   multivitamin (RENA-VIT) TABS tablet Take 1 tablet by mouth daily in the afternoon.   naphazoline-pheniramine (ALLERGY EYE) 0.025-0.3 % ophthalmic solution Place 1-2 drops into both eyes 4 (four) times daily as needed for eye irritation or allergies.   omeprazole (PRILOSEC) 20 MG capsule Take 20 mg by mouth daily as needed (indigestion/heartburn.).   ondansetron  (ZOFRAN -ODT) 4 MG disintegrating tablet Take 4 mg by mouth every Monday, Wednesday, and Friday.   oxyCODONE  (ROXICODONE ) 5 MG immediate release tablet Take one po qd formore severe pain   rOPINIRole  (REQUIP ) 0.5 MG tablet TAKE 1-2 TABLETS BY MOUTH IN THE MORNING. (Patient taking differently: Take 0.5 mg by mouth daily in the afternoon.)   rOPINIRole  (REQUIP ) 2 MG tablet Take 1 tablet (2 mg total) by mouth at bedtime.   SODIUM THIOSULFATE IV Inject 1 Dose into the vein 2 (two) times a week. Receives at Dialysis   sulfaSALAzine  (AZULFIDINE ) 500 MG tablet Take 1,000 mg by mouth 2 (two) times daily.   traMADol  (ULTRAM ) 50 MG tablet TAKE 1 TABLET (50 MG TOTAL) BY MOUTH 2 (TWO) TIMES DAILY AS NEEDED (PAIN).   triamcinolone cream (KENALOG) 0.1 % Apply 1 Application topically daily as needed (itching).   umeclidinium bromide  (INCRUSE ELLIPTA ) 62.5 MCG/ACT AEPB Inhale 1 puff into the lungs daily.      ROS:   Please see the history of present illness.    All other systems reviewed and are  negative.  EKGs   EKG Interpretation Date/Time:  Wednesday May 11 2024 12:55:36 EDT Ventricular Rate:  76 PR Interval:  216 QRS Duration:  170 QT Interval:  476 QTC Calculation: 535 R Axis:   29  Text Interpretation: Sinus rhythm with 1st degree A-V block Left bundle branch block When compared with ECG of 04-May-2024 08:32, Left bundle branch block is now Present Confirmed by Sebastian Collar [phone]) on 05/11/2024 1:35:31 PM   Risk Assessment/Calculations:           Physical Exam:    VS:  BP 108/68   Pulse 76   Ht 5' (  1.524 m)   Wt 177 lb (80.3 kg)   SpO2 95%   BMI 34.57 kg/m     Wt Readings from Last 3 Encounters:  05/11/24 177 lb (80.3 kg)  05/03/24 180 lb (81.6 kg)  04/22/24 180 lb (81.6 kg)     GEN: Well nourished, well developed in no acute distress NECK: No JVD CARDIAC: RRR, no murmurs, rubs, gallops RESPIRATORY:  Clear to auscultation without rales, wheezing or rhonchi  ABDOMEN: Soft, non-tender, non-distended EXTREMITIES:  No edema; No deformity.  Groin sites clear without hematoma. Ecchymosis noted over pubic bone.  ASSESSMENT:    1. S/P TAVR (transcatheter aortic valve replacement)   2. ESRD (end stage renal disease) (HCC)   3. Factor 5 Leiden mutation, heterozygous (HCC)   4. Hyperlipidemia LDL goal <70   5. PAF (paroxysmal atrial fibrillation) (HCC)     PLAN:    In order of problems listed above:  Severe AS s/p TAVR:  -- Pt doing well s/p TAVR.  -- ECG noted to have new LBBB but no HAVB and no dizziness/weakness.  -- Groin sites healing well.  -- SBE prophylaxis discussed; I have RX'd amoxicillin .   -- Continue Eliquis  5mg  BID.  -- Cleared to resume all activities without restriction. -- Wants to participate in CRP2 but only available M,W,F and doesn't want to move HD days to T,TH,S. -- I will see back for 1 month echo and OV.   ESRD on HD:  -- M,W,F HD.    Factor V Leyden mutation: -- Continue Eliquis  5 mg twice daily    Hyperlipidemia:  -- Continue atorvastatin  20 mg daily.   Anemia:  -- Hg stable at 10.3 -- Continue EPO 2000 units 3 times a week.   PAF: -- Maintaining sinus by ECG today.  -- Continue amiodarone  200mg  daily.  -- Continue Eliquis  5mg  BID.   Medication Adjustments/Labs and Tests Ordered: Current medicines are reviewed at length with the patient today.  Concerns regarding medicines are outlined above.  Orders Placed This Encounter  Procedures   EKG 12-Lead   ECHOCARDIOGRAM COMPLETE   Meds ordered this encounter  Medications   amoxicillin  (AMOXIL ) 500 MG tablet    Sig: Take 4 tablets (2,000 mg total) by mouth as directed. 1 hour prior to dental work including cleanings    Dispense:  12 tablet    Refill:  12    Supervising Provider:   WONDA SHARPER [[id number]]    Patient Instructions  Medication Instructions:  Start Amoxicillin  500 mg, take 4 tablets by mouth 1 hour prior to dental procedures and cleanings.   *If you need a refill on your cardiac medications before your next appointment, please call your pharmacy*  Lab Work: None needed If you have labs (blood work) drawn today and your tests are completely normal, you will receive your results only by: MyChart Message (if you have MyChart) OR A paper copy in the mail If you have any lab test that is abnormal or we need to change your treatment, we will call you to review the results.  Testing/Procedures: 06/16/2024 Your physician has requested that you have an echocardiogram. Echocardiography is a painless test that uses sound waves to create images of your heart. It provides your doctor with information about the size and shape of your heart and how well your heart's chambers and valves are working. This procedure takes approximately one hour. There are no restrictions for this procedure. Please do NOT wear cologne, perfume, aftershave, or lotions (deodorant  is allowed). Please arrive 15 minutes prior to your appointment  time.  Please note: We ask at that you not bring children with you during ultrasound (echo/ vascular) testing. Due to room size and safety concerns, children are not allowed in the ultrasound rooms during exams. Our front office staff cannot provide observation of children in our lobby area while testing is being conducted. An adult accompanying a patient to their appointment will only be allowed in the ultrasound room at the discretion of the ultrasound technician under special circumstances. We apologize for any inconvenience.   Follow-Up: At [facility], you and your health needs are our priority.  As part of our continuing mission to provide you with exceptional heart care, our providers are all part of one team.  This team includes your primary Cardiologist (physician) and Advanced Practice Providers or APPs (Physician Assistants and Nurse Practitioners) who all work together to provide you with the care you need, when you need it.  Your next appointment:   As scheduled: 06/16/24  Provider:   Izetta Hummer, PA-C    We recommend signing up for the patient portal called MyChart.  Sign up information is provided on this After Visit Summary.  MyChart is used to connect with patients for Virtual Visits (Telemedicine).  Patients are able to view lab/test results, encounter notes, upcoming appointments, etc.  Non-urgent messages can be sent to your provider as well.   To learn more about what you can do with MyChart, go to [email or web address].    Signed, Lamarr Hummer, PA-C  05/11/2024 1:36 PM    [facility]

## 2024-05-11 ENCOUNTER — Ambulatory Visit: Attending: Physician Assistant | Admitting: Physician Assistant

## 2024-05-11 VITALS — BP 108/68 | HR 76 | Ht 60.0 in | Wt 177.0 lb

## 2024-05-11 DIAGNOSIS — Z952 Presence of prosthetic heart valve: Secondary | ICD-10-CM

## 2024-05-11 DIAGNOSIS — D6851 Activated protein C resistance: Secondary | ICD-10-CM | POA: Diagnosis not present

## 2024-05-11 DIAGNOSIS — N186 End stage renal disease: Secondary | ICD-10-CM

## 2024-05-11 DIAGNOSIS — I480 Paroxysmal atrial fibrillation: Secondary | ICD-10-CM

## 2024-05-11 DIAGNOSIS — E785 Hyperlipidemia, unspecified: Secondary | ICD-10-CM | POA: Diagnosis not present

## 2024-05-11 MED ORDER — AMOXICILLIN 500 MG PO TABS: 2000.0000 mg | ORAL_TABLET | ORAL | 12 refills | Status: DC

## 2024-05-11 NOTE — Patient Instructions (Signed)
 Medication Instructions:  Start Amoxicillin  500 mg, take 4 tablets by mouth 1 hour prior to dental procedures and cleanings.   *If you need a refill on your cardiac medications before your next appointment, please call your pharmacy*  Lab Work: None needed If you have labs (blood work) drawn today and your tests are completely normal, you will receive your results only by: MyChart Message (if you have MyChart) OR A paper copy in the mail If you have any lab test that is abnormal or we need to change your treatment, we will call you to review the results.  Testing/Procedures: 06/16/2024 Your physician has requested that you have an echocardiogram. Echocardiography is a painless test that uses sound waves to create images of your heart. It provides your doctor with information about the size and shape of your heart and how well your heart's chambers and valves are working. This procedure takes approximately one hour. There are no restrictions for this procedure. Please do NOT wear cologne, perfume, aftershave, or lotions (deodorant is allowed). Please arrive 15 minutes prior to your appointment time.  Please note: We ask at that you not bring children with you during ultrasound (echo/ vascular) testing. Due to room size and safety concerns, children are not allowed in the ultrasound rooms during exams. Our front office staff cannot provide observation of children in our lobby area while testing is being conducted. An adult accompanying a patient to their appointment will only be allowed in the ultrasound room at the discretion of the ultrasound technician under special circumstances. We apologize for any inconvenience.   Follow-Up: At [facility], you and your health needs are our priority.  As part of our continuing mission to provide you with exceptional heart care, our providers are all part of one team.  This team includes your primary Cardiologist (physician) and Advanced Practice  Providers or APPs (Physician Assistants and Nurse Practitioners) who all work together to provide you with the care you need, when you need it.  Your next appointment:   As scheduled: 06/16/24  Provider:   Izetta Hummer, PA-C    We recommend signing up for the patient portal called MyChart.  Sign up information is provided on this After Visit Summary.  MyChart is used to connect with patients for Virtual Visits (Telemedicine).  Patients are able to view lab/test results, encounter notes, upcoming appointments, etc.  Non-urgent messages can be sent to your provider as well.   To learn more about what you can do with MyChart, go to [email or web address].

## 2024-05-20 ENCOUNTER — Encounter: Payer: Self-pay | Admitting: Neurology

## 2024-05-22 ENCOUNTER — Encounter: Payer: Self-pay | Admitting: Neurology

## 2024-05-23 ENCOUNTER — Other Ambulatory Visit: Payer: Self-pay | Admitting: Neurology

## 2024-05-23 MED ORDER — OXYCODONE HCL 5 MG PO TABS: ORAL_TABLET | ORAL | 0 refills | Status: DC

## 2024-05-25 ENCOUNTER — Inpatient Hospital Stay (HOSPITAL_COMMUNITY)

## 2024-05-25 ENCOUNTER — Encounter (HOSPITAL_COMMUNITY): Payer: Self-pay | Admitting: *Deleted

## 2024-05-25 ENCOUNTER — Observation Stay (HOSPITAL_COMMUNITY)
Admission: EM | Admit: 2024-05-25 | Discharge: 2024-05-26 | Disposition: A | Discharge status: Home / Self Care | Attending: Family Medicine | Admitting: Family Medicine

## 2024-05-25 ENCOUNTER — Other Ambulatory Visit: Payer: Self-pay

## 2024-05-25 DIAGNOSIS — D6851 Activated protein C resistance: Secondary | ICD-10-CM | POA: Diagnosis not present

## 2024-05-25 DIAGNOSIS — N186 End stage renal disease: Secondary | ICD-10-CM | POA: Insufficient documentation

## 2024-05-25 DIAGNOSIS — G4733 Obstructive sleep apnea (adult) (pediatric): Secondary | ICD-10-CM | POA: Diagnosis not present

## 2024-05-25 DIAGNOSIS — Z79899 Other long term (current) drug therapy: Secondary | ICD-10-CM | POA: Insufficient documentation

## 2024-05-25 DIAGNOSIS — T82598A Other mechanical complication of other cardiac and vascular devices and implants, initial encounter: Secondary | ICD-10-CM | POA: Diagnosis present

## 2024-05-25 DIAGNOSIS — D696 Thrombocytopenia, unspecified: Secondary | ICD-10-CM | POA: Insufficient documentation

## 2024-05-25 DIAGNOSIS — I4891 Unspecified atrial fibrillation: Secondary | ICD-10-CM | POA: Diagnosis present

## 2024-05-25 DIAGNOSIS — I120 Hypertensive chronic kidney disease with stage 5 chronic kidney disease or end stage renal disease: Secondary | ICD-10-CM | POA: Diagnosis not present

## 2024-05-25 DIAGNOSIS — Z992 Dependence on renal dialysis: Secondary | ICD-10-CM | POA: Diagnosis not present

## 2024-05-25 DIAGNOSIS — I4892 Unspecified atrial flutter: Secondary | ICD-10-CM | POA: Diagnosis not present

## 2024-05-25 DIAGNOSIS — G2581 Restless legs syndrome: Secondary | ICD-10-CM | POA: Insufficient documentation

## 2024-05-25 DIAGNOSIS — D649 Anemia, unspecified: Secondary | ICD-10-CM | POA: Diagnosis not present

## 2024-05-25 DIAGNOSIS — J449 Chronic obstructive pulmonary disease, unspecified: Secondary | ICD-10-CM | POA: Insufficient documentation

## 2024-05-25 DIAGNOSIS — I483 Typical atrial flutter: Principal | ICD-10-CM

## 2024-05-25 LAB — CBC WITH DIFFERENTIAL/PLATELET
Abs Immature Granulocytes: 0.02 K/uL (ref 0.00–0.07)
Basophils Absolute: 0.1 K/uL (ref 0.0–0.1)
Basophils Relative: 1 %
Eosinophils Absolute: 0.2 K/uL (ref 0.0–0.5)
Eosinophils Relative: 5 %
HCT: 31.7 % — ABNORMAL LOW (ref 36.0–46.0)
Hemoglobin: 9.7 g/dL — ABNORMAL LOW (ref 12.0–15.0)
Immature Granulocytes: 0 %
Lymphocytes Relative: 24 %
Lymphs Abs: 1.3 K/uL (ref 0.7–4.0)
MCH: 28.6 pg (ref 26.0–34.0)
MCHC: 30.6 g/dL (ref 30.0–36.0)
MCV: 93.5 fL (ref 80.0–100.0)
Monocytes Absolute: 0.5 K/uL (ref 0.1–1.0)
Monocytes Relative: 9 %
Neutro Abs: 3.3 K/uL (ref 1.7–7.7)
Neutrophils Relative %: 61 %
Platelets: 81 K/uL — ABNORMAL LOW (ref 150–400)
RBC: 3.39 MIL/uL — ABNORMAL LOW (ref 3.87–5.11)
RDW: 17 % — ABNORMAL HIGH (ref 11.5–15.5)
WBC: 5.3 K/uL (ref 4.0–10.5)
nRBC: 0 % (ref 0.0–0.2)

## 2024-05-25 LAB — DIFFERENTIAL
Abs Immature Granulocytes: 0.04 K/uL (ref 0.00–0.07)
Basophils Absolute: 0.1 K/uL (ref 0.0–0.1)
Basophils Relative: 1 %
Eosinophils Absolute: 0.3 K/uL (ref 0.0–0.5)
Eosinophils Relative: 5 %
Immature Granulocytes: 1 %
Lymphocytes Relative: 21 %
Lymphs Abs: 1.2 K/uL (ref 0.7–4.0)
Monocytes Absolute: 0.6 K/uL (ref 0.1–1.0)
Monocytes Relative: 10 %
Neutro Abs: 3.6 K/uL (ref 1.7–7.7)
Neutrophils Relative %: 62 %

## 2024-05-25 LAB — COMPREHENSIVE METABOLIC PANEL WITH GFR
ALT: 7 U/L (ref 0–44)
AST: 22 U/L (ref 15–41)
Albumin: 3.4 g/dL — ABNORMAL LOW (ref 3.5–5.0)
Alkaline Phosphatase: 63 U/L (ref 38–126)
Anion gap: 14 (ref 5–15)
BUN: 18 mg/dL (ref 8–23)
CO2: 23 mmol/L (ref 22–32)
Calcium: 9.3 mg/dL (ref 8.9–10.3)
Chloride: 98 mmol/L (ref 98–111)
Creatinine, Ser: 3.02 mg/dL — ABNORMAL HIGH (ref 0.44–1.00)
GFR, Estimated: 16 mL/min — ABNORMAL LOW (ref >60)
Glucose, Bld: 84 mg/dL (ref 70–99)
Potassium: 3.7 mmol/L (ref 3.5–5.1)
Sodium: 135 mmol/L (ref 135–145)
Total Bilirubin: 1 mg/dL (ref 0.0–1.2)
Total Protein: 6.7 g/dL (ref 6.5–8.1)

## 2024-05-25 LAB — HIV ANTIBODY (ROUTINE TESTING W REFLEX): HIV Screen 4th Generation wRfx: NONREACTIVE

## 2024-05-25 LAB — FOLATE: Folate: >20 ng/mL (ref >5.9)

## 2024-05-25 LAB — LACTATE DEHYDROGENASE: LDH: 210 U/L — ABNORMAL HIGH (ref 98–192)

## 2024-05-25 LAB — PROTIME-INR
INR: 1.3 — ABNORMAL HIGH (ref 0.8–1.2)
Prothrombin Time: 16.8 s — ABNORMAL HIGH (ref 11.4–15.2)

## 2024-05-25 LAB — CREATININE, SERUM
Creatinine, Ser: 3.31 mg/dL — ABNORMAL HIGH (ref 0.44–1.00)
GFR, Estimated: 15 mL/min — ABNORMAL LOW (ref >60)

## 2024-05-25 LAB — CBC
HCT: 31.6 % — ABNORMAL LOW (ref 36.0–46.0)
Hemoglobin: 10.1 g/dL — ABNORMAL LOW (ref 12.0–15.0)
MCH: 29.9 pg (ref 26.0–34.0)
MCHC: 32 g/dL (ref 30.0–36.0)
MCV: 93.5 fL (ref 80.0–100.0)
Platelets: 88 K/uL — ABNORMAL LOW (ref 150–400)
RBC: 3.38 MIL/uL — ABNORMAL LOW (ref 3.87–5.11)
RDW: 17.2 % — ABNORMAL HIGH (ref 11.5–15.5)
WBC: 6 K/uL (ref 4.0–10.5)
nRBC: 0 % (ref 0.0–0.2)

## 2024-05-25 LAB — TECHNOLOGIST SMEAR REVIEW: Plt Morphology: NORMAL

## 2024-05-25 LAB — VITAMIN B12: Vitamin B-12: 633 pg/mL (ref 180–914)

## 2024-05-25 LAB — TSH: TSH: 7.826 u[IU]/mL — ABNORMAL HIGH (ref 0.350–4.500)

## 2024-05-25 LAB — APTT: aPTT: 36 s (ref 24–36)

## 2024-05-25 LAB — MAGNESIUM: Magnesium: 1.9 mg/dL (ref 1.7–2.4)

## 2024-05-25 MED ORDER — AMIODARONE HCL IN DEXTROSE 360-4.14 MG/200ML-% IV SOLN
30.0000 mg/h | INTRAVENOUS | Status: DC
  Administered 2024-05-25 – 2024-05-26 (×2): 30 mg/h via INTRAVENOUS
  Filled 2024-05-25 (×2): qty 200

## 2024-05-25 MED ORDER — ATORVASTATIN CALCIUM 10 MG PO TABS: 20.0000 mg | ORAL_TABLET | Freq: Every evening | ORAL | Status: DC

## 2024-05-25 MED ORDER — LORATADINE 10 MG PO TABS
10.0000 mg | ORAL_TABLET | Freq: Every day | ORAL | Status: DC
Start: 2024-05-25 — End: 2024-05-26
  Administered 2024-05-25: 10 mg via ORAL
  Filled 2024-05-25 (×2): qty 1

## 2024-05-25 MED ORDER — ROPINIROLE HCL 1 MG PO TABS
0.5000 mg | ORAL_TABLET | Freq: Every day | ORAL | Status: DC
  Administered 2024-05-25: 0.5 mg via ORAL
  Filled 2024-05-25: qty 1

## 2024-05-25 MED ORDER — LANTHANUM CARBONATE 500 MG PO CHEW: 500.0000 mg | CHEWABLE_TABLET | ORAL | Status: DC

## 2024-05-25 MED ORDER — LANTHANUM CARBONATE 500 MG PO CHEW
1000.0000 mg | CHEWABLE_TABLET | Freq: Three times a day (TID) | ORAL | Status: DC
Start: 2024-05-25 — End: 2024-05-26
  Filled 2024-05-25 (×4): qty 2

## 2024-05-25 MED ORDER — OXYCODONE HCL 5 MG PO TABS
5.0000 mg | ORAL_TABLET | ORAL | Status: DC | PRN
  Administered 2024-05-25 – 2024-05-26 (×2): 5 mg via ORAL
  Filled 2024-05-25 (×2): qty 1

## 2024-05-25 MED ORDER — FLUTICASONE FUROATE-VILANTEROL 200-25 MCG/ACT IN AEPB: 1.0000 | INHALATION_SPRAY | Freq: Every day | RESPIRATORY_TRACT | Status: DC

## 2024-05-25 MED ORDER — AMIODARONE HCL 200 MG PO TABS
200.0000 mg | ORAL_TABLET | Freq: Every morning | ORAL | Status: DC
Start: 2024-05-26 — End: 2024-05-25

## 2024-05-25 MED ORDER — LANTHANUM CARBONATE 500 MG PO CHEW
500.0000 mg | CHEWABLE_TABLET | ORAL | Status: DC
  Filled 2024-05-25 (×4): qty 1

## 2024-05-25 MED ORDER — RENA-VITE PO TABS
1.0000 | ORAL_TABLET | Freq: Every day | ORAL | Status: DC
  Administered 2024-05-25: 1 via ORAL
  Filled 2024-05-25 (×2): qty 1

## 2024-05-25 MED ORDER — PANTOPRAZOLE SODIUM 40 MG PO TBEC
40.0000 mg | DELAYED_RELEASE_TABLET | Freq: Every day | ORAL | Status: DC
  Administered 2024-05-25: 40 mg via ORAL
  Filled 2024-05-25 (×2): qty 1

## 2024-05-25 MED ORDER — APIXABAN 5 MG PO TABS
5.0000 mg | ORAL_TABLET | Freq: Two times a day (BID) | ORAL | Status: DC
  Administered 2024-05-25 – 2024-05-26 (×2): 5 mg via ORAL
  Filled 2024-05-25 (×3): qty 1

## 2024-05-25 MED ORDER — AMIODARONE HCL IN DEXTROSE 360-4.14 MG/200ML-% IV SOLN
60.0000 mg/h | INTRAVENOUS | Status: AC
  Administered 2024-05-25: 60 mg/h via INTRAVENOUS
  Filled 2024-05-25: qty 200

## 2024-05-25 MED ORDER — SODIUM CHLORIDE 0.9 % IV BOLUS
500.0000 mL | Freq: Once | INTRAVENOUS | Status: AC
  Administered 2024-05-25: 500 mL via INTRAVENOUS

## 2024-05-25 MED ORDER — IPRATROPIUM-ALBUTEROL 0.5-2.5 (3) MG/3ML IN SOLN: 3.0000 mL | Freq: Four times a day (QID) | RESPIRATORY_TRACT | Status: DC | PRN

## 2024-05-25 MED ORDER — HEPARIN SODIUM (PORCINE) 5000 UNIT/ML IJ SOLN: 5000.0000 [IU] | Freq: Three times a day (TID) | INTRAMUSCULAR | Status: DC

## 2024-05-25 MED ORDER — OXYCODONE HCL 5 MG PO TABS
5.0000 mg | ORAL_TABLET | Freq: Once | ORAL | Status: AC
  Administered 2024-05-25: 5 mg via ORAL
  Filled 2024-05-25: qty 1

## 2024-05-25 NOTE — ED Notes (Addendum)
 PT was screaming out loud. This RN went to assist the PT and the PT told this RN that she was having issues with her restless legs.This RN informed the PT that her RN had notified the admitting physician since there were no meds available. PT then stated that these medications should have been put in hours ago and that's why she wants to go home and will start ripping the cords and IV out. This RN told the PT that while I advise against this, she is in her right mind and is allowed to do so.

## 2024-05-25 NOTE — ED Notes (Signed)
 MD Okey at [facility]

## 2024-05-25 NOTE — ED Notes (Signed)
 Called [facility] and placed pt on monitor.

## 2024-05-25 NOTE — ED Notes (Signed)
 Pt complaining about signing out AMA because her monitor keeps going off with V-fib. Explained to her we cannot stop the monitor from going off due to an abnormal heart rate. However she is increasing in agitation and would like to speak to provider or sign out AMA. MD Georgina was messaged about pt request.

## 2024-05-25 NOTE — ED Notes (Signed)
 Admitting MD at [facility].

## 2024-05-25 NOTE — Consult Note (Addendum)
 Cardiology Consultation   Patient ID: ROMESHA SCHERER MRN: [id number]; DOB: 09-Nov-1955  Admit date: 05/25/2024 Date of Consult: 05/25/2024  PCP:  Cleotilde Planas, MD   [facility] Providers Cardiologist: Previously Dr. Debora at [facility]  Structural Heart:  Lurena MARLA Red, MD      Patient Profile: MAZELL AYLESWORTH is a 68 y.o. 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 Simon.  History of Present Illness:  Ms. Mergenthaler is has a history renal failure due to h/o vitamin D toxicity/calcium  overload. She has been on HD for 14 years. She relocated to [facility] from [place], [place]  due to needing better dialysis healthcare coverage. She had lost her paid-off home and had had to move into government housing before making the difficult decision to leave her home and move to [place]. She has a history of PAF. Notes indicate episodes previously typically occurred in the past when excessive fluid was removed during dialysis, leading to her feeling 'too dry.' She had been maintained on amiodarone  by her cardiology team at [facility]. In 2023 her cardiologist there attempted to discontinue given long term side effects (noting interstitial changes on CXR) and trialed her on Toprol  but she developed hypotension with HD so was placed back on amiodarone  in 2024. She is on Eliquis  5 mg twice daily; also has factor V Leiden and prothrombin gene mutation, and undergoes bridge therapy with heparin  before surgeries. She has a history of calciphylaxis by punch biopsy. More recently the [facility] structural heart team has been following her for severe aortic stenosis. Pre-TAVR  cath 03/2024 showed minimal CAD. Right heart catheterization was deferred due to occluded venous system of the right upper extremity and right internal jugular systems. She underwent successful TAVR on 05/03/24. She was intubated and sedated for operation and post op recovery due to severe RLS. Post operative echo showed EF 70%, mild LVH, mod MAC, normally functioning TAVR with a mean gradient of 15.5 mmHg and no PVL. She developed a new LBBB post-operatively but no higher grade AVB. She was felt to be doing well at 05/12/23 follow-up from cardiac standpoint. She has recently been communicating with her PCP due to severe worsening of pain and restlessness in her arms and legs during dialysis requiring oxycodone .  She was sent to the ED today due to concern of bruising at her LUE vascular site down her left arm. She reports she has had two infiltration events at HD due to her restless limbs and another when she was startled awake and jumped. Today she was also noted to be tachycardic with ED EKG consistent with atrial flutter with RVR 120s. SBP 88-120s. She has had no cardiac awareness of this thankfully. No CP, SOB, palpitations, edema, syncope. Labs show Hgb 9.7 (slight downtrend from 10-11 range, plt 81k previously 159), K 3.7, Cr 3.02, Mg 1.9. She received 500cc bolus and oxycodone  5mg . Reports adherence to all home meds - did take her home amiodarone  this AM but usually takes Eliquis  after she eats after dialysis, but wound up in ER today instead before she could take this. Otherwise denies missed doses since discharge of TAVR.   She is  really disheartened that she keeps having medical issues that are keeping her from achieving her goal of making it back to [place]  in October to finally see her family. She was supposed to have cataract surgery this week but it was derailed by this.   Past Medical History:  Diagnosis Date   Asthma    Axillary vein thrombosis (HCC) 08/16/2015   Calciphylaxis    Chronic  kidney disease    Colitis    COPD (chronic obstructive pulmonary disease) (HCC)    Factor V Leiden (HCC)    Hypertension    Lymphedema    legs   MRSA (methicillin resistant staph aureus) culture positive    Neuropathy    Obstructive sleep apnea 03/29/2015   OSA on CPAP    S/P TAVR (transcatheter aortic valve replacement) 05/03/2024   s/p TAVR with a 29 mm Medtronic Evlout FX + THV via the TF approach by Dr. Wendel and lightfoot   Severe aortic stenosis    Vision abnormalities     Past Surgical History:  Procedure Laterality Date   APPLICATION OF WOUND VAC     AV FISTULA PLACEMENT Left    BIOPSY  11/05/2022   Procedure: BIOPSY;  Surgeon: Burnette Fallow, MD;  Location: [facility];  Service: Gastroenterology;;   COLONOSCOPY WITH PROPOFOL  Bilateral 11/05/2022   Procedure: COLONOSCOPY WITH PROPOFOL ;  Surgeon: Burnette Fallow, MD;  Location: [facility];  Service: Gastroenterology;  Laterality: Bilateral;   CORONARY ANGIOGRAPHY N/A 03/29/2024   Procedure: CORONARY ANGIOGRAPHY;  Surgeon: Wendel Lurena POUR, MD;  Location: [facility];  Service: Cardiovascular;  Laterality: N/A;   ESOPHAGOGASTRODUODENOSCOPY (EGD) WITH PROPOFOL  Bilateral 03/19/2022   Procedure: ESOPHAGOGASTRODUODENOSCOPY (EGD) WITH PROPOFOL ;  Surgeon: Burnette Fallow, MD;  Location: [facility];  Service: Gastroenterology;  Laterality: Bilateral;   INTRAOPERATIVE TRANSTHORACIC ECHOCARDIOGRAM N/A 05/03/2024   Procedure: ECHOCARDIOGRAM, TRANSTHORACIC;  Surgeon: Wendel Lurena POUR, MD;  Location: [facility];  Service: Cardiovascular;  Laterality: N/A;   LOWER EXTREMITY ANGIOGRAPHY  03/29/2024   Procedure: Lower Extremity Angiography;  Surgeon: Wendel Lurena POUR, MD;  Location: [facility] INVASIVE CV LAB;  Service: Cardiovascular;;   lumbar decompression fusion     TONSILLECTOMY     UPPER EXTREMITY VENOGRAPHY  03/29/2024   Procedure: UPPER EXTREMITY VENOGRAPHY;  Surgeon: Wendel Lurena POUR, MD;  Location: [facility];   Service: Cardiovascular;;     Home Medications:  Prior to Admission medications   Medication Sig Start Date End Date Taking? Authorizing Provider  acetaminophen  (TYLENOL ) 500 MG tablet Take 500 mg by mouth every 6 (six) hours as needed for moderate pain.    [provider]  amiodarone  (PACERONE ) 200 MG tablet Take 200 mg by mouth in the morning. 12/28/18   [provider]  amoxicillin  (AMOXIL ) 500 MG capsule Take 4 capsules by mouth as directed. 1 hour prior to dental work including cleanings 05/11/24   [provider]  apixaban  (ELIQUIS ) 5 MG TABS tablet Take 1 tablet (5 mg total) by mouth 2 (two) times daily. 10/13/22 04/27/27  Timmy Maude SAUNDERS, MD  atorvastatin  (LIPITOR) 20 MG tablet Take 20 mg by mouth every evening. 01/03/19   [provider]  BREYNA  160-4.5 MCG/ACT inhaler Inhale 2 puffs into the lungs 2 (two) times daily. 09/04/23   Kassie Acquanetta Bradley, MD  cetirizine (ZYRTEC) 10 MG tablet Take 10 mg by mouth daily in the afternoon.    [provider]  Elastic Bandages & Supports (MEDICAL COMPRESSION STOCKINGS) MISC by Does not  apply route. Leg wraps.    [provider]  epoetin alfa (EPOGEN) 2000 UNIT/ML injection 2,000 Units 3 (three) times a week.    [provider]  Etelcalcetide HCl (PARSABIV IV) Inject 1 Dose into the vein 3 (three) times a week. Receives at Dialysis    [provider]  fluocinonide ointment (LIDEX) 0.05 % Apply 1 Application topically 2 (two) times daily as needed (psoriasis). 03/10/24   [provider]  gabapentin  (NEURONTIN ) 100 MG capsule Take 1 capsule (100 mg total) by mouth 4 (four) times daily. Patient taking differently: Take 100-200 mg by mouth 2 (two) times daily. In the afternoon and in the evening 08/04/23   Lomax, Amy, NP  lanthanum  (FOSRENOL ) 500 MG chewable tablet Chew 500-1,000 mg by mouth See admin instructions. Take 1-2 tablets ([phone] mg) by mouth with each meal & take 1  tablet (500 mg) with snacks.    [provider]  multivitamin (RENA-VIT) TABS tablet Take 1 tablet by mouth daily in the afternoon.    [provider]  naphazoline-pheniramine (ALLERGY EYE) 0.025-0.3 % ophthalmic solution Place 1-2 drops into both eyes 4 (four) times daily as needed for eye irritation or allergies.    [provider]  neomycin-bacitracin-polymyxin (NEOSPORIN) OINT Apply 1 Application topically as needed for wound care.    [provider]  omeprazole (PRILOSEC) 20 MG capsule Take 20 mg by mouth daily as needed (indigestion/heartburn.).    [provider]  ondansetron  (ZOFRAN -ODT) 4 MG disintegrating tablet Take 4 mg by mouth every Monday, Wednesday, and Friday.    [provider]  oxyCODONE  (ROXICODONE ) 5 MG immediate release tablet Take one po qd for more severe pain 05/23/24   Sater, Charlie LABOR, MD  PRESCRIPTION MEDICATION Inhale into the lungs at bedtime. CPAP with nasal pillow    [provider]  rOPINIRole  (REQUIP ) 0.5 MG tablet TAKE 1-2 TABLETS BY MOUTH IN THE MORNING. Patient taking differently: Take 0.5 mg by mouth daily in the afternoon. 08/04/23   Lomax, Amy, NP  rOPINIRole  (REQUIP ) 2 MG tablet Take 1 tablet (2 mg total) by mouth at bedtime. 08/04/23   Lomax, Amy, NP  SODIUM THIOSULFATE IV Inject 1 Dose into the vein 2 (two) times a week. Receives at Dialysis    [provider]  sulfaSALAzine  (AZULFIDINE ) 500 MG tablet Take 1,000 mg by mouth 2 (two) times daily. 02/15/15   [provider]  traMADol  (ULTRAM ) 50 MG tablet TAKE 1 TABLET (50 MG TOTAL) BY MOUTH 2 (TWO) TIMES DAILY AS NEEDED (PAIN). 12/17/23   Sater, Charlie LABOR, MD  triamcinolone cream (KENALOG) 0.1 % Apply 1 Application topically daily as needed (itching). 08/31/18   [provider]  umeclidinium bromide  (INCRUSE ELLIPTA ) 62.5 MCG/ACT AEPB Inhale 1 puff into the lungs daily. 09/03/23   Kassie Acquanetta Bradley, MD    Scheduled Meds:   NOREEN ON 05/26/2024] amiodarone   200 mg Oral q AM   apixaban   5 mg Oral BID   atorvastatin   20 mg Oral QPM   fluticasone  furoate-vilanterol  1 puff Inhalation Daily   lanthanum   500-1,000 mg Oral See admin instructions   loratadine   10 mg Oral Daily   multivitamin  1 tablet Oral Q1500   pantoprazole   40 mg Oral Daily   rOPINIRole   0.5 mg Oral Q1500   Continuous Infusions:  PRN Meds: ipratropium-albuterol , oxyCODONE   Allergies:    Allergies  Allergen Reactions   Estrogens Other (See Comments)    Blood clots  Other Other (See Comments)    Unable to take birth control pills due to clotting disorder/fim   Prednisone Other (See Comments)    Facial edema - has had this 2020 and had no difficulty can take in small doses   Progesterone Other (See Comments)    Blood clots   Propofol  Other (See Comments)    Became disinhibited and exacerbated restless leg syndrome during EGD/colonoscopy. Patient has requested an alternate form of sedation during future procedures.  Legs started thrashing and skin tears.   Lisinopril Cough   Clotrimazole Rash   Tape Rash and Other (See Comments)    Please use paper tape    Social History:   Social History   Socioeconomic History   Marital status: Single    Spouse name: Not on file   Number of children: Not on file   Years of education: Not on file   Highest education level: Not on file  Occupational History   Not on file  Tobacco Use   Smoking status: Former    Current packs/day: 0.00    Average packs/day: 2.0 packs/day for 30.0 years (60.0 ttl pk-yrs)    Types: Cigarettes    Start date: 11/24/1968    Quit date: 11/25/1998    Years since quitting: 25.5   Smokeless tobacco: Never  Vaping Use   Vaping status: Never Used  Substance and Sexual Activity   Alcohol use: No    Alcohol/week: 0.0 standard drinks of alcohol   Drug use: No   Sexual activity: Not on file  Other Topics Concern   Not on file  Social History Narrative   Not  on file   Social Drivers of Health   Financial Resource Strain: Not on file  Food Insecurity: Patient Declined (05/04/2024)   Hunger Vital Sign    Worried About Running Out of Food in the Last Year: Patient declined    Ran Out of Food in the Last Year: Patient declined  Transportation Needs: Patient Declined (05/04/2024)   PRAPARE - Administrator, Civil Service (Medical): Patient declined    Lack of Transportation (Non-Medical): Patient declined  Physical Activity: Not on file  Stress: Not on file  Social Connections: Patient Declined (05/04/2024)   Social Connection and Isolation Panel    Frequency of Communication with Friends and Family: Patient declined    Frequency of Social Gatherings with Friends and Family: Patient declined    Attends Religious Services: Patient declined    Database administrator or Organizations: Patient declined    Attends Banker Meetings: Patient declined    Marital Status: Patient declined  Intimate Partner Violence: Patient Declined (05/04/2024)   Humiliation, Afraid, Rape, and Kick questionnaire    Fear of Current or Ex-Partner: Patient declined    Emotionally Abused: Patient declined    Physically Abused: Patient declined    Sexually Abused: Patient declined    Family History:    Family History  Problem Relation Age of Onset   Diabetes Mother    Congestive Heart Failure Mother    COPD Mother    Stroke Mother    Neuropathy Mother    Heart disease Father    Stroke Father    Cancer Brother    Diabetes Brother    Diabetes Brother    Alcohol abuse Brother      ROS:  Please see the history of present illness.   All other ROS reviewed and negative.     Physical Exam/Data:  Vitals:   05/25/24 1430 05/25/24 1445 05/25/24 1500 05/25/24 1515  BP: 110/85 126/81 111/82 116/81  Pulse: (!) 120 (!) 112 (!) 117 (!) 121  Resp: 20 (!) 29 (!) 21 15  SpO2: 100% 100% 100% 99%  Weight:        Intake/Output Summary (Last 24  hours) at 05/25/2024 1536 Last data filed at 05/25/2024 1257 Gross per 24 hour  Intake 500 ml  Output --  Net 500 ml      05/25/2024   11:39 AM 05/11/2024   12:50 PM 05/03/2024   10:58 AM  Last 3 Weights  Weight (lbs) 177 lb 177 lb 180 lb  Weight (kg) 80.287 kg 80.287 kg 81.647 kg     Body mass index is 34.57 kg/m.  General: Well developed, well nourished, in no acute distress. Head: Normocephalic, atraumatic, sclera non-icteric, no xanthomas, nares are without discharge. Neck: Negative for carotid bruits. JVP not elevated. Lungs: Clear bilaterally to auscultation without wheezes, rales, or rhonchi. Breathing is unlabored. Heart: Reg rhythm, tachycardic, + SEM no rubs or gallops.  Abdomen: Soft, non-tender, non-distended with normoactive bowel sounds. No rebound/guarding. Extremities: No clubbing or cyanosis. No LE edema. Marked LUE bruising from fistula site down to wrist. Neuro: Alert and oriented X 3. Moves all extremities spontaneously. Psych:  Responds to questions appropriately with a normal affect.   EKG:  The EKG was personally reviewed and demonstrates:  suspected atrial flutter 122bpm, QRS Complex more narrow at 96ms, nonspecific STTW changes obscuring true QTc Telemetry:  Telemetry was personally reviewed and demonstrates:  Atrial flutter HR 120s  Relevant CV Studies: 2d echo 05/04/24   1. Left ventricular ejection fraction, by estimation, is 70 to 75%. The  left ventricle has hyperdynamic function. The left ventricle has no  regional wall motion abnormalities. There is mild concentric left  ventricular hypertrophy. Left ventricular  diastolic parameters are consistent with Grade I diastolic dysfunction  (impaired relaxation). The average left ventricular global longitudinal  strain is -18.8 %. The global longitudinal strain is normal.   2. Right ventricular systolic function is normal. The right ventricular  size is normal. There is normal pulmonary artery systolic  pressure.   3. Left atrial size was mildly dilated.   4. The mitral valve is abnormal. No evidence of mitral valve  regurgitation. No evidence of mitral stenosis. Moderate mitral annular  calcification.   5. The aortic valve has been repaired/replaced. Aortic valve  regurgitation is not visualized. No aortic stenosis is present. Echo  findings are consistent with normal structure and function of the aortic  valve prosthesis. Aortic valve mean gradient  measures 15.5 mmHg. Aortic valve Vmax measures 2.73 m/s.   6. The inferior vena cava is normal in size with greater than 50%  respiratory variability, suggesting right atrial pressure of 3 mmHg.    Laboratory Data: High Sensitivity Troponin:  No results for input(s): TROPONINIHS in the last 720 hours.   Chemistry Recent Labs  Lab 05/25/24 1205  NA 135  K 3.7  CL 98  CO2 23  GLUCOSE 84  BUN 18  CREATININE 3.02*  CALCIUM  9.3  MG 1.9  GFRNONAA 16*  ANIONGAP 14    Recent Labs  Lab 05/25/24 1205  PROT 6.7  ALBUMIN 3.4*  AST 22  ALT 7  ALKPHOS 63  BILITOT 1.0   Lipids No results for input(s): CHOL, TRIG, HDL, LABVLDL, LDLCALC, CHOLHDL in the last 168 hours.  Hematology Recent Labs  Lab 05/25/24 1205  WBC  5.3  RBC 3.39*  HGB 9.7*  HCT 31.7*  MCV 93.5  MCH 28.6  MCHC 30.6  RDW 17.0*  PLT 81*   Thyroid No results for input(s): TSH, FREET4 in the last 168 hours.  BNPNo results for input(s): BNP, PROBNP in the last 168 hours.  DDimer No results for input(s): DDIMER in the last 168 hours.  Radiology/Studies:  No results found.   Assessment and Plan:  1. Atrial flutter with RVR, with history of atrial fibrillation (also LBBB at 04/2024 follow-up) - management made challenging due to h/o hypotension with HD when trialed on Toprol  in the past, antiarrhythmic options otherwise limited by ESRD - maintained on amiodarone  200mg  daily at home plus Eliquis  5mg  BID, reports adherence to regimen  (awaiting AM dose catchup here since she was sent to the ED before she could take this, notified nurse to administer ASAP) - will need to follow Hgb/plt count, may be due in part to extensive arm bruising - discussed with Dr. Okey - we will initiate IV amiodarone  drip without a bolus and follow, hold home amio. If she does not convert may need to involve EP in AM - check baseline CXR given ongoing amiodarone  use - addendum: TSH slightly up, check FT4 with AM labs  2. ESRD on HD with access site bruising - recent progression in severe RLS has contributed to issues with infiltration at HD - further per primary team  3. New thrombocytopenia, slight worsening of chronic anemia - IM has ordered further labs - aside from L arm brusing, patient denies any significant bleeding - will need to follow closely on OAC  4. Severe AS s/p TAVR 04/2024 - echo 05/04/24 felt to be stable with plan for f/u study 06/16/24 - continue with OP f/u with structural team  5. H/o Factor V Leyden heterozygous + Prothrombin II mutation  - on Eliquis    Risk Assessment/Risk Scores:      CHA2DS2-VASc Score = 4   This indicates a 4.8% annual risk of stroke. The patient's score is based upon: CHF History: 0 HTN History: 1 Diabetes History: 0 Stroke History: 0 Vascular Disease History: 1 Age Score: 1 Gender Score: 1     For questions or updates, please contact [facility] Please consult [email or web address] for contact info under    Signed, Dayna N Dunn, PA-C  05/25/2024 3:36 PM  Patient seen and examined   I agree with findings as noted by D Dunn above    Pt is a 68 yo with ESRD, recurrent thrombosis due to Factor V Leiden, AV dz/ (s/p TAVR in Aug 2025), LBBB after TAVR and PAF SHe was previously followed at [facility], now here for TAVR   She has been maintained on amiodarone  po  She was in HD moved and got infiltrate  in [place] Today in ER was tachycardic in atrial flutter with RVR  120s  Pt does not  sense  ON exam Neck JVP is normal Lungs are CTA Cardiac RRR  Tachycardic Abd   Benign Ext   L arm with bruising, hematoma around fistula site   Hand warm  Impression Atrial flutter with 2:1 conduction  Pt does not sense  Hemodynamics OK   May be triggered by events with arm. Would recomm IV amiodarone  to slow    Continue ELiquis  Follow CBC/platelets     Long term should be seen in afib/EP clinic for follow up and long term plans  Vina Okey MD

## 2024-05-25 NOTE — ED Notes (Addendum)
 PT states  I do not know why you guys are keeping me here. Yes I'm getting some medicine but it isn't helping. My heart rate is still elevated and if someone doesn't discharge me I'm going to be ripping all of this stuff off and leaving. I informed her I will message her MD to come see her.

## 2024-05-25 NOTE — ED Notes (Signed)
 EDP at [facility]

## 2024-05-25 NOTE — H&P (Addendum)
 History and Physical    Patient: Whitney Chavez FMW:[id number] DOB: 01/02/1956 DOA: 05/25/2024 DOS: the patient was seen and examined on 05/25/2024 PCP: Cleotilde Planas, MD  Patient coming from: Home  Chief Complaint:  Chief Complaint  Patient presents with   Vascular Access Problem   HPI: Whitney Chavez is a 68 y.o. female with medical history significant of  ESRD (HD MWF), severe restless legs, Factor V Leyden heterozygous + Prothrombin II mutation on Eliquis , PAF, anemia of chronic disease, HLD, HTN, OSA on CPAP and severe aortic stenosis s/p TAVR on 05/03/2024 p/w Aflutter.  The patient has been undergoing dialysis and had an aortic valve replacement on August 19th performed by Dr. Eleanor. The patient reported having restless legs and severe neuropathy. During dialysis last Wednesday, the patient involuntarily moved, resulting in infiltration of the arm. The infiltration occurred again on Friday due to severe restless legs. The patient noted that the infiltration site has been improving over the past few days. During dialysis today, the patient was informed about an elevated heart rate, which led to a decision to monitor the patient throughout the session. Despite the elevated heart rate, the patient did not experience any symptoms of atrial fibrillation and expressed disappointment that the high heart rate prevented a scheduled cataract removal. The patient was advised not to drive to the emergency room due to the recent heart surgery and infiltration, and emergency services were called to transport the patient to the ED for eval of elevated HR.  In the ED, pt hypotensive, tachycardic, and tachypneic W/O hypoxia. Labs notable for Cr 3.02 and INR 1.3. EDP consulted Cards for new Aflutter and requested medicine admission.    Review of Systems: As mentioned in the history of present illness. All other systems reviewed and are negative. Past Medical History:  Diagnosis Date   Asthma    Axillary  vein thrombosis (HCC) 08/16/2015   Calciphylaxis    Chronic kidney disease    Colitis    COPD (chronic obstructive pulmonary disease) (HCC)    Factor V Leiden (HCC)    Hypertension    Lymphedema    legs   MRSA (methicillin resistant staph aureus) culture positive    Neuropathy    Obstructive sleep apnea 03/29/2015   OSA on CPAP    S/P TAVR (transcatheter aortic valve replacement) 05/03/2024   s/p TAVR with a 29 mm Medtronic Evlout FX + THV via the TF approach by Dr. Wendel and lightfoot   Severe aortic stenosis    Vision abnormalities    Past Surgical History:  Procedure Laterality Date   APPLICATION OF WOUND VAC     AV FISTULA PLACEMENT Left    BIOPSY  11/05/2022   Procedure: BIOPSY;  Surgeon: Burnette Fallow, MD;  Location: [facility];  Service: Gastroenterology;;   COLONOSCOPY WITH PROPOFOL  Bilateral 11/05/2022   Procedure: COLONOSCOPY WITH PROPOFOL ;  Surgeon: Burnette Fallow, MD;  Location: [facility];  Service: Gastroenterology;  Laterality: Bilateral;   CORONARY ANGIOGRAPHY N/A 03/29/2024   Procedure: CORONARY ANGIOGRAPHY;  Surgeon: Wendel Lurena POUR, MD;  Location: [facility];  Service: Cardiovascular;  Laterality: N/A;   ESOPHAGOGASTRODUODENOSCOPY (EGD) WITH PROPOFOL  Bilateral 03/19/2022   Procedure: ESOPHAGOGASTRODUODENOSCOPY (EGD) WITH PROPOFOL ;  Surgeon: Burnette Fallow, MD;  Location: [facility];  Service: Gastroenterology;  Laterality: Bilateral;   INTRAOPERATIVE TRANSTHORACIC ECHOCARDIOGRAM N/A 05/03/2024   Procedure: ECHOCARDIOGRAM, TRANSTHORACIC;  Surgeon: Wendel Lurena POUR, MD;  Location: [facility];  Service: Cardiovascular;  Laterality: N/A;   LOWER  EXTREMITY ANGIOGRAPHY  03/29/2024   Procedure: Lower Extremity Angiography;  Surgeon: Wendel Lurena POUR, MD;  Location: [facility];  Service: Cardiovascular;;   lumbar decompression fusion     TONSILLECTOMY     UPPER EXTREMITY VENOGRAPHY  03/29/2024   Procedure: UPPER EXTREMITY VENOGRAPHY;   Surgeon: Wendel Lurena POUR, MD;  Location: [facility];  Service: Cardiovascular;;   Social History:  reports that she quit smoking about 25 years ago. Her smoking use included cigarettes. She started smoking about 55 years ago. She has a 60 pack-year smoking history. She has never used smokeless tobacco. She reports that she does not drink alcohol and does not use drugs.  Allergies  Allergen Reactions   Estrogens Other (See Comments)    Blood clots   Other Other (See Comments)    Unable to take birth control pills due to clotting disorder/fim   Prednisone Other (See Comments)    Facial edema - has had this 2020 and had no difficulty can take in small doses   Progesterone Other (See Comments)    Blood clots   Propofol  Other (See Comments)    Became disinhibited and exacerbated restless leg syndrome during EGD/colonoscopy. Patient has requested an alternate form of sedation during future procedures.  Legs started thrashing and skin tears.   Lisinopril Cough   Clotrimazole Rash   Tape Rash and Other (See Comments)    Please use paper tape    Family History  Problem Relation Age of Onset   Diabetes Mother    Congestive Heart Failure Mother    COPD Mother    Stroke Mother    Neuropathy Mother    Heart disease Father    Stroke Father    Cancer Brother    Diabetes Brother    Diabetes Brother    Alcohol abuse Brother     Prior to Admission medications   Medication Sig Start Date End Date Taking? Authorizing Provider  acetaminophen  (TYLENOL ) 500 MG tablet Take 500 mg by mouth every 6 (six) hours as needed for moderate pain.    [provider]  amiodarone  (PACERONE ) 200 MG tablet Take 200 mg by mouth in the morning. 12/28/18   [provider]  amoxicillin  (AMOXIL ) 500 MG tablet Take 4 tablets (2,000 mg total) by mouth as directed. 1 hour prior to dental work including cleanings 05/11/24   Sebastian Lamarr SAUNDERS, PA-C  apixaban  (ELIQUIS ) 5 MG TABS tablet Take 1  tablet (5 mg total) by mouth 2 (two) times daily. 10/13/22 04/27/27  Timmy Maude SAUNDERS, MD  atorvastatin  (LIPITOR) 20 MG tablet Take 20 mg by mouth every evening. 01/03/19   [provider]  BREYNA  160-4.5 MCG/ACT inhaler Inhale 2 puffs into the lungs 2 (two) times daily. 09/04/23   Kassie Acquanetta Bradley, MD  cetirizine (ZYRTEC) 10 MG tablet Take 10 mg by mouth daily in the afternoon.    [provider]  Elastic Bandages & Supports (MEDICAL COMPRESSION STOCKINGS) MISC by Does not apply route. Leg wraps.    [provider]  epoetin alfa (EPOGEN) 2000 UNIT/ML injection 2,000 Units 3 (three) times a week.    [provider]  Etelcalcetide HCl (PARSABIV IV) Inject 1 Dose into the vein 3 (three) times a week. Receives at Dialysis    [provider]  fluocinonide ointment (LIDEX) 0.05 % Apply 1 Application topically 2 (two) times daily as needed (psoriasis). 03/10/24   [provider]  gabapentin  (NEURONTIN ) 100 MG capsule Take 1  capsule (100 mg total) by mouth 4 (four) times daily. Patient taking differently: Take 100-200 mg by mouth 2 (two) times daily. In the afternoon and in the evening 08/04/23   Lomax, Amy, NP  lanthanum  (FOSRENOL ) 500 MG chewable tablet Chew 500-1,000 mg by mouth See admin instructions. Take 1-2 tablets ([id number] mg) by mouth with each meal & take 1 tablet (500 mg) with snacks.    [provider]  multivitamin (RENA-VIT) TABS tablet Take 1 tablet by mouth daily in the afternoon.    [provider]  naphazoline-pheniramine (ALLERGY EYE) 0.025-0.3 % ophthalmic solution Place 1-2 drops into both eyes 4 (four) times daily as needed for eye irritation or allergies.    [provider]  neomycin-bacitracin-polymyxin (NEOSPORIN) OINT Apply 1 Application topically as needed for wound care.    [provider]  omeprazole (PRILOSEC) 20 MG capsule Take 20 mg by mouth daily as needed (indigestion/heartburn.).     [provider]  ondansetron  (ZOFRAN -ODT) 4 MG disintegrating tablet Take 4 mg by mouth every Monday, Wednesday, and Friday.    [provider]  oxyCODONE  (ROXICODONE ) 5 MG immediate release tablet Take one po qd for more severe pain 05/23/24   Sater, Charlie LABOR, MD  PRESCRIPTION MEDICATION Inhale into the lungs at bedtime. CPAP with nasal pillow    [provider]  rOPINIRole  (REQUIP ) 0.5 MG tablet TAKE 1-2 TABLETS BY MOUTH IN THE MORNING. Patient taking differently: Take 0.5 mg by mouth daily in the afternoon. 08/04/23   Lomax, Amy, NP  rOPINIRole  (REQUIP ) 2 MG tablet Take 1 tablet (2 mg total) by mouth at bedtime. 08/04/23   Lomax, Amy, NP  SODIUM THIOSULFATE IV Inject 1 Dose into the vein 2 (two) times a week. Receives at Dialysis    [provider]  sulfaSALAzine  (AZULFIDINE ) 500 MG tablet Take 1,000 mg by mouth 2 (two) times daily. 02/15/15   [provider]  traMADol  (ULTRAM ) 50 MG tablet TAKE 1 TABLET (50 MG TOTAL) BY MOUTH 2 (TWO) TIMES DAILY AS NEEDED (PAIN). 12/17/23   Sater, Charlie LABOR, MD  triamcinolone cream (KENALOG) 0.1 % Apply 1 Application topically daily as needed (itching). 08/31/18   [provider]  umeclidinium bromide  (INCRUSE ELLIPTA ) 62.5 MCG/ACT AEPB Inhale 1 puff into the lungs daily. 09/03/23   Kassie Acquanetta Bradley, MD    Physical Exam: Vitals:   05/25/24 1305 05/25/24 1310 05/25/24 1315 05/25/24 1320  BP:      Pulse: (!) 125 (!) 122 (!) 126 (!) 123  Resp: (!) 22 17 17 12   SpO2: 98% 97% 98% 98%  Weight:       General: Alert, oriented x3, resting comfortably in no acute distress Respiratory: Lungs clear to auscultation bilaterally with normal respiratory effort; no w/r/r Cardiovascular: Regular rate and rhythm w/o m/r/g   Data Reviewed:  Lab Results  Component Value Date   WBC 5.3 05/25/2024   HGB 9.7 (L) 05/25/2024   HCT 31.7 (L) 05/25/2024   MCV 93.5 05/25/2024   PLT 81 (L) 05/25/2024   Lab Results   Component Value Date   GLUCOSE 84 05/25/2024   CALCIUM  9.3 05/25/2024   NA 135 05/25/2024   K 3.7 05/25/2024   CO2 23 05/25/2024   CL 98 05/25/2024   BUN 18 05/25/2024   CREATININE 3.02 (H) 05/25/2024   Lab Results  Component Value Date   ALT 7 05/25/2024   AST 22 05/25/2024   ALKPHOS 63 05/25/2024   BILITOT 1.0 05/25/2024  Lab Results  Component Value Date   INR 1.3 (H) 05/25/2024   INR 1.2 05/03/2024   INR 1.2 04/29/2024   Radiology: No results found.  Assessment and Plan: 47F h/o ESRD (HD MWF), severe restless legs, Factor V Leyden heterozygous + Prothrombin II mutation on Eliquis , PAF, anemia of chronic disease, HLD, HTN, OSA on CPAP and severe aortic stenosis s/p TAVR on 05/03/2024 p/w Aflutter.  Aflutter -Cards consulted; apprec eval/recs -PTA Eliquis  5mg  BID -PTA amiodarone  200mg  daily  Thrombocytopenia High suspicion for consumption related to recent infiltration of dialysis line and large LUE hematoma -CTM for now -F/u CBC daily -F/u peripheral smear -F/u B12 and folate  COPD -Breztri daily and Duobnebs prn  RLS -PTA ropinirole   ESRD -Renal consulted; apprec eval/recs  OSA -CPAP at bedtime   Advance Care Planning:   Code Status: Full Code   Consults: Cards  Family Communication: Daughter  Severity of Illness: The appropriate patient status for this patient is INPATIENT. Inpatient status is judged to be reasonable and necessary in order to provide the required intensity of service to ensure the patient's safety. The patient's presenting symptoms, physical exam findings, and initial radiographic and laboratory data in the context of their chronic comorbidities is felt to place them at high risk for further clinical deterioration. Furthermore, it is not anticipated that the patient will be medically stable for discharge from the hospital within 2 midnights of admission.   * I certify that at the point of admission it is my clinical judgment that  the patient will require inpatient hospital care spanning beyond 2 midnights from the point of admission due to high intensity of service, high risk for further deterioration and high frequency of surveillance required.*   ------- I spent 60 minutes reviewing previous notes, at the bedside counseling/discussing the treatment plan, and performing clinical documentation.  Author: Marsha Ada, MD 05/25/2024 2:29 PM  For on call review [email or web address].

## 2024-05-25 NOTE — ED Triage Notes (Addendum)
 BIB [facility] from HD center, sent by staff for afib, and LUE vasc access bruising, onset after using last week. Bruising migrating with gravity, but not worsening. HR afib, irregular, 118-120. VSS. Denies pain, sob, nausea, dizziness or other sx. Alert, NAD, calm, interactive, skin W&D, resps e/u, speaking in clear complete sentences. Denies sx or complaints. Verbalizes I live alone and want to make sure I am safe to go home alone. Reports aortic valve replacement in August 8/16. States, I jumped or moved with HD last week and it jarred the needle in arm and caused bruising swelling. Full HD course today w/o complication or difficulty.  +bruit/thrill LUE. Bruising and swelling noted, bruising in L FA present.

## 2024-05-25 NOTE — ED Notes (Signed)
 2nd call: Lab notified via phone of add-ons: folate & B12, will run

## 2024-05-25 NOTE — ED Notes (Addendum)
 PT has taken all leads and monitoring equipment off at this time.Primary RN has been made aware.

## 2024-05-25 NOTE — ED Provider Notes (Signed)
 [place] EMERGENCY DEPARTMENT AT [facility] Provider Note   CSN: [id number] Arrival date & time: 05/25/24  1121     Patient presents with: Vascular Access Problem   Whitney Chavez is a 68 y.o. female.   HPI   Patient presents due to concern for arrhythmia as well as concern for bruising of her left arm.  In terms of bruising of the left arm, patient has AV fistula in the site.  Patient states that she subsequently infiltrated the area while getting dialysis about a week ago whenever she accidentally fell asleep and moved her arm really quick.  Patient states that since then, she had a big hematoma that became more proximal to the actual AV site at that had since improved but she continues to have significant bruising.  Patient states that she has been having dialysis through this AV fistula without any kind of complications since then.  States he is able to get through all of dialysis.  Most recent dialysis today.   Also states that she was told that she was in possible A-fib today at clinic.  She was in clinic on Monday as well and was not found to be in A-fib.  Patient states that she is not aware whenever she goes into A-fib or flutter.  She states that she was compliant with her amiodarone .  Last took it today while at dialysis.  Patient also states that she is compliant with her Eliquis .  Has not missed any recent doses.  Last missed dose was before surgery back in August.  Patient states that she was bridged with heparin  and started taking Eliquis  the day after surgery.  Currently denies any chest pain or shortness of breath.    Previous medical history reviewed : Patient last admitted and discharged May 07, 2024.  Status post TAVR.  Factor V Leiden mutation.  End-stage renal disease on hemodialysis.  Recommended ongoing use of Eliquis  5 mg twice daily.  History of proximal atrial fibrillation.  Recommended ongoing amiodarone  200 mg daily.  Sinus while inpatient.    Prior to Admission medications   Medication Sig Start Date End Date Taking? Authorizing Provider  acetaminophen  (TYLENOL ) 500 MG tablet Take 500 mg by mouth every 6 (six) hours as needed for moderate pain.    [provider]  amiodarone  (PACERONE ) 200 MG tablet Take 200 mg by mouth in the morning. 12/28/18   [provider]  amoxicillin  (AMOXIL ) 500 MG capsule Take 4 capsules by mouth as directed. 1 hour prior to dental work including cleanings 05/11/24   [provider]  apixaban  (ELIQUIS ) 5 MG TABS tablet Take 1 tablet (5 mg total) by mouth 2 (two) times daily. 10/13/22 04/27/27  Timmy Maude SAUNDERS, MD  atorvastatin  (LIPITOR) 20 MG tablet Take 20 mg by mouth every evening. 01/03/19   [provider]  BREYNA  160-4.5 MCG/ACT inhaler Inhale 2 puffs into the lungs 2 (two) times daily. 09/04/23   Kassie Acquanetta Bradley, MD  cetirizine (ZYRTEC) 10 MG tablet Take 10 mg by mouth daily in the afternoon.    [provider]  Elastic Bandages & Supports (MEDICAL COMPRESSION STOCKINGS) MISC by Does not apply route. Leg wraps.    [provider]  epoetin alfa (EPOGEN) 2000 UNIT/ML injection 2,000 Units 3 (three) times a week.    [provider]  Etelcalcetide HCl (PARSABIV IV) Inject 1 Dose into the vein 3 (three) times a week. Receives at Dialysis    [provider]  fluocinonide ointment (LIDEX) 0.05 % Apply 1 Application topically 2 (two) times daily as needed (psoriasis). 03/10/24   [provider]  gabapentin  (NEURONTIN ) 100 MG capsule Take 1 capsule (100 mg total) by mouth 4 (four) times daily. Patient taking differently: Take 100-200 mg by mouth 2 (two) times daily. In the afternoon and in the evening 08/04/23   Lomax, Amy, NP  lanthanum  (FOSRENOL ) 500 MG chewable tablet Chew 500-1,000 mg by mouth See admin instructions. Take 1-2 tablets ([phone] mg) by mouth with each meal & take 1 tablet (500 mg) with snacks.    [provider]  multivitamin (RENA-VIT) TABS tablet Take 1 tablet by mouth daily in the afternoon.    [provider]  naphazoline-pheniramine (ALLERGY EYE) 0.025-0.3 % ophthalmic solution Place 1-2 drops into both eyes 4 (four) times daily as needed for eye irritation or allergies.    [provider]  neomycin-bacitracin-polymyxin (NEOSPORIN) OINT Apply 1 Application topically as needed for wound care.    [provider]  omeprazole (PRILOSEC) 20 MG capsule Take 20 mg by mouth daily as needed (indigestion/heartburn.).    [provider]  ondansetron  (ZOFRAN -ODT) 4 MG disintegrating tablet Take 4 mg by mouth every Monday, Wednesday, and Friday.    [provider]  oxyCODONE  (ROXICODONE ) 5 MG immediate release tablet Take one po qd for more severe pain 05/23/24   Sater, Charlie LABOR, MD  PRESCRIPTION MEDICATION Inhale into the lungs at bedtime. CPAP with nasal pillow    [provider]  rOPINIRole  (REQUIP ) 0.5 MG tablet TAKE 1-2 TABLETS BY MOUTH IN THE MORNING. Patient taking differently: Take 0.5 mg by mouth daily in the afternoon. 08/04/23   Lomax, Amy, NP  rOPINIRole  (REQUIP ) 2 MG tablet Take 1 tablet (2 mg total) by mouth at bedtime. 08/04/23   Lomax, Amy, NP  SODIUM THIOSULFATE IV Inject 1 Dose into the vein 2 (two) times a week. Receives at Dialysis    [provider]  sulfaSALAzine  (AZULFIDINE ) 500 MG tablet Take 1,000 mg by mouth 2 (two) times daily. 02/15/15   [provider]  traMADol  (ULTRAM ) 50 MG tablet TAKE 1 TABLET (50 MG TOTAL) BY MOUTH 2 (TWO) TIMES DAILY AS NEEDED (PAIN). 12/17/23   Sater, Charlie LABOR, MD  triamcinolone cream (KENALOG) 0.1 % Apply 1 Application topically daily as needed (itching). 08/31/18   [provider]  umeclidinium bromide  (INCRUSE ELLIPTA ) 62.5 MCG/ACT AEPB Inhale 1 puff into the lungs daily. 09/03/23   Kassie Acquanetta Bradley, MD    Allergies: Estrogens, Other, Prednisone, Progesterone,  Propofol , Lisinopril, Clotrimazole, and Tape    Review of Systems  Constitutional:  Negative for chills and fever.  HENT:  Negative for ear pain and sore throat.   Eyes:  Negative for pain and visual disturbance.  Respiratory:  Negative for cough and shortness of breath.   Cardiovascular:  Negative for chest pain and palpitations.  Gastrointestinal:  Negative for abdominal pain and vomiting.  Genitourinary:  Negative for dysuria and hematuria.  Musculoskeletal:  Negative for arthralgias and back pain.  Skin:  Negative for color change and rash.  Neurological:  Negative for seizures and syncope.  All other systems reviewed and are negative.   Updated Vital Signs BP 107/74   Pulse (!) 123   Resp 12   Wt 80.3 kg   SpO2 98%   BMI 34.57 kg/m   Physical Exam Vitals and nursing note reviewed.  Constitutional:      General: She is not in  acute distress.    Appearance: She is well-developed.  HENT:     Head: Normocephalic and atraumatic.  Eyes:     Conjunctiva/sclera: Conjunctivae normal.  Cardiovascular:     Rate and Rhythm: Normal rate and regular rhythm.     Heart sounds: No murmur heard. Pulmonary:     Effort: Pulmonary effort is normal. No respiratory distress.     Breath sounds: Normal breath sounds.  Abdominal:     Palpations: Abdomen is soft.     Tenderness: There is no abdominal tenderness.  Musculoskeletal:        General: No swelling.       Arms:     Cervical back: Neck supple.  Skin:    General: Skin is warm and dry.     Capillary Refill: Capillary refill takes less than 2 seconds.  Neurological:     Mental Status: She is alert.  Psychiatric:        Mood and Affect: Mood normal.       Media Information  Document Information  Photos    05/25/2024 11:47  Attached To:  Hospital Encounter on 05/25/24  Source Information  Simon Lavonia SAILOR, MD  [facility]     Media Information  Document Information  Photos    05/25/2024 11:47  Attached  To:  Hospital Encounter on 05/25/24  Source Information  Simon Lavonia SAILOR, MD  [facility]      (all labs ordered are listed, but only abnormal results are displayed) Labs Reviewed  COMPREHENSIVE METABOLIC PANEL WITH GFR - Abnormal; Notable for the following components:      Result Value   Creatinine, Ser 3.02 (*)    Albumin 3.4 (*)    GFR, Estimated 16 (*)    All other components within normal limits  CBC WITH DIFFERENTIAL/PLATELET - Abnormal; Notable for the following components:   RBC 3.39 (*)    Hemoglobin 9.7 (*)    HCT 31.7 (*)    RDW 17.0 (*)    Platelets 81 (*)    All other components within normal limits  PROTIME-INR - Abnormal; Notable for the following components:   Prothrombin Time 16.8 (*)    INR 1.3 (*)    All other components within normal limits  MAGNESIUM   APTT  LACTATE DEHYDROGENASE  HAPTOGLOBIN  HIV ANTIBODY (ROUTINE TESTING W REFLEX)  CBC  CREATININE, SERUM  TSH    EKG: None  Radiology: No results found.   Procedures   Medications Ordered in the ED  amiodarone  (PACERONE ) tablet 200 mg (has no administration in time range)  apixaban  (ELIQUIS ) tablet 5 mg (has no administration in time range)  atorvastatin  (LIPITOR) tablet 20 mg (has no administration in time range)  fluticasone  furoate-vilanterol (BREO ELLIPTA ) 200-25 MCG/ACT 1 puff (has no administration in time range)  loratadine  (CLARITIN ) tablet 10 mg (has no administration in time range)  lanthanum  (FOSRENOL ) chewable tablet 500-1,000 mg (has no administration in time range)  multivitamin (RENA-VIT) tablet 1 tablet (has no administration in time range)  pantoprazole  (PROTONIX ) EC tablet 40 mg (has no administration in time range)  oxyCODONE  (Oxy IR/ROXICODONE ) immediate release tablet 5 mg (has no administration in time range)  rOPINIRole  (REQUIP ) tablet 0.5 mg (has no administration in time range)  ipratropium-albuterol  (DUONEB) 0.5-2.5 (3) MG/3ML nebulizer solution 3 mL (has no  administration in time range)  sodium chloride  0.9 % bolus 500 mL (0 mLs Intravenous Stopped 05/25/24 1257)  oxyCODONE  (Oxy IR/ROXICODONE ) immediate release tablet 5 mg (5 mg  Oral Given 05/25/24 1307)    Clinical Course as of 05/25/24 1519  Wed May 25, 2024  1149 2 l off today at dialysis per her report  [TL]  1203 Per vascular surgery Dr Gretta: wrap it at home. Otherwise NTD. If increasing pain, need cath if having a lot of pain or not able to access.  [TL]    Clinical Course User Index [TL] Simon Lavonia SAILOR, MD                                 Medical Decision Making Amount and/or Complexity of Data Reviewed Labs: ordered.  Risk Prescription drug management. Decision regarding hospitalization.      Patient presents due to concern for arrhythmia as well as concern for bruising of her left arm.  In terms of bruising of the left arm, patient has AV fistula in the site.  Patient states that she subsequently infiltrated the area while getting dialysis about a week ago whenever she accidentally fell asleep and moved her arm really quick.  Patient states that since then, she had a big hematoma that became more proximal to the actual AV site at that had since improved but she continues to have significant bruising.  Patient states that she has been having dialysis through this AV fistula without any kind of complications since then.  States he is able to get through all of dialysis.  Most recent dialysis today.   Also states that she was told that she was in possible A-fib today at clinic.  She was in clinic on Monday as well and was not found to be in A-fib.  Patient states that she is not aware whenever she goes into A-fib or flutter.  She states that she was compliant with her amiodarone .  Last took it today while at dialysis.  Patient also states that she is compliant with her Eliquis .  Has not missed any recent doses.  Last missed dose was before surgery back in August.  Patient states that  she was bridged with heparin  and started taking Eliquis  the day after surgery.  Currently denies any chest pain or shortness of breath.    Previous medical history reviewed : Patient last admitted and discharged May 07, 2024.  Status post TAVR.  Factor V Leiden mutation.  End-stage renal disease on hemodialysis.  Recommended ongoing use of Eliquis  5 mg twice daily.  History of proximal atrial fibrillation.  Recommended ongoing amiodarone  200 mg daily.  Sinus while inpatient.    In terms of patient's bruising of her left arm.  Been present for about the past week so.  Patient states that the hematoma more proximal to the AV fistula site section and decreasing in size.  Consulted vascular surgery Dr. Gretta.  Discussed the patient.  No intervention needed at this point in time.  As long as she is not having much pain in this area as well as the AV fistula is working then there is nothing to do.  No concerns for compartment syndrome.  Can keep the area wrapped but otherwise no acute intervention needed.    In terms of arrhythmia.  Question whether not this could be a flutter given that it so regular at 122.  Patient currently anticoagulated as well as on amiodarone  does have a history of A-fib.  Patient reports taking Eliquis  ever since she was discharged from her TAVR procedure.  Sounds like they bridged her  with heparin  before the TAVR and started.  Hesitant to just cardiovert.  She was initially slightly hypotensive but this is likely because of fluid being taken off.  Therefore, gave patient 500 cc of normal saline bag.  Maps returned to normal.  Consulted cardiology.  They will see the patient.  They recommended inpatient admission and they will follow along for the A-flutter.  No indication for emergent cardioversion.   Patient does have new thrombocytopenia.  Unclear etiology of this.  Very low concerns for HIT given how long has been since she was last received heparin .  Did add on haptoglobin  as well as LDH.  Slightly increasing anemia as well.  No bright red blood per rectum or dark stools.   Patient admitted for further care.           Final diagnoses:  Typical atrial flutter (HCC)  Anemia, unspecified type  Thrombocytopenia [facility])    ED Discharge Orders     None          Simon Lavonia SAILOR, MD 05/25/24 1521

## 2024-05-25 NOTE — ED Notes (Addendum)
 Lab notified via phone of add-ons: folate & B12, will run

## 2024-05-26 DIAGNOSIS — I483 Typical atrial flutter: Secondary | ICD-10-CM | POA: Diagnosis not present

## 2024-05-26 DIAGNOSIS — I4892 Unspecified atrial flutter: Secondary | ICD-10-CM | POA: Diagnosis not present

## 2024-05-26 DIAGNOSIS — I4891 Unspecified atrial fibrillation: Secondary | ICD-10-CM | POA: Diagnosis present

## 2024-05-26 LAB — CBC
HCT: 32.6 % — ABNORMAL LOW (ref 36.0–46.0)
Hemoglobin: 9.8 g/dL — ABNORMAL LOW (ref 12.0–15.0)
MCH: 29.1 pg (ref 26.0–34.0)
MCHC: 30.1 g/dL (ref 30.0–36.0)
MCV: 96.7 fL (ref 80.0–100.0)
Platelets: 88 K/uL — ABNORMAL LOW (ref 150–400)
RBC: 3.37 MIL/uL — ABNORMAL LOW (ref 3.87–5.11)
RDW: 17.2 % — ABNORMAL HIGH (ref 11.5–15.5)
WBC: 4.9 K/uL (ref 4.0–10.5)
nRBC: 0 % (ref 0.0–0.2)

## 2024-05-26 LAB — T4, FREE: Free T4: 1.03 ng/dL (ref 0.61–1.12)

## 2024-05-26 LAB — HAPTOGLOBIN: Haptoglobin: 75 mg/dL (ref 37–355)

## 2024-05-26 MED ORDER — SULFASALAZINE 500 MG PO TABS
1000.0000 mg | ORAL_TABLET | Freq: Two times a day (BID) | ORAL | Status: DC
  Filled 2024-05-26 (×2): qty 2

## 2024-05-26 MED ORDER — AMIODARONE HCL 200 MG PO TABS: 200.0000 mg | ORAL_TABLET | Freq: Two times a day (BID) | ORAL | 0 refills | Status: DC

## 2024-05-26 MED ORDER — AMIODARONE IV BOLUS ONLY 150 MG/100ML
150.0000 mg | Freq: Once | INTRAVENOUS | Status: AC
  Administered 2024-05-26: 150 mg via INTRAVENOUS
  Filled 2024-05-26: qty 100

## 2024-05-26 MED ORDER — UMECLIDINIUM BROMIDE 62.5 MCG/ACT IN AEPB: 1.0000 | INHALATION_SPRAY | Freq: Every day | RESPIRATORY_TRACT | Status: DC

## 2024-05-26 NOTE — ED Notes (Signed)
 Patient upset and requesting to see a doctor. Pt states she wants to leave and feels like she isn't getting any better while here. Pt informed that MD was paged. Pt also informed that she can leave AMA if she doesn't want to stay and wait for doctor. Pt states she will wait to see dr.  WRAY- MD to see patient around 0900 on rounds.

## 2024-05-26 NOTE — Progress Notes (Signed)
 Rounding Note   Patient Name: Whitney Chavez Date of Encounter: 05/26/2024 [facility] Cardiologist: Lurena MARLA Red, MD   Subjective Pt upset   Wants to leave  I am not getting better here  Scheduled Meds:  apixaban   5 mg Oral BID   atorvastatin   20 mg Oral QPM   fluticasone  furoate-vilanterol  1 puff Inhalation Daily   lanthanum   1,000 mg Oral TID WC   And   lanthanum   500 mg Oral With snacks   loratadine   10 mg Oral Daily   multivitamin  1 tablet Oral Q1500   pantoprazole   40 mg Oral Daily   rOPINIRole   0.5 mg Oral Q1500   Continuous Infusions:  amiodarone  30 mg/hr (05/25/24 2240)   PRN Meds: ipratropium-albuterol , oxyCODONE    Vital Signs  Vitals:   05/25/24 1630 05/25/24 2100 05/26/24 0301 05/26/24 0804  BP: 104/68 100/76 122/86 120/75  Pulse: (!) 121 (!) 114 (!) 109 (!) 111  Resp: 18 13 18 20   Temp:  97.6 F (36.4 C) (!) 97.3 F (36.3 C) 97.9 F (36.6 C)  TempSrc:  Oral Oral Oral  SpO2: 96% 98% 97% 98%  Weight:        Intake/Output Summary (Last 24 hours) at 05/26/2024 0916 Last data filed at 05/25/2024 1257 Gross per 24 hour  Intake 500 ml  Output --  Net 500 ml      05/25/2024   11:39 AM 05/11/2024   12:50 PM 05/03/2024   10:58 AM  Last 3 Weights  Weight (lbs) 177 lb 177 lb 180 lb  Weight (kg) 80.287 kg 80.287 kg 81.647 kg      Telemetry Atrial flutter   2:1   HR 110s to 120s   - Personally Reviewed  ECG  No new  - Personally Reviewed  Physical Exam  GEN: No acute distress.   Neck: No JVD  Cardiac: RRR, tachy  Respiratory: Clear to auscultation GI: Soft, nontender, Ext  R arm with hematoma      Labs High Sensitivity Troponin:  No results for input(s): TROPONINIHS in the last 720 hours.   Chemistry Recent Labs  Lab 05/25/24 1205 05/25/24 1525  NA 135  --   K 3.7  --   CL 98  --   CO2 23  --   GLUCOSE 84  --   BUN 18  --   CREATININE 3.02* 3.31*  CALCIUM  9.3  --   MG 1.9  --   PROT 6.7  --   ALBUMIN 3.4*  --    AST 22  --   ALT 7  --   ALKPHOS 63  --   BILITOT 1.0  --   GFRNONAA 16* 15*  ANIONGAP 14  --     Lipids No results for input(s): CHOL, TRIG, HDL, LABVLDL, LDLCALC, CHOLHDL in the last 168 hours.  Hematology Recent Labs  Lab 05/25/24 1205 05/25/24 1525 05/26/24 0423  WBC 5.3 6.0 4.9  RBC 3.39* 3.38* 3.37*  HGB 9.7* 10.1* 9.8*  HCT 31.7* 31.6* 32.6*  MCV 93.5 93.5 96.7  MCH 28.6 29.9 29.1  MCHC 30.6 32.0 30.1  RDW 17.0* 17.2* 17.2*  PLT 81* 88* 88*   Thyroid  Recent Labs  Lab 05/25/24 1205 05/26/24 0423  TSH 7.826*  --   FREET4  --  1.03    BNPNo results for input(s): BNP, PROBNP in the last 168 hours.  DDimer No results for input(s): DDIMER in the last 168 hours.  Radiology  DG Chest 2 View Result Date: 05/25/2024 CLINICAL DATA:  History of atrial flutter on long-term use of amiodarone . EXAM: CHEST - 2 VIEW COMPARISON:  April 29, 2024 FINDINGS: The heart size and mediastinal contours are within normal limits. An artificial aortic valve is seen. No acute infiltrate, pleural effusion or pneumothorax is identified. Degenerative changes are present involving both shoulders. No acute osseous abnormality is identified. IMPRESSION: 1. Evidence of prior aortic valve replacement. 2. No acute cardiopulmonary disease. Electronically Signed   By: Suzen Dials M.D.   On: 05/25/2024 17:08    Cardiac Studies  TTE  AUg 2025   1. Left ventricular ejection fraction, by estimation, is 70 to 75%. The  left ventricle has hyperdynamic function. The left ventricle has no  regional wall motion abnormalities. There is mild concentric left  ventricular hypertrophy. Left ventricular  diastolic parameters are consistent with Grade I diastolic dysfunction  (impaired relaxation). The average left ventricular global longitudinal  strain is -18.8 %. The global longitudinal strain is normal.   2. Right ventricular systolic function is normal. The right ventricular  size  is normal. There is normal pulmonary artery systolic pressure.   3. Left atrial size was mildly dilated.   4. The mitral valve is abnormal. No evidence of mitral valve  regurgitation. No evidence of mitral stenosis. Moderate mitral annular  calcification.   5. The aortic valve has been repaired/replaced. Aortic valve  regurgitation is not visualized. No aortic stenosis is present. Echo  findings are consistent with normal structure and function of the aortic  valve prosthesis. Aortic valve mean gradient  measures 15.5 mmHg. Aortic valve Vmax measures 2.73 m/s.   6. The inferior vena cava is normal in size with greater than 50%  respiratory variability, suggesting right atrial pressure of 3 mmHg.   Patient Profile   Whitney Chavez is a 68 y.o. 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 Simon.   Assessment & Plan  1  Atrial flutter   Pt has had intermittently in past   Brought to ER yesterday from dialysis when HR high    SHe has been on amiodarone    EKG from Aug she was in SR (narrow complex and LBBB after TAVR) SHe does not sense the palpitations  The pt says she has not missed any Eliquis  over the past few weeks    (in SR she was bridged due to Factor V Leiden)  Need to confirm   But she was in SR at the time    Recomm: I have increased amiodarone  to bolus then 60 mg per hour  Could consider DCCV if she does not convert and HR stays high    Keep NPO after MN Previous notes from [facility]-- patient has not tolerated b blockers due to hypotension during dialysis.  I have discussed with EP  THey will see patient re atrial  flutter and amio long term use   2  Hx Severe AS  Pt is s/p TAVR Aug 2025   Echo 05/04/24 above   Valve functioning well  3  ESRD  On dialysis   Had hematoma develop this  week in arm  4  Heme  Hx Factor V Leiden heterozyous and Prothrombin II mutation.   Plt 88 K  this week after bleed in arm  Had been normal in August.       2  For questions or updates, please contact [facility] Please consult [email or web address] for contact info under     Signed, Vina Gull, MD  05/26/2024, 9:16 AM

## 2024-05-26 NOTE — ED Notes (Signed)
New breakfast tray ordered for patient

## 2024-05-26 NOTE — Discharge Summary (Signed)
 Physician Discharge Summary  Whitney Chavez FMW:[id number] DOB: [birth year] DOA: 05/25/2024  PCP: Cleotilde Planas, MD  Admit date: 05/25/2024 Discharge date: 05/26/2024 30 Day Unplanned Readmission Risk Score    Flowsheet Row ED to Hosp-Admission (Current) from 05/25/2024 in [facility] Emergency Department at [facility]  30 Day Unplanned Readmission Risk Score (%) 17.58 Filed at 05/26/2024 1200    This score is the patient's risk of an unplanned readmission within 30 days of being discharged (0 -100%). The score is based on dignosis, age, lab data, medications, orders, and past utilization.   Low:  0-14.9   Medium: 15-21.9   High: 22-29.9   Extreme: 30 and above          Admitted From: Home  Disposition: Home  Recommendations for Outpatient Follow-up:  Follow up with PCP in 1-2 weeks Please obtain BMP/CBC in one week Follow-up with your primary cardiologist for consideration of atrial flutter ablation Please follow up with your PCP on the following pending results: Unresulted Labs (From admission, onward)     Start     Ordered   05/25/24 1415  Haptoglobin  Once,   URGENT        05/25/24 1414              Home Health: None  Equipment/Devices: None  Discharge Condition: Stable CODE STATUS: Full code Diet recommendation:  Diet Order             Diet Heart Room service appropriate? Yes; Fluid consistency: Thin  Diet effective now                 Due to brief hospitalization, I have copied admitting hospitalist HPI as below.  HPI: Whitney Chavez is a 68 y.o. female with medical history significant of  ESRD (HD MWF), severe restless legs, Factor V Leyden heterozygous + Prothrombin II mutation on Eliquis , PAF, anemia of chronic disease, HLD, HTN, OSA on CPAP and severe aortic stenosis s/p TAVR on 05/03/2024 p/w Aflutter.   The patient has been undergoing dialysis and had an aortic valve replacement on August 19th performed by Dr. Eleanor. The patient reported  having restless legs and severe neuropathy. During dialysis last Wednesday, the patient involuntarily moved, resulting in infiltration of the arm. The infiltration occurred again on Friday due to severe restless legs. The patient noted that the infiltration site has been improving over the past few days. During dialysis today, the patient was informed about an elevated heart rate, which led to a decision to monitor the patient throughout the session. Despite the elevated heart rate, the patient did not experience any symptoms of atrial fibrillation and expressed disappointment that the high heart rate prevented a scheduled cataract removal. The patient was advised not to drive to the emergency room due to the recent heart surgery and infiltration, and emergency services were called to transport the patient to the ED for eval of elevated HR.   In the ED, pt hypotensive, tachycardic, and tachypneic W/O hypoxia. Labs notable for Cr 3.02 and INR 1.3. EDP consulted Cards for new Aflutter and requested medicine admission.  Subjective: Patient seen and examined earlier since I received a message from the nurse the patient was very upset and wanted to leave AGAINST MEDICAL ADVICE.  I saw patient.  Patient had several several complaints.  In her words,  I am sick and tired of being here, this food that they have provided me, I am not supposed to eat anything, everything that  they have given me including banana and pudding has high potassium and I am not supposed to take them.  This room sucks, it is either too hot or too cold.  The nurses at night went to bed.  I keep calling them for IV pole beeping every second and no one will respond or come to my room for at least 30 minutes or longer.  I now regret having the valve replacement.  I have been dealing with hemodialysis for last 14 years.  I am just sick and tired have dealing with my medical problems which has worsened for last 3 years.  I would rather like to go home  than staying here because I think I am more upset here which is triggering my heart rate even higher and at home I will be calm and better.  Regardless, nurses did not take care of me here and I think I can take care of myself at home better.  I am sorry that I feel this way but I am okay with going home and trying, I am DNR and all my physicians know that including dialysis unit.  After listening to her for 10 minutes, I calmed her down, provided psychological support and offered her to share more if she wants to.  She then calmed down, apologized to me for seeing so many things.  Regardless of her coming down, she preferred to go home.  At the time I saw her, she was still waiting for EP to be seen.  Thankfully EP has cleared her for discharge.  Brief/Interim Summary: Patient was mainly admitted due to atrial flutter and A-fib.  Cardiology consulted.  Per them, she had atrial flutter with 2:1 conduction.  It was confirmed that patient was compliant to her amiodarone  dose at home.  She was admitted under hospital service, she was then started on amiodarone  drip.  Even on amiodarone  drip, patient's heart rate remained around 110 with rest however she remained completely asymptomatic, denying any chest pain, shortness of breath, palpitation or dizziness.  Cardiology consulted EP.  Patient was seen by Dr. Waddell.  Who discussed with the patient about all the options, he told the patient that the best option for her is flutter ablation however since patient was asymptomatic, cardiology was okay with her going home because patient really did not want to stay in the hospital any longer.  Cardiology recommended doubling up on her amiodarone  dose from 200 once daily to 200 twice daily and will have her follow-up with her cardiologist Dr. Kennyth for catheter ablation of both A-fib and atrial flutter.  Patient is being discharged in stable condition, no other changes in the medications.  Discharge plan was discussed  with patient and/or family member and they verbalized understanding and agreed with it.  Discharge Diagnoses:  Principal Problem:   Atrial flutter (HCC) Active Problems:   OSA on CPAP   Factor 5 Leiden mutation, heterozygous (HCC)   Restless legs    Discharge Instructions   Allergies as of 05/26/2024       Reactions   Estrogens Other (See Comments)   Blood clots   Other Other (See Comments)   Unable to take birth control pills due to clotting disorder/fim   Prednisone Other (See Comments)   Facial edema - has had this 2020 and had no difficulty can take in small doses   Progesterone Other (See Comments)   Blood clots   Propofol  Other (See Comments)   Became disinhibited and exacerbated  restless leg syndrome during EGD/colonoscopy. Patient has requested an alternate form of sedation during future procedures. Legs started thrashing and skin tears.   Lisinopril Cough   Clotrimazole Rash   Tape Rash, Other (See Comments)   Please use paper tape        Medication List     TAKE these medications    acetaminophen  500 MG tablet Commonly known as: TYLENOL  Take 500 mg by mouth every 6 (six) hours as needed for moderate pain.   Allergy Eye 0.025-0.3 % ophthalmic solution Generic drug: naphazoline-pheniramine Place 1-2 drops into both eyes 4 (four) times daily as needed for eye irritation or allergies.   amiodarone  200 MG tablet Commonly known as: PACERONE  Take 1 tablet (200 mg total) by mouth 2 (two) times daily. What changed: when to take this   amoxicillin  500 MG capsule Commonly known as: AMOXIL  Take 4 capsules by mouth as directed. 1 hour prior to dental work including cleanings   apixaban  5 MG Tabs tablet Commonly known as: Eliquis  Take 1 tablet (5 mg total) by mouth 2 (two) times daily.   atorvastatin  20 MG tablet Commonly known as: LIPITOR Take 20 mg by mouth every evening.   Breyna  160-4.5 MCG/ACT inhaler Generic drug: budesonide -formoterol  Inhale 2  puffs into the lungs 2 (two) times daily.   cetirizine 10 MG tablet Commonly known as: ZYRTEC Take 10 mg by mouth daily in the afternoon.   Epogen 2000 UNIT/ML injection Generic drug: epoetin alfa 2,000 Units 3 (three) times a week.   fluocinonide ointment 0.05 % Commonly known as: LIDEX Apply 1 Application topically 2 (two) times daily as needed (psoriasis).   gabapentin  100 MG capsule Commonly known as: NEURONTIN  Take 1 capsule (100 mg total) by mouth 4 (four) times daily. What changed:  how much to take when to take this additional instructions   Incruse Ellipta  62.5 MCG/ACT Aepb Generic drug: umeclidinium bromide  Inhale 1 puff into the lungs daily.   lanthanum  500 MG chewable tablet Commonly known as: FOSRENOL  Chew 500-1,000 mg by mouth See admin instructions. Take 1-2 tablets [phone] mg) by mouth with each meal & take 1 tablet (500 mg) with snacks.   Medical Compression Stockings Misc by Does not apply route. Leg wraps.   multivitamin Tabs tablet Take 1 tablet by mouth daily in the afternoon.   neomycin-bacitracin-polymyxin Oint Commonly known as: NEOSPORIN Apply 1 Application topically as needed for wound care.   omeprazole 20 MG capsule Commonly known as: PRILOSEC Take 20 mg by mouth daily as needed (indigestion/heartburn.).   ondansetron  4 MG disintegrating tablet Commonly known as: ZOFRAN -ODT Take 4 mg by mouth every Monday, Wednesday, and Friday.   oxyCODONE  5 MG immediate release tablet Commonly known as: Roxicodone  Take one po qd for more severe pain What changed:  how much to take how to take this when to take this reasons to take this   PARSABIV IV Inject 1 Dose into the vein 3 (three) times a week. Receives at Dialysis   PRESCRIPTION MEDICATION Inhale into the lungs at bedtime. CPAP with nasal pillow   rOPINIRole  2 MG tablet Commonly known as: REQUIP  Take 1 tablet (2 mg total) by mouth at bedtime. What changed: Another medication with  the same name was changed. Make sure you understand how and when to take each.   rOPINIRole  0.5 MG tablet Commonly known as: REQUIP  TAKE 1-2 TABLETS BY MOUTH IN THE MORNING. What changed:  how much to take how to take this when to take  this additional instructions   SODIUM THIOSULFATE IV Inject 1 Dose into the vein 2 (two) times a week. Receives at Dialysis   sulfaSALAzine  500 MG tablet Commonly known as: AZULFIDINE  Take 1,000 mg by mouth 2 (two) times daily.   traMADol  50 MG tablet Commonly known as: ULTRAM  TAKE 1 TABLET (50 MG TOTAL) BY MOUTH 2 (TWO) TIMES DAILY AS NEEDED (PAIN).   triamcinolone cream 0.1 % Commonly known as: KENALOG Apply 1 Application topically daily as needed (itching).        Follow-up Information     Cleotilde Planas, MD Follow up in 1 week(s).   Specialty: Family Medicine Contact information: [address] [place] [address] [phone]                Allergies  Allergen Reactions   Estrogens Other (See Comments)    Blood clots   Other Other (See Comments)    Unable to take birth control pills due to clotting disorder/fim   Prednisone Other (See Comments)    Facial edema - has had this 2020 and had no difficulty can take in small doses   Progesterone Other (See Comments)    Blood clots   Propofol  Other (See Comments)    Became disinhibited and exacerbated restless leg syndrome during EGD/colonoscopy. Patient has requested an alternate form of sedation during future procedures.  Legs started thrashing and skin tears.   Lisinopril Cough   Clotrimazole Rash   Tape Rash and Other (See Comments)    Please use paper tape    Consultations: Cardiology   Procedures/Studies: DG Chest 2 View Result Date: 05/25/2024 CLINICAL DATA:  History of atrial flutter on long-term use of amiodarone . EXAM: CHEST - 2 VIEW COMPARISON:  April 29, 2024 FINDINGS: The heart size and mediastinal contours are within normal limits. An  artificial aortic valve is seen. No acute infiltrate, pleural effusion or pneumothorax is identified. Degenerative changes are present involving both shoulders. No acute osseous abnormality is identified. IMPRESSION: 1. Evidence of prior aortic valve replacement. 2. No acute cardiopulmonary disease. Electronically Signed   By: Suzen Dials M.D.   On: 05/25/2024 17:08   ECHOCARDIOGRAM COMPLETE Result Date: 05/04/2024    ECHOCARDIOGRAM REPORT   Patient Name:   Whitney Chavez Date of Exam: 05/04/2024 Medical Rec #:  [id number]     Height:       60.0 in Accession #:    [id number]    Weight:       180.0 lb Date of Birth:  September 20, 1955    BSA:          1.785 m Patient Age:    28 years      BP:           120/51 mmHg Patient Gender: F             HR:           65 bpm. Exam Location:  Inpatient Procedure: 2D Echo, Cardiac Doppler, Color Doppler and Strain Analysis (Both            Spectral and Color Flow Doppler were utilized during procedure). Indications:    Post TAVR evaluation V43.3/Z95.2  History:        Patient has prior history of Echocardiogram examinations, most                 recent 05/03/2024. COPD and end-stage renal disease, 29 mm  stented (TAVR) valve is present in the aortic postion. Proecdure                 date: 05/03/2024; Risk Factors:Sleep Apnea.  Sonographer:    Koleen Popper RDCS Referring Phys: [id number] LAMARR SAUNDERS THOMPSON  Sonographer Comments: Global longitudinal strain was attempted. IMPRESSIONS  1. Left ventricular ejection fraction, by estimation, is 70 to 75%. The left ventricle has hyperdynamic function. The left ventricle has no regional wall motion abnormalities. There is mild concentric left ventricular hypertrophy. Left ventricular diastolic parameters are consistent with Grade I diastolic dysfunction (impaired relaxation). The average left ventricular global longitudinal strain is -18.8 %. The global longitudinal strain is normal.  2. Right ventricular systolic function is  normal. The right ventricular size is normal. There is normal pulmonary artery systolic pressure.  3. Left atrial size was mildly dilated.  4. The mitral valve is abnormal. No evidence of mitral valve regurgitation. No evidence of mitral stenosis. Moderate mitral annular calcification.  5. The aortic valve has been repaired/replaced. Aortic valve regurgitation is not visualized. No aortic stenosis is present. Echo findings are consistent with normal structure and function of the aortic valve prosthesis. Aortic valve mean gradient measures 15.5 mmHg. Aortic valve Vmax measures 2.73 m/s.  6. The inferior vena cava is normal in size with greater than 50% respiratory variability, suggesting right atrial pressure of 3 mmHg. FINDINGS  Left Ventricle: Left ventricular ejection fraction, by estimation, is 70 to 75%. The left ventricle has hyperdynamic function. The left ventricle has no regional wall motion abnormalities. The average left ventricular global longitudinal strain is -18.8  %. Strain was performed and the global longitudinal strain is normal. The left ventricular internal cavity size was normal in size. There is mild concentric left ventricular hypertrophy. Left ventricular diastolic parameters are consistent with Grade I diastolic dysfunction (impaired relaxation). Right Ventricle: The right ventricular size is normal. No increase in right ventricular wall thickness. Right ventricular systolic function is normal. There is normal pulmonary artery systolic pressure. The tricuspid regurgitant velocity is 2.18 m/s, and  with an assumed right atrial pressure of 3 mmHg, the estimated right ventricular systolic pressure is 22.0 mmHg. Left Atrium: Left atrial size was mildly dilated. Right Atrium: Right atrial size was normal in size. Pericardium: There is no evidence of pericardial effusion. Mitral Valve: The mitral valve is abnormal. There is mild calcification of the mitral valve leaflet(s). Moderate mitral annular  calcification. No evidence of mitral valve regurgitation. No evidence of mitral valve stenosis. Tricuspid Valve: The tricuspid valve is normal in structure. Tricuspid valve regurgitation is trivial. No evidence of tricuspid stenosis. Aortic Valve: The aortic valve has been repaired/replaced. Aortic valve regurgitation is not visualized. No aortic stenosis is present. Aortic valve mean gradient measures 15.5 mmHg. Aortic valve peak gradient measures 29.9 mmHg. Echo findings are consistent with normal structure and function of the aortic valve prosthesis. Pulmonic Valve: The pulmonic valve was normal in structure. Pulmonic valve regurgitation is not visualized. No evidence of pulmonic stenosis. Aorta: The aortic root is normal in size and structure. Venous: The inferior vena cava is normal in size with greater than 50% respiratory variability, suggesting right atrial pressure of 3 mmHg. IAS/Shunts: No atrial level shunt detected by color flow Doppler.  LEFT VENTRICLE PLAX 2D LVIDd:         5.20 cm Diastology LVIDs:         2.70 cm LV e' medial:    8.92 cm/s LV PW:  1.10 cm LV E/e' medial:  10.2 LV IVS:        1.30 cm LV e' lateral:   13.10 cm/s                        LV E/e' lateral: 6.9                         2D Longitudinal Strain                        2D Strain GLS Avg:     -18.8 % RIGHT VENTRICLE             IVC RV S prime:     18.00 cm/s  IVC diam: 1.30 cm TAPSE (M-mode): 2.3 cm LEFT ATRIUM             Index        RIGHT ATRIUM           Index LA diam:        4.50 cm 2.52 cm/m   RA Area:     11.90 cm LA Vol (A2C):   37.1 ml 20.79 ml/m  RA Volume:   23.50 ml  13.17 ml/m LA Vol (A4C):   46.8 ml 26.22 ml/m LA Biplane Vol: 44.1 ml 24.71 ml/m  AORTIC VALVE AV Vmax:           273.33 cm/s AV Vmean:          179.000 cm/s AV VTI:            0.584 m AV Peak Grad:      29.9 mmHg AV Mean Grad:      15.5 mmHg LVOT Vmax:         157.00 cm/s LVOT Vmean:        108.000 cm/s LVOT VTI:          0.340 m LVOT/AV VTI  ratio: 0.58 MITRAL VALVE                TRICUSPID VALVE MV Area (PHT): 2.45 cm     TR Peak grad:   19.0 mmHg MV Decel Time: 310 msec     TR Vmax:        218.00 cm/s MV E velocity: 90.70 cm/s MV A velocity: 104.00 cm/s  SHUNTS MV E/A ratio:  0.87         Systemic VTI: 0.34 m Toribio Fuel MD Electronically signed by Toribio Fuel MD Signature Date/Time: 05/04/2024/10:49:56 AM    Final    DG Abd 1 View Result Date: 05/03/2024 CLINICAL DATA:  OG tube placement EXAM: ABDOMEN - 1 VIEW COMPARISON:  CT 05/24/2018 FINDINGS: Enteric tube with side port in the stomach and tip at the pylorus duodenal junction or in the first portion of the duodenum. IMPRESSION: Enteric tube with side port in the stomach and tip at the pylorus duodenal junction or in the first portion of the duodenum. Electronically Signed   By: Norman Gatlin M.D.   On: 05/03/2024 19:51   Structural Heart Procedure Result Date: 05/03/2024 See surgical note for result.  ECHOCARDIOGRAM LIMITED Result Date: 05/03/2024    ECHOCARDIOGRAM LIMITED REPORT   Patient Name:   Whitney Chavez Date of Exam: 05/03/2024 Medical Rec #:  [id number]     Height:       60.0 in Accession #:    [id number]    Weight:  180.0 lb Date of Birth:  Jan 28, 1956    BSA:          1.785 m Patient Age:    81 years      BP:           138/67 mmHg Patient Gender: F             HR:           67 bpm. Exam Location:  Inpatient Procedure: Limited Echo, Limited Color Doppler and Color Doppler (Both Spectral            and Color Flow Doppler were utilized during procedure). Indications:     aortic stenosis. TAVR.  History:         Patient has prior history of Echocardiogram examinations, most                  recent 02/12/2024. COPD and end stage renal disease.; Risk                  Factors:Sleep Apnea.                  Aortic Valve: 29 mm stented (TAVR) valve is present in the                  aortic position. Procedure Date: 05/03/24.  Sonographer:     Tinnie Barefoot RDCS Referring  Phys:  [id number] ARUN K THUKKANI Diagnosing Phys: Darryle Decent MD IMPRESSIONS  1. Echo guided TAVR. 29 mm Evolut Pro. MG 11 mmHG. Noted to have increased SV (115 cc) which likely explains the gradient present. The aortic valve has been repaired/replaced. Aortic valve regurgitation is not visualized. There is a 29 mm stented (TAVR)  valve present in the aortic position. Procedure Date: 05/03/24. Aortic valve area, by VTI measures 2.28 cm. Aortic valve mean gradient measures 11.0 mmHg. Aortic valve Vmax measures 2.27 m/s.  2. Left ventricular ejection fraction, by estimation, is 65 to 70%. The left ventricle has normal function. The left ventricle has no regional wall motion abnormalities.  3. Right ventricular systolic function is normal. The right ventricular size is normal.  4. Left atrial size was severely dilated.  5. The mitral valve is degenerative. Moderate mitral annular calcification. FINDINGS  Left Ventricle: Left ventricular ejection fraction, by estimation, is 65 to 70%. The left ventricle has normal function. The left ventricle has no regional wall motion abnormalities. The left ventricular internal cavity size was normal in size. There is  no left ventricular hypertrophy. Right Ventricle: The right ventricular size is normal. No increase in right ventricular wall thickness. Right ventricular systolic function is normal. Left Atrium: Left atrial size was severely dilated. Right Atrium: Right atrial size was normal in size. Pericardium: There is no evidence of pericardial effusion. Mitral Valve: The mitral valve is degenerative in appearance. Moderate mitral annular calcification. Tricuspid Valve: The tricuspid valve is grossly normal. Tricuspid valve regurgitation is mild . No evidence of tricuspid stenosis. Aortic Valve: Echo guided TAVR. 29 mm Evolut Pro. MG 11 mmHG. Noted to have increased SV (115 cc) which likely explains the gradient present. The aortic valve has been repaired/replaced. Aortic valve  regurgitation is not visualized. Aortic valve mean gradient measures 11.0 mmHg. Aortic valve peak gradient measures 20.6 mmHg. Aortic valve area, by VTI measures 2.28 cm. There is a 29 mm stented (TAVR) valve present in the aortic position. Procedure Date: 05/03/24. Pulmonic Valve: The pulmonic valve was grossly normal. Pulmonic valve regurgitation is  not visualized. No evidence of pulmonic stenosis. Aorta: The aortic root and ascending aorta are structurally normal, with no evidence of dilitation. IAS/Shunts: The atrial septum is grossly normal. Additional Comments: Spectral Doppler performed. Color Doppler performed.  LEFT VENTRICLE PLAX 2D LVIDd:         5.20 cm LVIDs:         3.30 cm LV PW:         0.90 cm LV IVS:        1.00 cm LVOT diam:     2.15 cm LV SV:         115 LV SV Index:   64 LVOT Area:     3.63 cm  LEFT ATRIUM         Index LA diam:    4.00 cm 2.24 cm/m  AORTIC VALVE AV Area (Vmax):    2.18 cm AV Area (Vmean):   2.21 cm AV Area (VTI):     2.28 cm AV Vmax:           227.00 cm/s AV Vmean:          150.000 cm/s AV VTI:            0.503 m AV Peak Grad:      20.6 mmHg AV Mean Grad:      11.0 mmHg LVOT Vmax:         136.50 cm/s LVOT Vmean:        91.400 cm/s LVOT VTI:          0.316 m LVOT/AV VTI ratio: 0.63  AORTA Ao Root diam: 2.60 cm Ao Asc diam:  2.70 cm MITRAL VALVE                TRICUSPID VALVE MV Area (PHT): 2.99 cm     TR Peak grad:   36.5 mmHg MV Decel Time: 254 msec     TR Vmax:        302.00 cm/s MV E velocity: 115.00 cm/s MV A velocity: 100.00 cm/s  SHUNTS MV E/A ratio:  1.15         Systemic VTI:  0.32 m                             Systemic Diam: 2.15 cm Darryle Decent MD Electronically signed by Darryle Decent MD Signature Date/Time: 05/03/2024/3:52:41 PM    Final    DG Chest 2 View Result Date: 04/30/2024 CLINICAL DATA:  Preop for cardiac procedure. Aortic stenosis. Upcoming TAVR. EXAM: CHEST - 2 VIEW COMPARISON:  Chest radiograph 11/12/2022.  CT 04/05/2022 FINDINGS: The  cardiomediastinal contours are stable. Aortic atherosclerosis. Chronic bronchial thickening. Chronic hyperinflation. Pulmonary vasculature is normal. No consolidation, pleural effusion, or pneumothorax. No acute osseous abnormalities are seen. Chronic bilateral shoulder arthropathy. IMPRESSION: Chronic hyperinflation and bronchial thickening. Electronically Signed   By: Andrea Gasman M.D.   On: 04/30/2024 17:32     Discharge Exam: Vitals:   05/26/24 1200 05/26/24 1202  BP: 103/87   Pulse: (!) 109 (!) 109  Resp: 19 17  Temp:    SpO2: 97% 98%   Vitals:   05/26/24 1030 05/26/24 1045 05/26/24 1200 05/26/24 1202  BP: 124/80  103/87   Pulse:   (!) 109 (!) 109  Resp: 10 (!) 21 19 17   Temp:      TempSrc:      SpO2:   97% 98%  Weight:  General: Pt is alert, awake, not in acute distress Cardiovascular: Irregularly irregular RR, S1/S2 +, no rubs, no gallops Respiratory: CTA bilaterally, no wheezing, no rhonchi Abdominal: Soft, NT, ND, bowel sounds + Extremities: no edema, no cyanosis    The results of significant diagnostics from this hospitalization (including imaging, microbiology, ancillary and laboratory) are listed below for reference.     Microbiology: No results found for this or any previous visit (from the past 240 hours).   Labs: BNP (last 3 results) No results for input(s): BNP in the last 8760 hours. Basic Metabolic Panel: Recent Labs  Lab 05/25/24 1205 05/25/24 1525  NA 135  --   K 3.7  --   CL 98  --   CO2 23  --   GLUCOSE 84  --   BUN 18  --   CREATININE 3.02* 3.31*  CALCIUM  9.3  --   MG 1.9  --    Liver Function Tests: Recent Labs  Lab 05/25/24 1205  AST 22  ALT 7  ALKPHOS 63  BILITOT 1.0  PROT 6.7  ALBUMIN 3.4*   No results for input(s): LIPASE, AMYLASE in the last 168 hours. No results for input(s): AMMONIA in the last 168 hours. CBC: Recent Labs  Lab 05/25/24 1205 05/25/24 1525 05/26/24 0423  WBC 5.3 6.0 4.9   NEUTROABS 3.3 3.6  --   HGB 9.7* 10.1* 9.8*  HCT 31.7* 31.6* 32.6*  MCV 93.5 93.5 96.7  PLT 81* 88* 88*   Cardiac Enzymes: No results for input(s): CKTOTAL, CKMB, CKMBINDEX, TROPONINI in the last 168 hours. BNP: Invalid input(s): POCBNP CBG: No results for input(s): GLUCAP in the last 168 hours. D-Dimer No results for input(s): DDIMER in the last 72 hours. Hgb A1c No results for input(s): HGBA1C in the last 72 hours. Lipid Profile No results for input(s): CHOL, HDL, LDLCALC, TRIG, CHOLHDL, LDLDIRECT in the last 72 hours. Thyroid function studies Recent Labs    05/25/24 1205  TSH 7.826*   Anemia work up Recent Labs    05/25/24 1525  VITAMINB12 633  FOLATE >20.0   Urinalysis No results found for: COLORURINE, APPEARANCEUR, LABSPEC, PHURINE, GLUCOSEU, HGBUR, BILIRUBINUR, KETONESUR, PROTEINUR, UROBILINOGEN, NITRITE, LEUKOCYTESUR Sepsis Labs Recent Labs  Lab 05/25/24 1205 05/25/24 1525 05/26/24 0423  WBC 5.3 6.0 4.9   Microbiology No results found for this or any previous visit (from the past 240 hours).  FURTHER DISCHARGE INSTRUCTIONS:   Get Medicines reviewed and adjusted: Please take all your medications with you for your next visit with your Primary MD   Laboratory/radiological data: Please request your Primary MD to go over all hospital tests and procedure/radiological results at the follow up, please ask your Primary MD to get all Hospital records sent to his/her office.   In some cases, they will be blood work, cultures and biopsy results pending at the time of your discharge. Please request that your primary care M.D. goes through all the records of your hospital data and follows up on these results.   Also Note the following: If you experience worsening of your admission symptoms, develop shortness of breath, life threatening emergency, suicidal or homicidal thoughts you must seek medical attention  immediately by calling 911 or calling your MD immediately  if symptoms less severe.   You must read complete instructions/literature along with all the possible adverse reactions/side effects for all the Medicines you take and that have been prescribed to you. Take any new Medicines after you have completely understood and accpet  all the possible adverse reactions/side effects.    patient was instructed, not to drive, operate heavy machinery, perform activities at heights, swimming or participation in water activities or provide baby-sitting services while on Pain, Sleep and Anxiety Medications; until their outpatient Physician has advised to do so again. Also recommended to not to take more than prescribed Pain, Sleep and Anxiety Medications.  It is not advisable to combine anxiety, sleep and pain medications without talking with your primary care provider.     Wear Seat belts while driving.   Please note: You were cared for by a hospitalist during your hospital stay. Once you are discharged, your primary care physician will handle any further medical issues. Please note that NO REFILLS for any discharge medications will be authorized once you are discharged, as it is imperative that you return to your primary care physician (or establish a relationship with a primary care physician if you do not have one) for your post hospital discharge needs so that they can reassess your need for medications and monitor your lab values  Time coordinating discharge: Over 30 minutes  SIGNED:   Fredia Skeeter, MD  [facility] 05/26/2024, 12:35 PM *Please note that this is a verbal dictation therefore any spelling or grammatical errors are due to the Dragon Medical One system interpretation. If 7PM-7AM, please contact night-coverage [email or web address]

## 2024-05-26 NOTE — Care Management Obs Status (Signed)
 MEDICARE OBSERVATION STATUS NOTIFICATION   Patient Details  Name: Whitney Chavez MRN: [id number] Date of Birth: 08/01/1956   Medicare Observation Status Notification Given:  Yes    Scotti Motter, RN 05/26/2024, 1:02 PM

## 2024-05-26 NOTE — Care Management CC44 (Signed)
 Condition Code 44 Documentation Completed  Patient Details  Name: Whitney Chavez MRN: [id number] Date of Birth: 02-Aug-1956   Condition Code 44 given:  Yes Patient signature on Condition Code 44 notice:  Yes Documentation of 2 MD's agreement:  Yes Code 44 added to claim:  Yes    Glade Strausser, RN 05/26/2024, 1:02 PM

## 2024-05-26 NOTE — Consult Note (Signed)
 Cardiology Consultation   Patient ID: Whitney Chavez MRN: [id number]; DOB: [birth year]  Admit date: 05/25/2024 Date of Consult: 05/26/2024  PCP:  Cleotilde Planas, MD   [facility] Providers Cardiologist:  Lurena MARLA Red, MD  Structural Heart:  Arun K Thukkani, MD      Patient Profile: Whitney Chavez is a 68 y.o. female with a hx of atrial fib and aortic stenosis who is being seen 05/26/2024 for the evaluation of atrial flutter  at the request of Dr. Okey.  History of Present Illness: Whitney Chavez has a 5 year h/o atrial fib. She has had over 10 years of ESRD on HD with multiple graft revisions. She has been on coumadin  in the past. She has factor 5 Leiden. She was in her usual state of health when in HD she was noted to be tachycardic and found to have atrial flutter with 2:1 AV conduction despite 200 mg daily of amiodarone . She was brought to the ED for eval. She is asymptomatic and tolerated HD well. She does not have palpitations. No syncope.    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 Red and lightfoot   Severe aortic stenosis    Vision abnormalities     Past Surgical History:  Procedure Laterality Date   APPLICATION OF WOUND VAC     AV FISTULA PLACEMENT Left    BIOPSY  11/05/2022   Procedure: BIOPSY;  Surgeon: Burnette Fallow, MD;  Location: [facility];  Service: Gastroenterology;;   COLONOSCOPY WITH PROPOFOL  Bilateral 11/05/2022   Procedure: COLONOSCOPY WITH PROPOFOL ;   Surgeon: Burnette Fallow, MD;  Location: [facility];  Service: Gastroenterology;  Laterality: Bilateral;   CORONARY ANGIOGRAPHY N/A 03/29/2024   Procedure: CORONARY ANGIOGRAPHY;  Surgeon: Red Lurena MARLA, MD;  Location: [facility];  Service: Cardiovascular;  Laterality: N/A;   ESOPHAGOGASTRODUODENOSCOPY (EGD) WITH PROPOFOL  Bilateral 03/19/2022   Procedure: ESOPHAGOGASTRODUODENOSCOPY (EGD) WITH PROPOFOL ;  Surgeon: Burnette Fallow, MD;  Location: [facility];  Service: Gastroenterology;  Laterality: Bilateral;   INTRAOPERATIVE TRANSTHORACIC ECHOCARDIOGRAM N/A 05/03/2024   Procedure: ECHOCARDIOGRAM, TRANSTHORACIC;  Surgeon: Red Lurena MARLA, MD;  Location: [facility];  Service: Cardiovascular;  Laterality: N/A;   LOWER EXTREMITY ANGIOGRAPHY  03/29/2024   Procedure: Lower Extremity Angiography;  Surgeon: Red Lurena MARLA, MD;  Location: [facility];  Service: Cardiovascular;;   lumbar decompression fusion     TONSILLECTOMY     UPPER EXTREMITY VENOGRAPHY  03/29/2024   Procedure: UPPER EXTREMITY VENOGRAPHY;  Surgeon: Red Lurena MARLA, MD;  Location: [facility];  Service: Cardiovascular;;     Home Medications:  Prior to Admission medications   Medication Sig Start Date End Date Taking? Authorizing Provider  acetaminophen  (TYLENOL ) 500 MG tablet Take 500 mg by mouth every 6 (six) hours as needed for moderate pain.   Yes [provider]  amiodarone  (PACERONE ) 200 MG tablet  Take 200 mg by mouth in the morning. 12/28/18  Yes [provider]  amoxicillin  (AMOXIL ) 500 MG capsule Take 4 capsules by mouth as directed. 1 hour prior to dental work including cleanings 05/11/24  Yes [provider]  apixaban  (ELIQUIS ) 5 MG TABS tablet Take 1 tablet (5 mg total) by mouth 2 (two) times daily. 10/13/22 04/27/27 Yes Ennever, Maude SAUNDERS, MD  atorvastatin  (LIPITOR) 20 MG tablet Take 20 mg by mouth every evening. 01/03/19  Yes [provider]  BREYNA  160-4.5  MCG/ACT inhaler Inhale 2 puffs into the lungs 2 (two) times daily. 09/04/23  Yes Kassie Acquanetta Bradley, MD  cetirizine (ZYRTEC) 10 MG tablet Take 10 mg by mouth daily in the afternoon.   Yes [provider]  epoetin alfa (EPOGEN) 2000 UNIT/ML injection 2,000 Units 3 (three) times a week.   Yes [provider]  Etelcalcetide HCl (PARSABIV IV) Inject 1 Dose into the vein 3 (three) times a week. Receives at Dialysis   Yes [provider]  fluocinonide ointment (LIDEX) 0.05 % Apply 1 Application topically 2 (two) times daily as needed (psoriasis). 03/10/24  Yes [provider]  gabapentin  (NEURONTIN ) 100 MG capsule Take 1 capsule (100 mg total) by mouth 4 (four) times daily. Patient taking differently: Take 100-200 mg by mouth 2 (two) times daily. In the afternoon and in the evening 08/04/23  Yes Lomax, Amy, NP  lanthanum  (FOSRENOL ) 500 MG chewable tablet Chew 500-1,000 mg by mouth See admin instructions. Take 1-2 tablets [phone] mg) by mouth with each meal & take 1 tablet (500 mg) with snacks.   Yes [provider]  multivitamin (RENA-VIT) TABS tablet Take 1 tablet by mouth daily in the afternoon.   Yes [provider]  naphazoline-pheniramine (ALLERGY EYE) 0.025-0.3 % ophthalmic solution Place 1-2 drops into both eyes 4 (four) times daily as needed for eye irritation or allergies.   Yes [provider]  neomycin-bacitracin-polymyxin (NEOSPORIN) OINT Apply 1 Application topically as needed for wound care.   Yes [provider]  omeprazole (PRILOSEC) 20 MG capsule Take 20 mg by mouth daily as needed (indigestion/heartburn.).   Yes [provider]  ondansetron  (ZOFRAN -ODT) 4 MG disintegrating tablet Take 4 mg by mouth every Monday, Wednesday, and Friday.   Yes [provider]  oxyCODONE  (ROXICODONE ) 5 MG immediate release tablet Take one po qd for more severe pain Patient taking differently: Take 5 mg by mouth 2 (two)  times daily as needed (restless leg). Take one po qd for more severe pain 05/23/24  Yes Sater, Charlie LABOR, MD  PRESCRIPTION MEDICATION Inhale into the lungs at bedtime. CPAP with nasal pillow   Yes [provider]  rOPINIRole  (REQUIP ) 0.5 MG tablet TAKE 1-2 TABLETS BY MOUTH IN THE MORNING. Patient taking differently: Take 0.5 mg by mouth daily in the afternoon. 08/04/23  Yes Lomax, Amy, NP  rOPINIRole  (REQUIP ) 2 MG tablet Take 1 tablet (2 mg total) by mouth at bedtime. 08/04/23  Yes Lomax, Amy, NP  SODIUM THIOSULFATE IV Inject 1 Dose into the vein 2 (two) times a week. Receives at Dialysis   Yes [provider]  sulfaSALAzine  (AZULFIDINE ) 500 MG tablet Take 1,000 mg by mouth 2 (two) times daily. 02/15/15  Yes [provider]  traMADol  (ULTRAM ) 50 MG tablet TAKE 1 TABLET (50 MG TOTAL) BY MOUTH 2 (TWO) TIMES DAILY AS NEEDED (PAIN). 12/17/23  Yes Sater, Charlie LABOR, MD  triamcinolone cream (KENALOG) 0.1 % Apply 1 Application topically daily as  needed (itching). 08/31/18  Yes [provider]  umeclidinium bromide  (INCRUSE ELLIPTA ) 62.5 MCG/ACT AEPB Inhale 1 puff into the lungs daily. 09/03/23  Yes Kassie Acquanetta Bradley, MD  Elastic Bandages & Supports (MEDICAL COMPRESSION STOCKINGS) MISC by Does not apply route. Leg wraps.    [provider]    Scheduled Meds:  apixaban   5 mg Oral BID   atorvastatin   20 mg Oral QPM   fluticasone  furoate-vilanterol  1 puff Inhalation Daily   lanthanum   1,000 mg Oral TID WC   And   lanthanum   500 mg Oral With snacks   loratadine   10 mg Oral Daily   multivitamin  1 tablet Oral Q1500   pantoprazole   40 mg Oral Daily   rOPINIRole   0.5 mg Oral Q1500   sulfaSALAzine   1,000 mg Oral BID   umeclidinium bromide   1 puff Inhalation Daily   Continuous Infusions:  amiodarone  30 mg/hr (05/26/24 0948)   PRN Meds: ipratropium-albuterol , oxyCODONE   Allergies:    Allergies  Allergen Reactions   Estrogens Other (See Comments)    Blood  clots   Other Other (See Comments)    Unable to take birth control pills due to clotting disorder/fim   Prednisone Other (See Comments)    Facial edema - has had this 2020 and had no difficulty can take in small doses   Progesterone Other (See Comments)    Blood clots   Propofol  Other (See Comments)    Became disinhibited and exacerbated restless leg syndrome during EGD/colonoscopy. Patient has requested an alternate form of sedation during future procedures.  Legs started thrashing and skin tears.   Lisinopril Cough   Clotrimazole Rash   Tape Rash and Other (See Comments)    Please use paper tape    Social History:   Social History   Socioeconomic History   Marital status: Single    Spouse name: Not on file   Number of children: Not on file   Years of education: Not on file   Highest education level: Not on file  Occupational History   Not on file  Tobacco Use   Smoking status: Former    Current packs/day: 0.00    Average packs/day: 2.0 packs/day for 30.0 years (60.0 ttl pk-yrs)    Types: Cigarettes    Start date: 11/24/1968    Quit date: 11/25/1998    Years since quitting: 25.5   Smokeless tobacco: Never  Vaping Use   Vaping status: Never Used  Substance and Sexual Activity   Alcohol use: No    Alcohol/week: 0.0 standard drinks of alcohol   Drug use: No   Sexual activity: Not on file  Other Topics Concern   Not on file  Social History Narrative   Not on file   Social Drivers of Health   Financial Resource Strain: Not on file  Food Insecurity: Patient Declined (05/04/2024)   Hunger Vital Sign    Worried About Running Out of Food in the Last Year: Patient declined    Ran Out of Food in the Last Year: Patient declined  Transportation Needs: Patient Declined (05/04/2024)   PRAPARE - Administrator, Civil Service (Medical): Patient declined    Lack of Transportation (Non-Medical): Patient declined  Physical Activity: Not on file  Stress: Not on file   Social Connections: Patient Declined (05/04/2024)   Social Connection and Isolation Panel    Frequency of Communication with Friends and Family: Patient declined    Frequency of Social Gatherings  with Friends and Family: Patient declined    Attends Religious Services: Patient declined    Active Member of Clubs or Organizations: Patient declined    Attends Banker Meetings: Patient declined    Marital Status: Patient declined  Intimate Partner Violence: Patient Declined (05/04/2024)   Humiliation, Afraid, Rape, and Kick questionnaire    Fear of Current or Ex-Partner: Patient declined    Emotionally Abused: Patient declined    Physically Abused: Patient declined    Sexually Abused: Patient declined    Family History:    Family History  Problem Relation Age of Onset   Diabetes Mother    Congestive Heart Failure Mother    COPD Mother    Stroke Mother    Neuropathy Mother    Heart disease Father    Stroke Father    Cancer Brother    Diabetes Brother    Diabetes Brother    Alcohol abuse Brother      ROS:  Please see the history of present illness.   All other ROS reviewed and negative.     Physical Exam/Data: Vitals:   05/26/24 0945 05/26/24 1000 05/26/24 1030 05/26/24 1045  BP: 130/74  124/80   Pulse:      Resp:  17 10 (!) 21  Temp:      TempSrc:      SpO2:      Weight:        Intake/Output Summary (Last 24 hours) at 05/26/2024 1115 Last data filed at 05/26/2024 0957 Gross per 24 hour  Intake 972.18 ml  Output --  Net 972.18 ml      05/25/2024   11:39 AM 05/11/2024   12:50 PM 05/03/2024   10:58 AM  Last 3 Weights  Weight (lbs) 177 lb 177 lb 180 lb  Weight (kg) 80.287 kg 80.287 kg 81.647 kg     Body mass index is 34.57 kg/m.  General:  Well nourished, well developed, in no acute distress but chronically ill appearing. HEENT: normal Neck: no JVD Vascular: No carotid bruits; Distal pulses 2+ bilaterally Cardiac:  normal S1, S2; RRR; no murmur   Lungs:  clear to auscultation bilaterally, no wheezing, rhonchi or rales  Abd: soft, nontender, no hepatomegaly  Ext: no edema Musculoskeletal:  No deformities, BUE and BLE strength normal and equal Skin: warm and dry  Neuro:  CNs 2-12 intact, no focal abnormalities noted Psych:  Normal affect   EKG:  The EKG was personally reviewed and demonstrates:  atrial flutter with 2:1 AV conduction Telemetry:  Telemetry was personally reviewed and demonstrates:  atrial flutter with mostly 2:1 AV conduction  Relevant CV Studies: none  Laboratory Data: High Sensitivity Troponin:  No results for input(s): TROPONINIHS in the last 720 hours.   Chemistry Recent Labs  Lab 05/25/24 1205 05/25/24 1525  NA 135  --   K 3.7  --   CL 98  --   CO2 23  --   GLUCOSE 84  --   BUN 18  --   CREATININE 3.02* 3.31*  CALCIUM  9.3  --   MG 1.9  --   GFRNONAA 16* 15*  ANIONGAP 14  --     Recent Labs  Lab 05/25/24 1205  PROT 6.7  ALBUMIN 3.4*  AST 22  ALT 7  ALKPHOS 63  BILITOT 1.0   Lipids No results for input(s): CHOL, TRIG, HDL, LABVLDL, LDLCALC, CHOLHDL in the last 168 hours.  Hematology Recent Labs  Lab 05/25/24 1205 05/25/24 1525  05/26/24 0423  WBC 5.3 6.0 4.9  RBC 3.39* 3.38* 3.37*  HGB 9.7* 10.1* 9.8*  HCT 31.7* 31.6* 32.6*  MCV 93.5 93.5 96.7  MCH 28.6 29.9 29.1  MCHC 30.6 32.0 30.1  RDW 17.0* 17.2* 17.2*  PLT 81* 88* 88*   Thyroid  Recent Labs  Lab 05/25/24 1205 05/26/24 0423  TSH 7.826*  --   FREET4  --  1.03    BNPNo results for input(s): BNP, PROBNP in the last 168 hours.  DDimer No results for input(s): DDIMER in the last 168 hours.  Radiology/Studies:  DG Chest 2 View Result Date: 05/25/2024 CLINICAL DATA:  History of atrial flutter on long-term use of amiodarone . EXAM: CHEST - 2 VIEW COMPARISON:  April 29, 2024 FINDINGS: The heart size and mediastinal contours are within normal limits. An artificial aortic valve is seen. No acute infiltrate,  pleural effusion or pneumothorax is identified. Degenerative changes are present involving both shoulders. No acute osseous abnormality is identified. IMPRESSION: 1. Evidence of prior aortic valve replacement. 2. No acute cardiopulmonary disease. Electronically Signed   By: Suzen Dials M.D.   On: 05/25/2024 17:08     Assessment and Plan: Atrial flutter - appears to be typical but is asymptomatic.  Atrial fib - appears to be controlled on amio but I suspect that she is having some afib break throughs.  AS s/p TAVR 3 weeks ago Coags - she has not had any bleeding.  Rec: I have discussed the treatment options with the patient in detail. Afib and flutter ablation is best option for her. Aflutter ablation with continued amiodarone  is another option but in light of 67 years and ideally getting off amio, I think fib/flutter combo ablation makes the most sense. As she is asymptomatic, It is reasonable to double up on her amio to 200 bid and have her see Dr. Kennyth about catheter ablation of both fib and flutter.  She can be discharged home. I will arrange outpatient followup.    For questions or updates, please contact [facility] Please consult [email or web address] for contact info under    Signed, Danelle Birmingham, MD  05/26/2024 11:15 AM

## 2024-05-27 ENCOUNTER — Encounter: Payer: Self-pay | Admitting: Internal Medicine

## 2024-06-01 ENCOUNTER — Telehealth: Payer: Self-pay

## 2024-06-01 NOTE — Telephone Encounter (Signed)
   Pre-operative Risk Assessment    Patient Name: Whitney Chavez  DOB: 1956-04-02 MRN: [id number]   Date of last office visit: 05/11/2024, Lamarr Hummer, PA-C Date of next office visit: 06/16/2024, Lamarr Hummer, PA-C   Request for Surgical Clearance    Procedure:  Left eye cataract extraction w/Intraocular Lens Implantation  Date of Surgery:  Clearance 06/08/24                                Surgeon: Dr. Evalene Raw Surgeon's Group or Practice Name: [facility] Phone number: [phone]  Fax number: [phone]   Type of Clearance Requested:   - Medical  - Pharmacy:  Hold Apixaban  (Eliquis )     Type of Anesthesia:  Not Indicated   Additional requests/questions:  Pt. Was recently hospitalized for Afib 05/25/2024   Signed, Asberry KANDICE Dunning   06/01/2024, 11:05 AM

## 2024-06-01 NOTE — Telephone Encounter (Signed)
 Called referring office for clarification as to which eye the patient will have surgery on 06/08/24. She will have it on her left eye. I will proceed and place her pre-op clearance into her chart.

## 2024-06-01 NOTE — Telephone Encounter (Signed)
   Patient Name: Whitney Chavez  DOB: 1955/12/20 MRN: [id number]  Primary Cardiologist: Arun K Thukkani, MD  Chart reviewed as part of pre-operative protocol coverage. Cataract extractions are recognized in guidelines as low risk surgeries that do not typically require specific preoperative testing or holding of blood thinner therapy. Therefore, given past medical history and time since last visit, based on ACC/AHA guidelines, YARITSA SAVARINO would be at acceptable risk for the planned procedure without further cardiovascular testing.   I will route this recommendation to the requesting party via Epic fax function and remove from pre-op pool.  Please call with questions.  Shronda Boeh E Alliana Mcauliff, NP 06/01/2024, 11:21 AM

## 2024-06-03 ENCOUNTER — Encounter (HOSPITAL_BASED_OUTPATIENT_CLINIC_OR_DEPARTMENT_OTHER): Payer: Self-pay

## 2024-06-06 NOTE — Progress Notes (Unsigned)
 Electrophysiology Office Note:   Date:  06/07/2024  ID:  Whitney Chavez, DOB Mar 16, 1956, MRN [id number]  Primary Cardiologist: Lurena MARLA Red, MD Electrophysiologist: Fonda Kitty, MD      History of Present Illness:   Whitney Chavez is a 68 y.o. female with h/o atrial fibrillation and atrial flutter, ESRD on HD, aortic stenosis s/p TAVR on 05/03/24, factor 5 Leiden who is being seen today for evaluation for catheter ablation at the request of Dr. Danelle Birmingham.   Discussed the use of AI scribe software for clinical note transcription with the patient, who gave verbal consent to proceed.  History of Present Illness Whitney Chavez is a 68 year old female with atrial fibrillation and atrial flutter who presents for evaluation of her heart condition. She was referred by Dr. Birmingham for evaluation of her heart condition.  She has a history of atrial fibrillation and atrial flutter, with episodes documented on EKGs, including one from [facility]. She is currently on amiodarone , which was doubled during a recent hospital stay two weeks ago. Since the increase in dosage, she has not experienced any issues with her heart rhythm. However, she describes a recent episode where she was in the emergency room for 28 hours due to an elevated heart rate that did not decrease despite treatment. She was unable to receive her restless leg medication during this time, contributing to her distress. She was eventually discharged and noted that her heart rate decreased from 116 to 74 overnight at home.  She is a dialysis patient and reports that her heart rate can become elevated during dialysis sessions, leading to emergency room visits. She has a history of cataract surgery being postponed due to her heart condition. She also reports a traumatic experience during a previous heart surgery where she was awake but unable to communicate while intubated, making her apprehensive about undergoing further procedures.  Her  current medications include amiodarone , ropinirole , and gabapentin . She occasionally takes oxycodone  during dialysis to manage her restless leg symptoms, as her current medications are not fully effective due to dialysis constraints. She reports that since the increase in amiodarone  dosage, she has not experienced any significant issues with her heart rhythm.    Review of systems complete and found to be negative unless listed in HPI.   EP Information / Studies Reviewed:    EKG is not ordered today. EKG from 05/25/24 reviewed which showed typical AFL      ECG 12/30/2018:    Echo 05/04/24:   1. Left ventricular ejection fraction, by estimation, is 70 to 75%. The  left ventricle has hyperdynamic function. The left ventricle has no  regional wall motion abnormalities. There is mild concentric left  ventricular hypertrophy. Left ventricular  diastolic parameters are consistent with Grade I diastolic dysfunction  (impaired relaxation). The average left ventricular global longitudinal  strain is -18.8 %. The global longitudinal strain is normal.   2. Right ventricular systolic function is normal. The right ventricular  size is normal. There is normal pulmonary artery systolic pressure.   3. Left atrial size was mildly dilated.   4. The mitral valve is abnormal. No evidence of mitral valve  regurgitation. No evidence of mitral stenosis. Moderate mitral annular  calcification.   5. The aortic valve has been repaired/replaced. Aortic valve  regurgitation is not visualized. No aortic stenosis is present. Echo  findings are consistent with normal structure and function of the aortic  valve prosthesis. Aortic valve mean gradient  measures  15.5 mmHg. Aortic valve Vmax measures 2.73 m/s.   6. The inferior vena cava is normal in size with greater than 50%  respiratory variability, suggesting right atrial pressure of 3 mmHg.   Coronary CTA 04/05/24:  IMPRESSION: 1. Tricuspid aortic valve with  severely reduced cusp excursion. Severely thickened and severely calcified aortic valve cusps. 2. Aortic valve calcium  score: 1113 3. Annulus area: 405 mm2, suitable for 23 mm Sapien 3 valve. Trivial LVOT calcifications. Membranous septal length 8 mm. 4. Sufficient coronary artery heights from annulus. 5. Optimum fluoroscopic angle for delivery: LAO 3 CAU 3  Risk Assessment/Calculations:    CHA2DS2-VASc Score = 6   This indicates a 9.7% annual risk of stroke. The patient's score is based upon: CHF History: 0 HTN History: 1 Diabetes History: 0 Stroke History: 2 (recurrent thromboembolic disease) Vascular Disease History: 1 Age Score: 1 Gender Score: 1             Physical Exam:   VS:  BP 97/61 (BP Location: Right Arm, Patient Position: Sitting, Cuff Size: Normal) Comment (BP Location): forearm  Pulse 65   Ht 5' (1.524 m)   Wt 180 lb 3.2 oz (81.7 kg)   SpO2 98%   BMI 35.19 kg/m    Wt Readings from Last 3 Encounters:  06/07/24 180 lb 3.2 oz (81.7 kg)  05/25/24 177 lb (80.3 kg)  05/11/24 177 lb (80.3 kg)     GEN: Well nourished, well developed in no acute distress NECK: No JVD CARDIAC: Normal rate, regular rhythm RESPIRATORY:  Clear to auscultation without rales, wheezing or rhonchi  ABDOMEN: Soft, non-distended EXTREMITIES:  No edema; No deformity   ASSESSMENT AND PLAN:    #. Typical atrial flutter: Episodes are interfering with her ability to complete dialysis sessions, resulting in multiple ED visits.  Episodes have continued despite amiodarone . #. Paroxysmal atrial fibrillation:  #. High risk medication use: Amiodarone .  TSH elevated but free T4 normal on 05/26/2024.  LFTs normal on 05/25/2024. -Discussed treatment options today for AF including antiarrhythmic drug therapy (long-term amiodarone  use) and ablation. Discussed risks, recovery and likelihood of success with each treatment strategy. Risk, benefits, and alternatives to EP study and ablation for afib were  discussed. These risks include but are not limited to stroke, bleeding, vascular damage, tamponade, perforation, damage to the esophagus, lungs, phrenic nerve and other structures, pulmonary vein stenosis, worsening renal function, coronary vasospasm and death.  Discussed potential need for repeat ablation procedures and antiarrhythmic drugs after an initial ablation. The patient understands these risk and wishes to to think about her options.  She is leaning towards performing catheter ablation sometime in December 2025.  If she chooses to proceed, then we will obtain CT PV protocol prior to the procedure. - Continue amiodarone  200 mg once a day as bridge to ablation.  #. Secondary hypercoagulable state due to AF/AFL - Continue Eliquis  5 mg twice daily.  Denies bleeding issues.  #. AS s/p TAVR: Well compensated on exam today. - Continue follow-up with primary cardiologist, Dr. Wendel.   Follow up with Dr. Kennyth 3 months after ablation.   Signed, Fonda Kennyth, MD

## 2024-06-07 ENCOUNTER — Ambulatory Visit: Attending: Cardiology | Admitting: Cardiology

## 2024-06-07 ENCOUNTER — Encounter: Payer: Self-pay | Admitting: Cardiology

## 2024-06-07 VITALS — BP 97/61 | HR 65 | Ht 60.0 in | Wt 180.2 lb

## 2024-06-07 DIAGNOSIS — I480 Paroxysmal atrial fibrillation: Secondary | ICD-10-CM | POA: Diagnosis not present

## 2024-06-07 DIAGNOSIS — I483 Typical atrial flutter: Secondary | ICD-10-CM | POA: Diagnosis not present

## 2024-06-07 DIAGNOSIS — D6869 Other thrombophilia: Secondary | ICD-10-CM | POA: Diagnosis not present

## 2024-06-07 DIAGNOSIS — Z952 Presence of prosthetic heart valve: Secondary | ICD-10-CM | POA: Diagnosis present

## 2024-06-07 NOTE — Patient Instructions (Addendum)
 Medication Instructions:  Your physician recommends that you continue on your current medications as directed. Please refer to the Current Medication list given to you today.  *If you need a refill on your cardiac medications before your next appointment, please call your pharmacy*  Testing/Procedures: Ablation Your physician has recommended that you have an ablation. Catheter ablation is a medical procedure used to treat some cardiac arrhythmias (irregular heartbeats). During catheter ablation, a long, thin, flexible tube is put into a blood vessel in your groin (upper thigh), or neck. This tube is called an ablation catheter. It is then guided to your heart through the blood vessel. Radio frequency waves destroy small areas of heart tissue where abnormal heartbeats may cause an arrhythmia to start.  Your procedure will be in January 2026 - we will call you to schedule.   What To Expect:  Labs: you will need to have lab work drawn within 30 days of your procedure. Please go to any [facility] location to have these drawn - no appointment is needed. Cardiac CT Scan: this will be done about 3-4 weeks prior to your procedure. You will be contacted to schedule this test. You will receive procedure instructions either through MyChart or in the mail 4-6 week prior to your procedure.  After your procedure we recommend no driving for 4 days, no lifting over 5 lbs for 7 days, and no work or strenuous activity for 7 days.  Please contact our office at [phone] if you have any questions.   Follow-Up: We will contact you to schedule your post-procedure appointments.

## 2024-06-10 ENCOUNTER — Ambulatory Visit (INDEPENDENT_AMBULATORY_CARE_PROVIDER_SITE_OTHER): Admitting: Ophthalmology

## 2024-06-10 ENCOUNTER — Encounter (INDEPENDENT_AMBULATORY_CARE_PROVIDER_SITE_OTHER): Payer: Self-pay | Admitting: Ophthalmology

## 2024-06-10 DIAGNOSIS — Z961 Presence of intraocular lens: Secondary | ICD-10-CM

## 2024-06-10 NOTE — Progress Notes (Addendum)
 [facility] - Clinic Note  06/10/2024   CHIEF COMPLAINT Patient presents for Retina Evaluation  HISTORY OF PRESENT ILLNESS: Whitney Chavez is a 68 y.o. female who presents to the clinic today for:  HPI     Retina Evaluation   Associated Symptoms Floaters and Blind Spot.  I, the attending physician,  performed the HPI with the patient and updated documentation appropriately.        Comments   Had CEIOL OS on Wednesday w/ Bevis. Pt reports Dr. Lavonia had to use a special tool because capsule was loose. Last night, developed a black line / arc on temporal visual field from 6-11 oclock. +floaters. Currently taking difluprednate and gati QID OS and bromfenac BID OS, and Iyuzeh at bedtime OS. Denies flashes of light. Denies pain. Feels like vision is improving since cataract surgery. ESRD on dialysis. S/p TAVR 08.19.25. h/o Factor V Leiden on eliquis .      Last edited by Valdemar Rogue, MD on 06/10/2024  4:34 PM.     Referring physician: Lavonia Lye, MD [address] [place],  [place] [address]  HISTORICAL INFORMATION:  Selected notes from the MEDICAL RECORD NUMBER Call coverage pt of Dr. Lavonia - presents with +floaters and dark circle in vision LEE:  Ocular Hx- PMH-   CURRENT MEDICATIONS: Current Outpatient Medications (Ophthalmic Drugs)  Medication Sig   naphazoline-pheniramine (ALLERGY EYE) 0.025-0.3 % ophthalmic solution Place 1-2 drops into both eyes 4 (four) times daily as needed for eye irritation or allergies.   No current facility-administered medications for this visit. (Ophthalmic Drugs)   Current Outpatient Medications (Other)  Medication Sig   acetaminophen  (TYLENOL ) 500 MG tablet Take 500 mg by mouth every 6 (six) hours as needed for moderate pain. (Patient not taking: Reported on 06/07/2024)   amiodarone  (PACERONE ) 200 MG tablet Take 1 tablet (200 mg total) by mouth 2 (two) times daily.   amoxicillin  (AMOXIL ) 500 MG capsule Take 4  capsules by mouth as directed. 1 hour prior to dental work including cleanings (Patient not taking: Reported on 06/07/2024)   apixaban  (ELIQUIS ) 5 MG TABS tablet Take 1 tablet (5 mg total) by mouth 2 (two) times daily.   atorvastatin  (LIPITOR) 20 MG tablet Take 20 mg by mouth every evening.   BREYNA  160-4.5 MCG/ACT inhaler Inhale 2 puffs into the lungs 2 (two) times daily.   cetirizine (ZYRTEC) 10 MG tablet Take 10 mg by mouth daily in the afternoon.   Elastic Bandages & Supports (MEDICAL COMPRESSION STOCKINGS) MISC by Does not apply route. Leg wraps.   epoetin alfa (EPOGEN) 2000 UNIT/ML injection 2,000 Units 3 (three) times a week.   Etelcalcetide HCl (PARSABIV IV) Inject 1 Dose into the vein 3 (three) times a week. Receives at Dialysis   fluocinonide ointment (LIDEX) 0.05 % Apply 1 Application topically 2 (two) times daily as needed (psoriasis).   gabapentin  (NEURONTIN ) 100 MG capsule Take 1 capsule (100 mg total) by mouth 4 (four) times daily. (Patient taking differently: Take 100-200 mg by mouth 2 (two) times daily. In the afternoon and in the evening)   lanthanum  (FOSRENOL ) 500 MG chewable tablet Chew 500-1,000 mg by mouth See admin instructions. Take 1-2 tablets ([phone] mg) by mouth with each meal & take 1 tablet (500 mg) with snacks.   multivitamin (RENA-VIT) TABS tablet Take 1 tablet by mouth daily in the afternoon.   neomycin-bacitracin-polymyxin (NEOSPORIN) OINT Apply 1 Application topically as needed for wound care.   omeprazole (  PRILOSEC) 20 MG capsule Take 20 mg by mouth daily as needed (indigestion/heartburn.). (Patient not taking: Reported on 06/07/2024)   ondansetron  (ZOFRAN -ODT) 4 MG disintegrating tablet Take 4 mg by mouth every Monday, Wednesday, and Friday.   oxyCODONE  (ROXICODONE ) 5 MG immediate release tablet Take one po qd for more severe pain (Patient taking differently: Take 5 mg by mouth 2 (two) times daily as needed (restless leg). Take one po qd for more severe pain)    PRESCRIPTION MEDICATION Inhale into the lungs at bedtime. CPAP with nasal pillow   rOPINIRole  (REQUIP ) 0.5 MG tablet TAKE 1-2 TABLETS BY MOUTH IN THE MORNING. (Patient taking differently: Take 0.5 mg by mouth daily in the afternoon.)   rOPINIRole  (REQUIP ) 2 MG tablet Take 1 tablet (2 mg total) by mouth at bedtime.   SODIUM THIOSULFATE IV Inject 1 Dose into the vein 2 (two) times a week. Receives at Dialysis   sulfaSALAzine  (AZULFIDINE ) 500 MG tablet Take 1,000 mg by mouth 2 (two) times daily.   traMADol  (ULTRAM ) 50 MG tablet TAKE 1 TABLET (50 MG TOTAL) BY MOUTH 2 (TWO) TIMES DAILY AS NEEDED (PAIN). (Patient not taking: Reported on 06/07/2024)   triamcinolone cream (KENALOG) 0.1 % Apply 1 Application topically daily as needed (itching). (Patient not taking: Reported on 06/07/2024)   umeclidinium bromide  (INCRUSE ELLIPTA ) 62.5 MCG/ACT AEPB Inhale 1 puff into the lungs daily.   No current facility-administered medications for this visit. (Other)   REVIEW OF SYSTEMS:  ALLERGIES Allergies  Allergen Reactions   Estrogens Other (See Comments)    Blood clots   Other Other (See Comments)    Unable to take birth control pills due to clotting disorder/fim   Prednisone Other (See Comments)    Facial edema - has had this 2020 and had no difficulty can take in small doses   Progesterone Other (See Comments)    Blood clots   Propofol  Other (See Comments)    Became disinhibited and exacerbated restless leg syndrome during EGD/colonoscopy. Patient has requested an alternate form of sedation during future procedures.  Legs started thrashing and skin tears.   Lisinopril Cough   Clotrimazole Rash   Tape Rash and Other (See Comments)    Please use paper tape   PAST MEDICAL HISTORY Past Medical History:  Diagnosis Date   Asthma    Axillary vein thrombosis (HCC) 08/16/2015   Calciphylaxis    Chronic kidney disease    Colitis    COPD (chronic obstructive pulmonary disease) (HCC)    Factor V Leiden     Hypertension    Lymphedema    legs   MRSA (methicillin resistant staph aureus) culture positive    Neuropathy    Obstructive sleep apnea 03/29/2015   OSA on CPAP    S/P TAVR (transcatheter aortic valve replacement) 05/03/2024   s/p TAVR with a 29 mm Medtronic Evlout FX + THV via the TF approach by Dr. Wendel and lightfoot   Severe aortic stenosis    Vision abnormalities    Past Surgical History:  Procedure Laterality Date   APPLICATION OF WOUND VAC     AV FISTULA PLACEMENT Left    BIOPSY  11/05/2022   Procedure: BIOPSY;  Surgeon: Burnette Fallow, MD;  Location: [facility];  Service: Gastroenterology;;   COLONOSCOPY WITH PROPOFOL  Bilateral 11/05/2022   Procedure: COLONOSCOPY WITH PROPOFOL ;  Surgeon: Burnette Fallow, MD;  Location: [facility];  Service: Gastroenterology;  Laterality: Bilateral;   CORONARY ANGIOGRAPHY N/A 03/29/2024   Procedure: CORONARY ANGIOGRAPHY;  Surgeon:  Thukkani, Arun K, MD;  Location: [facility];  Service: Cardiovascular;  Laterality: N/A;   ESOPHAGOGASTRODUODENOSCOPY (EGD) WITH PROPOFOL  Bilateral 03/19/2022   Procedure: ESOPHAGOGASTRODUODENOSCOPY (EGD) WITH PROPOFOL ;  Surgeon: Burnette Fallow, MD;  Location: [facility];  Service: Gastroenterology;  Laterality: Bilateral;   INTRAOPERATIVE TRANSTHORACIC ECHOCARDIOGRAM N/A 05/03/2024   Procedure: ECHOCARDIOGRAM, TRANSTHORACIC;  Surgeon: Wendel Lurena POUR, MD;  Location: [facility];  Service: Cardiovascular;  Laterality: N/A;   LOWER EXTREMITY ANGIOGRAPHY  03/29/2024   Procedure: Lower Extremity Angiography;  Surgeon: Wendel Lurena POUR, MD;  Location[facility]AS[facility] CV [facility];  Service: Cardiovascular;;   lumbar decompression fusion     TONSILLECTOMY     UPPER EXTREMITY VENOGRAPHY  03/29/2024   Procedure: UPPER EXTREMITY VENOGRAPHY;  Surgeon: Wendel Lurena POUR, MD;  Location: [facility];  Service: Cardiovascular;;   FAMILY HISTORY Family History  Problem Relation Age of Onset   Diabetes Mother     Congestive Heart Failure Mother    COPD Mother    Stroke Mother    Neuropathy Mother    Heart disease Father    Stroke Father    Cancer Brother    Diabetes Brother    Diabetes Brother    Alcohol abuse Brother    SOCIAL HISTORY Social History   Tobacco Use   Smoking status: Former    Current packs/day: 0.00    Average packs/day: 2.0 packs/day for 30.0 years (60.0 ttl pk-yrs)    Types: Cigarettes    Start date: 11/24/1968    Quit date: 11/25/1998    Years since quitting: 25.5   Smokeless tobacco: Never  Vaping Use   Vaping status: Never Used  Substance Use Topics   Alcohol use: No    Alcohol/week: 0.0 standard drinks of alcohol   Drug use: No       OPHTHALMIC EXAM:  Base Eye Exam     Visual Acuity (Snellen - Linear)       Right Left   Dist [place] 20/30 -2 20/50 +2   Dist ph  20/25 -1 20/40 +1         Tonometry (Tonopen, 4:48 PM)       Right Left   Pressure 12 9         Pupils       Dark Light Shape React APD   Right 3 2 Round + -   Left 1 1 Round - ?         Visual Fields (Counting fingers)       Left Right    Full Full         Extraocular Movement       Right Left    Full, Ortho Full, Ortho         Neuro/Psych     Oriented x3: Yes   Mood/Affect: Normal         Dilation     Both eyes: 1.0% Mydriacyl, 2.5% Phenylephrine  @ 4:47 PM           Slit Lamp and Fundus Exam     Slit Lamp Exam       Right Left   Lids/Lashes dermato dermato   Conjunctiva/Sclera white, quiet white, quiet   Cornea clear tr PEE; well-healed cataract wound   Anterior Chamber mod depth; narrow angles deep; +vit strands around nasal edge of IOL to wound w/ +pigment;   Iris round; dilated round; +iridodonesis   Lens 3+ NSC; 2-3+ CC PCIOL   Anterior Vitreous  syneresis syneresis; vitreous prolapse around nasal edge of optic         Fundus Exam       Right Left   Disc pink sharp compact pink sharp compact; punctate heme at 5 oclock   C/D Ratio  0.2 0.2   Macula flat; good foveal reflex; no heme or edema flat; good foveal reflex; no heme or edema   Vessels mild attenuation mild attenuation   Periphery attached; no heme attached; no heme           IMAGING AND PROCEDURES  Imaging and Procedures for 06/10/2024        ASSESSMENT/PLAN:   ICD-10-CM   1. Pseudophakia  Z96.1      1. Pseudophakia OS  - POD2 s/p CE/IOL OS w/ mild vitreous prolapse (Dr. Lavonia, 09.24.25)  - pt reports dark circle / arc in temporal visual field from 6-11 oclock w/ +floaters  - exam shows IOL in good position and mild vit prolapse around nasal edge of optic  - dilated exam without RT or RD OS  - BCVA OS 20/40  - IOL in good position  - post op drops per Dr. Lavonia  - pt has post op f/u with Dr. Lavonia next week  Ophthalmic Meds Ordered this visit:  No orders of the defined types were placed in this encounter.    Return if symptoms worsen or fail to improve.  There are no Patient Instructions on file for this visit.  Explained the diagnoses, plan, and follow up with the patient and they expressed understanding.  Patient expressed understanding of the importance of proper follow up care.   [place] JUDITHANN Hans, M.D., Ph.D. Diseases & Surgery of the Retina and Vitreous [facility] 06/10/2024  Abbreviations: M myopia (nearsighted); A astigmatism; H hyperopia (farsighted); P presbyopia; Mrx spectacle prescription;  CTL contact lenses; OD right eye; OS left eye; OU both eyes  XT exotropia; ET esotropia; PEK punctate epithelial keratitis; PEE punctate epithelial erosions; DES dry eye syndrome; MGD meibomian gland dysfunction; ATs artificial tears; PFAT's preservative free artificial tears; NSC nuclear sclerotic cataract; PSC posterior subcapsular cataract; ERM epi-retinal membrane; PVD posterior vitreous detachment; RD retinal detachment; DM diabetes mellitus; DR diabetic retinopathy; NPDR non-proliferative diabetic retinopathy; PDR  proliferative diabetic retinopathy; CSME clinically significant macular edema; DME diabetic macular edema; dbh dot blot hemorrhages; CWS cotton wool spot; POAG primary open angle glaucoma; C/D cup-to-disc ratio; HVF humphrey visual field; GVF goldmann visual field; OCT optical coherence tomography; IOP intraocular pressure; BRVO Branch retinal vein occlusion; CRVO central retinal vein occlusion; CRAO central retinal artery occlusion; BRAO branch retinal artery occlusion; RT retinal tear; SB scleral buckle; PPV pars plana vitrectomy; VH Vitreous hemorrhage; PRP panretinal laser photocoagulation; IVK intravitreal kenalog; VMT vitreomacular traction; MH Macular hole;  NVD neovascularization of the disc; NVE neovascularization elsewhere; AREDS age related eye disease study; ARMD age related macular degeneration; POAG primary open angle glaucoma; EBMD epithelial/anterior basement membrane dystrophy; ACIOL anterior chamber intraocular lens; IOL intraocular lens; PCIOL posterior chamber intraocular lens; Phaco/IOL phacoemulsification with intraocular lens placement; PRK photorefractive keratectomy; LASIK laser assisted in situ keratomileusis; HTN hypertension; DM diabetes mellitus; COPD chronic obstructive pulmonary disease

## 2024-06-14 NOTE — Progress Notes (Unsigned)
 [facility]   MULTIDISCIPLINARY HEART VALVE CLINIC                                     Cardiology Office Note:    Date:  06/14/2024   ID:  [name], DOB April 08, 1956, MRN [id number]  PCP:  Whitney Planas, MD  [facility] Cardiologist:  Whitney MARLA Red, MD  [facility] Structural heart: Whitney MARLA Red, MD [facility] Electrophysiologist:  Whitney Kitty, MD   Referring MD: Whitney Planas, MD   1 month s/p TAVR  History of Present Illness:    Whitney Chavez is a 68 y.o. female with a hx of ESRD (HD MWF), severe restless legs, Factor V Leyden heterozygous + Prothrombin II mutation on Eliquis , PAF, anemia of chronic disease, HLD, HTN, OSA on CPAP and severe aortic stenosis s/p TAVR 05/03/24 who presents to clinic for follow up.   She has been on dialysis for 14 years, with a well-functioning fistula. She developed renal failure 2/2 vitamin D toxicity and calcium  overload. She experiences episodes of atrial fibrillation, typically when excessive fluid is removed during dialysis, leading to her feeling 'too dry.' She is on Eliquis  5 mg twice daily for factor V Leiden and prothrombin gene mutation, and she undergoes bridge therapy with heparin  before surgeries. She has a history of calciphylaxis and underwent a punch biopsy, followed by further surgery for a non-healing wound. She has a history of known aortic stenosis that was followed at [facility]; however, the pt wanted to transfer her care to [facility] and was set up with Whitney Chavez. Echo on 01/12/24 at [facility] showed EF 60% and severe AS with a mean grad 40.2 mmHg & AVA 0.78 cm2. Coronary angiography showed minimal nonobstructive CAD. Right heart catheterization was deferred due to occluded venous system of the right upper extremity and right internal jugular systems. S/p successful TAVR with a 29 mm Medtronic Evlout FX + THV via the TF approach on 05/03/24. She was intubated and sedated for operation and post op recovery due to severe  RLS. Post operative echo showed EF 70%, mild LVH, mod MAC, normally functioning TAVR with a mean gradient of 15.5 mmHg and no PVL.   Readmitted 9/10-9/11/25 for atrial flutter and started on amiodarone . Seen by Whitney Chavez on 06/07/24 for consideration of ablation, which she is still currently thinking about.   Today the patient presents to clinic for follow up.     Past Medical History:  Diagnosis Date   Asthma    Axillary vein thrombosis (HCC) 08/16/2015   Calciphylaxis    Chronic kidney disease    Colitis    COPD (chronic obstructive pulmonary disease) (HCC)    Factor V Leiden    Hypertension    Lymphedema    legs   MRSA (methicillin resistant staph aureus) culture positive    Neuropathy    Obstructive sleep apnea 03/29/2015   OSA on CPAP    S/P TAVR (transcatheter aortic valve replacement) 05/03/2024   s/p TAVR with a 29 mm Medtronic Evlout FX + THV via the TF approach by Whitney Chavez and lightfoot   Severe aortic stenosis    Vision abnormalities      Current Medications: No outpatient medications have been marked as taking for the 06/16/24 encounter (Appointment) with Whitney Whitney SAUNDERS, PA-C.      ROS:   Please see the history of present illness.  All other systems reviewed and are negative.  EKGs       Risk Assessment/Calculations:       {This patient may be at risk for Amyloid. She has one or more dx on the prob list or PMH from the following list -  Abnormal EKG, HFpEF/Diastolic CHF, Aortic Stenosis, LVH, Bilateral Carpal Tunnel Syndrome, Biceps Tendon Rupture, Spinal Stenosis, Pericardial Effusion, Left Atrial Enlargement, Conduction System Disorder. See list below or review PMH.  Diagnoses From Problem List           Noted     Atrial fibrillation with RVR (HCC) 05/26/2024     Polyneuropathy 03/29/2015     Severe aortic stenosis 05/03/2024    Click HERE to open Cardiac Amyloid Screening SmartSet to order screening OR Click HERE to defer testing for 1  year or permanently :1}    Physical Exam:    VS:  There were no vitals taken for this visit.    Wt Readings from Last 3 Encounters:  06/07/24 180 lb 3.2 oz (81.7 kg)  05/25/24 177 lb (80.3 kg)  05/11/24 177 lb (80.3 kg)     GEN: Well nourished, well developed in no acute distress NECK: No JVD CARDIAC: RRR, no murmurs, rubs, gallops RESPIRATORY:  Clear to auscultation without rales, wheezing or rhonchi  ABDOMEN: Soft, non-tender, non-distended EXTREMITIES:  No edema; No deformity.    ASSESSMENT:    1. S/P TAVR (transcatheter aortic valve replacement)   2. ESRD (end stage renal disease) (HCC)   3. Factor 5 Leiden mutation, heterozygous   4. Hyperlipidemia LDL goal <70   5. PAF (paroxysmal atrial fibrillation) (HCC)     PLAN:    In order of problems listed above:  Severe AS s/p TAVR:  -- Echo today shows EF ***, normally functioning TAVR with a mean gradient of *** mm hg and *** PVL.  -- NYHA class ***.  -- SBE prophylaxis discussed;she has RX'd amoxicillin .   -- Continue Eliquis  5mg  BID.  -- I will see back for 1 year echo and OV.   ESRD on HD:  -- M,W,F HD.    Factor V Leyden mutation: -- Continue Eliquis  5 mg twice daily.   Hyperlipidemia:  -- Continue atorvastatin  20 mg daily.   PAF: -- Recent admission for atrial flutter and now considering ablation with Dr. Kennyth. -- Continue amiodarone  200mg  BID.  -- Continue Eliquis  5mg  BID.   Medication Adjustments/Labs and Tests Ordered: Current medicines are reviewed at length with the patient today.  Concerns regarding medicines are outlined above.  No orders of the defined types were placed in this encounter.  No orders of the defined types were placed in this encounter.   There are no Patient Instructions on file for this visit.   Signed, Whitney Hummer, PA-C  06/14/2024 12:53 PM    [facility]

## 2024-06-16 ENCOUNTER — Ambulatory Visit (HOSPITAL_COMMUNITY)
Admission: RE | Admit: 2024-06-16 | Discharge: 2024-06-16 | Disposition: A | Discharge status: Home / Self Care | Source: Ambulatory Visit | Attending: Cardiology | Admitting: Cardiology

## 2024-06-16 ENCOUNTER — Ambulatory Visit (INDEPENDENT_AMBULATORY_CARE_PROVIDER_SITE_OTHER): Admitting: Physician Assistant

## 2024-06-16 VITALS — BP 118/60 | HR 68 | Ht 60.0 in | Wt 178.8 lb

## 2024-06-16 DIAGNOSIS — Z952 Presence of prosthetic heart valve: Secondary | ICD-10-CM

## 2024-06-16 DIAGNOSIS — I480 Paroxysmal atrial fibrillation: Secondary | ICD-10-CM | POA: Diagnosis not present

## 2024-06-16 DIAGNOSIS — N186 End stage renal disease: Secondary | ICD-10-CM | POA: Insufficient documentation

## 2024-06-16 DIAGNOSIS — D6851 Activated protein C resistance: Secondary | ICD-10-CM

## 2024-06-16 DIAGNOSIS — E785 Hyperlipidemia, unspecified: Secondary | ICD-10-CM | POA: Diagnosis present

## 2024-06-16 LAB — ECHOCARDIOGRAM COMPLETE
AR max vel: 1.77 cm2
AV Area VTI: 1.75 cm2
AV Area mean vel: 1.68 cm2
AV Mean grad: 14.7 mmHg
AV Peak grad: 26.2 mmHg
Ao pk vel: 2.56 m/s
Area-P 1/2: 2.71 cm2
S' Lateral: 1.9 cm

## 2024-06-16 NOTE — Patient Instructions (Signed)
 Medication Instructions:  Your physician recommends that you continue on your current medications as directed. Please refer to the Current Medication list given to you today.  *If you need a refill on your cardiac medications before your next appointment, please call your pharmacy*  Lab Work: None needed If you have labs (blood work) drawn today and your tests are completely normal, you will receive your results only by: MyChart Message (if you have MyChart) OR A paper copy in the mail If you have any lab test that is abnormal or we need to change your treatment, we will call you to review the results.  Testing/Procedures: 05/11/2025 Your physician has requested that you have an echocardiogram. Echocardiography is a painless test that uses sound waves to create images of your heart. It provides your doctor with information about the size and shape of your heart and how well your heart's chambers and valves are working. This procedure takes approximately one hour. There are no restrictions for this procedure. Please do NOT wear cologne, perfume, aftershave, or lotions (deodorant is allowed). Please arrive 15 minutes prior to your appointment time.  Please note: We ask at that you not bring children with you during ultrasound (echo/ vascular) testing. Due to room size and safety concerns, children are not allowed in the ultrasound rooms during exams. Our front office staff cannot provide observation of children in our lobby area while testing is being conducted. An adult accompanying a patient to their appointment will only be allowed in the ultrasound room at the discretion of the ultrasound technician under special circumstances. We apologize for any inconvenience.   Follow-Up: At [facility], you and your health needs are our priority.  As part of our continuing mission to provide you with exceptional heart care, our providers are all part of one team.  This team includes your primary  Cardiologist (physician) and Advanced Practice Providers or APPs (Physician Assistants and Nurse Practitioners) who all work together to provide you with the care you need, when you need it.  Your next appointment:   As scheduled on 09/01/24 at 11AM  Provider:   Lurena Red, MD    We recommend signing up for the patient portal called MyChart.  Sign up information is provided on this After Visit Summary.  MyChart is used to connect with patients for Virtual Visits (Telemedicine).  Patients are able to view lab/test results, encounter notes, upcoming appointments, etc.  Non-urgent messages can be sent to your provider as well.   To learn more about what you can do with MyChart, go to [email or web address].

## 2024-06-17 ENCOUNTER — Ambulatory Visit: Payer: Self-pay | Admitting: Physician Assistant

## 2024-06-19 ENCOUNTER — Other Ambulatory Visit: Payer: Self-pay | Admitting: Neurology

## 2024-06-20 NOTE — Telephone Encounter (Signed)
 Last seen on 02/23/24 Follow up scheduled on 09/27/24   Dispensed Days Supply Quantity Provider Pharmacy  TRAMADOL  HCL 50 MG TABLET 05/17/2024 30 60 each Sater, Charlie LABOR, MD [facility] [phone] - J...     Rx pending to be signed

## 2024-06-21 ENCOUNTER — Other Ambulatory Visit: Payer: Self-pay

## 2024-06-21 NOTE — Telephone Encounter (Signed)
 Called [facility] at [phone]. Spoke w/ Glade. States Tramadol  ready for pick up, nothing needed from our office. Aware we will let pt know.

## 2024-06-27 ENCOUNTER — Other Ambulatory Visit (HOSPITAL_COMMUNITY)

## 2024-06-27 ENCOUNTER — Ambulatory Visit: Admitting: Physician Assistant

## 2024-06-29 ENCOUNTER — Telehealth: Payer: Self-pay

## 2024-06-29 NOTE — Telephone Encounter (Signed)
 Left message for patient to call back to schedule an ablation with Dr. Kennyth.

## 2024-07-12 ENCOUNTER — Encounter (HOSPITAL_BASED_OUTPATIENT_CLINIC_OR_DEPARTMENT_OTHER): Payer: Self-pay | Admitting: Pulmonary Disease

## 2024-07-13 ENCOUNTER — Other Ambulatory Visit (HOSPITAL_BASED_OUTPATIENT_CLINIC_OR_DEPARTMENT_OTHER): Payer: Self-pay

## 2024-07-13 MED ORDER — INCRUSE ELLIPTA 62.5 MCG/ACT IN AEPB: 1.0000 | INHALATION_SPRAY | Freq: Every day | RESPIRATORY_TRACT | 3 refills | Status: AC

## 2024-07-13 MED ORDER — BREYNA 160-4.5 MCG/ACT IN AERO: 2.0000 | INHALATION_SPRAY | Freq: Two times a day (BID) | RESPIRATORY_TRACT | 2 refills | Status: DC

## 2024-07-14 ENCOUNTER — Other Ambulatory Visit: Payer: Self-pay

## 2024-07-14 MED ORDER — BUDESONIDE-FORMOTEROL FUMARATE 160-4.5 MCG/ACT IN AERO: 2.0000 | INHALATION_SPRAY | Freq: Two times a day (BID) | RESPIRATORY_TRACT | 3 refills | Status: AC

## 2024-07-14 NOTE — Telephone Encounter (Signed)
 Its okay, no worries have a wonderful weekend!

## 2024-07-21 ENCOUNTER — Encounter: Payer: Self-pay | Admitting: Neurology

## 2024-07-21 ENCOUNTER — Other Ambulatory Visit: Payer: Self-pay | Admitting: Neurology

## 2024-07-21 MED ORDER — OXYCODONE HCL 5 MG PO TABS: ORAL_TABLET | ORAL | 0 refills | Status: DC

## 2024-08-17 ENCOUNTER — Other Ambulatory Visit: Payer: Self-pay

## 2024-08-17 MED ORDER — ROPINIROLE HCL 2 MG PO TABS: 2.0000 mg | ORAL_TABLET | Freq: Every day | ORAL | 3 refills | Status: DC

## 2024-08-24 ENCOUNTER — Other Ambulatory Visit: Payer: Self-pay | Admitting: *Deleted

## 2024-08-24 MED ORDER — GABAPENTIN 100 MG PO CAPS: 100.0000 mg | ORAL_CAPSULE | Freq: Two times a day (BID) | ORAL | 0 refills | Status: DC

## 2024-08-24 NOTE — Telephone Encounter (Signed)
 Last seen on 02/23/24 Follow up scheduled 09/27/24

## 2024-08-28 NOTE — Progress Notes (Unsigned)
 Patient ID: Whitney Chavez MRN: [id number] DOB/AGE: 02/01/1956 68 y.o.  Primary Care Physician:Miller, Olam, MD Primary Cardiologist: Wendel (new)  CC:  Aortic valvular disease management     FOCUSED PROBLEM LIST:   Aortic stenosis Status post 29 mm Evolut FX+ August 2025 End-stage renal disease Recurrent thromboembolic disease Factor V Leyden heterozygous + Prothrombin II mutation On indefinite Eliquis  Bridging with SQ heparin  for procedures PAF On Eliquis  Atrial flutter On Eliquis  Anemia Secondary to ESRD On EPO Hyperlipidemia Aortic atherosclerosis CT abdomen pelvis 2019 OSA On CPAP BMI 33/BSA 1.86  June 2025:  Patient consents to use of AI scribe. The patient is a 68 year old female with the above listed medical is referred for recommendations regarding her aortic valvular disease.  The patient had been seen at [facility] and was being evaluated for TAVR procedure.  She is here to discuss further.  She has been experiencing increasing fatigue over the past few months, which she attributes to her dialysis sessions and knee problems. She denies significant shortness of breath or chest pain but notes increased tiredness and occasional dizziness, with one episode of visual disturbance about three to four weeks ago.  She has been on dialysis for 14 years, with a well-functioning fistula. She experiences episodes of atrial fibrillation, typically when excessive fluid is removed during dialysis, leading to her feeling 'too dry.' She is on Eliquis  5 mg twice daily for factor V Leiden and prothrombin gene mutation, and she undergoes bridge therapy with heparin  before surgeries. She has a history of calciphylaxis and underwent a punch biopsy, followed by further surgery for a non-healing wound.  Her past medical history includes severe aortic stenosis, for which she is being evaluated. She uses a CPAP machine for severe sleep apnea and has a history of back surgery in 2007 for L4-L5  decompression. She also reports restless leg syndrome, for which she occasionally takes oxycodone  5 mg.  She lives alone and manages her daily activities with some assistance from neighbors and a friend she met at dialysis. Her family resides in [place]  and [place] . She has a history of significant weight loss, attributed to her dialysis treatment, and she wears leg wraps to manage lymphedema, which have been effective in reducing fluid retention in her legs.  She has a history of bleeding easily, with prolonged bleeding from minor cuts and bruises, attributed to her anticoagulation therapy. No recent episodes of chest pain, shortness of breath, or significant bleeding events such as epistaxis or melena.  She sees a education officer, community on a regular basis and reports good dental health.  She does need some dental work but nothing that is symptomatic is bothering her.  Plan: Refer for TAVR.  December 2025:  Patient consents to use of AI scribe. The patient underwent TAVR in August 2025 which was uncomplicated.  She was seen by EP in the interim due to symptomatic flutter and atrial fibrillation.  She is thinking about pursuing an ablation procedure with Dr. Kennyth.  No further episodes of atrial fibrillation or flutter have occurred since, except for one incident that led to an ER visit a few weeks after the procedure.  She has a history of restless leg syndrome, which has worsened recently. Her legs have been 'really bad lately,' requiring oxycodone  during dialysis treatments to manage symptoms. She experiences significant discomfort during dialysis, leading to infiltration and bruising of her arm.  She has been on dialysis for 14 years and has a long-standing fistula that has been functioning well for  13 years. She reports occasional low blood pressure during dialysis sessions. She takes 200 mg of amiodarone  in the morning and 200 mg in the evening.  She has a history of cataract surgery and fluctuating hemoglobin  levels, which affect her ability to take medications consistently, including her cholesterol medication. Occasional vomiting also impacts her medication adherence.  She has lymphedema in her legs and wears leg wraps on dialysis days. She reports dry mouth and difficulty maintaining her diet, which she attributes to changes in taste and appetite.        Past Medical History:  Diagnosis Date   Asthma    Axillary vein thrombosis (HCC) 08/16/2015   Calciphylaxis    Chronic kidney disease    Colitis    COPD (chronic obstructive pulmonary disease) (HCC)    Factor V Leiden    Hypertension    Lymphedema    legs   MRSA (methicillin resistant staph aureus) culture positive    Neuropathy    Obstructive sleep apnea 03/29/2015   OSA on CPAP    S/P TAVR (transcatheter aortic valve replacement) 05/03/2024   s/p TAVR with a 29 mm Medtronic Evlout FX + THV via the TF approach by Dr. Wendel and lightfoot   Severe aortic stenosis    Vision abnormalities     Past Surgical History:  Procedure Laterality Date   APPLICATION OF WOUND VAC     AV FISTULA PLACEMENT Left    BIOPSY  11/05/2022   Procedure: BIOPSY;  Surgeon: Burnette Fallow, MD;  Location: [facility];  Service: Gastroenterology;;   COLONOSCOPY WITH PROPOFOL  Bilateral 11/05/2022   Procedure: COLONOSCOPY WITH PROPOFOL ;  Surgeon: Burnette Fallow, MD;  Location: [facility];  Service: Gastroenterology;  Laterality: Bilateral;   CORONARY ANGIOGRAPHY N/A 03/29/2024   Procedure: CORONARY ANGIOGRAPHY;  Surgeon: Wendel Lurena POUR, MD;  Location: [facility];  Service: Cardiovascular;  Laterality: N/A;   ESOPHAGOGASTRODUODENOSCOPY (EGD) WITH PROPOFOL  Bilateral 03/19/2022   Procedure: ESOPHAGOGASTRODUODENOSCOPY (EGD) WITH PROPOFOL ;  Surgeon: Burnette Fallow, MD;  Location: [facility];  Service: Gastroenterology;  Laterality: Bilateral;   INTRAOPERATIVE TRANSTHORACIC ECHOCARDIOGRAM N/A 05/03/2024   Procedure: ECHOCARDIOGRAM, TRANSTHORACIC;   Surgeon: Wendel Lurena POUR, MD;  Location: [facility];  Service: Cardiovascular;  Laterality: N/A;   LOWER EXTREMITY ANGIOGRAPHY  03/29/2024   Procedure: Lower Extremity Angiography;  Surgeon: Wendel Lurena POUR, MD;  Location: [facility];  Service: Cardiovascular;;   lumbar decompression fusion     TONSILLECTOMY     UPPER EXTREMITY VENOGRAPHY  03/29/2024   Procedure: UPPER EXTREMITY VENOGRAPHY;  Surgeon: Wendel Lurena POUR, MD;  Location: [facility];  Service: Cardiovascular;;    Family History  Problem Relation Age of Onset   Diabetes Mother    Congestive Heart Failure Mother    COPD Mother    Stroke Mother    Neuropathy Mother    Heart disease Father    Stroke Father    Cancer Brother    Diabetes Brother    Diabetes Brother    Alcohol abuse Brother     Social History   Socioeconomic History   Marital status: Single    Spouse name: Not on file   Number of children: Not on file   Years of education: Not on file   Highest education level: Not on file  Occupational History   Not on file  Tobacco Use   Smoking status: Former    Current packs/day: 0.00    Average packs/day: 2.0 packs/day for  30.0 years (60.0 ttl pk-yrs)    Types: Cigarettes    Start date: 11/24/1968    Quit date: 11/25/1998    Years since quitting: 25.7   Smokeless tobacco: Never  Vaping Use   Vaping status: Never Used  Substance and Sexual Activity   Alcohol use: No    Alcohol/week: 0.0 standard drinks of alcohol   Drug use: No   Sexual activity: Not on file  Other Topics Concern   Not on file  Social History Narrative   Not on file   Social Drivers of Health   Tobacco Use: Medium Risk (09/01/2024)   Patient History    Smoking Tobacco Use: Former    Smokeless Tobacco Use: Never    Passive Exposure: Not on Actuary Strain: Not on file  Food Insecurity: Patient Declined (05/04/2024)   Epic    Worried About Programme Researcher, Broadcasting/film/video in the Last Year: Patient declined     Barista in the Last Year: Patient declined  Transportation Needs: Patient Declined (05/04/2024)   Epic    Lack of Transportation (Medical): Patient declined    Lack of Transportation (Non-Medical): Patient declined  Physical Activity: Not on file  Stress: Not on file  Social Connections: Patient Declined (05/04/2024)   Social Connection and Isolation Panel    Frequency of Communication with Friends and Family: Patient declined    Frequency of Social Gatherings with Friends and Family: Patient declined    Attends Religious Services: Patient declined    Active Member of Clubs or Organizations: Patient declined    Attends Banker Meetings: Patient declined    Marital Status: Patient declined  Intimate Partner Violence: Patient Declined (05/04/2024)   Epic    Fear of Current or Ex-Partner: Patient declined    Emotionally Abused: Patient declined    Physically Abused: Patient declined    Sexually Abused: Patient declined  Depression (PHQ2-9): Low Risk (08/30/2024)   Depression (PHQ2-9)    PHQ-2 Score: 0  Alcohol Screen: Not on file  Housing: Unknown (05/04/2024)   Epic    Unable to Pay for Housing in the Last Year: Patient declined    Number of Times Moved in the Last Year: 0    Homeless in the Last Year: Patient declined  Utilities: Patient Declined (05/04/2024)   Epic    Threatened with loss of utilities: Patient declined  Health Literacy: Not on file     Prior to Admission medications   Medication Sig Start Date End Date Taking? Authorizing Provider  acetaminophen  (TYLENOL ) 500 MG tablet Take 500 mg by mouth every 6 (six) hours as needed for moderate pain.    [provider]  amiodarone  (PACERONE ) 200 MG tablet Take 200 mg by mouth in the morning. 12/28/18   [provider]  apixaban  (ELIQUIS ) 5 MG TABS tablet Take 1 tablet (5 mg total) by mouth 2 (two) times daily. 10/13/22 03/01/24  Timmy Maude SAUNDERS, MD  atorvastatin  (LIPITOR) 20 MG tablet  Take 20 mg by mouth every evening. 01/03/19   [provider]  B Complex-C-Folic Acid  (RENAL VITAMIN PO) Take 1 tablet by mouth daily.    [provider]  BREYNA  160-4.5 MCG/ACT inhaler Inhale 2 puffs into the lungs 2 (two) times daily. 09/04/23   Kassie Acquanetta Bradley, MD  cetirizine (ZYRTEC) 10 MG tablet Take 10 mg by mouth in the morning.    [provider]  Elastic Bandages & Supports (MEDICAL COMPRESSION STOCKINGS) MISC by  Does not apply route. Leg wraps.    [provider]  epoetin alfa (EPOGEN) 2000 UNIT/ML injection 2,000 Units 3 (three) times a week.    [provider]  Etelcalcetide HCl (PARSABIV IV) Inject 1 Dose into the vein 3 (three) times a week. Receives at Dialysis    [provider]  gabapentin  (NEURONTIN ) 100 MG capsule Take 1 capsule (100 mg total) by mouth 4 (four) times daily. 08/04/23   Lomax, Amy, NP  heparin  5000 UNIT/ML injection Inject 1 mL (5,000 Units total) into the skin 2 (two) times daily. To be given subcutaneously for 3 days prior to surgery 03/02/24   Tonette Lauraine HERO, PA-C  lanthanum  (FOSRENOL ) 500 MG chewable tablet Chew 1,000-2,000 mg by mouth 2 (two) times daily with a meal.  500 mg with each snack 12/04/16   [provider]  omeprazole (PRILOSEC) 20 MG capsule Take 20 mg by mouth daily as needed (acid reflux). Rarely takes    [provider]  ondansetron  (ZOFRAN -ODT) 4 MG disintegrating tablet Take 4 mg by mouth every Monday, Wednesday, and Friday.    [provider]  Ondansetron  HCl (ZOFRAN  PO) Take 1 tablet by mouth as needed.    [provider]  oxyCODONE  (ROXICODONE ) 5 MG immediate release tablet Take one po qd formore severe pain 12/14/23   Sater, Charlie LABOR, MD  PRESCRIPTION MEDICATION Inhale into the lungs at bedtime. CPAP with nasal pillow    [provider]  rOPINIRole  (REQUIP ) 0.5 MG tablet TAKE 1-2 TABLETS BY MOUTH IN THE MORNING. 08/04/23   Lomax, Amy, NP   rOPINIRole  (REQUIP ) 2 MG tablet Take 1 tablet (2 mg total) by mouth at bedtime. 08/04/23   Lomax, Amy, NP  SODIUM THIOSULFATE IV Inject 1 Dose into the vein 3 (three) times a week. Receives at Dialysis    [provider]  sulfaSALAzine  (AZULFIDINE ) 500 MG tablet Take 500 mg by mouth 4 (four) times daily. 02/15/15   [provider]  traMADol  (ULTRAM ) 50 MG tablet TAKE 1 TABLET (50 MG TOTAL) BY MOUTH 2 (TWO) TIMES DAILY AS NEEDED (PAIN). 12/17/23   Sater, Charlie LABOR, MD  triamcinolone cream (KENALOG) 0.1 % Apply 1 Application topically daily as needed (itching). 08/31/18   [provider]  umeclidinium bromide  (INCRUSE ELLIPTA ) 62.5 MCG/ACT AEPB Inhale 1 puff into the lungs daily. 09/03/23   Kassie Acquanetta Bradley, MD    Allergies  Allergen Reactions   Estrogens Other (See Comments)    Blood clots   Other Other (See Comments)    Unable to take birth control pills due to clotting disorder/fim   Prednisone Other (See Comments)    Facial edema - has had this 2020 and had no difficulty can take in small doses   Progesterone Other (See Comments)    Blood clots   Propofol  Other (See Comments)    Became disinhibited and exacerbated restless leg syndrome during EGD/colonoscopy. Patient has requested an alternate form of sedation during future procedures.  Legs started thrashing and skin tears.   Lisinopril Cough   Clotrimazole Rash   Tape Rash and Other (See Comments)    Please use paper tape    REVIEW OF SYSTEMS:  General: no fevers/chills/night sweats Eyes: no blurry vision, diplopia, or amaurosis ENT: no sore throat or hearing loss Resp: no cough, wheezing, or hemoptysis CV: no edema or palpitations GI: no abdominal pain, nausea, vomiting, diarrhea, or constipation GU: no dysuria, frequency, or hematuria Skin: no rash Neuro: no headache,  numbness, tingling, or weakness of extremities Musculoskeletal: no joint pain or swelling Heme: no bleeding, DVT, or easy  bruising Endo: no polydipsia or polyuria  BP 112/70   Pulse 69   Ht 5' (1.524 m)   Wt 183 lb (83 kg)   SpO2 96%   BMI 35.74 kg/m   PHYSICAL EXAM: GEN:  AO x 3 in no acute distress HEENT: normal Dentition: Normal Neck: JVP normal. +2 carotid upstrokes without bruits. No thyromegaly. Lungs: equal expansion, clear bilaterally CV: Apex is discrete and nondisplaced, RRR with 3/6 SEM Abd: soft, non-tender, non-distended; no bruit; positive bowel sounds Ext: no edema, ecchymoses, or cyanosis Vascular: 2+ femoral pulses, 2+ radial pulses       Skin: warm and dry without rash Neuro: CN II-XII grossly intact; motor and sensory grossly intact    DATA AND STUDIES:  EKG: September 2025 atrial flutter  EKG Interpretation Date/Time:    Ventricular Rate:    PR Interval:    QRS Duration:    QT Interval:    QTC Calculation:   R Axis:      Text Interpretation:          Cardiac Studies & Procedures   ______________________________________________________________________________________________ CARDIAC CATHETERIZATION  CARDIAC CATHETERIZATION 03/29/2024  Conclusion 1.  Minimal, nonobstructive coronary artery disease. 2.  Right heart catheterization was deferred due to occluded venous system of the right upper extremity and right internal jugular systems.  Left AC approach was not pursued given left upper extremity fistula.  A right femoral approach was not pursued given history of restless legs. 3.  Capacious iliofemoral vessels bilaterally.  Recommendation: Continue evaluation for aortic valve intervention.  Resume Eliquis  tomorrow.  Findings Coronary Findings Diagnostic  Dominance: Right  Left Anterior Descending The vessel exhibits minimal luminal irregularities.  Left Circumflex The vessel exhibits minimal luminal irregularities.  Right Coronary Artery The vessel exhibits minimal luminal irregularities.  Intervention  No interventions have been documented.      ECHOCARDIOGRAM  ECHOCARDIOGRAM COMPLETE 06/16/2024  Narrative ECHOCARDIOGRAM REPORT    Patient Name:   Whitney Chavez Date of Exam: 06/16/2024 Medical Rec #:  [id number]     Height:       60.0 in Accession #:    [id number]    Weight:       180.2 lb Date of Birth:  [birth year]    BSA:          1.786 m Patient Age:    23 years      BP:           97/61 mmHg Patient Gender: F             HR:           64 bpm. Exam Location:  [address]  Procedure: 2D Echo, 3D Echo, Cardiac Doppler, Color Doppler and Strain Analysis (Both Spectral and Color Flow Doppler were utilized during procedure).  Indications:     Z95.2 Status post TAVR  History:         Patient has prior history of Echocardiogram examinations, most recent 05/04/2024. COPD, Aortic Valve Disease, Arrythmias:Atrial Fibrillation and Atrial Flutter; Risk Factors:Sleep Apnea, Former Smoker, Hypertension and Family History of Coronary Artery Disease. Aortic Stenosis status post TAVR (05/03/24, 29mm Medtronic Evolut FX+), Factor V Lieden. Aortic Valve: 29 mm Medtronic Evolut valve is present in the aortic position.  Sonographer:     Heather Hawks RDCS Referring Phys:  OLAM PINAL Diagnosing Phys: Lonni Nanas MD  IMPRESSIONS   1.  Left ventricular ejection fraction, by estimation, is 65 to 70%. Left ventricular ejection fraction by 3D volume is 73 %. The left ventricle has normal function. The left ventricle has no regional wall motion abnormalities. There is mild left ventricular hypertrophy. Left ventricular diastolic parameters were normal. The average left ventricular global longitudinal strain is -23.5 %. The global longitudinal strain is normal. 2. Right ventricular systolic function is normal. The right ventricular size is normal. There is normal pulmonary artery systolic pressure. The estimated right ventricular systolic pressure is 28.4 mmHg. 3. Right atrial size was mildly dilated. 4. The mitral valve is  degenerative. Trivial mitral valve regurgitation. No evidence of mitral stenosis. Moderate mitral annular calcification. 5. The inferior vena cava is normal in size with greater than 50% respiratory variability, suggesting right atrial pressure of 3 mmHg. 6. The aortic valve has been repaired/replaced. Aortic valve regurgitation is not visualized. There is a 29 mm Medtronic Evolut valve present in the aortic position. Vmax 2.7 m/s, MG , EOA 1.7 cm^2, DI 0.45  FINDINGS Left Ventricle: Left ventricular ejection fraction, by estimation, is 65 to 70%. Left ventricular ejection fraction by 3D volume is 73 %. The left ventricle has normal function. The left ventricle has no regional wall motion abnormalities. The average left ventricular global longitudinal strain is -23.5 %. Strain was performed and the global longitudinal strain is normal. The left ventricular internal cavity size was normal in size. There is mild left ventricular hypertrophy. Left ventricular diastolic parameters were normal.  Right Ventricle: The right ventricular size is normal. No increase in right ventricular wall thickness. Right ventricular systolic function is normal. There is normal pulmonary artery systolic pressure. The tricuspid regurgitant velocity is 2.52 m/s, and with an assumed right atrial pressure of 3 mmHg, the estimated right ventricular systolic pressure is 28.4 mmHg.  Left [facility]: Left atrial size was normal in size.  Right [facility]: Right atrial size was mildly dilated.  Pericardium: There is no evidence of pericardial effusion.  Mitral Valve: The mitral valve is degenerative in appearance. Moderate mitral annular calcification. Trivial mitral valve regurgitation. No evidence of mitral valve stenosis.  Tricuspid Valve: The tricuspid valve is normal in structure. Tricuspid valve regurgitation is trivial.  Aortic Valve: The aortic valve has been repaired/replaced. Aortic valve regurgitation is not  visualized. Aortic valve mean gradient measures 14.7 mmHg. Aortic valve peak gradient measures 26.2 mmHg. Aortic valve area, by VTI measures 1.75 cm. There is a 29 mm Medtronic Evolut valve present in the aortic position.  Pulmonic Valve: The pulmonic valve was grossly normal. Pulmonic valve regurgitation is trivial.  Aorta: The aortic root and ascending aorta are structurally normal, with no evidence of dilitation.  Venous: The inferior vena cava is normal in size with greater than 50% respiratory variability, suggesting right atrial pressure of 3 mmHg.  IAS/Shunts: The interatrial septum was not well visualized.  Additional Comments: 3D was performed not requiring image post processing on an independent workstation and was normal.   LEFT VENTRICLE PLAX 2D LVIDd:         4.50 cm         Diastology LVIDs:         1.90 cm         LV e' medial:    9.14 cm/s LV PW:         1.00 cm         LV E/e' medial:  11.1 LV IVS:        0.90 cm  LV e' lateral:   11.00 cm/s LVOT diam:     2.20 cm         LV E/e' lateral: 9.2 LV SV:         102 LV SV Index:   57              2D Longitudinal LVOT Area:     3.80 cm        Strain LV IVRT:       58 msec         2D Strain GLS   -29.3 % (A4C): 2D Strain GLS   -20.3 % (A3C): 2D Strain GLS   -20.9 % (A2C): 2D Strain GLS   -23.5 % Avg:  3D Volume EF LV 3D EF:    Left ventricul ar ejection fraction by 3D volume is 73 %.  3D Volume EF: 3D EF:        73 % LV EDV:       120 ml LV ESV:       33 ml LV SV:        87 ml  RIGHT VENTRICLE RV Basal diam:  4.10 cm     PULMONARY VEINS RV Mid diam:    4.20 cm     Diastolic Velocity: 36.90 cm/s RV S prime:     11.70 cm/s  S/D Velocity:       1.50 TAPSE (M-mode): 2.5 cm      Systolic Velocity:  55.60 cm/s RVSP:           28.4 mmHg  LEFT ATRIUM           Index        RIGHT ATRIUM           Index LA diam:      4.10 cm 2.30 cm/m   RA Pressure: 3.00 mmHg LA Vol (A2C): 54.6 ml 30.58 ml/m  RA  Area:     20.90 cm LA Vol (A4C): 88.6 ml 49.62 ml/m  RA Volume:   59.50 ml  33.32 ml/m AORTIC VALVE AV Area (Vmax):    1.77 cm AV Area (Vmean):   1.68 cm AV Area (VTI):     1.75 cm AV Vmax:           256.00 cm/s AV Vmean:          179.000 cm/s AV VTI:            0.585 m AV Peak Grad:      26.2 mmHg AV Mean Grad:      14.7 mmHg LVOT Vmax:         119.50 cm/s LVOT Vmean:        79.100 cm/s LVOT VTI:          0.269 m LVOT/AV VTI ratio: 0.46  AORTA Ao Root diam: 3.50 cm Ao Asc diam:  3.10 cm  MITRAL VALVE                TRICUSPID VALVE MV Area (PHT)  cm          TR Peak grad:   25.4 mmHg MV Decel Time: 280 msec     TR Vmax:        252.00 cm/s MV E velocity: 101.00 cm/s  Estimated RAP:  3.00 mmHg MV A velocity: 93.80 cm/s   RVSP:           28.4 mmHg MV E/A ratio:  1.08 SHUNTS Systemic VTI:  0.27 m Systemic Diam:  2.20 cm  Lonni Nanas MD Electronically signed by Lonni Nanas MD Signature Date/Time: 06/16/2024/3:24:07 PM    Final (Updated)      CT SCANS  CT CORONARY MORPH W/CTA COR W/SCORE 04/05/2024  Addendum 04/05/2024  5:21 PM ADDENDUM REPORT: 04/05/2024 17:18  ADDENDUM: The following report is an over-read performed by radiologist Dr. Reyes Holder of [facility] on 04/05/2024. This over-read does not include interpretation of cardiac or coronary anatomy or pathology. The coronary calcium  score/coronary CTA interpretation by the cardiologist is attached.  COMPARISON:  None.  FINDINGS: Vascular: Aortic atherosclerosis. Calcifications of the aortic valve and annulus. Three-vessel coronary artery calcifications. Mild four-chamber cardiac enlargement.  Mediastinum/Nodes: Prominent mediastinal lymph nodes for instance a 7 mm subcarinal lymph node on image 19/306.  Lungs/Pleura: Scattered tiny pulmonary nodules for instance a 3 mm nodule in the right middle lobe on image 31/307. Scattered atelectasis/scarring.  Upper Abdomen:  Visualized portions of the upper abdomen are unremarkable.  Musculoskeletal: There are no aggressive appearing lytic or blastic lesions noted in the visualized portions of the skeleton.  IMPRESSION: 1. Scattered tiny pulmonary nodules measuring up to 3 mm. 2. Prominent mediastinal lymph nodes, nonspecific but likely reactive. 3. Aortic atherosclerosis.  Aortic Atherosclerosis (ICD10-I70.0).   Electronically Signed By: Reyes Holder M.D. On: 04/05/2024 17:18  Narrative CLINICAL DATA:  Aortic valve replacement (TAVR), pre-op eval  EXAM: Cardiac TAVR CT  TECHNIQUE: A non-contrast, gated CT scan was obtained with axial slices of 2.5 mm through the heart for aortic valve scoring. A 120 kV retrospective, gated, contrast cardiac scan was obtained. Gantry rotation speed was 230 msec and collimation was 0.63 mm. Nitroglycerin  was not given. A delayed scan was obtained to exclude left atrial appendage thrombus. The 3D dataset was reconstructed in systole with motion correction. The 3D data set was reconstructed in 5% intervals of the 0-95% of the R-R cycle. Systolic and diastolic phases were analyzed on a dedicated workstation using MPR, MIP, and VRT modes. The patient received 100 cc of contrast.  FINDINGS: Poor quality contrast bolus due to IV access.  Aortic Valve:  Tricuspid aortic valve with severely reduced cusp excursion. Severely thickened and severely calcified aortic valve cusps.  AV calcium  score: 1113  Virtual Basal Annulus Measurements:  Maximum/Minimum Diameter: 24.8 x 20.6 mm  Perimeter: 72.4 mm  Area:  405 mm2  Trivial LVOT calcifications.  Membranous septal length: 8 mm  Based on these measurements, the annulus would be suitable for a 23 mm Sapien 3 valve. Alternatively, Heart Team can consider 26 mm Evolut valve (perimeter is borderline for 26 mm vs 29 mm, and sinus size may be more suitable for a 26 mm Evolut valve). Recommend Heart Team  discussion for valve selection.  Sinus of Valsalva Measurements:  Non-coronary:  29 mm  Right - coronary:  27 mm  Left - coronary:  27 mm  Coronary height and sinus of Valsalva Height:  Left main: 15 mm, Left sinus: 18 mm  Right coronary: 17 mm, Right sinus: 21 mm  Aorta: Conventional 3 vessel branch pattern of aortic arch.  Sinotubular Junction:  24 mm  Ascending Thoracic Aorta:  30 mm  Aortic Arch:  23 mm  Descending Thoracic Aorta:  23 mm  Coronary Arteries: Normal coronary origin. Right dominance. The study was performed without use of NTG and insufficient for plaque evaluation. Coronary artery calcium  seen in 3 vessel distribution.  Optimum Fluoroscopic Angle for Delivery: LAO 3 CAU 3  OTHER:  Atria: Left atrial  dilation  Left atrial appendage: No thrombus.  Mitral valve: Grossly normal, mild mitral annular calcifications.  Pulmonary artery: Moderately dilated, 36 mm, suggestive of elevated pulmonary pressures.  Pulmonary veins: Normal anatomy.  IMPRESSION: 1. Tricuspid aortic valve with severely reduced cusp excursion. Severely thickened and severely calcified aortic valve cusps. 2. Aortic valve calcium  score: 1113 3. Annulus area: 405 mm2, suitable for 23 mm Sapien 3 valve. Trivial LVOT calcifications. Membranous septal length 8 mm. 4. Sufficient coronary artery heights from annulus. 5. Optimum fluoroscopic angle for delivery: LAO 3 CAU 3  Electronically Signed: By: Soyla Merck M.D. On: 04/05/2024 17:13     ______________________________________________________________________________________________      05/25/2024: Magnesium  1.9; TSH 7.826 08/30/2024: ALT <5; BUN 25; Creatinine 4.81; Hemoglobin 11.9; Platelets 115; Potassium 4.5; Sodium 142       ASSESSMENT AND PLAN:   1. S/P TAVR (transcatheter aortic valve replacement)   2. ESRD (end stage renal disease) (HCC)   3. Factor 5 Leiden mutation, heterozygous   4. PAF (paroxysmal  atrial fibrillation) (HCC)   5. Typical atrial flutter (HCC)   6. Hypercoagulable state due to paroxysmal atrial fibrillation (HCC)   7. Hyperlipidemia LDL goal <70   8. Aortic atherosclerosis      Status post TAVR: 30-day echocardiogram demonstrated a mean gradient of 16 mmHg and no PVL.  Ejection fraction was normal.  Continue Eliquis  5 mg twice daily.  She is doing quite well.  Follow-up in 1 year. End-stage renal disease: Patient is currently on dialysis managed by other providers. Factor V Leyden mutation: Continue Eliquis  5 mg twice daily PAF: Continue Eliquis  5 mg twice daily, amiodarone  200 mg twice daily.  Considering ablation with Dr. Kennyth. Atrial flutter: Continue Eliquis  5 mg twice daily.  Considering ablation with Dr. Kennyth. Hypercoagulable state: Continue Eliquis  5 mg twice daily Hyperlipidemia: LDL recently was 90.  She has not been taking her atorvastatin  20 mg on a regular basis.  She will start taking this regularly and have her lipids checked and we will transmit this to us  in a few months. Aortic atherosclerosis: Continue Eliquis  5 mg twice daily, restart atorvastatin  20 mg a regular basis.    I have personally reviewed the patients imaging data as summarized above.  I have reviewed the natural history of aortic stenosis with the patient and family members who are present today. We have discussed the limitations of medical therapy and the poor prognosis associated with symptomatic aortic stenosis. We have also reviewed potential treatment options, including palliative medical therapy, conventional surgical aortic valve replacement, and transcatheter aortic valve replacement. We discussed treatment options in the context of this patient's specific comorbid medical conditions.   All of the patient's questions were answered today. Will make further recommendations based on the results of studies outlined above.   I spent 38 minutes reviewing all clinical data during and  prior to this visit including all relevant imaging studies, laboratories, clinical information from other health systems and prior notes from both Cardiology and other specialties, interviewing the patient, conducting a complete physical examination, and coordinating care in order to formulate a comprehensive and personalized evaluation and treatment plan.   Aashritha Miedema K Keeshawn Fakhouri, MD  09/01/2024 11:20 AM    [facility] [address] [place], [place], [place]  [address] Phone: [phone]; Fax: [phone]

## 2024-08-30 ENCOUNTER — Ambulatory Visit: Admitting: Medical Oncology

## 2024-08-30 ENCOUNTER — Inpatient Hospital Stay: Attending: Medical Oncology

## 2024-08-30 VITALS — BP 104/63 | HR 66 | Temp 98.0°F | Resp 19 | Ht 60.0 in | Wt 184.0 lb

## 2024-08-30 DIAGNOSIS — Z7901 Long term (current) use of anticoagulants: Secondary | ICD-10-CM | POA: Diagnosis not present

## 2024-08-30 DIAGNOSIS — D6851 Activated protein C resistance: Secondary | ICD-10-CM

## 2024-08-30 DIAGNOSIS — D509 Iron deficiency anemia, unspecified: Secondary | ICD-10-CM | POA: Insufficient documentation

## 2024-08-30 DIAGNOSIS — D631 Anemia in chronic kidney disease: Secondary | ICD-10-CM | POA: Diagnosis present

## 2024-08-30 DIAGNOSIS — Z992 Dependence on renal dialysis: Secondary | ICD-10-CM

## 2024-08-30 DIAGNOSIS — D649 Anemia, unspecified: Secondary | ICD-10-CM

## 2024-08-30 DIAGNOSIS — N186 End stage renal disease: Secondary | ICD-10-CM | POA: Diagnosis present

## 2024-08-30 LAB — RETIC PANEL
Immature Retic Fract: 12.4 % (ref 2.3–15.9)
RBC.: 3.98 MIL/uL (ref 3.87–5.11)
Retic Count, Absolute: 119.4 K/uL (ref 19.0–186.0)
Retic Ct Pct: 3 % (ref 0.4–3.1)
Reticulocyte Hemoglobin: 30.8 pg (ref >27.9)

## 2024-08-30 LAB — CMP (CANCER CENTER ONLY)
ALT: <5 U/L (ref 0–44)
AST: 18 U/L (ref 15–41)
Albumin: 4.1 g/dL (ref 3.5–5.0)
Alkaline Phosphatase: 85 U/L (ref 38–126)
Anion gap: 21 — ABNORMAL HIGH (ref 5–15)
BUN: 25 mg/dL — ABNORMAL HIGH (ref 8–23)
CO2: 24 mmol/L (ref 22–32)
Calcium: 8.6 mg/dL — ABNORMAL LOW (ref 8.9–10.3)
Chloride: 97 mmol/L — ABNORMAL LOW (ref 98–111)
Creatinine: 4.81 mg/dL — ABNORMAL HIGH (ref 0.44–1.00)
GFR, Estimated: 9 mL/min — ABNORMAL LOW (ref >60)
Glucose, Bld: 136 mg/dL — ABNORMAL HIGH (ref 70–99)
Potassium: 4.5 mmol/L (ref 3.5–5.1)
Sodium: 142 mmol/L (ref 135–145)
Total Bilirubin: 0.4 mg/dL (ref 0.0–1.2)
Total Protein: 7.2 g/dL (ref 6.5–8.1)

## 2024-08-30 LAB — CBC
HCT: 38.1 % (ref 36.0–46.0)
Hemoglobin: 11.9 g/dL — ABNORMAL LOW (ref 12.0–15.0)
MCH: 29.5 pg (ref 26.0–34.0)
MCHC: 31.2 g/dL (ref 30.0–36.0)
MCV: 94.3 fL (ref 80.0–100.0)
Platelets: 115 K/uL — ABNORMAL LOW (ref 150–400)
RBC: 4.04 MIL/uL (ref 3.87–5.11)
RDW: 16.1 % — ABNORMAL HIGH (ref 11.5–15.5)
WBC: 6 K/uL (ref 4.0–10.5)
nRBC: 0 % (ref 0.0–0.2)

## 2024-08-30 LAB — IRON AND IRON BINDING CAPACITY (CC-WL,HP ONLY)
Iron: 47 ug/dL (ref 28–170)
Saturation Ratios: 28 % (ref 10.4–31.8)
TIBC: 169 ug/dL — ABNORMAL LOW (ref 250–450)
UIBC: 122 ug/dL

## 2024-08-30 LAB — FERRITIN: Ferritin: 1308 ng/mL — ABNORMAL HIGH (ref 11–307)

## 2024-08-30 NOTE — Progress Notes (Signed)
 Hematology and Oncology Follow Up Visit  Whitney Chavez [id number] 10/07/1955 68 y.o. 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  She states that she has been doing well and has no complaints at this time.   Since we last saw her she did have aortic value replacement surgery on August 1st, 2025. She is feeling better overall with more energy.   She gets EPO at her nephrology office who also manages her dialysis.   No blood loss noted. No abnormal bruising, no petechiae. Appetite comes and goes. She has occasional episodes of n/v and states that her work up with GI so far has negative.  She does her best to hydrate properly on fluid restrictions.  No fever, chills, cough, rash, dizziness, SOB, chest pain or changes in bowel habits  No numbness or tingling in her extremities at this time.  No falls or syncope reported.   Wt Readings from Last 3 Encounters:  08/30/24 184 lb (83.5 kg)  06/16/24 178 lb 12.8 oz (81.1 kg)  06/07/24 180 lb 3.2 oz (81.7 kg)    ECOG Performance Status: 1 - Symptomatic but completely ambulatory  Medications:  Allergies as of 08/30/2024       Reactions   Estrogens Other (See Comments)   Blood clots   Other Other (See Comments)   Unable to take birth control pills due to clotting disorder/fim   Prednisone Other (See Comments)   Facial edema - has had this 2020 and had no difficulty can take in small doses   Progesterone Other (See Comments)   Blood clots   Propofol  Other (See Comments)   Became disinhibited and exacerbated restless leg syndrome during EGD/colonoscopy. Patient has requested an alternate form of sedation during future procedures. Legs  started thrashing and skin tears.   Lisinopril Cough   Clotrimazole Rash   Tape Rash, Other (See Comments)   Please use paper tape        Medication List        Accurate as of August 30, 2024 10:48 AM. If you have any questions, ask your nurse or doctor.          rOPINIRole  0.5 MG tablet Commonly known as: REQUIP  TAKE 1-2 TABLETS BY MOUTH IN THE MORNING. What changed:  how much to take how to take this when to take this additional instructions The timing of this medication is very important.   rOPINIRole  2 MG tablet Commonly known as: REQUIP  Take 1 tablet (2 mg total) by mouth at bedtime. What changed: Another medication with the same name was changed. Make sure you understand how and when to take each. The timing of this medication is very important.   acetaminophen  500 MG tablet Commonly known as: TYLENOL  Take 500 mg by mouth every 6 (six) hours as needed for moderate pain.   Allergy Eye 0.025-0.3 % ophthalmic solution Generic drug: naphazoline-pheniramine Place 1-2 drops into both eyes 4 (four) times daily as needed for eye irritation or allergies.   amiodarone  200 MG tablet Commonly known as: PACERONE  Take 1 tablet (200 mg total) by mouth 2 (two) times daily.   amoxicillin  500 MG capsule Commonly known as: AMOXIL  Take 4 capsules by  mouth as directed. 1 hour prior to dental work including cleanings   apixaban  5 MG Tabs tablet Commonly known as: Eliquis  Take 1 tablet (5 mg total) by mouth 2 (two) times daily.   atorvastatin  20 MG tablet Commonly known as: LIPITOR Take 20 mg by mouth every evening.   budesonide -formoterol  160-4.5 MCG/ACT inhaler Commonly known as: Symbicort  Inhale 2 puffs into the lungs in the morning and at bedtime.   cetirizine 10 MG tablet Commonly known as: ZYRTEC Take 10 mg by mouth daily in the afternoon.   Epogen 2000 UNIT/ML injection Generic drug: epoetin alfa 2,000 Units 3 (three) times a week.   fluocinonide  ointment 0.05 % Commonly known as: LIDEX Apply 1 Application topically 2 (two) times daily as needed (psoriasis).   gabapentin  100 MG capsule Commonly known as: NEURONTIN  Take 1-2 capsules (100-200 mg total) by mouth 2 (two) times daily. In the afternoon and in the evening   Incruse Ellipta  62.5 MCG/ACT Aepb Generic drug: umeclidinium bromide  Inhale 1 puff into the lungs daily.   lanthanum  500 MG chewable tablet Commonly known as: FOSRENOL  Chew 500-1,000 mg by mouth See admin instructions. Take 1-2 tablets [phone] mg) by mouth with each meal & take 1 tablet (500 mg) with snacks.   Medical Compression Stockings Misc by Does not apply route. Leg wraps.   multivitamin Tabs tablet Take 1 tablet by mouth daily in the afternoon.   neomycin-bacitracin-polymyxin Oint Commonly known as: NEOSPORIN Apply 1 Application topically as needed for wound care.   omeprazole 20 MG capsule Commonly known as: PRILOSEC Take 20 mg by mouth daily as needed (indigestion/heartburn.).   ondansetron  4 MG disintegrating tablet Commonly known as: ZOFRAN -ODT Take 4 mg by mouth every Monday, Wednesday, and Friday.   oxyCODONE  5 MG immediate release tablet Commonly known as: Roxicodone  Take one po qd for more severe pain   PARSABIV IV Inject 1 Dose into the vein 3 (three) times a week. Receives at Dialysis   PRESCRIPTION MEDICATION Inhale into the lungs at bedtime. CPAP with nasal pillow   SODIUM THIOSULFATE IV Inject 1 Dose into the vein 2 (two) times a week. Receives at Dialysis   sulfaSALAzine  500 MG tablet Commonly known as: AZULFIDINE  Take 1,000 mg by mouth 2 (two) times daily.   traMADol  50 MG tablet Commonly known as: ULTRAM  TAKE 1 TABLET (50 MG TOTAL) BY MOUTH 2 (TWO) TIMES DAILY AS NEEDED (PAIN).   triamcinolone cream 0.1 % Commonly known as: KENALOG Apply 1 Application topically daily as needed (itching).        Allergies:  Allergies  Allergen Reactions   Estrogens Other  (See Comments)    Blood clots   Other Other (See Comments)    Unable to take birth control pills due to clotting disorder/fim   Prednisone Other (See Comments)    Facial edema - has had this 2020 and had no difficulty can take in small doses   Progesterone Other (See Comments)    Blood clots   Propofol  Other (See Comments)    Became disinhibited and exacerbated restless leg syndrome during EGD/colonoscopy. Patient has requested an alternate form of sedation during future procedures.  Legs started thrashing and skin tears.   Lisinopril Cough   Clotrimazole Rash   Tape Rash and Other (See Comments)    Please use paper tape    Past Medical History, Surgical history, Social history, and Family History were reviewed and updated.  Review of Systems: All other 10 point review of systems is  negative.   Physical Exam:  height is 5' (1.524 m) and weight is 184 lb (83.5 kg). Her oral temperature is 98 F (36.7 C). Her blood pressure is 104/63 and her pulse is 66. Her respiration is 19 and oxygen saturation is 100%.   Wt Readings from Last 3 Encounters:  08/30/24 184 lb (83.5 kg)  06/16/24 178 lb 12.8 oz (81.1 kg)  06/07/24 180 lb 3.2 oz (81.7 kg)   Constitutional: Using a rolling walker for gentle assistance  Ocular: Sclerae unicteric, pupils equal, round and reactive to light Ear-nose-throat: Oropharynx clear, dentition fair Lymphatic: No cervical or supraclavicular adenopathy Lungs no rales or rhonchi, good excursion bilaterally Heart regular rate and rhythm, no murmur appreciated Abd soft MSK no focal spinal tenderness, no joint edema Neuro: non-focal, well-oriented, appropriate affect  Lab Results  Component Value Date   WBC 6.0 08/30/2024   HGB 11.9 (L) 08/30/2024   HCT 38.1 08/30/2024   MCV 94.3 08/30/2024   PLT 115 (L) 08/30/2024   Lab Results  Component Value Date   FERRITIN 1,235 (H) 03/01/2024   IRON 54 03/01/2024   TIBC 153 (L) 03/01/2024   UIBC 99 (L)  03/01/2024   IRONPCTSAT 35 (H) 03/01/2024   Lab Results  Component Value Date   RETICCTPCT 3.0 08/30/2024   RBC 3.98 08/30/2024   RBC 4.04 08/30/2024   No results found for: KPAFRELGTCHN, LAMBDASER, KAPLAMBRATIO No results found for: IGGSERUM, IGA, IGMSERUM No results found for: STEPHANY CARLOTA BENSON MARKEL EARLA JOANNIE DOC VICK, SPEI   Chemistry      Component Value Date/Time   NA 135 05/25/2024 1205   NA 144 06/25/2017 1146   K 3.7 05/25/2024 1205   K 4.1 06/25/2017 1146   CL 98 05/25/2024 1205   CL 103 06/25/2017 1146   CO2 23 05/25/2024 1205   CO2 31 06/25/2017 1146   BUN 18 05/25/2024 1205   BUN 27 (H) 06/25/2017 1146   CREATININE 3.31 (H) 05/25/2024 1525   CREATININE 5.82 (H) 03/01/2024 1047   CREATININE 4.7 (HH) 06/25/2017 1146      Component Value Date/Time   CALCIUM  9.3 05/25/2024 1205   CALCIUM  9.7 06/25/2017 1146   ALKPHOS 63 05/25/2024 1205   ALKPHOS 60 06/25/2017 1146   AST 22 05/25/2024 1205   AST 12 (L) 03/01/2024 1047   ALT 7 05/25/2024 1205   ALT 11 03/01/2024 1047   ALT 14 06/25/2017 1146   BILITOT 1.0 05/25/2024 1205   BILITOT 0.6 03/01/2024 1047     Encounter Diagnoses  Name Primary?   Factor 5 Leiden mutation, heterozygous Yes   Long term current use of anticoagulant    ESRD (end stage renal disease) on dialysis (HCC)    Iron deficiency anemia, unspecified iron deficiency anemia type    Impression and Plan: Ms. Halvorson is a very pleasant 68 yo caucasian female with history of recurrent thrombotic events. She has both Factor V Leiden as well as the prothrombin II gene mutation. She has a history of anemia  She is doing well on Eliquis  without bleeding episodes or side effects. In terms of perioperative clearance, she is considered high risk for VTE. For low to moderate risk procedure we would ask that she hold her Eliquis  2 days prior to her surgery and restart it 24 hours after her surgery. Given her  dialysis regimen, she typically has held her Eliquis  5 days prior to surgery and then transition to heparin  as a bridge. She has done well  with this in the past and would prefer to do this method instead. She is comfortable administering and has needles/syringes at home. She will Inject 1 mL (5,000 Units total) into the skin 2 (two) times daily. To be given subcutaneously for 3 days prior to surgery. She will then restart her Eliquis  24 hours after surgery or as advised by her cardiothoracic surgeon. Reviewed with Dr. Timmy   Today her CBC shows a Hgb of 11.9, platelets of 115- both improved from previous.  She will continue her same regimen and will continue her EPO  Disposition RTC 6 months APP, labs ( CBC w/, CMP)-[place]   Lauraine CHRISTELLA Dais, PA-C 12/16/202510:48 AM

## 2024-09-01 ENCOUNTER — Encounter: Payer: Self-pay | Admitting: Internal Medicine

## 2024-09-01 ENCOUNTER — Ambulatory Visit: Admitting: Internal Medicine

## 2024-09-01 ENCOUNTER — Ambulatory Visit: Payer: Self-pay | Admitting: Medical Oncology

## 2024-09-01 VITALS — BP 112/70 | HR 69 | Ht 60.0 in | Wt 183.0 lb

## 2024-09-01 DIAGNOSIS — D6851 Activated protein C resistance: Secondary | ICD-10-CM | POA: Insufficient documentation

## 2024-09-01 DIAGNOSIS — N186 End stage renal disease: Secondary | ICD-10-CM | POA: Diagnosis present

## 2024-09-01 DIAGNOSIS — D6869 Other thrombophilia: Secondary | ICD-10-CM | POA: Diagnosis present

## 2024-09-01 DIAGNOSIS — I700 Atherosclerosis of aorta: Secondary | ICD-10-CM | POA: Insufficient documentation

## 2024-09-01 DIAGNOSIS — I483 Typical atrial flutter: Secondary | ICD-10-CM | POA: Insufficient documentation

## 2024-09-01 DIAGNOSIS — I480 Paroxysmal atrial fibrillation: Secondary | ICD-10-CM | POA: Diagnosis present

## 2024-09-01 DIAGNOSIS — Z952 Presence of prosthetic heart valve: Secondary | ICD-10-CM | POA: Insufficient documentation

## 2024-09-01 DIAGNOSIS — E785 Hyperlipidemia, unspecified: Secondary | ICD-10-CM | POA: Insufficient documentation

## 2024-09-01 NOTE — Patient Instructions (Signed)
 Medication Instructions:  No medication changes were made at this visit. Continue current regimen.   *If you need a refill on your cardiac medications before your next appointment, please call your pharmacy*  Lab Work: None ordered today. If you have labs (blood work) drawn today and your tests are completely normal, you will receive your results only by: MyChart Message (if you have MyChart) OR A paper copy in the mail If you have any lab test that is abnormal or we need to change your treatment, we will call you to review the results.  Testing/Procedures: None ordered today.  Follow-Up: At [facility], you and your health needs are our priority.  As part of our continuing mission to provide you with exceptional heart care, our providers are all part of one team.  This team includes your primary Cardiologist (physician) and Advanced Practice Providers or APPs (Physician Assistants and Nurse Practitioners) who all work together to provide you with the care you need, when you need it.  Your next appointment:   1 year(s)  Provider:   Lurena Red, MD

## 2024-09-03 LAB — FOLATE: Folate: >20 ng/mL

## 2024-09-07 ENCOUNTER — Other Ambulatory Visit (HOSPITAL_COMMUNITY): Payer: Self-pay

## 2024-09-07 MED ORDER — HEPARIN SODIUM (PORCINE) 5000 UNIT/ML IJ SOLN
5000.0000 [IU] | Freq: Two times a day (BID) | INTRAMUSCULAR | 0 refills | Status: AC
  Filled 2024-09-07: qty 6, 3d supply, fill #0

## 2024-09-26 NOTE — Progress Notes (Unsigned)
 Whitney Chavez

## 2024-09-27 ENCOUNTER — Ambulatory Visit: Admitting: Neurology

## 2024-09-27 ENCOUNTER — Encounter: Payer: Self-pay | Admitting: Neurology

## 2024-09-27 VITALS — Ht 60.0 in | Wt 180.5 lb

## 2024-09-27 DIAGNOSIS — G629 Polyneuropathy, unspecified: Secondary | ICD-10-CM | POA: Diagnosis not present

## 2024-09-27 DIAGNOSIS — Z992 Dependence on renal dialysis: Secondary | ICD-10-CM

## 2024-09-27 DIAGNOSIS — G2581 Restless legs syndrome: Secondary | ICD-10-CM | POA: Diagnosis not present

## 2024-09-27 DIAGNOSIS — G4733 Obstructive sleep apnea (adult) (pediatric): Secondary | ICD-10-CM | POA: Diagnosis not present

## 2024-09-27 DIAGNOSIS — R269 Unspecified abnormalities of gait and mobility: Secondary | ICD-10-CM

## 2024-09-27 DIAGNOSIS — Z9989 Dependence on other enabling machines and devices: Secondary | ICD-10-CM | POA: Insufficient documentation

## 2024-09-27 DIAGNOSIS — J4530 Mild persistent asthma, uncomplicated: Secondary | ICD-10-CM | POA: Insufficient documentation

## 2024-09-27 DIAGNOSIS — I359 Nonrheumatic aortic valve disorder, unspecified: Secondary | ICD-10-CM | POA: Insufficient documentation

## 2024-09-27 DIAGNOSIS — N186 End stage renal disease: Secondary | ICD-10-CM

## 2024-09-27 DIAGNOSIS — D692 Other nonthrombocytopenic purpura: Secondary | ICD-10-CM | POA: Insufficient documentation

## 2024-09-27 MED ORDER — OXYCODONE HCL 5 MG PO TABS: ORAL_TABLET | ORAL | 0 refills | Status: AC

## 2024-09-27 MED ORDER — GABAPENTIN 100 MG PO CAPS: 100.0000 mg | ORAL_CAPSULE | Freq: Two times a day (BID) | ORAL | 3 refills | Status: AC

## 2024-09-27 MED ORDER — ROPINIROLE HCL 2 MG PO TABS: 2.0000 mg | ORAL_TABLET | Freq: Every day | ORAL | 3 refills | Status: AC

## 2024-09-27 MED ORDER — ROPINIROLE HCL 0.5 MG PO TABS: 0.5000 mg | ORAL_TABLET | Freq: Every day | ORAL | 3 refills | Status: AC

## 2024-09-27 MED ORDER — TRAMADOL HCL 50 MG PO TABS: 50.0000 mg | ORAL_TABLET | Freq: Two times a day (BID) | ORAL | 5 refills | Status: AC | PRN

## 2024-09-27 NOTE — Progress Notes (Signed)
 "  [facility]  PATIENT: Whitney Chavez DOB: 02-29-1956  REFERRING DOCTOR OR PCP:  none SOURCE: patient and records from [facility]  _________________________________   HISTORICAL  CHIEF COMPLAINT:  Chief Complaint  Patient presents with   RM11/POLYNEUROPATHY    Pt is here Alone. Pt states she has her RLS and Neuropathy has been bothering her especially during dialysis.      HISTORY OF PRESENT ILLNESS:  Whitney Chavez is a 69 year old woman with Obstructive sleep apnea , insomnia, restless leg syndrome and polyneuropathy.      Update 09/27/2024 She has OSA and is using CPAP nightly and can' sleep well without it.   Download shows compliance was 47% for 4 hours though 100% for nightly use. Efficacy was ok (AHI = 5.6/h).    She uses the [employer] mask (nasal pillows).     RLS limits the CPAP use due to discomfort - more than it did last year.    She reports more RLS symptoms, especially during HD and at night.  Some nights are much worse than others (esp post-s.   She takes ropinirole  2 mg every night and 1/2 mg at 230 pm and  additional one before HD and also takes gabapentin  100 mg po qpm and 200 mg po qHS.    Tramadol  50 mg po bid has helped.     Neuropathy pain has done better with the gabapentin  and tramadol .  Gait is off balance though she can walk without her rollator she denies any significant worsening of the numbness or the balance issues  She has mild EDS.  EPWORTH SLEEPINESS SCALE  On a scale of 0 - 3 what is the chance of dozing:  Sitting and Reading:   3 Watching TV:    1 Sitting inactive in a public place: 1 Passenger in car for one hour: 1 Lying down to rest in the afternoon: 3 Sitting and talking to someone: 0 Sitting quietly after lunch:  0 In a car, stopped in traffic:  0  Total (out of 24):  9/24   borderline EDS (generally worse on HD days and better on non-HD days)     Other medical history: She had calciphylaxis due to  HD (x 11+ years)  and required surgery .    This led to CRF/HD.    She had a TAVR in late 2025.   She has ESRD and is on a transplant list.     She may be moving back to [place]  near [place].      REVIEW OF SYSTEMS: Constitutional: No fevers, chills, sweats, or change in appetite Eyes: No visual changes, double vision, eye pain Ear, nose and throat: No hearing loss, ear pain, nasal congestion, sore throat Cardiovascular: No chest pain, palpitations Respiratory:  No shortness of breath at rest or with exertion.   No wheezes GastrointestinaI: She has had vomiting, not always associated with dialysis though usually worse on those days Genitourinary:  She is on hemodialysis. Musculoskeletal:  No neck pain, back pain Integumentary: No rash, pruritus, skin lesions Neurological: as above Psychiatric: No depression at this time.  No anxiety Endocrine: No palpitations, diaphoresis, change in appetite, change in weigh or increased thirst Hematologic/Lymphatic:  No anemia, purpura, petechiae. Allergic/Immunologic: No itchy/runny eyes, nasal congestion, recent allergic reactions, rashes  ALLERGIES: Allergies  Allergen Reactions   Estrogens Other (See Comments)    Blood clots   Other Other (See Comments)    Unable to take birth control pills due  to clotting disorder/fim   Prednisone Other (See Comments)    Facial edema - has had this 2020 and had no difficulty can take in small doses   Progesterone Other (See Comments)    Blood clots   Propofol  Other (See Comments)    Became disinhibited and exacerbated restless leg syndrome during EGD/colonoscopy. Patient has requested an alternate form of sedation during future procedures.  Legs started thrashing and skin tears.   Lisinopril Cough   Clotrimazole Rash   Tape Rash and Other (See Comments)    Please use paper tape    HOME MEDICATIONS:  Current Outpatient Medications:    acetaminophen  (TYLENOL ) 500 MG tablet, Take 500 mg by mouth  every 6 (six) hours as needed for moderate pain., Disp: , Rfl:    amiodarone  (PACERONE ) 200 MG tablet, Take 1 tablet (200 mg total) by mouth 2 (two) times daily., Disp: 60 tablet, Rfl: 0   apixaban  (ELIQUIS ) 5 MG TABS tablet, Take 1 tablet (5 mg total) by mouth 2 (two) times daily., Disp: 60 tablet, Rfl: 0   atorvastatin  (LIPITOR) 20 MG tablet, Take 20 mg by mouth every evening., Disp: , Rfl:    budesonide -formoterol  (SYMBICORT ) 160-4.5 MCG/ACT inhaler, Inhale 2 puffs into the lungs in the morning and at bedtime., Disp: 30.6 g, Rfl: 3   cetirizine (ZYRTEC) 10 MG tablet, Take 10 mg by mouth daily in the afternoon., Disp: , Rfl:    Elastic Bandages & Supports (MEDICAL COMPRESSION STOCKINGS) MISC, by Does not apply route. Leg wraps., Disp: , Rfl:    epoetin alfa (EPOGEN) 2000 UNIT/ML injection, 2,000 Units 3 (three) times a week., Disp: , Rfl:    Etelcalcetide HCl (PARSABIV IV), Inject 1 Dose into the vein 3 (three) times a week. Receives at Dialysis, Disp: , Rfl:    fluocinonide ointment (LIDEX) 0.05 %, Apply 1 Application topically 2 (two) times daily as needed (psoriasis)., Disp: , Rfl:    heparin  5000 UNIT/ML injection, Inject 1 mL (5,000 Units total) into the skin 2 (two) times daily for 3 days starting Sunday 09/11/24., Disp: 6 mL, Rfl: 0   lanthanum  (FOSRENOL ) 500 MG chewable tablet, Chew 500-1,000 mg by mouth See admin instructions. Take 1-2 tablets ([phone] mg) by mouth with each meal & take 1 tablet (500 mg) with snacks., Disp: , Rfl:    multivitamin (RENA-VIT) TABS tablet, Take 1 tablet by mouth daily in the afternoon., Disp: , Rfl:    naphazoline-pheniramine (ALLERGY EYE) 0.025-0.3 % ophthalmic solution, Place 1-2 drops into both eyes 4 (four) times daily as needed for eye irritation or allergies., Disp: , Rfl:    neomycin-bacitracin-polymyxin (NEOSPORIN) OINT, Apply 1 Application topically as needed for wound care., Disp: , Rfl:    omeprazole (PRILOSEC) 20 MG capsule, Take 20 mg by mouth  daily as needed (indigestion/heartburn.)., Disp: , Rfl:    ondansetron  (ZOFRAN -ODT) 4 MG disintegrating tablet, Take 4 mg by mouth every Monday, Wednesday, and Friday., Disp: , Rfl:    PRESCRIPTION MEDICATION, Inhale into the lungs at bedtime. CPAP with nasal pillow, Disp: , Rfl:    SODIUM THIOSULFATE IV, Inject 1 Dose into the vein 2 (two) times a week. Receives at Dialysis, Disp: , Rfl:    sulfaSALAzine  (AZULFIDINE ) 500 MG tablet, Take 1,000 mg by mouth 2 (two) times daily., Disp: , Rfl:    triamcinolone cream (KENALOG) 0.1 %, Apply 1 Application topically daily as needed (itching)., Disp: , Rfl:    umeclidinium bromide  (INCRUSE ELLIPTA ) 62.5 MCG/ACT AEPB, Inhale 1  puff into the lungs daily., Disp: 90 each, Rfl: 3   amoxicillin  (AMOXIL ) 500 MG capsule, Take 4 capsules by mouth as directed. 1 hour prior to dental work including cleanings (Patient not taking: Reported on 09/27/2024), Disp: , Rfl:    gabapentin  (NEURONTIN ) 100 MG capsule, Take 1-2 capsules (100-200 mg total) by mouth 2 (two) times daily. In the afternoon and in the evening, Disp: 360 capsule, Rfl: 3   oxyCODONE  (ROXICODONE ) 5 MG immediate release tablet, Take one po qd for more severe pain, Disp: 30 tablet, Rfl: 0   rOPINIRole  (REQUIP ) 0.5 MG tablet, Take 1 tablet (0.5 mg total) by mouth daily in the afternoon., Disp: 90 tablet, Rfl: 3   rOPINIRole  (REQUIP ) 2 MG tablet, Take 1 tablet (2 mg total) by mouth at bedtime., Disp: 90 tablet, Rfl: 3   traMADol  (ULTRAM ) 50 MG tablet, Take 1 tablet (50 mg total) by mouth 2 (two) times daily as needed (pain)., Disp: 60 tablet, Rfl: 5  PAST MEDICAL HISTORY: Past Medical History:  Diagnosis Date   Asthma    Axillary vein thrombosis (HCC) 08/16/2015   Calciphylaxis    Chronic kidney disease    Colitis    COPD (chronic obstructive pulmonary disease) (HCC)    Factor V Leiden    Hypertension    Lymphedema    legs   MRSA (methicillin resistant staph aureus) culture positive    Neuropathy     Obstructive sleep apnea 03/29/2015   OSA on CPAP    S/P TAVR (transcatheter aortic valve replacement) 05/03/2024   s/p TAVR with a 29 mm Medtronic Evlout FX + THV via the TF approach by Dr. Wendel and lightfoot   Severe aortic stenosis    Vision abnormalities     PAST SURGICAL HISTORY: Past Surgical History:  Procedure Laterality Date   APPLICATION OF WOUND VAC     AV FISTULA PLACEMENT Left    BIOPSY  11/05/2022   Procedure: BIOPSY;  Surgeon: Burnette Fallow, MD;  Location: [facility];  Service: Gastroenterology;;   COLONOSCOPY WITH PROPOFOL  Bilateral 11/05/2022   Procedure: COLONOSCOPY WITH PROPOFOL ;  Surgeon: Burnette Fallow, MD;  Location: [facility];  Service: Gastroenterology;  Laterality: Bilateral;   CORONARY ANGIOGRAPHY N/A 03/29/2024   Procedure: CORONARY ANGIOGRAPHY;  Surgeon: Wendel Lurena POUR, MD;  Location: [facility];  Service: Cardiovascular;  Laterality: N/A;   ESOPHAGOGASTRODUODENOSCOPY (EGD) WITH PROPOFOL  Bilateral 03/19/2022   Procedure: ESOPHAGOGASTRODUODENOSCOPY (EGD) WITH PROPOFOL ;  Surgeon: Burnette Fallow, MD;  Location: [facility];  Service: Gastroenterology;  Laterality: Bilateral;   INTRAOPERATIVE TRANSTHORACIC ECHOCARDIOGRAM N/A 05/03/2024   Procedure: ECHOCARDIOGRAM, TRANSTHORACIC;  Surgeon: Wendel Lurena POUR, MD;  Location: [facility];  Service: Cardiovascular;  Laterality: N/A;   LOWER EXTREMITY ANGIOGRAPHY  03/29/2024   Procedure: Lower Extremity Angiography;  Surgeon: Wendel Lurena POUR, MD;  Location: [facility];  Service: Cardiovascular;;   lumbar decompression fusion     TONSILLECTOMY     UPPER EXTREMITY VENOGRAPHY  03/29/2024   Procedure: UPPER EXTREMITY VENOGRAPHY;  Surgeon: Wendel Lurena POUR, MD;  Location: [facility];  Service: Cardiovascular;;    FAMILY HISTORY: Family History  Problem Relation Age of Onset   Diabetes Mother    Congestive Heart Failure Mother    COPD Mother    Stroke Mother    Neuropathy Mother     Heart disease Father    Stroke Father    Cancer Brother    Diabetes Brother    Diabetes Brother  Alcohol abuse Brother     SOCIAL HISTORY:  Social History   Socioeconomic History   Marital status: Single    Spouse name: Not on file   Number of children: Not on file   Years of education: Not on file   Highest education level: Not on file  Occupational History   Not on file  Tobacco Use   Smoking status: Former    Current packs/day: 0.00    Average packs/day: 2.0 packs/day for 30.0 years (60.0 ttl pk-yrs)    Types: Cigarettes    Start date: 11/24/1968    Quit date: 11/25/1998    Years since quitting: 25.8   Smokeless tobacco: Never  Vaping Use   Vaping status: Never Used  Substance and Sexual Activity   Alcohol use: No    Alcohol/week: 0.0 standard drinks of alcohol   Drug use: No   Sexual activity: Not on file  Other Topics Concern   Not on file  Social History Narrative   Not on file   Social Drivers of Health   Tobacco Use: Medium Risk (09/27/2024)   Patient History    Smoking Tobacco Use: Former    Smokeless Tobacco Use: Never    Passive Exposure: Not on Actuary Strain: Not on file  Food Insecurity: Patient Declined (05/04/2024)   Epic    Worried About Programme Researcher, Broadcasting/film/video in the Last Year: Patient declined    Barista in the Last Year: Patient declined  Transportation Needs: Patient Declined (05/04/2024)   Epic    Lack of Transportation (Medical): Patient declined    Lack of Transportation (Non-Medical): Patient declined  Physical Activity: Not on file  Stress: Not on file  Social Connections: Patient Declined (05/04/2024)   Social Connection and Isolation Panel    Frequency of Communication with Friends and Family: Patient declined    Frequency of Social Gatherings with Friends and Family: Patient declined    Attends Religious Services: Patient declined    Active Member of Clubs or Organizations: Patient declined    Attends  Banker Meetings: Patient declined    Marital Status: Patient declined  Intimate Partner Violence: Patient Declined (05/04/2024)   Epic    Fear of Current or Ex-Partner: Patient declined    Emotionally Abused: Patient declined    Physically Abused: Patient declined    Sexually Abused: Patient declined  Depression (PHQ2-9): Low Risk (08/30/2024)   Depression (PHQ2-9)    PHQ-2 Score: 0  Alcohol Screen: Not on file  Housing: Unknown (05/04/2024)   Epic    Unable to Pay for Housing in the Last Year: Patient declined    Number of Times Moved in the Last Year: 0    Homeless in the Last Year: Patient declined  Utilities: Patient Declined (05/04/2024)   Epic    Threatened with loss of utilities: Patient declined  Health Literacy: Not on file     PHYSICAL EXAM  Vitals:   09/27/24 1052  Weight: 180 lb 8 oz (81.9 kg)  Height: 5' (1.524 m)    Body mass index is 35.25 kg/m.   General: The patient is well-developed and well-nourished and in no acute distress.   She has Lymphedema and legs are wrapped.  We removed left wrapping for exam.      Neurologic Exam  Mental status: The patient is alert and oriented x 3 at the time of the examination. The patient has apparent normal recent and remote memory, with an  apparently normal attention span and concentration ability.   Speech is normal.  Cranial nerves: Extraocular muscles are intact. Facial strength and sensation is normal.  Hearing is symmetric.    Motor:  Mild essential tremor in hands.  Muscle bulk is normal.   Tone is normal. Strength is normal in the arms. Strength is 4+/5 in the foot and ankle extensors  Sensory: She has very reduced sensation to vibration at the ankles and absent at toes  There is reduced sensation to touch from the ankles down.  Coordination: Cerebellar testing reveals good finger-nose-finger bilaterally.  Heel-to-shin is poor.  Gait and station: Station is normal.  She has a wide gait but walks  without her Rollator.   She cannot tandem  With a walker she has a fairly good-sized stride and good speed.  Romberg is mildly positive.  Reflexes: Deep tendon reflexes are 1+ in arms and absent in knees and ankles.         ASSESSMENT AND PLAN    1. Polyneuropathy   2. Restless leg   3. OSA on CPAP   4. End-stage renal disease on hemodialysis (HCC)   5. Gait disturbance     1. Continue the current dose of gabapentin  and ropinirole .  We discussed that these medications are renally cleared so I am unlikely to increase the dose further.  She also takes tramadol  for the neuropathic pain.  If she has severe restless leg syndrome she will take an oxycodone  (just once or twice a week)  2.   She will continue CPAP +10cm .  She has benefited from CPAP for the treatment of her severe OSA (AHI = 44.7 in February 2015 PSG).      We discussed trying to increase to >4 hours use 3.   Try to exercise regularly.    4.    return in 6 months or sooner if there are new or worsening neurologic symptoms.  This visit is part of a comprehensive longitudinal care medical relationship regarding the patients primary diagnosis of polyneuropathy, gait disturnce, RLS and OSA and related concerns.   Brithney Bensen A. Vear, MD, PhD, DIEDRA  09/27/2024, 12:13 PM Certified in Neurology, Clinical Neurophysiology, Sleep Medicine, Pain Medicine and Neuroimaging  [facility] [address] [place], [place] [address] [phone] "

## 2024-10-15 ENCOUNTER — Telehealth: Payer: Self-pay | Admitting: Cardiology

## 2024-10-15 MED ORDER — AMIODARONE HCL 200 MG PO TABS: 200.0000 mg | ORAL_TABLET | Freq: Two times a day (BID) | ORAL | 1 refills | Status: AC

## 2024-10-15 NOTE — Telephone Encounter (Signed)
 Patient needed refill of her amiodarone . Sent to requested pharmacy   Whitney FABIENE Louder, PA-C 10/15/2024 2:45 PM

## 2025-02-28 ENCOUNTER — Inpatient Hospital Stay

## 2025-02-28 ENCOUNTER — Inpatient Hospital Stay: Admitting: Medical Oncology

## 2025-05-11 ENCOUNTER — Ambulatory Visit: Admitting: Physician Assistant

## 2025-05-11 ENCOUNTER — Other Ambulatory Visit (HOSPITAL_COMMUNITY)

## 2025-05-30 ENCOUNTER — Ambulatory Visit: Admitting: Neurology
